# Patient Record
Sex: Female | Born: 1945
Health system: Southern US, Community
[De-identification: ages and names within clinical notes are randomized; demographics above are authoritative.]

## PROBLEM LIST (undated history)

## (undated) DIAGNOSIS — M797 Fibromyalgia: Secondary | ICD-10-CM

## (undated) DIAGNOSIS — C4491 Basal cell carcinoma of skin, unspecified: Secondary | ICD-10-CM

## (undated) DIAGNOSIS — H269 Unspecified cataract: Secondary | ICD-10-CM

## (undated) DIAGNOSIS — I351 Nonrheumatic aortic (valve) insufficiency: Secondary | ICD-10-CM

## (undated) DIAGNOSIS — F32A Depression, unspecified: Secondary | ICD-10-CM

## (undated) DIAGNOSIS — H3530 Unspecified macular degeneration: Secondary | ICD-10-CM

## (undated) DIAGNOSIS — K5790 Diverticulosis of intestine, part unspecified, without perforation or abscess without bleeding: Secondary | ICD-10-CM

## (undated) DIAGNOSIS — F419 Anxiety disorder, unspecified: Secondary | ICD-10-CM

## (undated) DIAGNOSIS — F329 Major depressive disorder, single episode, unspecified: Secondary | ICD-10-CM

## (undated) DIAGNOSIS — T7840XA Allergy, unspecified, initial encounter: Secondary | ICD-10-CM

## (undated) DIAGNOSIS — I639 Cerebral infarction, unspecified: Secondary | ICD-10-CM

## (undated) DIAGNOSIS — M1990 Unspecified osteoarthritis, unspecified site: Secondary | ICD-10-CM

## (undated) DIAGNOSIS — I10 Essential (primary) hypertension: Secondary | ICD-10-CM

## (undated) DIAGNOSIS — C439 Malignant melanoma of skin, unspecified: Secondary | ICD-10-CM

## (undated) DIAGNOSIS — E079 Disorder of thyroid, unspecified: Secondary | ICD-10-CM

## (undated) DIAGNOSIS — IMO0001 Reserved for inherently not codable concepts without codable children: Secondary | ICD-10-CM

## (undated) DIAGNOSIS — D126 Benign neoplasm of colon, unspecified: Secondary | ICD-10-CM

## (undated) DIAGNOSIS — K219 Gastro-esophageal reflux disease without esophagitis: Secondary | ICD-10-CM

## (undated) DIAGNOSIS — R011 Cardiac murmur, unspecified: Secondary | ICD-10-CM

## (undated) DIAGNOSIS — E785 Hyperlipidemia, unspecified: Secondary | ICD-10-CM

## (undated) HISTORY — DX: Benign neoplasm of colon, unspecified: D12.6

## (undated) HISTORY — DX: Unspecified macular degeneration: H35.30

## (undated) HISTORY — DX: Nonrheumatic aortic (valve) insufficiency: I35.1

## (undated) HISTORY — PX: EYE SURGERY: SHX253

## (undated) HISTORY — DX: Malignant melanoma of skin, unspecified: C43.9

## (undated) HISTORY — DX: Reserved for inherently not codable concepts without codable children: IMO0001

## (undated) HISTORY — PX: COLONOSCOPY: SHX174

## (undated) HISTORY — PX: NASAL SEPTUM SURGERY: SHX37

## (undated) HISTORY — DX: Unspecified osteoarthritis, unspecified site: M19.90

## (undated) HISTORY — PX: APPENDECTOMY: SHX54

## (undated) HISTORY — DX: Cardiac murmur, unspecified: R01.1

## (undated) HISTORY — PX: MELANOMA EXCISION: SHX5266

## (undated) HISTORY — DX: Cerebral infarction, unspecified: I63.9

## (undated) HISTORY — PX: LASIK: SHX215

## (undated) HISTORY — DX: Basal cell carcinoma of skin, unspecified: C44.91

## (undated) HISTORY — DX: Major depressive disorder, single episode, unspecified: F32.9

## (undated) HISTORY — DX: Diverticulosis of intestine, part unspecified, without perforation or abscess without bleeding: K57.90

## (undated) HISTORY — DX: Hyperlipidemia, unspecified: E78.5

## (undated) HISTORY — PX: BASAL CELL CARCINOMA EXCISION: SHX1214

## (undated) HISTORY — DX: Unspecified cataract: H26.9

## (undated) HISTORY — DX: Essential (primary) hypertension: I10

## (undated) HISTORY — PX: TUBAL LIGATION: SHX77

## (undated) HISTORY — DX: Gastro-esophageal reflux disease without esophagitis: K21.9

## (undated) HISTORY — DX: Disorder of thyroid, unspecified: E07.9

## (undated) HISTORY — PX: CHOLECYSTECTOMY: SHX55

## (undated) HISTORY — DX: Fibromyalgia: M79.7

## (undated) HISTORY — DX: Allergy, unspecified, initial encounter: T78.40XA

## (undated) HISTORY — PX: CARDIAC CATHETERIZATION: SHX172

## (undated) HISTORY — PX: ABDOMINAL HYSTERECTOMY: SHX81

## (undated) HISTORY — DX: Anxiety disorder, unspecified: F41.9

## (undated) HISTORY — DX: Depression, unspecified: F32.A

---

## 2006-02-13 DIAGNOSIS — I712 Thoracic aortic aneurysm, without rupture: Secondary | ICD-10-CM | POA: Insufficient documentation

## 2006-09-11 ENCOUNTER — Emergency Department (HOSPITAL_COMMUNITY): Admission: EM | Admit: 2006-09-11 | Discharge: 2006-09-12 | Payer: Self-pay | Admitting: Emergency Medicine

## 2006-11-29 ENCOUNTER — Ambulatory Visit: Payer: Self-pay | Admitting: Internal Medicine

## 2006-11-29 LAB — CONVERTED CEMR LAB
T3, Free: 3.1 pg/mL (ref 2.3–4.2)
TSH: 0.53 microintl units/mL (ref 0.35–5.50)

## 2006-12-12 ENCOUNTER — Encounter: Payer: Self-pay | Admitting: Cardiology

## 2006-12-12 ENCOUNTER — Encounter: Payer: Self-pay | Admitting: Internal Medicine

## 2006-12-12 ENCOUNTER — Ambulatory Visit: Payer: Self-pay

## 2006-12-19 ENCOUNTER — Ambulatory Visit: Payer: Self-pay | Admitting: Gastroenterology

## 2006-12-30 ENCOUNTER — Ambulatory Visit: Payer: Self-pay | Admitting: Internal Medicine

## 2007-04-30 ENCOUNTER — Ambulatory Visit: Payer: Self-pay | Admitting: Internal Medicine

## 2007-04-30 DIAGNOSIS — I10 Essential (primary) hypertension: Secondary | ICD-10-CM | POA: Insufficient documentation

## 2007-04-30 DIAGNOSIS — E039 Hypothyroidism, unspecified: Secondary | ICD-10-CM | POA: Insufficient documentation

## 2007-04-30 LAB — CONVERTED CEMR LAB
T3 Uptake Ratio: 40.6 % — ABNORMAL HIGH (ref 22.5–37.0)
TSH: 0.04 microintl units/mL — ABNORMAL LOW (ref 0.35–5.50)

## 2007-07-03 ENCOUNTER — Ambulatory Visit: Payer: Self-pay | Admitting: Internal Medicine

## 2007-07-03 DIAGNOSIS — B370 Candidal stomatitis: Secondary | ICD-10-CM | POA: Insufficient documentation

## 2007-07-03 DIAGNOSIS — R5381 Other malaise: Secondary | ICD-10-CM | POA: Insufficient documentation

## 2007-07-03 DIAGNOSIS — R5383 Other fatigue: Secondary | ICD-10-CM | POA: Insufficient documentation

## 2007-07-03 DIAGNOSIS — F329 Major depressive disorder, single episode, unspecified: Secondary | ICD-10-CM | POA: Insufficient documentation

## 2007-07-03 LAB — CONVERTED CEMR LAB
Folate: 7.1 ng/mL
HCT: 38.8 % (ref 36.0–46.0)
Hemoglobin: 13.1 g/dL (ref 12.0–15.0)
Lymphocytes Relative: 13.9 % (ref 12.0–46.0)
MCHC: 33.6 g/dL (ref 30.0–36.0)
Monocytes Absolute: 0.3 10*3/uL (ref 0.2–0.7)
Neutro Abs: 9.9 10*3/uL — ABNORMAL HIGH (ref 1.4–7.7)
RBC: 4.77 M/uL (ref 3.87–5.11)
Vitamin B-12: 352 pg/mL (ref 211–911)
WBC: 12.1 10*3/uL — ABNORMAL HIGH (ref 4.5–10.5)

## 2007-07-07 ENCOUNTER — Telehealth: Payer: Self-pay | Admitting: *Deleted

## 2007-07-10 ENCOUNTER — Ambulatory Visit: Payer: Self-pay | Admitting: Internal Medicine

## 2007-07-10 DIAGNOSIS — D510 Vitamin B12 deficiency anemia due to intrinsic factor deficiency: Secondary | ICD-10-CM | POA: Insufficient documentation

## 2007-08-25 ENCOUNTER — Encounter: Payer: Self-pay | Admitting: Internal Medicine

## 2008-01-05 ENCOUNTER — Ambulatory Visit: Payer: Self-pay | Admitting: Vascular Surgery

## 2008-01-05 ENCOUNTER — Encounter: Payer: Self-pay | Admitting: Family Medicine

## 2008-01-05 ENCOUNTER — Ambulatory Visit (HOSPITAL_COMMUNITY): Admission: RE | Admit: 2008-01-05 | Discharge: 2008-01-05 | Payer: Self-pay | Admitting: Family Medicine

## 2008-01-17 ENCOUNTER — Encounter: Payer: Self-pay | Admitting: Family Medicine

## 2008-01-17 ENCOUNTER — Ambulatory Visit (HOSPITAL_COMMUNITY): Admission: RE | Admit: 2008-01-17 | Discharge: 2008-01-17 | Payer: Self-pay | Admitting: Family Medicine

## 2008-01-20 ENCOUNTER — Ambulatory Visit (HOSPITAL_COMMUNITY): Admission: RE | Admit: 2008-01-20 | Discharge: 2008-01-20 | Payer: Self-pay | Admitting: Orthopedic Surgery

## 2008-02-10 ENCOUNTER — Encounter (INDEPENDENT_AMBULATORY_CARE_PROVIDER_SITE_OTHER): Payer: Self-pay | Admitting: Orthopedic Surgery

## 2008-02-10 ENCOUNTER — Ambulatory Visit: Payer: Self-pay | Admitting: Surgery

## 2008-02-10 ENCOUNTER — Ambulatory Visit: Admission: RE | Admit: 2008-02-10 | Discharge: 2008-02-10 | Payer: Self-pay | Admitting: Orthopedic Surgery

## 2008-02-20 ENCOUNTER — Encounter: Admission: RE | Admit: 2008-02-20 | Discharge: 2008-02-20 | Payer: Self-pay | Admitting: Rheumatology

## 2009-02-14 ENCOUNTER — Encounter: Payer: Self-pay | Admitting: Cardiovascular Disease

## 2009-02-14 ENCOUNTER — Ambulatory Visit: Payer: Self-pay | Admitting: Cardiovascular Disease

## 2009-02-14 DIAGNOSIS — R9431 Abnormal electrocardiogram [ECG] [EKG]: Secondary | ICD-10-CM | POA: Insufficient documentation

## 2009-02-14 DIAGNOSIS — I359 Nonrheumatic aortic valve disorder, unspecified: Secondary | ICD-10-CM | POA: Insufficient documentation

## 2009-02-14 LAB — CONVERTED CEMR LAB
CO2: 31 meq/L (ref 19–32)
Calcium: 9.1 mg/dL (ref 8.4–10.5)
Chloride: 105 meq/L (ref 96–112)
Creatinine, Ser: 0.7 mg/dL (ref 0.4–1.2)
Glucose, Bld: 89 mg/dL (ref 70–99)

## 2009-02-22 ENCOUNTER — Ambulatory Visit: Payer: Self-pay | Admitting: Cardiovascular Disease

## 2009-02-22 ENCOUNTER — Ambulatory Visit: Payer: Self-pay

## 2009-02-22 ENCOUNTER — Ambulatory Visit (HOSPITAL_COMMUNITY): Admission: RE | Admit: 2009-02-22 | Discharge: 2009-02-22 | Payer: Self-pay | Admitting: Cardiovascular Disease

## 2009-02-22 ENCOUNTER — Encounter: Payer: Self-pay | Admitting: Cardiovascular Disease

## 2009-11-24 ENCOUNTER — Encounter: Payer: Self-pay | Admitting: Internal Medicine

## 2009-12-29 ENCOUNTER — Encounter (INDEPENDENT_AMBULATORY_CARE_PROVIDER_SITE_OTHER): Payer: Self-pay | Admitting: *Deleted

## 2010-03-07 ENCOUNTER — Encounter: Payer: Self-pay | Admitting: Internal Medicine

## 2010-03-21 ENCOUNTER — Ambulatory Visit: Payer: Self-pay | Admitting: Cardiovascular Disease

## 2010-04-03 ENCOUNTER — Encounter: Admission: RE | Admit: 2010-04-03 | Discharge: 2010-04-03 | Payer: Self-pay | Admitting: Family Medicine

## 2010-04-26 ENCOUNTER — Encounter: Admission: RE | Admit: 2010-04-26 | Discharge: 2010-04-26 | Payer: Self-pay | Admitting: Family Medicine

## 2010-05-12 ENCOUNTER — Ambulatory Visit (HOSPITAL_COMMUNITY): Admission: RE | Admit: 2010-05-12 | Discharge: 2010-05-12 | Payer: Self-pay | Admitting: Family Medicine

## 2010-05-12 ENCOUNTER — Encounter: Payer: Self-pay | Admitting: Cardiovascular Disease

## 2010-05-17 ENCOUNTER — Telehealth: Payer: Self-pay | Admitting: Cardiovascular Disease

## 2010-09-27 ENCOUNTER — Encounter: Payer: Self-pay | Admitting: Cardiovascular Disease

## 2010-10-04 ENCOUNTER — Encounter (INDEPENDENT_AMBULATORY_CARE_PROVIDER_SITE_OTHER): Payer: Self-pay | Admitting: *Deleted

## 2010-10-04 ENCOUNTER — Telehealth: Payer: Self-pay | Admitting: Cardiovascular Disease

## 2010-10-29 DIAGNOSIS — D126 Benign neoplasm of colon, unspecified: Secondary | ICD-10-CM

## 2010-10-29 HISTORY — DX: Benign neoplasm of colon, unspecified: D12.6

## 2010-11-28 NOTE — Assessment & Plan Note (Signed)
Summary: E4V   Primary Provider:  Dr. Dallas Schimke  CC:  dizziness.  History of Present Illness: Katherine Wang is seen today for F/U of HTN, AV disease and mild thoracic aortic root dilatation.  MRI done 4/10 showed thoracic aorta around 4 cm.  There was a ? of bicuspid valve but she had 3 leaflets on MRI.;  She has mild AR.  Her BP seems to be running high.  She just got a BP cuff at home and will check it regularly.  She takes Diovan 160mg  and hets dizzy and lightheaded on 320mg .  She denies SSCP, palpitations, dyspnea or TIA.  She is compliant with her meds.  I told her if her BP runs high over the next 3 weeks at home we could add another medicine.  I would probably choose Norvasc 10 mg.  Otherwise she will not need another echo or MRA until 2012  Current Problems (verified): 1)  Aortic Insufficiency  (ICD-424.1) 2)  Electrocardiogram, Abnormal  (ICD-794.31) 3)  Pernicious Anemia  (ICD-281.0) 4)  Depression  (ICD-311) 5)  Malaise and Fatigue  (ICD-780.79) 6)  Candidiasis, Oral  (ICD-112.0) 7)  Aneurysm, Thoracic Aortic  (ICD-441.2) 8)  Hypothyroidism  (ICD-244.9) 9)  Hypertension  (ICD-401.9)  Current Medications (verified): 1)  Diovan 160 Mg Tabs (Valsartan) .... Take 1 By Mouth Once Daily 2)  Klonopin 0.5 Mg  Tabs (Clonazepam) .... At Bedtime 3)  Ocuvite Adult Formula   Caps (Multiple Vitamins-Minerals) .... Once Daily 4)  Zoloft 100 Mg  Tabs (Sertraline Hcl) .... 2 Tabs By Mouth Once Daily 5)  Zyrtec-D Allergy & Congestion 5-120 Mg Xr12h-Tab (Cetirizine-Pseudoephedrine) 6)  Synthroid 100 Mcg Tabs (Levothyroxine Sodium) .Marland Kitchen.. 1  Tab By Mouth Once Daily 7)  Hydroxychloroquine Sulfate 200 Mg Tabs (Hydroxychloroquine Sulfate) .... Take 1 By Mouth Two Times A Day 8)  Folic Acid 1 Mg Tabs (Folic Acid) .... Take 2 By Mouth Once Daily 9)  Crestor 10 Mg Tabs (Rosuvastatin Calcium) .... Take 1 By Mouth Once Daily 10)  Methotrexate Sodium 25 Mg/ml Soln (Methotrexate Sodium) .... Inject 0.8 Ml Under Skin  Every Week 11)  Proair Hfa 108 (90 Base) Mcg/act Aers (Albuterol Sulfate) .... As Needed 12)  Bupropion Hcl 100 Mg Tabs (Bupropion Hcl) .Marland Kitchen.. 1 Tab By Mouth Once Daily  Allergies (verified): No Known Drug Allergies  Past History:  Past Medical History: Last updated: 02/10/2009 Current Problems:  PERNICIOUS ANEMIA (ICD-281.0) DEPRESSION (ICD-311) MALAISE AND FATIGUE (ICD-780.79) CANDIDIASIS, ORAL (ICD-112.0) ANEURYSM, THORACIC AORTIC (ICD-441.2) HYPOTHYROIDISM (ICD-244.9) HYPERTENSION (ICD-401.9)  Past Surgical History: Last updated: 04/30/2007 Hysterectomy  Family History: Last updated: 02/10/2009 noncontributory  Social History: Last updated: 02/14/2009 Divorced.  Lives alone 2 daughters living one local one in [place].  Son died of brain aneurysm at age 32 Never Smoked Non-drinker Primary CAre Dr. now Dallas Schimke at urgent care  Review of Systems       Denies fever, malais, weight loss, blurry vision, decreased visual acuity, cough, sputum, SOB, hemoptysis, pleuritic pain, palpitaitons, heartburn, abdominal pain, melena, lower extremity edema, claudication, or rash.   Vital Signs:  Patient profile:   65 year old female Height:      61 inches Weight:      143 pounds BMI:     27.12 Pulse rate:   80 / minute Resp:     14 per minute BP sitting:   160 / 90  (left arm)  Vitals Entered By: Kem Parkinson (Mar 21, 2010 3:09 PM)  Physical Exam  General:  Affect  appropriate Healthy:  appears stated age HEENT: normal Neck supple with no adenopathy JVP normal no bruits no thyromegaly Lungs clear with no wheezing and good diaphragmatic motion Heart:  S1/S2 no murmur,rub, gallop or click PMI normal Abdomen: benighn, BS positve, no tenderness, no AAA no bruit.  No HSM or HJR Distal pulses intact with no bruits No edema Neuro non-focal Skin warm and dry    Impression & Recommendations:  Problem # 1:  AORTIC INSUFFICIENCY (ICD-424.1) No diastolic murmur on  exam.  Consider echo in a year.  No SBE prophylaxis Her updated medication list for this problem includes:    Diovan 160 Mg Tabs (Valsartan) .Marland Kitchen... Take 1 by mouth once daily  Problem # 2:  ANEURYSM, THORACIC AORTIC (ICD-441.2) Only 4.0 cm with no bicuspid valve.  F/U MRA in a year  Problem # 3:  HYPERTENSION (ICD-401.9) Will likely need addition of norvasc.  Alternative would be to stop Diovan and try Cozaar 100mg  plus/minus diuretic if she wants just one pill She will let us know what her home readings are Her updated medication list for this problem includes:    Diovan 160 Mg Tabs (Valsartan) .Marland Kitchen... Take 1 by mouth once daily  Problem # 4:  ELECTROCARDIOGRAM, ABNORMAL (ICD-794.31) PVC on ECG benign.  Nonspecific ST/T wave changes unchanged and likely reflect HTN  Patient Instructions: 1)  Your physician recommends that you schedule a follow-up appointment in: ONE YEAR   EKG Report  Procedure date:  03/21/2010  Findings:      NSR 80 Inferolateral ST/T wave changes Abnormal ECG

## 2010-11-28 NOTE — Letter (Signed)
Summary: [facility]  [facility]   Imported By: Maryln Gottron 03/23/2010 13:51:17  _____________________________________________________________________  External Attachment:    Type:   Image     Comment:   External Document

## 2010-11-28 NOTE — Progress Notes (Signed)
Summary: surgical clearance  Phone Note Other Incoming   Caller: nurse robin Summary of Call: Per Zella Ball pt needs clearance for surgery letter was sent but needs more information also needs old ekg. [phone] fax [phone] Initial call taken by: Edman Circle,  October 04, 2010 10:38 AM  Follow-up for Phone Call        Left message to call back Deliah Goody, RN  October 04, 2010 2:54 PM  spoke with robin, she request clearence letter and old EKG. sent to fax number provided. Deliah Goody, RN  October 04, 2010 4:24 PM

## 2010-11-28 NOTE — Letter (Signed)
Summary: Clearance Letter  [facility], Main Office  [address]   [place], [place] [address]   Phone: [phone]  Fax: [phone]    October 04, 2010  Re:     Crimora Cellar Address:   [address]     [place], [place]  [address] DOB:     Mar 06, 1946 MRN:     [id number]   Dear Dr Petra Kuba,       [name] is low risk cardiac wise for surgery. Please call with any questions or concerns.    Sincerely,  Katherine Goody, RN/Dr Charlton Haws

## 2010-11-28 NOTE — Letter (Signed)
Summary: [facility]  [facility]   Imported By: Marylou Mccoy 10/06/2010 17:50:24  _____________________________________________________________________  External Attachment:    Type:   Image     Comment:   External Document

## 2010-11-28 NOTE — Letter (Signed)
Summary: [facility]  [facility]   Imported By: Lanelle Bal 12/06/2009 11:50:34  _____________________________________________________________________  External Attachment:    Type:   Image     Comment:   External Document

## 2010-11-28 NOTE — Letter (Signed)
Summary: Appointment - Reminder 2  [facility], Main Office  [address]   [place], [place] [address]   Phone: [phone]  Fax: [phone]     December 29, 2009 MRN: [id number]   Katherine Wang [address] [place], [place]  [address]   Dear Ms. [name],  Our records indicate that it is time to schedule a follow-up appointment with Dr. Eden Emms. It is very important that we reach you to schedule this appointment. We look forward to participating in your health care needs. Please contact us at the number listed above at your earliest convenience to schedule your appointment.  If you are unable to make an appointment at this time, give [place] a call so we can update our records.     Sincerely,   Migdalia Dk [facility] Scheduling Team

## 2010-11-28 NOTE — Progress Notes (Signed)
Summary: returning call  Phone Note Call from Patient   Caller: Patient Reason for Call: Talk to Nurse Summary of Call: pt returning call -pls call [phone] Initial call taken by: Glynda Jaeger,  May 17, 2010 9:39 AM  Follow-up for Phone Call        Phone Call Completed PT AWARE OF CT RESULTS. Follow-up by: Scherrie Bateman, LPN,  May 17, 2010 11:55 AM

## 2011-01-15 ENCOUNTER — Encounter (INDEPENDENT_AMBULATORY_CARE_PROVIDER_SITE_OTHER): Payer: Self-pay | Admitting: *Deleted

## 2011-01-25 NOTE — Letter (Signed)
Summary: Pre Visit Letter Revised  [facility]  [address] [place], [place] [address]   Phone: [phone]  Fax: [phone]        01/15/2011 MRN: [id number] Katherine Wang [address] [place], [place]  [address]             Procedure Date:  02-28-11           Direct Colon---Dr. Russella Dar  Welcome to the Gastroenterology Division at [facility]    You are scheduled to see a nurse for your pre-procedure visit on 02-15-11 at 9:00a.m. on the 3rd floor at [facility], [address]  We ask that you try to arrive at our office 15 minutes prior to your appointment time to allow for check-in.  Please take a minute to review the attached form.  If you answer "Yes" to one or more of the questions on the first page, we ask that you call the person listed at your earliest opportunity.  If you answer "No" to all of the questions, please complete the rest of the form and bring it to your appointment.    Your nurse visit will consist of discussing your medical and surgical history, your immediate family medical history, and your medications.   If you are unable to list all of your medications on the form, please bring the medication bottles to your appointment and we will list them.  We will need to be aware of both prescribed and over the counter drugs.  We will need to know exact dosage information as well.    Please be prepared to read and sign documents such as consent forms, a financial agreement, and acknowledgement forms.  If necessary, and with your consent, a friend or relative is welcome to sit-in on the nurse visit with you.  Please bring your insurance card so that we may make a copy of it.  If your insurance requires a referral to see a specialist, please bring your referral form from your primary care physician.  No co-pay is required for this nurse visit.     If you cannot keep your appointment, please call [phone] to cancel or reschedule prior to your  appointment date.  This allows [place] the opportunity to schedule an appointment for another patient in need of care.    Thank you for choosing [facility] for your medical needs.  We appreciate the opportunity to care for you.  Please visit [place] at our website  to learn more about our practice.  Sincerely, The Gastroenterology Division

## 2011-02-15 ENCOUNTER — Ambulatory Visit (AMBULATORY_SURGERY_CENTER): Payer: Medicare Other | Admitting: *Deleted

## 2011-02-15 VITALS — Ht 61.0 in | Wt 147.0 lb

## 2011-02-15 DIAGNOSIS — Z1211 Encounter for screening for malignant neoplasm of colon: Secondary | ICD-10-CM

## 2011-02-15 MED ORDER — PEG-KCL-NACL-NASULF-NA ASC-C 100 G PO SOLR: 1.0000 | Freq: Once | ORAL | Status: AC

## 2011-02-28 ENCOUNTER — Encounter: Payer: Self-pay | Admitting: Gastroenterology

## 2011-02-28 ENCOUNTER — Ambulatory Visit (AMBULATORY_SURGERY_CENTER): Payer: Medicare Other | Admitting: Gastroenterology

## 2011-02-28 DIAGNOSIS — R198 Other specified symptoms and signs involving the digestive system and abdomen: Secondary | ICD-10-CM

## 2011-02-28 DIAGNOSIS — D126 Benign neoplasm of colon, unspecified: Secondary | ICD-10-CM

## 2011-02-28 DIAGNOSIS — Z1211 Encounter for screening for malignant neoplasm of colon: Secondary | ICD-10-CM

## 2011-02-28 DIAGNOSIS — K5730 Diverticulosis of large intestine without perforation or abscess without bleeding: Secondary | ICD-10-CM

## 2011-02-28 MED ORDER — SODIUM CHLORIDE 0.9 % IV SOLN: 500.0000 mL | INTRAVENOUS | Status: DC

## 2011-02-28 NOTE — Patient Instructions (Signed)
Information on polyps, diverticulosis, high fiber diet given. Resume all medications.

## 2011-03-01 ENCOUNTER — Telehealth: Payer: Self-pay

## 2011-03-01 NOTE — Telephone Encounter (Signed)
I called hm # and got the answering machine but it had no ID.  No message left.  MAW

## 2011-03-05 ENCOUNTER — Encounter: Payer: Self-pay | Admitting: Gastroenterology

## 2011-04-19 ENCOUNTER — Telehealth: Payer: Self-pay | Admitting: Cardiology

## 2011-04-19 NOTE — Telephone Encounter (Signed)
I talked with pt. I have given pt an appointment with Dr Shirlee Latch 04/24/11 at 8:45am.

## 2011-04-19 NOTE — Telephone Encounter (Signed)
That is fine.  Sounds like she needs to be seen soon.  Please have her come in in the next week or so.

## 2011-04-19 NOTE — Telephone Encounter (Signed)
Spoke with pt,  The pts primary care md, dr Shanda Bumps [place], suggested dr Shirlee Latch to her. She did not realize that mclean and nishan were in the same practice. She is having trouble with pain throughout her chest when she lies on her right side and occ will get the discomfort while sitting. She would like to change to dr Shirlee Latch if okay with him. Will forward for dr mclean's review Deliah Goody

## 2011-04-19 NOTE — Telephone Encounter (Signed)
Pt would like to changed service to see dr. Shirlee Latch.

## 2011-04-24 ENCOUNTER — Ambulatory Visit (INDEPENDENT_AMBULATORY_CARE_PROVIDER_SITE_OTHER): Payer: Medicare Other | Admitting: Cardiology

## 2011-04-24 ENCOUNTER — Encounter: Payer: Self-pay | Admitting: Cardiology

## 2011-04-24 DIAGNOSIS — R0602 Shortness of breath: Secondary | ICD-10-CM

## 2011-04-24 DIAGNOSIS — I359 Nonrheumatic aortic valve disorder, unspecified: Secondary | ICD-10-CM

## 2011-04-24 DIAGNOSIS — I10 Essential (primary) hypertension: Secondary | ICD-10-CM

## 2011-04-24 DIAGNOSIS — I319 Disease of pericardium, unspecified: Secondary | ICD-10-CM

## 2011-04-24 DIAGNOSIS — R079 Chest pain, unspecified: Secondary | ICD-10-CM

## 2011-04-24 DIAGNOSIS — I712 Thoracic aortic aneurysm, without rupture: Secondary | ICD-10-CM

## 2011-04-24 LAB — BASIC METABOLIC PANEL
CO2: 28 mEq/L (ref 19–32)
Chloride: 106 mEq/L (ref 96–112)
Potassium: 4.8 mEq/L (ref 3.5–5.1)

## 2011-04-24 LAB — SEDIMENTATION RATE: Sed Rate: 83 mm/hr — ABNORMAL HIGH (ref 0–22)

## 2011-04-24 MED ORDER — POTASSIUM CHLORIDE CRYS ER 20 MEQ PO TBCR: 20.0000 meq | EXTENDED_RELEASE_TABLET | Freq: Two times a day (BID) | ORAL | Status: DC

## 2011-04-24 MED ORDER — VALSARTAN 160 MG PO TABS: 160.0000 mg | ORAL_TABLET | Freq: Every day | ORAL | Status: DC

## 2011-04-24 MED ORDER — IBUPROFEN 400 MG PO TABS: ORAL_TABLET | ORAL | Status: DC

## 2011-04-24 MED ORDER — COLCHICINE 0.6 MG PO TABS: ORAL_TABLET | ORAL | Status: DC

## 2011-04-24 MED ORDER — CHLORTHALIDONE 25 MG PO TABS: ORAL_TABLET | ORAL | Status: DC

## 2011-04-24 MED ORDER — OMEPRAZOLE 20 MG PO CPDR: DELAYED_RELEASE_CAPSULE | ORAL | Status: DC

## 2011-04-24 NOTE — Assessment & Plan Note (Signed)
Getting echo for workup of pericarditis.  Will reassess aortic valve pathology.  Mild AI in the past.   Followup in office in 2 wks.

## 2011-04-24 NOTE — Patient Instructions (Addendum)
Start Chlorthalidone 12.5mg  daily--this will be one-half of a 25mg  tablet.  Start KCL(potassium) 20 mEq daily.  Start Colchicine 0.6mg  daily for 3 months.  Start Ibuprofen 400mg  three times a day for 2 weeks.  Take Omeprazole 20mg  daily for 2 weeks while you are taking Ibuprofen.  Lab to day--BMP/BNP/Sed rate  423.9  786.50  401.9  Schedule an appointment for an echocardiogram 423.9  Schedule an appointment to see Dr Shirlee Latch in 2 weeks..  Lab in 2 weeks--BMP 786.50  423.9

## 2011-04-24 NOTE — Assessment & Plan Note (Signed)
Mildly dilated ascending aorta on 7/11 CTA chest.  Would repeat in 7/13 with MR angiogram.  Need good BP control to decrease risk of progression.  

## 2011-04-24 NOTE — Assessment & Plan Note (Signed)
Patient has had chest pain for several weeks that is both positional and pleuritic.  It is not present constantly.  She has a history prior acute pericarditis.  Rheumatoid arthritis can certainly predispose her to pericarditis.  I will check ESR and BNP.  I will start her on Ibuprofen 400 mg tid x 2 weeks.  She will take omeprazole 20 mg daily while taking Ibuprofen.  I will also have her take colchicine 0.6 mg daily x 3 months given possible recurrent acute pericarditis.  I will get an echocardiogram to look for pericardial effusion, etc.

## 2011-04-24 NOTE — Assessment & Plan Note (Signed)
BP is running high.  She has had trouble with increasing valsartan in the past.  Instead, I will have her add chlorthalidone 12.5 mg daily + KCl 20 mEq daily with BMET in 2 weeks.

## 2011-04-24 NOTE — Progress Notes (Signed)
PCP: Dr. Shanda Bumps [place]  65 yo with history of rheumatoid arthritis, aortic insufficiency, mild ascending aortic dilatation, and possible prior acute pericarditis presents for evaluation of chest pain.  I am seeing her for the first time today.  She has been seen by Dr. Eden Emms in the past.  Patient's cardiac history begins in 2004 when she had possible acute pericarditis in [place].  LHC at that time showed no significant disease.  In our office, she has been followed for a malformed (though trileaflet) aortic valve.  She has had mild aortic insufficiency and a mildly dilated ascending aorta.    For the last month, patient has noted that her chest hurts when she lies down on her right side.  Sometimes the pain will occur when she sits upright. The pain is also worse with deep breathing.  It does seem somewhat like prior pericarditis pain.  It has actually improved some over the past few days but has not resolved.  No exertional chest pain.  Patient has had long-standing exertional dyspnea.  She is short of breath walking up steps or rolling the garbage can to the street.  This pattern has not changed.    BP is high today and has been running high at home.  Patient states that last time her valsartan was increased, she got very lightheaded.    ECG: NSR, < 1 mm inferior ST depression, < 1 mm anterior ST depression (similar to prior)  PMH: 1. Acute pericarditis in 2004: Hospitalized in [place].  Had LHC at that time that she reports as showing no significant CAD.  2. 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  Last echo (4/10) showed EF 55-60%, mild LV dilation, mild LV hypertrophy, mild aortic insufficiency, mild MR.  Cardiac MRI (4/10) confirmed that the aortic valve was trileaflet but malformed, EF 64%.  3.  Ascending aorta dilation: MRA chest (4/10) with 4 x 3.6 cm ascending aorta.  CTA chest (7/11) with 3.7 x 3.7 cm ascending aorta and aberrant right subclavian  course.  4.  HTN 5.  Rheumatoid arthritis  6.  H/o rheumatic fever in childhood.   SH: Lives alone, single.  Divorced, 2 daughters.  Works a Armed forces training and education officer.  Quit smoking in the 1980s.    FH: Mother with angina.   Father with MI at 53.    ROS: All systems reviewed and negative except as per HPI.   Current Outpatient Prescriptions  Medication Sig Dispense Refill  . buPROPion (WELLBUTRIN) 100 MG tablet Take 100 mg by mouth daily.        . Calcium-Vitamin D (CALTRATE 600 PLUS-VIT D PO) Take 1 capsule by mouth daily.        . cetirizine-pseudoephedrine (ZYRTEC-D) 5-120 MG per tablet Take 1 tablet by mouth as needed.        . clonazePAM (KLONOPIN) 0.5 MG tablet Take 0.5 mg by mouth 2 (two) times daily as needed.        . folic acid (FOLVITE) 1 MG tablet Take 2 mg by mouth daily.       . hydroxychloroquine (PLAQUENIL) 200 MG tablet Take 200 mg by mouth 2 (two) times daily.        Marland Kitchen levothyroxine (SYNTHROID, LEVOTHROID) 112 MCG tablet Take 112 mcg by mouth daily.        . methotrexate (RHEUMATREX) 2.5 MG tablet Take 2.5 mg by mouth. Caution:Chemotherapy. Protect from light.  8 tablets once weekly       .  Multiple Vitamins-Minerals (OCUVITE ADULT FORMULA PO) Take 1 tablet by mouth.        . naproxen sodium (ANAPROX) 220 MG tablet Take 220 mg by mouth as needed.        . rosuvastatin (CRESTOR) 10 MG tablet Take 10 mg by mouth daily.        . sertraline (ZOLOFT) 100 MG tablet Take 200 mg by mouth daily.        . valsartan (DIOVAN) 160 MG tablet Take 1 tablet (160 mg total) by mouth daily.  30 tablet  6  . DISCONTD: valsartan (DIOVAN) 160 MG tablet Take 160 mg by mouth daily.        . chlorthalidone (HYGROTON) 25 MG tablet Take 1/2 tablet daily  15 tablet  6  . colchicine 0.6 MG tablet Take one daily for 3 months  30 tablet  2  . ibuprofen (ADVIL,MOTRIN) 400 MG tablet Take one tablet three times a day for 2 weeks  42 tablet  0  . omeprazole (PRILOSEC) 20 MG capsule Take one daily for 2 weeks  while you are taking Ibuprofen  14 capsule  0  . potassium chloride SA (KLOR-CON M20) 20 MEQ tablet Take 1 tablet (20 mEq total) by mouth 2 (two) times daily.  30 tablet  6  . DISCONTD: clonazePAM (KLONOPIN) 1 MG tablet Take 1 mg by mouth daily.        Marland Kitchen DISCONTD: methotrexate 1 G injection Inject 0.6 mg into the muscle once a week.        Marland Kitchen DISCONTD: MOVIPREP 100 G SOLR        Current Facility-Administered Medications  Medication Dose Route Frequency Provider Last Rate Last Dose  . 0.9 %  sodium chloride infusion  500 mL Intravenous Continuous Meryl Dare, MD,FACG        BP 142/97  Pulse 86  Ht 5\' 1"  (1.549 m)  Wt 146 lb (66.225 kg)  BMI 27.59 kg/m2 General: NAD Neck: No JVD, no thyromegaly or thyroid nodule.  Lungs: Clear to auscultation bilaterally with normal respiratory effort. CV: Nondisplaced PMI.  Heart regular S1/S2, no S3/S4, no murmur.  No peripheral edema.  No carotid bruit.  Normal pedal pulses.  Abdomen: Soft, nontender, no hepatosplenomegaly, no distention.  Skin: Intact without lesions or rashes.  Neurologic: Alert and oriented x 3.  Psych: Normal affect. Extremities: No clubbing or cyanosis.  HEENT: Normal.

## 2011-04-25 ENCOUNTER — Ambulatory Visit (HOSPITAL_COMMUNITY): Payer: Medicare Other | Attending: Cardiology | Admitting: Radiology

## 2011-04-25 DIAGNOSIS — R5383 Other fatigue: Secondary | ICD-10-CM | POA: Insufficient documentation

## 2011-04-25 DIAGNOSIS — R5381 Other malaise: Secondary | ICD-10-CM | POA: Insufficient documentation

## 2011-04-25 DIAGNOSIS — I080 Rheumatic disorders of both mitral and aortic valves: Secondary | ICD-10-CM | POA: Insufficient documentation

## 2011-04-25 DIAGNOSIS — I319 Disease of pericardium, unspecified: Secondary | ICD-10-CM

## 2011-04-25 DIAGNOSIS — R079 Chest pain, unspecified: Secondary | ICD-10-CM | POA: Insufficient documentation

## 2011-04-25 DIAGNOSIS — I059 Rheumatic mitral valve disease, unspecified: Secondary | ICD-10-CM

## 2011-04-25 DIAGNOSIS — I079 Rheumatic tricuspid valve disease, unspecified: Secondary | ICD-10-CM | POA: Insufficient documentation

## 2011-04-25 DIAGNOSIS — I10 Essential (primary) hypertension: Secondary | ICD-10-CM | POA: Insufficient documentation

## 2011-04-25 DIAGNOSIS — I359 Nonrheumatic aortic valve disorder, unspecified: Secondary | ICD-10-CM

## 2011-04-26 ENCOUNTER — Encounter: Payer: Self-pay | Admitting: *Deleted

## 2011-04-27 ENCOUNTER — Telehealth: Payer: Self-pay | Admitting: *Deleted

## 2011-04-27 NOTE — Telephone Encounter (Signed)
Pt returning call from [place] pt unsure of exactly who called her. Pt said person calling wanted to discuss echo with pt.

## 2011-04-27 NOTE — Telephone Encounter (Signed)
I talked with pt about recent echo results. 

## 2011-05-08 ENCOUNTER — Encounter: Payer: Self-pay | Admitting: Cardiology

## 2011-05-08 ENCOUNTER — Ambulatory Visit (INDEPENDENT_AMBULATORY_CARE_PROVIDER_SITE_OTHER): Payer: Medicare Other | Admitting: Cardiology

## 2011-05-08 ENCOUNTER — Other Ambulatory Visit (INDEPENDENT_AMBULATORY_CARE_PROVIDER_SITE_OTHER): Payer: Medicare Other | Admitting: *Deleted

## 2011-05-08 VITALS — BP 113/78 | HR 81 | Resp 16 | Ht 60.0 in | Wt 147.0 lb

## 2011-05-08 DIAGNOSIS — I712 Thoracic aortic aneurysm, without rupture, unspecified: Secondary | ICD-10-CM

## 2011-05-08 DIAGNOSIS — I10 Essential (primary) hypertension: Secondary | ICD-10-CM

## 2011-05-08 DIAGNOSIS — I359 Nonrheumatic aortic valve disorder, unspecified: Secondary | ICD-10-CM

## 2011-05-08 DIAGNOSIS — IMO0001 Reserved for inherently not codable concepts without codable children: Secondary | ICD-10-CM

## 2011-05-08 DIAGNOSIS — I319 Disease of pericardium, unspecified: Secondary | ICD-10-CM

## 2011-05-08 DIAGNOSIS — R079 Chest pain, unspecified: Secondary | ICD-10-CM

## 2011-05-08 LAB — BASIC METABOLIC PANEL
BUN: 15 mg/dL (ref 6–23)
CO2: 28 mEq/L (ref 19–32)
Chloride: 103 mEq/L (ref 96–112)
Creatinine, Ser: 0.9 mg/dL (ref 0.4–1.2)
GFR: 68.98 mL/min (ref >60.00)
Glucose, Bld: 91 mg/dL (ref 70–99)
Potassium: 4 mEq/L (ref 3.5–5.1)
Sodium: 136 mEq/L (ref 135–145)

## 2011-05-08 NOTE — Patient Instructions (Addendum)
Lab today---BMP 401.89  423.9  Your physician wants you to follow-up in: 6 months with Dr Shirlee Latch. (January 2013). You will receive a reminder letter in the mail two months in advance. If you don't receive a letter, please call our office to schedule the follow-up appointment.   MRA of chest in January 2013. Have this a few days before the appointment with Dr Shirlee Latch in January 2013.

## 2011-05-08 NOTE — Assessment & Plan Note (Signed)
Patient may have had recurrent acute pericarditis around the time of the last appointment.  Chest pain has now completely resolved.  No pericardial effusion on echo.  I will have her complete 3 months of colchicine.

## 2011-05-08 NOTE — Assessment & Plan Note (Signed)
Mildly dilated ascending aorta on 7/11 CTA chest.  Would repeat in 7/13 with MR angiogram.  Need good BP control to decrease risk of progression.

## 2011-05-08 NOTE — Assessment & Plan Note (Signed)
Echo 6/12 with mild aortic insufficiency.

## 2011-05-08 NOTE — Progress Notes (Signed)
PCP: Dr. Shanda Bumps Copland  65 yo with history of rheumatoid arthritis, aortic insufficiency, mild ascending aortic dilatation, and suspected prior acute pericarditis returns for evaluation of chest pain.  Patient's cardiac history begins in 2004 when she had possible acute pericarditis in [place].  LHC at that time showed no significant disease.  In our office, she has been followed for a malformed (though trileaflet) aortic valve.  She has had mild aortic insufficiency and a mildly dilated ascending aorta.  At last appointment, she was thought to possibly have symptoms of recurrent pericarditis.  ESR was significantly elevated though this could be due to her rheumatoid arthritis.    At last appointment, I started her on Ibuprofen and colchicine.  The Ibuprofen upset her stomach despite use of omeprazole so she stopped it.  She is still taking the colchicine.  The chest pain has resolved completely.  Patient has had long-standing exertional dyspnea.  She is short of breath walking up steps or rolling the garbage can to the street.  This pattern has not changed.  BP is under control now.   Finally, patient is thinking about moving to [place] to live nearer to her daughter.    Labs (6/12): BNP 86, ESR 83  PMH: 1. Acute pericarditis in 2004: Hospitalized in [place].  Had LHC at that time that she reports as showing no significant CAD.  Possible recurrent pericarditis 6/12.  2. 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  Last echo (4/10) showed EF 55-60%, mild LV dilation, mild LV hypertrophy, mild aortic insufficiency, mild MR.  Cardiac MRI (4/10) confirmed that the aortic valve was trileaflet but malformed, EF 64%. Echo (6/12): EF 50%, moderate (grade II) diastolic dysfunction, mild AI, mild MR, no pericardial effusion.   3.  Ascending aorta dilation: MRA chest (4/10) with 4 x 3.6 cm ascending aorta.  CTA chest (7/11) with 3.7 x 3.7 cm ascending aorta and aberrant right  subclavian course.  4.  HTN 5.  Rheumatoid arthritis  6.  H/o rheumatic fever in childhood.   SH: Lives alone, single.  Divorced, 2 daughters.  Works a Armed forces training and education officer.  Quit smoking in the 1980s.    FH: Mother with angina.   Father with MI at 13.    Current Outpatient Prescriptions  Medication Sig Dispense Refill  . buPROPion (WELLBUTRIN) 100 MG tablet Take 100 mg by mouth daily.        . Calcium-Vitamin D (CALTRATE 600 PLUS-VIT D PO) Take 1 capsule by mouth daily.        . cetirizine-pseudoephedrine (ZYRTEC-D) 5-120 MG per tablet Take 1 tablet by mouth as needed.        . chlorthalidone (HYGROTON) 25 MG tablet Take 1/2 tablet daily  15 tablet  6  . clonazePAM (KLONOPIN) 0.5 MG tablet Take 0.5 mg by mouth 2 (two) times daily as needed.        . colchicine 0.6 MG tablet Take one daily for 3 months  30 tablet  2  . folic acid (FOLVITE) 1 MG tablet Take 2 mg by mouth daily.       . hydroxychloroquine (PLAQUENIL) 200 MG tablet Take 200 mg by mouth 2 (two) times daily.        Marland Kitchen levothyroxine (SYNTHROID, LEVOTHROID) 112 MCG tablet Take 112 mcg by mouth daily.        . methotrexate (RHEUMATREX) 2.5 MG tablet Take 2.5 mg by mouth. Caution:Chemotherapy. Protect from light.  8 tablets once weekly       .  naproxen sodium (ANAPROX) 220 MG tablet Take 220 mg by mouth as needed.        . potassium chloride SA (K-DUR,KLOR-CON) 20 MEQ tablet Take 20 mEq by mouth daily.        . rosuvastatin (CRESTOR) 10 MG tablet Take 10 mg by mouth daily.        . sertraline (ZOLOFT) 100 MG tablet Take 200 mg by mouth daily.        . valsartan (DIOVAN) 160 MG tablet Take 1 tablet (160 mg total) by mouth daily.  30 tablet  6  . DISCONTD: ibuprofen (ADVIL,MOTRIN) 400 MG tablet Take one tablet three times a day for 2 weeks  42 tablet  0  . DISCONTD: Multiple Vitamins-Minerals (OCUVITE ADULT FORMULA PO) Take 1 tablet by mouth.        . DISCONTD: omeprazole (PRILOSEC) 20 MG capsule Take one daily for 2 weeks while you  are taking Ibuprofen  14 capsule  0  . DISCONTD: potassium chloride SA (KLOR-CON M20) 20 MEQ tablet Take 1 tablet (20 mEq total) by mouth 2 (two) times daily.  30 tablet  6   Current Facility-Administered Medications  Medication Dose Route Frequency Provider Last Rate Last Dose  . 0.9 %  sodium chloride infusion  500 mL Intravenous Continuous Meryl Dare, MD,FACG        BP 113/78  Pulse 81  Resp 16  Ht 5' (1.524 m)  Wt 147 lb (66.679 kg)  BMI 28.71 kg/m2 General: NAD Neck: No JVD, no thyromegaly or thyroid nodule.  Lungs: Clear to auscultation bilaterally with normal respiratory effort. CV: Nondisplaced PMI.  Heart regular S1/S2, no S3/S4, no murmur.  No peripheral edema.  No carotid bruit.  Normal pedal pulses.  Abdomen: Soft, nontender, no hepatosplenomegaly, no distention.  Skin: Intact without lesions or rashes.  Neurologic: Alert and oriented x 3.  Psych: Normal affect. Extremities: No clubbing or cyanosis.  HEENT: Normal.

## 2011-05-08 NOTE — Assessment & Plan Note (Signed)
BP is now under good control.  Continue current meds.

## 2011-06-18 NOTE — Progress Notes (Signed)
Addended by: Maple Hudson on: 06/18/2011 05:21 PM   Modules accepted: Orders, Level of Service

## 2013-10-30 DIAGNOSIS — M069 Rheumatoid arthritis, unspecified: Secondary | ICD-10-CM | POA: Diagnosis not present

## 2013-10-30 DIAGNOSIS — Z79899 Other long term (current) drug therapy: Secondary | ICD-10-CM | POA: Diagnosis not present

## 2013-12-25 DIAGNOSIS — M069 Rheumatoid arthritis, unspecified: Secondary | ICD-10-CM | POA: Diagnosis not present

## 2013-12-25 DIAGNOSIS — Z79899 Other long term (current) drug therapy: Secondary | ICD-10-CM | POA: Diagnosis not present

## 2014-01-12 DIAGNOSIS — L578 Other skin changes due to chronic exposure to nonionizing radiation: Secondary | ICD-10-CM | POA: Diagnosis not present

## 2014-01-12 DIAGNOSIS — Z85828 Personal history of other malignant neoplasm of skin: Secondary | ICD-10-CM | POA: Diagnosis not present

## 2014-01-12 DIAGNOSIS — Z85820 Personal history of malignant melanoma of skin: Secondary | ICD-10-CM | POA: Diagnosis not present

## 2014-01-13 DIAGNOSIS — H269 Unspecified cataract: Secondary | ICD-10-CM | POA: Diagnosis not present

## 2014-01-13 DIAGNOSIS — H04129 Dry eye syndrome of unspecified lacrimal gland: Secondary | ICD-10-CM | POA: Diagnosis not present

## 2014-01-13 DIAGNOSIS — H3530 Unspecified macular degeneration: Secondary | ICD-10-CM | POA: Diagnosis not present

## 2014-01-19 DIAGNOSIS — J329 Chronic sinusitis, unspecified: Secondary | ICD-10-CM | POA: Diagnosis not present

## 2014-01-19 DIAGNOSIS — J069 Acute upper respiratory infection, unspecified: Secondary | ICD-10-CM | POA: Diagnosis not present

## 2014-02-04 ENCOUNTER — Encounter: Payer: Self-pay | Admitting: Gastroenterology

## 2014-02-25 DIAGNOSIS — Z79899 Other long term (current) drug therapy: Secondary | ICD-10-CM | POA: Diagnosis not present

## 2014-02-25 DIAGNOSIS — M069 Rheumatoid arthritis, unspecified: Secondary | ICD-10-CM | POA: Diagnosis not present

## 2014-03-26 DIAGNOSIS — I10 Essential (primary) hypertension: Secondary | ICD-10-CM | POA: Diagnosis not present

## 2014-03-26 DIAGNOSIS — F329 Major depressive disorder, single episode, unspecified: Secondary | ICD-10-CM | POA: Diagnosis not present

## 2014-03-26 DIAGNOSIS — E7800 Pure hypercholesterolemia, unspecified: Secondary | ICD-10-CM | POA: Diagnosis not present

## 2014-03-26 DIAGNOSIS — F3289 Other specified depressive episodes: Secondary | ICD-10-CM | POA: Diagnosis not present

## 2014-04-19 DIAGNOSIS — H353190 Nonexudative age-related macular degeneration, unspecified eye, stage unspecified: Secondary | ICD-10-CM | POA: Diagnosis not present

## 2014-04-26 DIAGNOSIS — Z79899 Other long term (current) drug therapy: Secondary | ICD-10-CM | POA: Diagnosis not present

## 2014-04-26 DIAGNOSIS — M069 Rheumatoid arthritis, unspecified: Secondary | ICD-10-CM | POA: Diagnosis not present

## 2014-06-21 DIAGNOSIS — Z79899 Other long term (current) drug therapy: Secondary | ICD-10-CM | POA: Diagnosis not present

## 2014-06-21 DIAGNOSIS — M069 Rheumatoid arthritis, unspecified: Secondary | ICD-10-CM | POA: Diagnosis not present

## 2014-07-13 DIAGNOSIS — Z85828 Personal history of other malignant neoplasm of skin: Secondary | ICD-10-CM | POA: Diagnosis not present

## 2014-07-13 DIAGNOSIS — L578 Other skin changes due to chronic exposure to nonionizing radiation: Secondary | ICD-10-CM | POA: Diagnosis not present

## 2014-07-13 DIAGNOSIS — Z85820 Personal history of malignant melanoma of skin: Secondary | ICD-10-CM | POA: Diagnosis not present

## 2014-07-14 DIAGNOSIS — R7309 Other abnormal glucose: Secondary | ICD-10-CM | POA: Diagnosis not present

## 2014-07-14 DIAGNOSIS — E7800 Pure hypercholesterolemia, unspecified: Secondary | ICD-10-CM | POA: Diagnosis not present

## 2014-07-14 DIAGNOSIS — E039 Hypothyroidism, unspecified: Secondary | ICD-10-CM | POA: Diagnosis not present

## 2014-08-10 DIAGNOSIS — F419 Anxiety disorder, unspecified: Secondary | ICD-10-CM | POA: Diagnosis not present

## 2014-08-10 DIAGNOSIS — J40 Bronchitis, not specified as acute or chronic: Secondary | ICD-10-CM | POA: Diagnosis not present

## 2014-08-16 DIAGNOSIS — Z79899 Other long term (current) drug therapy: Secondary | ICD-10-CM | POA: Diagnosis not present

## 2014-08-16 DIAGNOSIS — M0609 Rheumatoid arthritis without rheumatoid factor, multiple sites: Secondary | ICD-10-CM | POA: Diagnosis not present

## 2014-08-20 DIAGNOSIS — Z23 Encounter for immunization: Secondary | ICD-10-CM | POA: Diagnosis not present

## 2014-09-01 ENCOUNTER — Ambulatory Visit (INDEPENDENT_AMBULATORY_CARE_PROVIDER_SITE_OTHER): Payer: Medicare Other | Admitting: Family Medicine

## 2014-09-01 VITALS — BP 155/80 | HR 91 | Temp 98.6°F | Resp 16 | Ht 60.0 in | Wt 155.0 lb

## 2014-09-01 DIAGNOSIS — E876 Hypokalemia: Secondary | ICD-10-CM | POA: Diagnosis not present

## 2014-09-01 DIAGNOSIS — E038 Other specified hypothyroidism: Secondary | ICD-10-CM

## 2014-09-01 DIAGNOSIS — M069 Rheumatoid arthritis, unspecified: Secondary | ICD-10-CM | POA: Insufficient documentation

## 2014-09-01 DIAGNOSIS — I351 Nonrheumatic aortic (valve) insufficiency: Secondary | ICD-10-CM

## 2014-09-01 DIAGNOSIS — Z23 Encounter for immunization: Secondary | ICD-10-CM

## 2014-09-01 NOTE — Patient Instructions (Signed)
We will get your referrals to Dr. Estanislado Pandy and Dr. Aundra Dubin started.  Let me know if you do not hear about these appointments.   You got your prevnar 13 vaccine today.  I think you should have a pneumovax in one year and then you are likely fully immunized against pneumonia.   I will be in touch with your labs asap

## 2014-09-01 NOTE — Progress Notes (Signed)
[facility] [address] [place] [address] [phone]  Date:  09/01/2014   Name:  Katherine Wang   DOB:  1945/12/21   MRN:  [id number]  PCP:  Lamar Blinks, MD    Chief Complaint: Establish Care   History of Present Illness:  Katherine Wang is a 68 y.o. very pleasant female patient who presents with the following:  Here today to re-establish care.  She had been in [place] for a few years but is now back in [place].  She was in [place] to be closer to her daughter and special needs grandchild.  She is not entirely happy to be back and [place] and misses her grandchildren but did not elaborate about her reasons for coming back to [facility].  She did well with her chronic illnesses while she was gone.  She is now on infusions of remicade for her RA, as well as methotrexate and folic acid.  She is working on getting her rheum to send her records here so she can re-establish with Dr. Estanislado Pandy.    Her TSH has not been checked in several months  She is also a former pt of Dr. Aundra Dubin for her cardiac issues and would like to re-establish with him as well   Patient Active Problem List   Diagnosis Date Noted  . Pericarditis 04/24/2011  . AORTIC INSUFFICIENCY 02/14/2009  . ELECTROCARDIOGRAM, ABNORMAL 02/14/2009  . PERNICIOUS ANEMIA 07/10/2007  . CANDIDIASIS, ORAL 07/03/2007  . DEPRESSION 07/03/2007  . MALAISE AND FATIGUE 07/03/2007  . HYPOTHYROIDISM 04/30/2007  . HYPERTENSION 04/30/2007  . ANEURYSM, THORACIC AORTIC 02/13/2006    Past Medical History  Diagnosis Date  . Allergy   . Hyperlipidemia   . Thyroid disease   . Arthritis   . Anxiety   . Depression   . Hypertension   . Aortic insufficiency   . Murmur, heart   . Stroke     per pt. mild stroke  . Cancer   . Cataract   . Macular degeneration     Past Surgical History  Procedure Laterality Date  . Cholecystectomy    . Colonoscopy      30 + years ago, unsure of where  . Abdominal hysterectomy     . Lasik    . Cardiac catheterization    . Nasal septum surgery    . Melanoma excision    . Appendectomy    . Eye surgery    . Tubal ligation      History  Substance Use Topics  . Smoking status: Former Research scientist (life sciences)  . Smokeless tobacco: Never Used  . Alcohol Use: No    Family History  Problem Relation Age of Onset  . Heart disease Mother   . Hypertension Mother   . Mental illness Mother   . Heart disease Father   . Hypertension Father   . Mental illness Father   . Hyperlipidemia Sister   . Hypertension Sister   . Hypertension Sister   . Diabetes Sister     No Known Allergies  Medication list has been reviewed and updated.  Current Outpatient Prescriptions on File Prior to Visit  Medication Sig Dispense Refill  . buPROPion (WELLBUTRIN) 100 MG tablet Take 100 mg by mouth daily.      . chlorthalidone (HYGROTON) 25 MG tablet Take 1/2 tablet daily 15 tablet 6  . clonazePAM (KLONOPIN) 0.5 MG tablet Take 0.5 mg by mouth 2 (two) times daily as needed.      Marland Kitchen  folic acid (FOLVITE) 1 MG tablet Take 2 mg by mouth daily.     Marland Kitchen levothyroxine (SYNTHROID, LEVOTHROID) 112 MCG tablet Take 100 mcg by mouth daily.     . methotrexate (RHEUMATREX) 2.5 MG tablet Take 2.5 mg by mouth. Caution:Chemotherapy. Protect from light.  6 tablets once weekly    . rosuvastatin (CRESTOR) 10 MG tablet Take 10 mg by mouth daily.      . sertraline (ZOLOFT) 100 MG tablet Take 200 mg by mouth daily.      . valsartan (DIOVAN) 160 MG tablet Take 1 tablet (160 mg total) by mouth daily. 30 tablet 6  . Calcium-Vitamin D (CALTRATE 600 PLUS-VIT D PO) Take 1 capsule by mouth daily.      . cetirizine-pseudoephedrine (ZYRTEC-D) 5-120 MG per tablet Take 1 tablet by mouth as needed.      . colchicine 0.6 MG tablet Take one daily for 3 months 30 tablet 2  . hydroxychloroquine (PLAQUENIL) 200 MG tablet Take 200 mg by mouth 2 (two) times daily.      . naproxen sodium (ANAPROX) 220 MG tablet Take 220 mg by mouth as needed.       . potassium chloride SA (K-DUR,KLOR-CON) 20 MEQ tablet Take 20 mEq by mouth daily.       No current facility-administered medications on file prior to visit.    Review of Systems:  As per HPI- otherwise negative.   Physical Examination: Filed Vitals:   09/01/14 1449  BP: 162/84  Pulse: 91  Temp: 98.6 F (37 C)  Resp: 16   Filed Vitals:   09/01/14 1449  Height: 5' (1.524 m)  Weight: 155 lb (70.308 kg)   Body mass index is 30.27 kg/(m^2). Ideal Body Weight: Weight in (lb) to have BMI = 25: 127.7  GEN: WDWN, NAD, Non-toxic, A & O x 3, overweight, looks well HEENT: Atraumatic, Normocephalic. Neck supple. No masses, No LAD. Ears and Nose: No external deformity. CV: RRR, No M/G/R. No JVD. No thrill. No extra heart sounds. PULM: CTA B, no wheezes, crackles, rhonchi. No retractions. No resp. distress. No accessory muscle use. EXTR: No c/c/e NEURO Normal gait.  PSYCH: Normally interactive. Conversant. Not depressed or anxious appearing.  Calm demeanor.    Assessment and Plan: Rheumatoid arthritis - Plan: Ambulatory referral to Rheumatology  Aortic insufficiency - Plan: Ambulatory referral to Cardiology  Immunization due - Plan: Pneumococcal conjugate vaccine 13-valent IM  Hypokalemia - Plan: Basic metabolic panel  Other specified hypothyroidism - Plan: TSH  She had been started on kdur 19meg daily for hypokalemia nearly a month ago- will recheck today Also recheck her TSH as her dose was adjusted to 192mcg a month or two ago Referrals pending for rheum and cardiology prevnar 13 today   Signed Lamar Blinks, MD

## 2014-09-02 ENCOUNTER — Encounter: Payer: Self-pay | Admitting: Family Medicine

## 2014-09-02 LAB — BASIC METABOLIC PANEL
BUN: 17 mg/dL (ref 6–23)
CO2: 24 meq/L (ref 19–32)
Calcium: 9.8 mg/dL (ref 8.4–10.5)
Chloride: 99 mEq/L (ref 96–112)
Creat: 0.94 mg/dL (ref 0.50–1.10)
Glucose, Bld: 61 mg/dL — ABNORMAL LOW (ref 70–99)
POTASSIUM: 3.8 meq/L (ref 3.5–5.3)
SODIUM: 135 meq/L (ref 135–145)

## 2014-09-02 LAB — TSH: TSH: 0.686 u[IU]/mL (ref 0.350–4.500)

## 2014-09-16 DIAGNOSIS — Z111 Encounter for screening for respiratory tuberculosis: Secondary | ICD-10-CM | POA: Diagnosis not present

## 2014-09-16 DIAGNOSIS — Z79899 Other long term (current) drug therapy: Secondary | ICD-10-CM | POA: Diagnosis not present

## 2014-09-16 DIAGNOSIS — R5381 Other malaise: Secondary | ICD-10-CM | POA: Diagnosis not present

## 2014-09-16 DIAGNOSIS — M79642 Pain in left hand: Secondary | ICD-10-CM | POA: Diagnosis not present

## 2014-09-16 DIAGNOSIS — M79672 Pain in left foot: Secondary | ICD-10-CM | POA: Diagnosis not present

## 2014-09-16 DIAGNOSIS — R300 Dysuria: Secondary | ICD-10-CM | POA: Diagnosis not present

## 2014-09-16 DIAGNOSIS — M79641 Pain in right hand: Secondary | ICD-10-CM | POA: Diagnosis not present

## 2014-09-16 DIAGNOSIS — M0579 Rheumatoid arthritis with rheumatoid factor of multiple sites without organ or systems involvement: Secondary | ICD-10-CM | POA: Diagnosis not present

## 2014-09-16 DIAGNOSIS — Z113 Encounter for screening for infections with a predominantly sexual mode of transmission: Secondary | ICD-10-CM | POA: Diagnosis not present

## 2014-09-16 DIAGNOSIS — M79671 Pain in right foot: Secondary | ICD-10-CM | POA: Diagnosis not present

## 2014-09-16 DIAGNOSIS — M25561 Pain in right knee: Secondary | ICD-10-CM | POA: Diagnosis not present

## 2014-09-16 DIAGNOSIS — M2550 Pain in unspecified joint: Secondary | ICD-10-CM | POA: Diagnosis not present

## 2014-09-16 DIAGNOSIS — M25562 Pain in left knee: Secondary | ICD-10-CM | POA: Diagnosis not present

## 2014-09-17 ENCOUNTER — Other Ambulatory Visit: Payer: Self-pay

## 2014-09-17 MED ORDER — FLUTICASONE FUROATE 27.5 MCG/SPRAY NA SUSP: 2.0000 | Freq: Every day | NASAL | Status: DC

## 2014-09-17 NOTE — Telephone Encounter (Signed)
Pt called in and states she seen Dr Lorelei Pont 11-4 and she was suppose to call in a script for Fluticasone nasal spray. [facility] [place]. Thank you

## 2014-09-17 NOTE — Telephone Encounter (Signed)
Please advise- medication has been pended.

## 2014-10-11 ENCOUNTER — Institutional Professional Consult (permissible substitution): Payer: Medicare Other | Admitting: Cardiology

## 2014-10-23 ENCOUNTER — Other Ambulatory Visit: Payer: Self-pay | Admitting: Family Medicine

## 2014-10-23 ENCOUNTER — Telehealth: Payer: Self-pay

## 2014-10-23 MED ORDER — CLONAZEPAM 0.5 MG PO TABS: 0.5000 mg | ORAL_TABLET | Freq: Two times a day (BID) | ORAL | Status: DC | PRN

## 2014-10-23 NOTE — Telephone Encounter (Signed)
The patient called to request refill of Clonazepam .5 mg.  The patient stated that she didn't realize that she was low since she has been out of town and that she takes one per day.  The patient states she does not have enough to get through to Monday when Dr. Lorelei Pont will be in the office.  Please call the patient at [phone].

## 2014-10-23 NOTE — Telephone Encounter (Signed)
Meds ordered this encounter  Medications  . clonazePAM (KLONOPIN) 0.5 MG tablet    Sig: Take 1 tablet (0.5 mg total) by mouth 2 (two) times daily as needed.    Dispense:  30 tablet    Refill:  0    Order Specific Question:  Supervising Provider    Answer:  DOOLITTLE, ROBERT P [[id number]]

## 2014-10-25 NOTE — Telephone Encounter (Signed)
LMOM that we had sent in RF on 10/23/14.

## 2014-11-15 ENCOUNTER — Encounter: Payer: Self-pay | Admitting: *Deleted

## 2014-11-15 ENCOUNTER — Ambulatory Visit (INDEPENDENT_AMBULATORY_CARE_PROVIDER_SITE_OTHER): Payer: Medicare Other | Admitting: Cardiology

## 2014-11-15 ENCOUNTER — Encounter: Payer: Self-pay | Admitting: Cardiology

## 2014-11-15 VITALS — BP 124/64 | HR 81 | Ht 60.0 in | Wt 151.0 lb

## 2014-11-15 DIAGNOSIS — I712 Thoracic aortic aneurysm, without rupture, unspecified: Secondary | ICD-10-CM

## 2014-11-15 DIAGNOSIS — I359 Nonrheumatic aortic valve disorder, unspecified: Secondary | ICD-10-CM | POA: Diagnosis not present

## 2014-11-15 DIAGNOSIS — E785 Hyperlipidemia, unspecified: Secondary | ICD-10-CM | POA: Insufficient documentation

## 2014-11-15 DIAGNOSIS — I350 Nonrheumatic aortic (valve) stenosis: Secondary | ICD-10-CM | POA: Diagnosis not present

## 2014-11-15 DIAGNOSIS — I77810 Thoracic aortic ectasia: Secondary | ICD-10-CM

## 2014-11-15 NOTE — Progress Notes (Signed)
Patient ID: Katherine Wang, female   DOB: 23-Sep-1946, 69 y.o.   MRN: [id number] PCP: Dr. Janett Billow Copland  69 yo with history of rheumatoid arthritis, aortic insufficiency, mild ascending aortic dilatation, and suspected prior acute pericarditis returns for evaluation of chest pain.  Patient's cardiac history begins in 2004 when she had possible acute pericarditis in [place].  LHC at that time showed no significant disease.  In our office, she has been followed for a malformed (though trileaflet) aortic valve.  She has had mild aortic insufficiency and a mildly dilated ascending aorta.  She moved to [place] in 2012 to help her daughter who has an autistic child.  She is back in [place] now.  She says she had a stress test at some time in 14 in [place].  She thinks it was normal.  No chest pain.  She can walk around the block without dyspnea but is short of breath walking up a flight of steps.  No orthopnea, PND, lightheadedness, or palpitations.  She used to take Crestor but is off it due to expense. BP is controlled.   Labs (6/12): BNP 86, ESR 83 Labs (11/15): K 3.8, creatinine 0.94, TSH normal  PMH: 1. Acute pericarditis in 2004: Hospitalized in [place].  Had LHC at that time that she reports as showing no significant CAD.  Possible recurrent pericarditis 6/12.  2. 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  Last echo (4/10) showed EF 55-60%, mild LV dilation, mild LV hypertrophy, mild aortic insufficiency, mild MR.  Cardiac MRI (4/10) confirmed that the aortic valve was trileaflet but malformed, EF 64%. Echo (6/12): EF 50%, moderate (grade II) diastolic dysfunction, mild AI, mild MR, no pericardial effusion.   3.  Ascending aorta dilation: MRA chest (4/10) with 4 x 3.6 cm ascending aorta.  CTA chest (7/11) with 3.7 x 3.7 cm ascending aorta and aberrant right subclavian course.  4.  HTN 5.  Rheumatoid arthritis  6.  H/o rheumatic fever in childhood.  7.  Hyperlipidemia  SH: Lives alone, single.  Divorced, 2 daughters.  Works a Camera operator.  Quit smoking in the 1980s.    FH: Mother with angina.   Father with MI at 37.    ROS: All systems reviewed and negative except as per HPI.   Current Outpatient Prescriptions  Medication Sig Dispense Refill  . albuterol (PROVENTIL HFA;VENTOLIN HFA) 108 (90 BASE) MCG/ACT inhaler Inhale 2 puffs into the lungs daily as needed for wheezing or shortness of breath.    Marland Kitchen buPROPion (WELLBUTRIN) 100 MG tablet Take 100 mg by mouth daily.      . chlorthalidone (HYGROTON) 25 MG tablet Take 1/2 tablet daily 15 tablet 6  . Cholecalciferol (VITAMIN D-3) 1000 UNITS CAPS Take 1,000 Units by mouth.    . clonazePAM (KLONOPIN) 0.5 MG tablet Take 1 tablet (0.5 mg total) by mouth 2 (two) times daily as needed. 30 tablet 0  . cycloSPORINE (RESTASIS) 0.05 % ophthalmic emulsion 1 drop 2 (two) times daily.    . fluticasone (VERAMYST) 27.5 MCG/SPRAY nasal spray Place 2 sprays into the nose daily. 10 g 5  . levothyroxine (SYNTHROID, LEVOTHROID) 100 MCG tablet Take 100 mcg by mouth daily before breakfast.    . Multiple Vitamins-Minerals (PRESERVISION AREDS PO) Take by mouth.    . naproxen sodium (ANAPROX) 220 MG tablet Take 220 mg by mouth as needed.      . sertraline (ZOLOFT) 100 MG tablet Take 200 mg by mouth daily.      Marland Kitchen  valsartan (DIOVAN) 160 MG tablet Take 1 tablet (160 mg total) by mouth daily. 30 tablet 6  . aspirin EC 81 MG tablet Take 1 tablet (81 mg total) by mouth daily.     No current facility-administered medications for this visit.    BP 124/64 mmHg  Pulse 81  Ht 5' (1.524 m)  Wt 151 lb (68.493 kg)  BMI 29.49 kg/m2 General: NAD Neck: No JVD, no thyromegaly or thyroid nodule.  Lungs: Clear to auscultation bilaterally with normal respiratory effort. CV: Nondisplaced PMI.  Heart regular S1/S2, no S3/M1, 1/6 diastolic murmur RUSB.  No peripheral edema.  No carotid bruit.  Normal pedal pulses.  Abdomen:  Soft, nontender, no hepatosplenomegaly, no distention.  Skin: Intact without lesions or rashes.  Neurologic: Alert and oriented x 3.  Psych: Normal affect. Extremities: No clubbing or cyanosis.  HEENT: Normal.   Assessment/Plan: 1. Aortic valve disorder: Malformed aortic valve with mild AI on 2012 echo.  She has mild exertional symptoms.  I will get an echo to reassess the aortic valve.  2. Ascending aorta dilation: Mild on last MRA in 2012.  I will repeat an MRA chest to reassess.  3. HTN: BP controlled on current meds.  4. Hyperlipidemia: Off Crestor due to expense.  I will check lipids, if LDL high will put her on atorvastatin.  5. Reasonable to take ASA 81 daily.   If echo and MRA are stable, she can followup in 1 year.   Loralie Champagne 11/15/2014

## 2014-11-15 NOTE — Patient Instructions (Signed)
Your physician recommends that you have  lab work today--Lipid profile/BMET.  Your physician has requested that you have an echocardiogram. Echocardiography is a painless test that uses sound waves to create images of your heart. It provides your doctor with information about the size and shape of your heart and how well your heart's chambers and valves are working. This procedure takes approximately one hour. There are no restrictions for this procedure.  Schedule an appointment for an MRA of your chest.  Your physician wants you to follow-up in: 1 year with Dr Aundra Dubin. (January 2017). You will receive a reminder letter in the mail two months in advance. If you don't receive a letter, please call our office to schedule the follow-up appointment.

## 2014-11-16 LAB — BASIC METABOLIC PANEL
BUN: 15 mg/dL (ref 6–23)
CALCIUM: 9.7 mg/dL (ref 8.4–10.5)
CO2: 28 meq/L (ref 19–32)
CREATININE: 1.11 mg/dL (ref 0.40–1.20)
Chloride: 99 mEq/L (ref 96–112)
GFR: 51.54 mL/min — ABNORMAL LOW (ref >60.00)
Glucose, Bld: 97 mg/dL (ref 70–99)
Potassium: 3.5 mEq/L (ref 3.5–5.1)
Sodium: 134 mEq/L — ABNORMAL LOW (ref 135–145)

## 2014-11-16 LAB — LIPID PANEL
CHOLESTEROL: 266 mg/dL — AB (ref 0–200)
HDL: 42.8 mg/dL (ref >39.00)
LDL Cholesterol: 189 mg/dL — ABNORMAL HIGH (ref 0–99)
NonHDL: 223.2
Total CHOL/HDL Ratio: 6
Triglycerides: 169 mg/dL — ABNORMAL HIGH (ref 0.0–149.0)
VLDL: 33.8 mg/dL (ref 0.0–40.0)

## 2014-11-18 ENCOUNTER — Other Ambulatory Visit: Payer: Self-pay

## 2014-11-18 DIAGNOSIS — F411 Generalized anxiety disorder: Secondary | ICD-10-CM

## 2014-11-18 MED ORDER — CLONAZEPAM 0.5 MG PO TABS: 0.5000 mg | ORAL_TABLET | Freq: Two times a day (BID) | ORAL | Status: DC | PRN

## 2014-11-18 NOTE — Telephone Encounter (Signed)
Pt called wanting a prescription refill fr cloonazePAM (KLONOPIN) 0.5 MG tablet [[id number]]. She wants [place] [facility] [place]. I told her she might have to come in and pick it up. Please advise at [phone]

## 2014-11-18 NOTE — Telephone Encounter (Signed)
Dr Lorelei Pont, it looks like [facility] filled this for pt last time, but that you are only provider who has seen pt in [facility]. Please advise.

## 2014-11-22 ENCOUNTER — Other Ambulatory Visit (HOSPITAL_COMMUNITY): Payer: Medicare Other

## 2014-11-22 ENCOUNTER — Other Ambulatory Visit: Payer: Self-pay | Admitting: Family Medicine

## 2014-11-22 ENCOUNTER — Other Ambulatory Visit: Payer: Self-pay | Admitting: *Deleted

## 2014-11-22 DIAGNOSIS — E785 Hyperlipidemia, unspecified: Secondary | ICD-10-CM

## 2014-11-22 MED ORDER — ATORVASTATIN CALCIUM 40 MG PO TABS: 40.0000 mg | ORAL_TABLET | Freq: Every day | ORAL | Status: DC

## 2014-11-26 ENCOUNTER — Other Ambulatory Visit: Payer: Self-pay | Admitting: Family Medicine

## 2014-11-29 ENCOUNTER — Other Ambulatory Visit: Payer: Self-pay | Admitting: Family Medicine

## 2014-11-29 ENCOUNTER — Other Ambulatory Visit: Payer: Self-pay | Admitting: Urgent Care

## 2014-11-29 ENCOUNTER — Other Ambulatory Visit: Payer: Self-pay

## 2014-11-29 DIAGNOSIS — F3289 Other specified depressive episodes: Secondary | ICD-10-CM

## 2014-11-29 DIAGNOSIS — E038 Other specified hypothyroidism: Secondary | ICD-10-CM

## 2014-11-29 MED ORDER — LEVOTHYROXINE SODIUM 100 MCG PO TABS: 100.0000 ug | ORAL_TABLET | Freq: Every day | ORAL | Status: DC

## 2014-11-29 MED ORDER — SERTRALINE HCL 100 MG PO TABS: 200.0000 mg | ORAL_TABLET | Freq: Every day | ORAL | Status: DC

## 2014-11-29 NOTE — Telephone Encounter (Signed)
Please advise since original prescription was not prescribed by this office.

## 2014-11-29 NOTE — Telephone Encounter (Signed)
Patient called to request a refill for Synthroid and Sertraline.  Patient states that a request was also made by [facility] [place]  Please call patient to advise if refills are approved.

## 2014-11-29 NOTE — Telephone Encounter (Signed)
She called back- she has been taking combo of wellbutrin and sertraline for years and is comfortable with risk, it is working well for her

## 2014-11-29 NOTE — Telephone Encounter (Signed)
Patient request refill approval for Synthroid and

## 2014-11-29 NOTE — Telephone Encounter (Signed)
Called and LMOM. I did refills for her- however not sure if she is still taking wellbutrin as well or not.  Please let me know- there is an increased risk of SE when combining sertraline and wellbutrin although these medications certainly can be combined.  Please come and see me in 6 months or so

## 2014-12-02 ENCOUNTER — Ambulatory Visit (HOSPITAL_COMMUNITY): Payer: Medicare Other

## 2014-12-07 ENCOUNTER — Ambulatory Visit (HOSPITAL_COMMUNITY): Payer: Medicare Other | Attending: Cardiology

## 2014-12-07 DIAGNOSIS — I77810 Thoracic aortic ectasia: Secondary | ICD-10-CM

## 2014-12-07 DIAGNOSIS — I350 Nonrheumatic aortic (valve) stenosis: Secondary | ICD-10-CM | POA: Insufficient documentation

## 2014-12-07 DIAGNOSIS — I712 Thoracic aortic aneurysm, without rupture: Secondary | ICD-10-CM | POA: Diagnosis not present

## 2014-12-07 NOTE — Progress Notes (Signed)
2D Echo completed. 12/07/2014

## 2014-12-08 ENCOUNTER — Telehealth: Payer: Self-pay | Admitting: Cardiology

## 2014-12-08 NOTE — Telephone Encounter (Signed)
EF 50-55% is low normal, similar to prior.  She has moderate aortic insufficiency.  This will just need to be followed in the future.  She should still have the MRA as the echo does not show the full thoracic aorta and she has had dilation of the thoracic aorta noted in the past.

## 2014-12-08 NOTE — Telephone Encounter (Signed)
Notified the pt of Dr Katherine Wang recommendations for proceeding with the scheduled MRA for 12/13/14, for this will show the full thoracic aorta and she has had dilation of the thoracic aorta noted in the past.  Informed the pt that he would like to reassess this with the MRA.  Pt verbalized understanding and agrees with this plan.

## 2014-12-08 NOTE — Telephone Encounter (Signed)
New message      Pt said her echo results were good.  Will she still need to have her MRA?

## 2014-12-08 NOTE — Telephone Encounter (Signed)
Pt calling to ask Dr Aundra Dubin and nurse if she still needs to proceed with getting her MRA scheduled for 12/13/14.  Pt states that per Dr Aundra Dubin, she was told if her echo results were normal, then there will be no need to proceed with the MRA.  Pt would like clarification on this, for she states she was told her echo was good with an EF of 50-55 %.  Informed the pt that Dr Aundra Dubin and nurse are both out of the office today, but I will route this message to them for further review and recommendation and follow-up thereafter.  Pt verbalized understanding and agrees with this plan.

## 2014-12-11 ENCOUNTER — Ambulatory Visit (INDEPENDENT_AMBULATORY_CARE_PROVIDER_SITE_OTHER): Payer: Medicare Other

## 2014-12-11 ENCOUNTER — Ambulatory Visit (INDEPENDENT_AMBULATORY_CARE_PROVIDER_SITE_OTHER): Payer: Medicare Other | Admitting: Family Medicine

## 2014-12-11 VITALS — BP 140/72 | HR 79 | Temp 98.1°F | Resp 16 | Ht 60.5 in | Wt 151.1 lb

## 2014-12-11 DIAGNOSIS — F411 Generalized anxiety disorder: Secondary | ICD-10-CM

## 2014-12-11 DIAGNOSIS — R059 Cough, unspecified: Secondary | ICD-10-CM

## 2014-12-11 DIAGNOSIS — R05 Cough: Secondary | ICD-10-CM | POA: Diagnosis not present

## 2014-12-11 MED ORDER — DOXYCYCLINE HYCLATE 100 MG PO CAPS: 100.0000 mg | ORAL_CAPSULE | Freq: Two times a day (BID) | ORAL | Status: DC

## 2014-12-11 MED ORDER — CLONAZEPAM 0.5 MG PO TABS: 0.5000 mg | ORAL_TABLET | Freq: Two times a day (BID) | ORAL | Status: DC | PRN

## 2014-12-11 NOTE — Progress Notes (Signed)
Urgent [facility] and [facility] [address] [place]  [address] [phone]  Date:  12/11/2014   Name:  Katherine Wang   DOB:  [birth year]   MRN:  [id number]  PCP:  Lamar Blinks, MD    Chief Complaint: Sinus Drainage; Sore Throat; and Cough   History of Present Illness:  Katherine Wang is a 69 y.o. very pleasant female patient who presents with the following:  She is here today with concern about illness for about 3 days.  She notes a ST, dry cough, sinus pressure.   She is concerned because it may snow next week so she wanted to come in today "before it goes to my chest."  She has felt warm but did not check her temperature She has not noted any aches or chills.   Last TSH check in November She also needs a refill of her clonazepam- 0.5 mg.  She uses it up to twice a day as needed; she has used this for years.  She is actually taking it just once a day most days- this is a decrease.  Last filled #30 last month and needs a refill.    Patient Active Problem List   Diagnosis Date Noted  . Hyperlipidemia 11/15/2014  . Rheumatoid arthritis 09/01/2014  . Pericarditis 04/24/2011  . Aortic valve disorder 02/14/2009  . ELECTROCARDIOGRAM, ABNORMAL 02/14/2009  . PERNICIOUS ANEMIA 07/10/2007  . CANDIDIASIS, ORAL 07/03/2007  . DEPRESSION 07/03/2007  . MALAISE AND FATIGUE 07/03/2007  . HYPOTHYROIDISM 04/30/2007  . HYPERTENSION 04/30/2007  . Aneurysm of thoracic aorta 02/13/2006    Past Medical History  Diagnosis Date  . Allergy   . Hyperlipidemia   . Thyroid disease   . Arthritis   . Anxiety   . Depression   . Hypertension   . Aortic insufficiency   . Murmur, heart   . Stroke     per pt. mild stroke  . Cancer   . Cataract   . Macular degeneration     Past Surgical History  Procedure Laterality Date  . Cholecystectomy    . Colonoscopy      30 + years ago, unsure of where  . Abdominal hysterectomy    . Lasik    . Cardiac catheterization    . Nasal septum  surgery    . Melanoma excision    . Appendectomy    . Eye surgery    . Tubal ligation      History  Substance Use Topics  . Smoking status: Former Research scientist (life sciences)  . Smokeless tobacco: Never Used  . Alcohol Use: No    Family History  Problem Relation Age of Onset  . Heart disease Mother   . Hypertension Mother   . Mental illness Mother   . Heart disease Father   . Hypertension Father   . Mental illness Father   . Hyperlipidemia Sister   . Hypertension Sister   . Hypertension Sister   . Diabetes Sister     No Known Allergies  Medication list has been reviewed and updated.  Current Outpatient Prescriptions on File Prior to Visit  Medication Sig Dispense Refill  . albuterol (PROVENTIL HFA;VENTOLIN HFA) 108 (90 BASE) MCG/ACT inhaler Inhale 2 puffs into the lungs daily as needed for wheezing or shortness of breath.    Marland Kitchen aspirin EC 81 MG tablet Take 1 tablet (81 mg total) by mouth daily.    Marland Kitchen atorvastatin (LIPITOR) 40 MG tablet Take 1 tablet (40 mg total) by mouth daily.  30 tablet 3  . buPROPion (WELLBUTRIN) 100 MG tablet Take 100 mg by mouth daily.      . chlorthalidone (HYGROTON) 25 MG tablet Take 1/2 tablet daily 15 tablet 6  . Cholecalciferol (VITAMIN D-3) 1000 UNITS CAPS Take 1,000 Units by mouth.    . clonazePAM (KLONOPIN) 0.5 MG tablet Take 1 tablet (0.5 mg total) by mouth 2 (two) times daily as needed. 30 tablet 0  . cycloSPORINE (RESTASIS) 0.05 % ophthalmic emulsion 1 drop 2 (two) times daily.    . fluticasone (VERAMYST) 27.5 MCG/SPRAY nasal spray Place 2 sprays into the nose daily. 10 g 5  . levothyroxine (SYNTHROID, LEVOTHROID) 100 MCG tablet Take 1 tablet (100 mcg total) by mouth daily before breakfast. 30 tablet 5  . Multiple Vitamins-Minerals (PRESERVISION AREDS PO) Take by mouth.    . naproxen sodium (ANAPROX) 220 MG tablet Take 220 mg by mouth as needed.      . sertraline (ZOLOFT) 100 MG tablet Take 2 tablets (200 mg total) by mouth daily. 60 tablet 5  . valsartan  (DIOVAN) 160 MG tablet Take 1 tablet (160 mg total) by mouth daily. 30 tablet 6   No current facility-administered medications on file prior to visit.    Review of Systems:  As per HPI- otherwise negative.   Physical Examination: Filed Vitals:   12/11/14 1304  BP: 140/72  Pulse: 79  Temp: 98.1 F (36.7 C)  Resp: 16   Filed Vitals:   12/11/14 1304  Height: 5' 0.5" (1.537 m)  Weight: 151 lb 2 oz (68.55 kg)   Body mass index is 29.02 kg/(m^2). Ideal Body Weight: Weight in (lb) to have BMI = 25: 129.9  GEN: WDWN, NAD, Non-toxic, A & O x 3, overweight, looks well HEENT: Atraumatic, Normocephalic. Neck supple. No masses, No LAD.  Bilateral TM wnl, oropharynx normal.  PEERL,EOMI.   Ears and Nose: No external deformity. CV: RRR, No M/G/R. No JVD. No thrill. No extra heart sounds. PULM: CTA B, no wheezes, or rhonchi. No retractions. No resp. distress. No accessory muscle use. ABD: S, NT, ND EXTR: No c/c/e NEURO Normal gait.  PSYCH: Normally interactive. Conversant. Not depressed or anxious appearing.  Calm demeanor.  Slight crackle in the right lower lung field  [facility] reading (PRIMARY) by  Dr. Lorelei Pont. CXR: scoliosis, OW negative  CHEST 2 VIEW  COMPARISON: 07/31/2011 and prior radiographs  FINDINGS: The cardiomediastinal silhouette is unchanged with a tortuous thoracic aorta.  There is no evidence of focal airspace disease, pulmonary edema, suspicious pulmonary nodule/mass, pleural effusion, or pneumothorax. No acute bony abnormalities are identified.  No evidence of active cardiopulmonary disease.  IMPRESSION: No active cardiopulmonary disease.   Assessment and Plan: Cough - Plan: DG Chest 2 View, doxycycline (VIBRAMYCIN) 100 MG capsule  GAD (generalized anxiety disorder) - Plan: clonazePAM (KLONOPIN) 0.5 MG tablet, clonazePAM (KLONOPIN) 0.5 MG tablet  Refilled her klonopin- she got #30 less than a month ago but she sometimes takes it BID Reassured that at  this time she does not need to take abx.  However she is concerned about impending bad weather and would like to have something at hand in case of emergency which is reasonable.  Gave a paper rx for doxycycline for her to hold and use only if necessary.  She will use it if she is not getting better over the next few days- Sooner if worse.   She will call me if any questions or concnes   Meds ordered this encounter  Medications  . clonazePAM (KLONOPIN) 0.5 MG tablet    Sig: Take 1 tablet (0.5 mg total) by mouth 2 (two) times daily as needed. Ok to fill on 12/18/14    Dispense:  30 tablet    Refill:  1  . doxycycline (VIBRAMYCIN) 100 MG capsule    Sig: Take 1 capsule (100 mg total) by mouth 2 (two) times daily.    Dispense:  20 capsule    Refill:  0  . clonazePAM (KLONOPIN) 0.5 MG tablet    Sig: Take 1 tablet (0.5 mg total) by mouth 2 (two) times daily as needed.    Dispense:  60 tablet    Refill:  1      Signed Lamar Blinks, MD

## 2014-12-11 NOTE — Patient Instructions (Signed)
At this point I think that you have a viral URI (cold).  Hold onto the antibiotic rx (doxycycline) and fill it only if you seem to be getting worse or if you are not better in the next few days.  Call me if you have any questions about this.

## 2014-12-13 ENCOUNTER — Ambulatory Visit (HOSPITAL_COMMUNITY): Admission: RE | Admit: 2014-12-13 | Payer: Medicare Other | Source: Ambulatory Visit

## 2014-12-17 ENCOUNTER — Encounter: Payer: Self-pay | Admitting: Family Medicine

## 2014-12-20 ENCOUNTER — Other Ambulatory Visit: Payer: Self-pay | Admitting: Family Medicine

## 2014-12-20 ENCOUNTER — Encounter: Payer: Self-pay | Admitting: Family Medicine

## 2014-12-21 ENCOUNTER — Telehealth: Payer: Self-pay

## 2014-12-21 DIAGNOSIS — Z79899 Other long term (current) drug therapy: Secondary | ICD-10-CM | POA: Diagnosis not present

## 2014-12-21 DIAGNOSIS — M25562 Pain in left knee: Secondary | ICD-10-CM | POA: Diagnosis not present

## 2014-12-21 DIAGNOSIS — M069 Rheumatoid arthritis, unspecified: Secondary | ICD-10-CM | POA: Diagnosis not present

## 2014-12-21 DIAGNOSIS — M79641 Pain in right hand: Secondary | ICD-10-CM | POA: Diagnosis not present

## 2014-12-21 MED ORDER — FLUTICASONE FUROATE 27.5 MCG/SPRAY NA SUSP: 2.0000 | Freq: Every day | NASAL | Status: DC

## 2014-12-21 MED ORDER — VALSARTAN 160 MG PO TABS: 160.0000 mg | ORAL_TABLET | Freq: Every day | ORAL | Status: DC

## 2014-12-21 NOTE — Telephone Encounter (Signed)
-----   Message -----     From: Katherine Wang     Sent: 12/17/2014 12:15 PM      To: [facility] Clinical Message Pool    Subject: Non-Urgent Medical Question                 Dr. Lorelei Pont, will you please send authorization for my Valsartan 160mg  [facility] [place], phone [phone]. They tell me there are no more refills available. Thanks. Katherine Wang

## 2014-12-21 NOTE — Telephone Encounter (Signed)
I sent a request for Valsartan 160mg  to be [facility]ill [facility] [place] several days ago. Drugstore has not received it yet. Also I need Fluticasone Propionate nasal spray refilled. Thanks for your attention to this. Shauna Hugh

## 2014-12-21 NOTE — Telephone Encounter (Signed)
Rx's sent in. Notified via my-chart.

## 2014-12-30 ENCOUNTER — Ambulatory Visit (HOSPITAL_COMMUNITY)
Admission: RE | Admit: 2014-12-30 | Discharge: 2014-12-30 | Disposition: A | Discharge status: Home / Self Care | Payer: Medicare Other | Source: Ambulatory Visit | Attending: Cardiology | Admitting: Cardiology

## 2014-12-30 DIAGNOSIS — I77810 Thoracic aortic ectasia: Secondary | ICD-10-CM | POA: Insufficient documentation

## 2014-12-30 DIAGNOSIS — I350 Nonrheumatic aortic (valve) stenosis: Secondary | ICD-10-CM

## 2014-12-30 DIAGNOSIS — I712 Thoracic aortic aneurysm, without rupture: Secondary | ICD-10-CM | POA: Diagnosis not present

## 2014-12-30 LAB — CREATININE, SERUM
Creatinine, Ser: 0.84 mg/dL (ref 0.50–1.10)
GFR, EST AFRICAN AMERICAN: 80 mL/min — AB (ref >90)
GFR, EST NON AFRICAN AMERICAN: 69 mL/min — AB (ref >90)

## 2014-12-31 ENCOUNTER — Encounter: Payer: Self-pay | Admitting: Gastroenterology

## 2015-01-06 DIAGNOSIS — M19041 Primary osteoarthritis, right hand: Secondary | ICD-10-CM | POA: Diagnosis not present

## 2015-01-06 DIAGNOSIS — M0579 Rheumatoid arthritis with rheumatoid factor of multiple sites without organ or systems involvement: Secondary | ICD-10-CM | POA: Diagnosis not present

## 2015-01-06 DIAGNOSIS — M25562 Pain in left knee: Secondary | ICD-10-CM | POA: Diagnosis not present

## 2015-01-06 DIAGNOSIS — Z79899 Other long term (current) drug therapy: Secondary | ICD-10-CM | POA: Diagnosis not present

## 2015-01-18 ENCOUNTER — Other Ambulatory Visit (INDEPENDENT_AMBULATORY_CARE_PROVIDER_SITE_OTHER): Payer: Medicare Other | Admitting: *Deleted

## 2015-01-18 DIAGNOSIS — E785 Hyperlipidemia, unspecified: Secondary | ICD-10-CM | POA: Diagnosis not present

## 2015-01-18 LAB — LIPID PANEL
Cholesterol: 153 mg/dL (ref 0–200)
HDL: 43.9 mg/dL (ref >39.00)
LDL Cholesterol: 91 mg/dL (ref 0–99)
NonHDL: 109.1
Total CHOL/HDL Ratio: 3
Triglycerides: 93 mg/dL (ref 0.0–149.0)
VLDL: 18.6 mg/dL (ref 0.0–40.0)

## 2015-01-18 LAB — HEPATIC FUNCTION PANEL
ALBUMIN: 3.8 g/dL (ref 3.5–5.2)
ALK PHOS: 103 U/L (ref 39–117)
ALT: 22 U/L (ref 0–35)
AST: 23 U/L (ref 0–37)
Bilirubin, Direct: 0.1 mg/dL (ref 0.0–0.3)
TOTAL PROTEIN: 7.1 g/dL (ref 6.0–8.3)
Total Bilirubin: 0.6 mg/dL (ref 0.2–1.2)

## 2015-01-24 DIAGNOSIS — H3531 Nonexudative age-related macular degeneration: Secondary | ICD-10-CM | POA: Diagnosis not present

## 2015-01-24 DIAGNOSIS — H25813 Combined forms of age-related cataract, bilateral: Secondary | ICD-10-CM | POA: Diagnosis not present

## 2015-01-28 DIAGNOSIS — M1712 Unilateral primary osteoarthritis, left knee: Secondary | ICD-10-CM | POA: Diagnosis not present

## 2015-02-09 DIAGNOSIS — Z79899 Other long term (current) drug therapy: Secondary | ICD-10-CM | POA: Diagnosis not present

## 2015-03-12 ENCOUNTER — Other Ambulatory Visit: Payer: Self-pay | Admitting: Cardiology

## 2015-03-14 ENCOUNTER — Other Ambulatory Visit: Payer: Self-pay | Admitting: Family Medicine

## 2015-03-21 ENCOUNTER — Other Ambulatory Visit: Payer: Self-pay | Admitting: Cardiology

## 2015-03-21 ENCOUNTER — Other Ambulatory Visit: Payer: Self-pay | Admitting: Family Medicine

## 2015-03-21 DIAGNOSIS — I319 Disease of pericardium, unspecified: Secondary | ICD-10-CM

## 2015-03-21 DIAGNOSIS — I10 Essential (primary) hypertension: Secondary | ICD-10-CM

## 2015-03-21 DIAGNOSIS — F411 Generalized anxiety disorder: Secondary | ICD-10-CM

## 2015-03-21 DIAGNOSIS — F3289 Other specified depressive episodes: Secondary | ICD-10-CM

## 2015-03-22 MED ORDER — SERTRALINE HCL 100 MG PO TABS: 200.0000 mg | ORAL_TABLET | Freq: Every day | ORAL | Status: DC

## 2015-03-22 MED ORDER — CHLORTHALIDONE 25 MG PO TABS: ORAL_TABLET | ORAL | Status: DC

## 2015-03-22 NOTE — Addendum Note (Signed)
Addended by: Elwyn Reach A on: 03/22/2015 09:03 AM   Modules accepted: Orders

## 2015-03-22 NOTE — Telephone Encounter (Signed)
Clonazepam called in. Sending RFs of setraline and chlorthalidone

## 2015-04-01 ENCOUNTER — Encounter: Payer: Self-pay | Admitting: *Deleted

## 2015-04-11 ENCOUNTER — Telehealth: Payer: Self-pay

## 2015-04-11 DIAGNOSIS — I319 Disease of pericardium, unspecified: Secondary | ICD-10-CM

## 2015-04-11 DIAGNOSIS — Z79899 Other long term (current) drug therapy: Secondary | ICD-10-CM | POA: Diagnosis not present

## 2015-04-11 DIAGNOSIS — I10 Essential (primary) hypertension: Secondary | ICD-10-CM

## 2015-04-11 MED ORDER — CHLORTHALIDONE 25 MG PO TABS: ORAL_TABLET | ORAL | Status: DC

## 2015-04-11 NOTE — Telephone Encounter (Signed)
Can we do this? Please advise.

## 2015-04-11 NOTE — Telephone Encounter (Signed)
I provided refill but patient needs to return to clinic for blood pressure visit. The refill is for 30 days only. Please let patient know it was sent to pharmacy.

## 2015-04-11 NOTE — Telephone Encounter (Signed)
Pt states she needs her bp med Cbhlorphalidone,she has not had this filled by [place], But needs today   Best phone [phone]   [facility] [place]

## 2015-04-12 ENCOUNTER — Ambulatory Visit (INDEPENDENT_AMBULATORY_CARE_PROVIDER_SITE_OTHER): Payer: Medicare Other | Admitting: Family Medicine

## 2015-04-12 VITALS — BP 136/80 | HR 78 | Temp 98.0°F | Resp 16 | Ht 60.0 in | Wt 151.2 lb

## 2015-04-12 DIAGNOSIS — R350 Frequency of micturition: Secondary | ICD-10-CM | POA: Diagnosis not present

## 2015-04-12 DIAGNOSIS — Z1239 Encounter for other screening for malignant neoplasm of breast: Secondary | ICD-10-CM | POA: Diagnosis not present

## 2015-04-12 DIAGNOSIS — M8580 Other specified disorders of bone density and structure, unspecified site: Secondary | ICD-10-CM | POA: Diagnosis not present

## 2015-04-12 DIAGNOSIS — Z13820 Encounter for screening for osteoporosis: Secondary | ICD-10-CM | POA: Diagnosis not present

## 2015-04-12 DIAGNOSIS — Z23 Encounter for immunization: Secondary | ICD-10-CM | POA: Diagnosis not present

## 2015-04-12 DIAGNOSIS — E871 Hypo-osmolality and hyponatremia: Secondary | ICD-10-CM | POA: Diagnosis not present

## 2015-04-12 DIAGNOSIS — R1314 Dysphagia, pharyngoesophageal phase: Secondary | ICD-10-CM | POA: Diagnosis not present

## 2015-04-12 DIAGNOSIS — N3000 Acute cystitis without hematuria: Secondary | ICD-10-CM

## 2015-04-12 DIAGNOSIS — I10 Essential (primary) hypertension: Secondary | ICD-10-CM | POA: Diagnosis not present

## 2015-04-12 LAB — POCT UA - MICROSCOPIC ONLY
BACTERIA, U MICROSCOPIC: NEGATIVE
CASTS, UR, LPF, POC: NEGATIVE
Crystals, Ur, HPF, POC: NEGATIVE
Epithelial cells, urine per micros: NEGATIVE
Mucus, UA: NEGATIVE
YEAST UA: NEGATIVE

## 2015-04-12 LAB — POCT URINALYSIS DIPSTICK
BILIRUBIN UA: NEGATIVE
Glucose, UA: NEGATIVE
Ketones, UA: NEGATIVE
NITRITE UA: NEGATIVE
Protein, UA: NEGATIVE
Spec Grav, UA: 1.01
Urobilinogen, UA: 0.2
pH, UA: 7

## 2015-04-12 NOTE — Telephone Encounter (Signed)
Spoke with pt, she was seen today.

## 2015-04-12 NOTE — Progress Notes (Addendum)
Urgent Medical and [facility] [address] [place] [address] [phone]  Date:  04/12/2015   Name:  Katherine Wang   DOB:  Mar 01, 1946   MRN:  [id number]  PCP:  Lamar Blinks, MD    Chief Complaint: Urinary Frequency; Medication Refill; and Abnormal Lab   History of Present Illness:  Katherine Wang is a 69 y.o. very pleasant female patient who presents with the following:  Here today with a couple of concerns: she has noted urinary frequency, sometimes also with low volume. She has not noted dysuria however.  She has noted these sx for approx 2 months.  Not sure if she might have a UTI  Also, Dr. Arlean Hopping office had contacted her regarding low K and low sodium.  She had repeat labs at [facility] but is not yet sure of the results.  I reviewed her original labs and reassured her that K and Na were only minimally low.   She takes valsartan and 1/2 chlorthalidone for her BP. These medications were given to her in [place], she does not have an active rx here for these medications.  Also, she has actually been out of the chlorthal for a couple of weeks.    She is on aspirin and has noted easier bruising as of late- no other abnormal bleeding  She does have a history osteopenia she thinks from a prior dexa scan. She is due for a repeat Dexa. Also overdue for her mammogram and has not yet had her zostavax  She also mentions difficulty swallowing off an on for about 6 months, both solids and liquids.  "They get stuck or go down the wrong pipe." Dr. Fuller Plan is her GI doctor.    Wt Readings from Last 3 Encounters:  04/12/15 151 lb 3.2 oz (68.584 kg)  12/11/14 151 lb 2 oz (68.55 kg)  11/15/14 151 lb (68.493 kg)     BP Readings from Last 3 Encounters:  04/12/15 142/80  12/11/14 140/72  11/15/14 124/64      Patient Active Problem List   Diagnosis Date Noted  . Hyperlipidemia 11/15/2014  . Rheumatoid arthritis 09/01/2014  . Pericarditis 04/24/2011  . Aortic valve  disorder 02/14/2009  . ELECTROCARDIOGRAM, ABNORMAL 02/14/2009  . PERNICIOUS ANEMIA 07/10/2007  . CANDIDIASIS, ORAL 07/03/2007  . DEPRESSION 07/03/2007  . MALAISE AND FATIGUE 07/03/2007  . HYPOTHYROIDISM 04/30/2007  . HYPERTENSION 04/30/2007  . Aneurysm of thoracic aorta 02/13/2006    Past Medical History  Diagnosis Date  . Allergy   . Hyperlipidemia   . Thyroid disease   . Arthritis   . Anxiety   . Depression   . Hypertension   . Aortic insufficiency   . Murmur, heart   . Stroke     per pt. mild stroke  . Cancer   . Cataract   . Macular degeneration     Past Surgical History  Procedure Laterality Date  . Cholecystectomy    . Colonoscopy      30 + years ago, unsure of where  . Abdominal hysterectomy    . Lasik    . Cardiac catheterization    . Nasal septum surgery    . Melanoma excision    . Appendectomy    . Eye surgery    . Tubal ligation      History  Substance Use Topics  . Smoking status: Former Research scientist (life sciences)  . Smokeless tobacco: Never Used  . Alcohol Use: No    Family History  Problem  Relation Age of Onset  . Heart disease Mother   . Hypertension Mother   . Mental illness Mother   . Heart disease Father   . Hypertension Father   . Mental illness Father   . Hyperlipidemia Sister   . Hypertension Sister   . Hypertension Sister   . Diabetes Sister     No Known Allergies  Medication list has been reviewed and updated.  Current Outpatient Prescriptions on File Prior to Visit  Medication Sig Dispense Refill  . albuterol (PROVENTIL HFA;VENTOLIN HFA) 108 (90 BASE) MCG/ACT inhaler Inhale 2 puffs into the lungs daily as needed for wheezing or shortness of breath.    Marland Kitchen aspirin EC 81 MG tablet Take 1 tablet (81 mg total) by mouth daily.    Marland Kitchen atorvastatin (LIPITOR) 40 MG tablet TAKE 1 TABLET BY MOUTH EVERY DAY 30 tablet 6  . buPROPion (WELLBUTRIN) 100 MG tablet Take 100 mg by mouth daily.      . chlorthalidone (HYGROTON) 25 MG tablet Take 1/2 tablet daily  30 tablet 0  . clonazePAM (KLONOPIN) 0.5 MG tablet Take 1 tablet (0.5 mg total) by mouth 2 (two) times daily as needed. 60 tablet 1  . cycloSPORINE (RESTASIS) 0.05 % ophthalmic emulsion 1 drop 2 (two) times daily.    . fluticasone (VERAMYST) 27.5 MCG/SPRAY nasal spray Place 2 sprays into the nose daily. 10 g 2  . levothyroxine (SYNTHROID, LEVOTHROID) 100 MCG tablet Take 1 tablet (100 mcg total) by mouth daily before breakfast. 30 tablet 5  . naproxen sodium (ANAPROX) 220 MG tablet Take 220 mg by mouth as needed.      . sertraline (ZOLOFT) 100 MG tablet Take 2 tablets (200 mg total) by mouth daily. 60 tablet 2  . valsartan (DIOVAN) 160 MG tablet TAKE 1 TABLET BY MOUTH EVERY DAY 30 tablet 9  . Cholecalciferol (VITAMIN D-3) 1000 UNITS CAPS Take 1,000 Units by mouth.    . Multiple Vitamins-Minerals (PRESERVISION AREDS PO) Take by mouth.     No current facility-administered medications on file prior to visit.    Review of Systems:  As per HPI- otherwise negative.   Physical Examination: Filed Vitals:   04/12/15 0824  BP: 142/80  Pulse: 78  Temp: 98 F (36.7 C)  Resp: 16   Filed Vitals:   04/12/15 0824  Height: 5' (1.524 m)  Weight: 151 lb 3.2 oz (68.584 kg)   Body mass index is 29.53 kg/(m^2). Ideal Body Weight: Weight in (lb) to have BMI = 25: 127.7  GEN: WDWN, NAD, Non-toxic, A & O x 3, looks well HEENT: Atraumatic, Normocephalic. Neck supple. No masses, No LAD. Ears and Nose: No external deformity. CV: RRR, No M/G/R. No JVD. No thrill. No extra heart sounds. PULM: CTA B, no wheezes, crackles, rhonchi. No retractions. No resp. distress. No accessory muscle use. ABD: S, NT, ND, +BS. No rebound. No HSM. EXTR: No c/c/e NEURO Normal gait.  PSYCH: Normally interactive. Conversant. Not depressed or anxious appearing.  Calm demeanor.   Results for orders placed or performed in visit on 04/12/15  POCT UA - Microscopic Only  Result Value Ref Range   WBC, Ur, HPF, POC 1-2    RBC,  urine, microscopic 1-2    Bacteria, U Microscopic neg    Mucus, UA neg    Epithelial cells, urine per micros neg    Crystals, Ur, HPF, POC neg    Casts, Ur, LPF, POC neg    Yeast, UA neg   POCT  urinalysis dipstick  Result Value Ref Range   Color, UA yellow    Clarity, UA clear    Glucose, UA neg    Bilirubin, UA neg    Ketones, UA neg    Spec Grav, UA 1.010    Blood, UA small    pH, UA 7.0    Protein, UA neg    Urobilinogen, UA 0.2    Nitrite, UA neg    Leukocytes, UA small (1+) (A) Negative     Assessment and Plan: Urinary frequency - Plan: POCT UA - Microscopic Only, POCT urinalysis dipstick, Urine culture  Screening for osteoporosis  Osteopenia  Breast cancer screening  Hyponatremia  Essential hypertension  Immunization due  Dysphagia, pharyngoesophageal phase  Await urine culture but suspect she does not have a UTI She will schedule her mammogram and dexa scan- I tried to place orders but could not due to medicare issue.   Referral back to Dr. Fuller Plan regarding her swallowing difficulty Her BP is acceptable without the chlorthalidone- will DC this for now, especially in light of recent low K and Na Signed Lamar Blinks, MD  Received her urine culture on 6/16; low growth of GBS.  She does feel that she has UTI sx and desires treatment. We can treat this with amox.  She generally takes her methotrexate on wednesdays- we will treat her for 5 days starting today (thursday) to minimize risk of toxicity  Results for orders placed or performed in visit on 04/12/15  Urine culture  Result Value Ref Range   Colony Count 15,000 COLONIES/ML    Organism ID, Bacteria GROUP B STREP (S.AGALACTIAE) ISOLATED   POCT UA - Microscopic Only  Result Value Ref Range   WBC, Ur, HPF, POC 1-2    RBC, urine, microscopic 1-2    Bacteria, U Microscopic neg    Mucus, UA neg    Epithelial cells, urine per micros neg    Crystals, Ur, HPF, POC neg    Casts, Ur, LPF, POC neg     Yeast, UA neg   POCT urinalysis dipstick  Result Value Ref Range   Color, UA yellow    Clarity, UA clear    Glucose, UA neg    Bilirubin, UA neg    Ketones, UA neg    Spec Grav, UA 1.010    Blood, UA small    pH, UA 7.0    Protein, UA neg    Urobilinogen, UA 0.2    Nitrite, UA neg    Leukocytes, UA small (1+) (A) Negative

## 2015-04-12 NOTE — Patient Instructions (Signed)
You can stay off the chlorthalidone for now- please keep an eye on your BP at home We are looking for it to be < 140/90; if it is regularly running higher than this please give me a call  Please call [facility] and ask to schedule a mammogram and bone density scan I tried to order these but sometimes medicare is tricky about me placing this order.    Address: [address], [place], [place] [address]  [phone]  You can also get a shingles shot at your convenience.   However please ask Dr. Keturah Barre to make sure it is ok for you to get this vaccine with your other medications for RA

## 2015-04-14 ENCOUNTER — Encounter: Payer: Self-pay | Admitting: Gastroenterology

## 2015-04-14 LAB — URINE CULTURE: Colony Count: 15000

## 2015-04-14 MED ORDER — AMOXICILLIN 500 MG PO CAPS: 500.0000 mg | ORAL_CAPSULE | Freq: Two times a day (BID) | ORAL | Status: DC

## 2015-04-14 NOTE — Addendum Note (Signed)
Addended by: Lamar Blinks C on: 04/14/2015 02:23 PM   Modules accepted: Orders

## 2015-04-16 ENCOUNTER — Other Ambulatory Visit: Payer: Self-pay | Admitting: Family Medicine

## 2015-04-16 ENCOUNTER — Encounter: Payer: Self-pay | Admitting: Family Medicine

## 2015-04-16 DIAGNOSIS — E876 Hypokalemia: Secondary | ICD-10-CM

## 2015-04-21 DIAGNOSIS — Z09 Encounter for follow-up examination after completed treatment for conditions other than malignant neoplasm: Secondary | ICD-10-CM | POA: Diagnosis not present

## 2015-04-21 DIAGNOSIS — M79641 Pain in right hand: Secondary | ICD-10-CM | POA: Diagnosis not present

## 2015-04-21 DIAGNOSIS — M2550 Pain in unspecified joint: Secondary | ICD-10-CM | POA: Diagnosis not present

## 2015-04-21 DIAGNOSIS — M719 Bursopathy, unspecified: Secondary | ICD-10-CM | POA: Diagnosis not present

## 2015-04-21 DIAGNOSIS — M0579 Rheumatoid arthritis with rheumatoid factor of multiple sites without organ or systems involvement: Secondary | ICD-10-CM | POA: Diagnosis not present

## 2015-04-25 ENCOUNTER — Telehealth: Payer: Self-pay

## 2015-04-25 DIAGNOSIS — E876 Hypokalemia: Secondary | ICD-10-CM

## 2015-04-25 NOTE — Telephone Encounter (Signed)
Patient is calling to leave a message for Dr. Lorelei Pont. She recently got her potassium level checked with her RA and it was 3.2. Patient would like to know if Dr. Lorelei Pont could use the results from the RA's office instead of coming in again to get more blood work done. She saw Dr. Council Mechanic. Please call![phone]

## 2015-04-26 MED ORDER — POTASSIUM CHLORIDE ER 20 MEQ PO TBCR: 20.0000 meq | EXTENDED_RELEASE_TABLET | Freq: Every day | ORAL | Status: DC

## 2015-04-26 NOTE — Telephone Encounter (Signed)
Called Dr. Arlean Hopping office to confirm her K level- per staff K was 3.2 on 6/16.  Asked them to fax me her labs  Called pt- I will start her on Kdur 20 meq and she will come by for a lab draw only in 10 days.  If this problem persists we will look further.

## 2015-05-03 ENCOUNTER — Telehealth: Payer: Self-pay

## 2015-05-03 DIAGNOSIS — E2839 Other primary ovarian failure: Secondary | ICD-10-CM

## 2015-05-03 NOTE — Telephone Encounter (Signed)
Dr. Lorelei Pont    Patient needs bone density for order at [facility] center   [phone]

## 2015-05-04 NOTE — Telephone Encounter (Signed)
Taken care of, called and let her know

## 2015-05-05 DIAGNOSIS — M19271 Secondary osteoarthritis, right ankle and foot: Secondary | ICD-10-CM | POA: Diagnosis not present

## 2015-05-05 DIAGNOSIS — M79671 Pain in right foot: Secondary | ICD-10-CM | POA: Diagnosis not present

## 2015-05-05 DIAGNOSIS — M2011 Hallux valgus (acquired), right foot: Secondary | ICD-10-CM | POA: Diagnosis not present

## 2015-05-06 ENCOUNTER — Other Ambulatory Visit (INDEPENDENT_AMBULATORY_CARE_PROVIDER_SITE_OTHER): Payer: Medicare Other

## 2015-05-06 ENCOUNTER — Other Ambulatory Visit: Payer: Self-pay

## 2015-05-06 DIAGNOSIS — Z1231 Encounter for screening mammogram for malignant neoplasm of breast: Secondary | ICD-10-CM

## 2015-05-06 DIAGNOSIS — E876 Hypokalemia: Secondary | ICD-10-CM | POA: Diagnosis not present

## 2015-05-06 LAB — BASIC METABOLIC PANEL
BUN: 16 mg/dL (ref 6–23)
CALCIUM: 9.1 mg/dL (ref 8.4–10.5)
CO2: 24 mEq/L (ref 19–32)
Chloride: 101 mEq/L (ref 96–112)
Creat: 0.83 mg/dL (ref 0.50–1.10)
Glucose, Bld: 103 mg/dL — ABNORMAL HIGH (ref 70–99)
Potassium: 4.6 mEq/L (ref 3.5–5.3)
SODIUM: 136 meq/L (ref 135–145)

## 2015-06-06 ENCOUNTER — Other Ambulatory Visit: Payer: Self-pay | Admitting: Family Medicine

## 2015-06-10 ENCOUNTER — Ambulatory Visit
Admission: RE | Admit: 2015-06-10 | Discharge: 2015-06-10 | Disposition: A | Discharge status: Home / Self Care | Payer: Medicare Other | Source: Ambulatory Visit | Attending: Family Medicine | Admitting: Family Medicine

## 2015-06-10 ENCOUNTER — Ambulatory Visit
Admission: RE | Admit: 2015-06-10 | Discharge: 2015-06-10 | Disposition: A | Discharge status: Home / Self Care | Payer: Medicare Other | Source: Ambulatory Visit

## 2015-06-10 DIAGNOSIS — Z1231 Encounter for screening mammogram for malignant neoplasm of breast: Secondary | ICD-10-CM

## 2015-06-10 DIAGNOSIS — M85852 Other specified disorders of bone density and structure, left thigh: Secondary | ICD-10-CM | POA: Diagnosis not present

## 2015-06-10 DIAGNOSIS — E2839 Other primary ovarian failure: Secondary | ICD-10-CM

## 2015-06-12 ENCOUNTER — Encounter: Payer: Self-pay | Admitting: Family Medicine

## 2015-06-17 DIAGNOSIS — Z79899 Other long term (current) drug therapy: Secondary | ICD-10-CM | POA: Diagnosis not present

## 2015-06-21 ENCOUNTER — Ambulatory Visit (INDEPENDENT_AMBULATORY_CARE_PROVIDER_SITE_OTHER): Payer: Medicare Other | Admitting: Gastroenterology

## 2015-06-21 ENCOUNTER — Encounter: Payer: Self-pay | Admitting: Gastroenterology

## 2015-06-21 ENCOUNTER — Other Ambulatory Visit (HOSPITAL_COMMUNITY): Payer: Self-pay | Admitting: Gastroenterology

## 2015-06-21 VITALS — BP 154/86 | HR 80 | Ht 60.0 in | Wt 154.4 lb

## 2015-06-21 DIAGNOSIS — R1314 Dysphagia, pharyngoesophageal phase: Secondary | ICD-10-CM

## 2015-06-21 DIAGNOSIS — Z86010 Personal history of colonic polyps: Secondary | ICD-10-CM

## 2015-06-21 NOTE — Assessment & Plan Note (Addendum)
Three-year interval surveillance colonoscopy recommended in May 2015 however she did not return. Schedule colonoscopy in the near future. Await results of current dysphagia evaluation to see if she will need an EGD. Colonoscopy can be performed at the same time.

## 2015-06-21 NOTE — Patient Instructions (Signed)
You have been scheduled for a Barium Esophogram at [facility] (1st floor of the hospital) on 06/29/15 at 10:00am. Please arrive 15 minutes prior to your appointment for registration. Make certain not to have anything to eat or drink 6 hours prior to your test. If you need to reschedule for any reason, please contact radiology at [phone] to do so. __________________________________________________________________ A barium swallow is an examination that concentrates on views of the esophagus. This tends to be a double contrast exam (barium and two liquids which, when combined, create a gas to distend the wall of the oesophagus) or single contrast (non-ionic iodine based). The study is usually tailored to your symptoms so a good history is essential. Attention is paid during the study to the form, structure and configuration of the esophagus, looking for functional disorders (such as aspiration, dysphagia, achalasia, motility and reflux) EXAMINATION You may be asked to change into a gown, depending on the type of swallow being performed. A radiologist and radiographer will perform the procedure. The radiologist will advise you of the type of contrast selected for your procedure and direct you during the exam. You will be asked to stand, sit or lie in several different positions and to hold a small amount of fluid in your mouth before being asked to swallow while the imaging is performed .In some instances you may be asked to swallow barium coated marshmallows to assess the motility of a solid food bolus. The exam can be recorded as a digital or video fluoroscopy procedure. POST PROCEDURE It will take 1-2 days for the barium to pass through your system. To facilitate this, it is important, unless otherwise directed, to increase your fluids for the next 24-48hrs and to resume your normal diet.  This test typically takes about 30 minutes to  perform. __________________________________________________________________________________  Thank you for choosing me and [facility].  Pricilla Riffle. Dagoberto Ligas., MD., Marval Regal  cc: Lamar Blinks, MD

## 2015-06-21 NOTE — Progress Notes (Signed)
    History of Present Illness: This is a 69 -year-old female referred by Copland, Gay Filler, MD for the evaluation of dysphagia, choking. She relates about a one-year history of intermittent episodes of choking and coughing while swallowing either liquids or solids. Symptoms occur about once or twice per month, and although they have not increased in frequency, the severity the symptoms have increased over the past few months. She relates very rare episodes of heartburn occurring about once per month.  Review of Systems: Pertinent positive and negative review of systems were noted in the above HPI section. All other review of systems were otherwise negative.  Current Medications, Allergies, Past Medical History, Past Surgical History, Family History and Social History were reviewed in [employer] record.  Physical Exam: General: Well developed, well nourished, no acute distress Head: Normocephalic and atraumatic Eyes:  sclerae anicteric, EOMI Ears: Normal auditory acuity Mouth: No deformity or lesions Neck: Supple, no masses or thyromegaly Lungs: Clear throughout to auscultation Heart: Regular rate and rhythm; no murmurs, rubs or bruits Abdomen: Soft, non tender and non distended. No masses, hepatosplenomegaly or hernias noted. Normal Bowel sounds Rectal: deferred to colonoscopy Musculoskeletal: Symmetrical with no gross deformities  Skin: No lesions on visible extremities Pulses:  Normal pulses noted Extremities: No clubbing, cyanosis, edema or deformities noted Neurological: Alert oriented x 4, grossly nonfocal Cervical Nodes:  No significant cervical adenopathy Inguinal Nodes: No significant inguinal adenopathy Psychological:  Alert and cooperative. Normal mood and affect  Assessment and Recommendations:  1. Dysphagia and choking. Rule out oropharyngeal and esophageal dysphagia. Schedule modified barium swallow study and barium esophagram. May need upper  endoscopy for further evaluation.  cc: [name], MD [address] [place], [place] [address]

## 2015-06-23 DIAGNOSIS — Z79899 Other long term (current) drug therapy: Secondary | ICD-10-CM | POA: Diagnosis not present

## 2015-06-23 DIAGNOSIS — M797 Fibromyalgia: Secondary | ICD-10-CM | POA: Diagnosis not present

## 2015-06-23 DIAGNOSIS — M542 Cervicalgia: Secondary | ICD-10-CM | POA: Diagnosis not present

## 2015-06-23 DIAGNOSIS — M0579 Rheumatoid arthritis with rheumatoid factor of multiple sites without organ or systems involvement: Secondary | ICD-10-CM | POA: Diagnosis not present

## 2015-06-24 ENCOUNTER — Other Ambulatory Visit: Payer: Self-pay | Admitting: Family Medicine

## 2015-06-24 ENCOUNTER — Telehealth: Payer: Self-pay

## 2015-06-24 ENCOUNTER — Encounter: Payer: Self-pay | Admitting: Family Medicine

## 2015-06-24 NOTE — Telephone Encounter (Signed)
[facility] on [address] is calling to request that a patient get a refill for klonopin. Please send to [facility] [address]! They [facility]

## 2015-06-25 NOTE — Telephone Encounter (Signed)
Called in Rx to [facility]

## 2015-06-27 ENCOUNTER — Telehealth: Payer: Self-pay

## 2015-06-27 DIAGNOSIS — I10 Essential (primary) hypertension: Secondary | ICD-10-CM

## 2015-06-27 MED ORDER — CHLORTHALIDONE 25 MG PO TABS: ORAL_TABLET | ORAL | Status: DC

## 2015-06-27 NOTE — Telephone Encounter (Signed)
Pt states since going off her Chlorphaladone,her bp is running high,taken At 530 am it was 160/85,if she needs to go back on med please call to Pharmacy  Best phone for pt is [place]

## 2015-06-27 NOTE — Telephone Encounter (Signed)
Should pt RTC?

## 2015-06-27 NOTE — Telephone Encounter (Signed)
She had been on chlorthalidone 25, 1/2 tab daily. Called her back- she has been watching her BP and has noted it running 145- 160/85 She would like to go back on her chlorthalidone which is fine.  Will send to her drug store.  Recent lytes looked fine Results for orders placed or performed in visit on 38/88/28  Basic metabolic panel  Result Value Ref Range   Sodium 136 135 - 145 mEq/L   Potassium 4.6 3.5 - 5.3 mEq/L   Chloride 101 96 - 112 mEq/L   CO2 24 19 - 32 mEq/L   Glucose, Bld 103 (H) 70 - 99 mg/dL   BUN 16 6 - 23 mg/dL   Creat 0.83 0.50 - 1.10 mg/dL   Calcium 9.1 8.4 - 10.5 mg/dL

## 2015-06-29 ENCOUNTER — Ambulatory Visit (HOSPITAL_COMMUNITY): Payer: Medicare Other

## 2015-06-29 ENCOUNTER — Ambulatory Visit (HOSPITAL_COMMUNITY)
Admission: RE | Admit: 2015-06-29 | Discharge: 2015-06-29 | Disposition: A | Discharge status: Home / Self Care | Payer: Medicare Other | Source: Ambulatory Visit | Attending: Gastroenterology | Admitting: Gastroenterology

## 2015-06-29 ENCOUNTER — Ambulatory Visit (HOSPITAL_COMMUNITY): Admission: RE | Admit: 2015-06-29 | Payer: Medicare Other | Source: Ambulatory Visit

## 2015-06-29 DIAGNOSIS — R1310 Dysphagia, unspecified: Secondary | ICD-10-CM | POA: Insufficient documentation

## 2015-06-29 DIAGNOSIS — R1314 Dysphagia, pharyngoesophageal phase: Secondary | ICD-10-CM

## 2015-06-29 DIAGNOSIS — I712 Thoracic aortic aneurysm, without rupture: Secondary | ICD-10-CM | POA: Diagnosis not present

## 2015-06-29 DIAGNOSIS — K449 Diaphragmatic hernia without obstruction or gangrene: Secondary | ICD-10-CM | POA: Insufficient documentation

## 2015-06-29 DIAGNOSIS — K224 Dyskinesia of esophagus: Secondary | ICD-10-CM | POA: Diagnosis not present

## 2015-06-29 NOTE — Progress Notes (Signed)
MBSS complete. Full report located under chart review in imaging section.  Delecia Vastine MA, CCC-SLP [phone]   

## 2015-06-30 DIAGNOSIS — M0579 Rheumatoid arthritis with rheumatoid factor of multiple sites without organ or systems involvement: Secondary | ICD-10-CM | POA: Diagnosis not present

## 2015-07-04 ENCOUNTER — Other Ambulatory Visit: Payer: Self-pay | Admitting: Family Medicine

## 2015-07-06 ENCOUNTER — Other Ambulatory Visit: Payer: Self-pay | Admitting: Family Medicine

## 2015-07-08 DIAGNOSIS — Z85820 Personal history of malignant melanoma of skin: Secondary | ICD-10-CM | POA: Diagnosis not present

## 2015-07-08 DIAGNOSIS — L814 Other melanin hyperpigmentation: Secondary | ICD-10-CM | POA: Diagnosis not present

## 2015-07-08 DIAGNOSIS — Z85828 Personal history of other malignant neoplasm of skin: Secondary | ICD-10-CM | POA: Diagnosis not present

## 2015-07-08 DIAGNOSIS — L821 Other seborrheic keratosis: Secondary | ICD-10-CM | POA: Diagnosis not present

## 2015-07-27 ENCOUNTER — Other Ambulatory Visit: Payer: Self-pay | Admitting: Family Medicine

## 2015-08-01 ENCOUNTER — Other Ambulatory Visit: Payer: Self-pay | Admitting: Family Medicine

## 2015-08-23 DIAGNOSIS — Z79899 Other long term (current) drug therapy: Secondary | ICD-10-CM | POA: Diagnosis not present

## 2015-08-29 ENCOUNTER — Other Ambulatory Visit: Payer: Self-pay | Admitting: Physician Assistant

## 2015-08-31 ENCOUNTER — Other Ambulatory Visit: Payer: Self-pay | Admitting: Physician Assistant

## 2015-08-31 NOTE — Telephone Encounter (Signed)
Dr Lorelei Pont, you saw pt for check up in June, but not for GAD since Feb. Do you want to give RFs or RTC?

## 2015-09-01 DIAGNOSIS — D225 Melanocytic nevi of trunk: Secondary | ICD-10-CM | POA: Diagnosis not present

## 2015-09-01 DIAGNOSIS — D2262 Melanocytic nevi of left upper limb, including shoulder: Secondary | ICD-10-CM | POA: Diagnosis not present

## 2015-09-01 DIAGNOSIS — L821 Other seborrheic keratosis: Secondary | ICD-10-CM | POA: Diagnosis not present

## 2015-09-01 DIAGNOSIS — D2271 Melanocytic nevi of right lower limb, including hip: Secondary | ICD-10-CM | POA: Diagnosis not present

## 2015-09-01 DIAGNOSIS — Z85820 Personal history of malignant melanoma of skin: Secondary | ICD-10-CM | POA: Diagnosis not present

## 2015-09-01 DIAGNOSIS — D2261 Melanocytic nevi of right upper limb, including shoulder: Secondary | ICD-10-CM | POA: Diagnosis not present

## 2015-09-01 DIAGNOSIS — D1801 Hemangioma of skin and subcutaneous tissue: Secondary | ICD-10-CM | POA: Diagnosis not present

## 2015-09-01 DIAGNOSIS — D235 Other benign neoplasm of skin of trunk: Secondary | ICD-10-CM | POA: Diagnosis not present

## 2015-09-01 DIAGNOSIS — Z85828 Personal history of other malignant neoplasm of skin: Secondary | ICD-10-CM | POA: Diagnosis not present

## 2015-09-15 ENCOUNTER — Other Ambulatory Visit: Payer: Self-pay | Admitting: Family Medicine

## 2015-09-15 MED ORDER — CLONAZEPAM 0.5 MG PO TABS: 0.5000 mg | ORAL_TABLET | Freq: Two times a day (BID) | ORAL | Status: DC | PRN

## 2015-09-26 ENCOUNTER — Other Ambulatory Visit: Payer: Self-pay | Admitting: Physician Assistant

## 2015-09-29 DIAGNOSIS — M1712 Unilateral primary osteoarthritis, left knee: Secondary | ICD-10-CM | POA: Diagnosis not present

## 2015-09-29 DIAGNOSIS — M0579 Rheumatoid arthritis with rheumatoid factor of multiple sites without organ or systems involvement: Secondary | ICD-10-CM | POA: Diagnosis not present

## 2015-09-29 DIAGNOSIS — Z09 Encounter for follow-up examination after completed treatment for conditions other than malignant neoplasm: Secondary | ICD-10-CM | POA: Diagnosis not present

## 2015-09-29 DIAGNOSIS — M797 Fibromyalgia: Secondary | ICD-10-CM | POA: Diagnosis not present

## 2015-09-30 ENCOUNTER — Other Ambulatory Visit: Payer: Self-pay | Admitting: Physician Assistant

## 2015-09-30 ENCOUNTER — Ambulatory Visit (INDEPENDENT_AMBULATORY_CARE_PROVIDER_SITE_OTHER): Payer: Medicare Other | Admitting: Family Medicine

## 2015-09-30 VITALS — BP 140/82 | HR 93 | Temp 98.3°F | Resp 18 | Ht 61.0 in | Wt 154.6 lb

## 2015-09-30 DIAGNOSIS — I10 Essential (primary) hypertension: Secondary | ICD-10-CM

## 2015-09-30 DIAGNOSIS — E039 Hypothyroidism, unspecified: Secondary | ICD-10-CM | POA: Diagnosis not present

## 2015-09-30 DIAGNOSIS — Z119 Encounter for screening for infectious and parasitic diseases, unspecified: Secondary | ICD-10-CM | POA: Diagnosis not present

## 2015-09-30 LAB — COMPREHENSIVE METABOLIC PANEL
ALK PHOS: 114 U/L (ref 33–130)
ALT: 14 U/L (ref 6–29)
AST: 16 U/L (ref 10–35)
Albumin: 4.4 g/dL (ref 3.6–5.1)
BILIRUBIN TOTAL: 0.4 mg/dL (ref 0.2–1.2)
BUN: 17 mg/dL (ref 7–25)
CO2: 21 mmol/L (ref 20–31)
Calcium: 9.5 mg/dL (ref 8.6–10.4)
Chloride: 103 mmol/L (ref 98–110)
Creat: 0.78 mg/dL (ref 0.60–0.93)
GLUCOSE: 80 mg/dL (ref 65–99)
Potassium: 4.2 mmol/L (ref 3.5–5.3)
SODIUM: 137 mmol/L (ref 135–146)
Total Protein: 7.1 g/dL (ref 6.1–8.1)

## 2015-09-30 LAB — TSH: TSH: 0.938 u[IU]/mL (ref 0.350–4.500)

## 2015-09-30 MED ORDER — SYNTHROID 100 MCG PO TABS: ORAL_TABLET | ORAL | Status: DC

## 2015-09-30 NOTE — Progress Notes (Signed)
[facility] [address] [place] [address] [phone]  Date:  09/30/2015   Name:  Katherine Wang   DOB:  1946/05/19   MRN:  [id number]  PCP:  Lamar Blinks, MD    Chief Complaint: Medication Refill   History of Present Illness:  Katherine Wang is a 69 y.o. very pleasant female patient who presents with the following:  She needs some refills She stopped her wellbutrin- she feels better without it  She has started arava for her RA She was dx with fibromyalgia.   She takes the chlorthalidone just as needed when her BP is up.  She is not taking her K anymore.    Lab Results  Component Value Date   TSH 0.686 09/01/2014   She is due for a TSH check   She has seen GI for her reflux- she stopped carbonated drinks, but still has a lot of reflux. She is not taking anything for this right now.  The GERD occurs periodically.   She has not yet had a flu shot today. She started the arava and had methotrexate today.    Patient Active Problem List   Diagnosis Date Noted  . Hx of adenomatous colonic polyps 06/21/2015  . Hyperlipidemia 11/15/2014  . Rheumatoid arthritis ([place]) 09/01/2014  . Pericarditis 04/24/2011  . Aortic valve disorder 02/14/2009  . ELECTROCARDIOGRAM, ABNORMAL 02/14/2009  . PERNICIOUS ANEMIA 07/10/2007  . CANDIDIASIS, ORAL 07/03/2007  . DEPRESSION 07/03/2007  . MALAISE AND FATIGUE 07/03/2007  . HYPOTHYROIDISM 04/30/2007  . HYPERTENSION 04/30/2007  . Aneurysm of thoracic aorta ([place]) 02/13/2006    Past Medical History  Diagnosis Date  . Allergy   . Hyperlipidemia   . Thyroid disease   . Arthritis   . Anxiety   . Depression   . Hypertension   . Aortic insufficiency   . Murmur, heart   . Stroke ([place])     per pt. mild stroke  . Melanoma ([place])   . Cataract   . Macular degeneration   . Diverticulosis   . Adenomatous colon polyp 2012  . Basal cell carcinoma   . Fibromyalgia   . Reflux   . Fibromyalgia     Past Surgical  History  Procedure Laterality Date  . Cholecystectomy    . Colonoscopy      30 + years ago, unsure of where  . Abdominal hysterectomy    . Lasik Bilateral   . Cardiac catheterization    . Nasal septum surgery    . Melanoma excision    . Appendectomy    . Eye surgery    . Tubal ligation    . Basal cell carcinoma excision      Social History  Substance Use Topics  . Smoking status: Former Smoker    Types: Cigarettes    Quit date: 06/21/1979  . Smokeless tobacco: Never Used  . Alcohol Use: No    Family History  Problem Relation Age of Onset  . Heart disease Mother   . Hypertension Mother   . Mental illness Mother   . Heart disease Father   . Hypertension Father   . Mental illness Father   . Hyperlipidemia Sister   . Hypertension Sister   . Hypertension Sister   . Diabetes Sister     No Known Allergies  Medication list has been reviewed and updated.  Current Outpatient Prescriptions on File Prior to Visit  Medication Sig Dispense Refill  . albuterol (PROVENTIL HFA;VENTOLIN HFA)  108 (90 BASE) MCG/ACT inhaler Inhale 2 puffs into the lungs daily as needed for wheezing or shortness of breath.    Marland Kitchen aspirin EC 81 MG tablet Take 1 tablet (81 mg total) by mouth daily.    Marland Kitchen atorvastatin (LIPITOR) 40 MG tablet TAKE 1 TABLET BY MOUTH EVERY DAY 30 tablet 6  . chlorthalidone (HYGROTON) 25 MG tablet Take 1/2 tablet (12.5 mg) daily for blood pressure 45 tablet 3  . Cholecalciferol (VITAMIN D-3) 1000 UNITS CAPS Take 1,000 Units by mouth.    . clonazePAM (KLONOPIN) 0.5 MG tablet Take 1 tablet (0.5 mg total) by mouth 2 (two) times daily as needed. for anxiety 60 tablet 0  . cycloSPORINE (RESTASIS) 0.05 % ophthalmic emulsion 1 drop 2 (two) times daily.    . fluticasone (VERAMYST) 27.5 MCG/SPRAY nasal spray Place 2 sprays into the nose daily. 10 g 2  . folic acid (FOLVITE) 1 MG tablet Take 2 tablets by mouth daily.    . methotrexate (RHEUMATREX) 2.5 MG tablet Take 2.5 mg by mouth once a  week. Caution:Chemotherapy. Protect from light.    . Multiple Vitamins-Minerals (PRESERVISION AREDS PO) Take by mouth.    . sertraline (ZOLOFT) 100 MG tablet TAKE 2 TABLETS BY MOUTH ONCE DAILY  "OFFICE VISIT NEEDED FOR REFILLS" 60 tablet 0  . SYNTHROID 100 MCG tablet TAKE 1 TABLET BY MOUTH DAILY BEFORE BREAKFAST  "NEEDS LABS/OV FOR REFILLS" 15 tablet 0  . valsartan (DIOVAN) 160 MG tablet TAKE 1 TABLET BY MOUTH EVERY DAY 30 tablet 9  . buPROPion (WELLBUTRIN) 100 MG tablet Take 100 mg by mouth daily.      . naproxen sodium (ANAPROX) 220 MG tablet Take 220 mg by mouth as needed.       No current facility-administered medications on file prior to visit.    Review of Systems:  As per HPI- otherwise negative.   Physical Examination: Filed Vitals:   09/30/15 1522  BP: 140/82  Pulse: 93  Temp: 98.3 F (36.8 C)  Resp: 18   Filed Vitals:   09/30/15 1522  Height: 5\' 1"  (1.549 m)  Weight: 154 lb 9.6 oz (70.126 kg)   Body mass index is 29.23 kg/(m^2). Ideal Body Weight: Weight in (lb) to have BMI = 25: 132  GEN: WDWN, NAD, Non-toxic, A & O x 3, overweight, looks well HEENT: Atraumatic, Normocephalic. Neck supple. No masses, No LAD. Ears and Nose: No external deformity. CV: RRR, No M/G/R. No JVD. No thrill. No extra heart sounds. PULM: CTA B, no wheezes, crackles, rhonchi. No retractions. No resp. distress. No accessory muscle use. EXTR: No c/c/e NEURO Normal gait.  PSYCH: Normally interactive. Conversant. Not depressed or anxious appearing.  Calm demeanor.    Assessment and Plan: Hypothyroidism, unspecified hypothyroidism type - Plan: TSH, SYNTHROID 100 MCG tablet  Essential hypertension - Plan: Comprehensive metabolic panel  Screening examination for infectious disease - Plan: Hepatitis C antibody  Here today for follow-up and labs TSH pending Check that her K has not gotten low- she is just taking chlorthalidone as needed so hope it will be ok See patient instructions for  more details.     Signed Lamar Blinks, MD

## 2015-09-30 NOTE — Patient Instructions (Signed)
It was good to see you today- I will be in touch with your labs asap Wait to fill your thyroid med until we make sure it does not need to be adjusted I would suggest that you just use tums as needed for occasional GERD Take care!

## 2015-10-01 LAB — HEPATITIS C ANTIBODY: HCV Ab: NEGATIVE

## 2015-10-03 ENCOUNTER — Telehealth: Payer: Self-pay

## 2015-10-03 DIAGNOSIS — M542 Cervicalgia: Secondary | ICD-10-CM | POA: Diagnosis not present

## 2015-10-03 DIAGNOSIS — M797 Fibromyalgia: Secondary | ICD-10-CM | POA: Diagnosis not present

## 2015-10-03 DIAGNOSIS — M0579 Rheumatoid arthritis with rheumatoid factor of multiple sites without organ or systems involvement: Secondary | ICD-10-CM | POA: Diagnosis not present

## 2015-10-03 DIAGNOSIS — Z79899 Other long term (current) drug therapy: Secondary | ICD-10-CM | POA: Diagnosis not present

## 2015-10-03 MED ORDER — SERTRALINE HCL 100 MG PO TABS: ORAL_TABLET | ORAL | Status: DC

## 2015-10-03 NOTE — Telephone Encounter (Signed)
Rx sent 

## 2015-10-03 NOTE — Telephone Encounter (Signed)
Pt is needing a refill on sertraline 100mg  she is completely out

## 2015-10-04 ENCOUNTER — Other Ambulatory Visit: Payer: Self-pay | Admitting: Family Medicine

## 2015-10-04 DIAGNOSIS — Z23 Encounter for immunization: Secondary | ICD-10-CM | POA: Diagnosis not present

## 2015-10-18 ENCOUNTER — Other Ambulatory Visit: Payer: Self-pay | Admitting: Cardiology

## 2015-10-18 DIAGNOSIS — Z79899 Other long term (current) drug therapy: Secondary | ICD-10-CM | POA: Diagnosis not present

## 2015-10-18 MED ORDER — ATORVASTATIN CALCIUM 40 MG PO TABS: 40.0000 mg | ORAL_TABLET | Freq: Every day | ORAL | Status: DC

## 2015-10-29 ENCOUNTER — Other Ambulatory Visit: Payer: Self-pay | Admitting: Family Medicine

## 2015-10-31 ENCOUNTER — Other Ambulatory Visit: Payer: Self-pay | Admitting: Physician Assistant

## 2015-11-01 ENCOUNTER — Other Ambulatory Visit: Payer: Self-pay | Admitting: Physician Assistant

## 2015-11-11 ENCOUNTER — Ambulatory Visit (INDEPENDENT_AMBULATORY_CARE_PROVIDER_SITE_OTHER): Payer: Medicare Other | Admitting: Family Medicine

## 2015-11-11 VITALS — BP 144/76 | HR 90 | Temp 98.4°F | Resp 16 | Ht 60.0 in | Wt 154.0 lb

## 2015-11-11 DIAGNOSIS — J0121 Acute recurrent ethmoidal sinusitis: Secondary | ICD-10-CM

## 2015-11-11 MED ORDER — DOXYCYCLINE HYCLATE 100 MG PO TABS: 100.0000 mg | ORAL_TABLET | Freq: Two times a day (BID) | ORAL | Status: DC

## 2015-11-11 NOTE — Patient Instructions (Addendum)
We will try a course of doxycycline for possible sinusitis. Please let me know if this helps with your symptoms It may be worthwhile to try a certified home inspector to look for any problems in your home. There are also mold inspections but I would recommend that you use caution to avoid any "schemes" that would take advantage of you

## 2015-11-11 NOTE — Progress Notes (Signed)
[facility] [address] [place] [address] [phone]  Date:  11/11/2015   Name:  Katherine Wang   DOB:  1946-10-24   MRN:  [id number]  PCP:  Lamar Blinks, MD    Chief Complaint: Sinus Problem and chest congestion   History of Present Illness:  Katherine Wang is a 70 y.o. very pleasant female patient who presents with the following:  Here today with illness- described below Lab Results  Component Value Date   TSH 0.938 09/30/2015   She has not run a fever but notes that her normal temp does tend to be about 97  A couple of years ago she had surgery for a deviated septum and then a skin flap for some sort of skin cancer on her nose.    About 8 months ago she felt something in her nose- pulled out something that looked like absorbably suture to her, and she had a nose bleed.   She is not sure if this is related to her current sx or not.   She moved into a new apt over the summer and all was well until the heat was turned on the the fall.  Then she noted a "terrible sewer smell" in her home but no one could ever find anything wrong. Recently an unknown man hole was discovered and her toilet started running back into her bathtub.   Also she was found to have a natural gas leak in her apt.  Her furnace was replaced but it seems now that she is getting some sort of black dust into her apt from the ducts.    Other people do not seem to notice the odor- however she feels like it is worst at night.  She wonders if the odor could be coming from in her- maybe she might have a sinus infection.  She is not sure if the odor is really in the home or "in my head."   She will sometimes wake up at night feeling like she is breathing in bad air "and I have to get out of there."   She has tried spending the night with a friend and this does seem to improve her sx   She did have a CXR about a year ago that was normal   She does not notice any sinus pressure or pain.   She  does have some cough, and some sneezing in the am/  She has noted the cough for the last 3 months or so.    She does use methotrexate for her RA- also arava.  Her RA sx are stable overall but she is worse with the cold.   Her rheum is Dr. Estanislado Pandy.   Patient Active Problem List   Diagnosis Date Noted  . Hx of adenomatous colonic polyps 06/21/2015  . Hyperlipidemia 11/15/2014  . Rheumatoid arthritis ([place]) 09/01/2014  . Pericarditis 04/24/2011  . Aortic valve disorder 02/14/2009  . ELECTROCARDIOGRAM, ABNORMAL 02/14/2009  . PERNICIOUS ANEMIA 07/10/2007  . CANDIDIASIS, ORAL 07/03/2007  . DEPRESSION 07/03/2007  . MALAISE AND FATIGUE 07/03/2007  . HYPOTHYROIDISM 04/30/2007  . HYPERTENSION 04/30/2007  . Aneurysm of thoracic aorta ([place]) 02/13/2006    Past Medical History  Diagnosis Date  . Allergy   . Hyperlipidemia   . Thyroid disease   . Arthritis   . Anxiety   . Depression   . Hypertension   . Aortic insufficiency   . Murmur, heart   . Stroke [facility])  per pt. mild stroke  . Melanoma ([place])   . Cataract   . Macular degeneration   . Diverticulosis   . Adenomatous colon polyp 2012  . Basal cell carcinoma   . Fibromyalgia   . Reflux   . Fibromyalgia     Past Surgical History  Procedure Laterality Date  . Cholecystectomy    . Colonoscopy      30 + years ago, unsure of where  . Abdominal hysterectomy    . Lasik Bilateral   . Cardiac catheterization    . Nasal septum surgery    . Melanoma excision    . Appendectomy    . Eye surgery    . Tubal ligation    . Basal cell carcinoma excision      Social History  Substance Use Topics  . Smoking status: Former Smoker    Types: Cigarettes    Quit date: 06/21/1979  . Smokeless tobacco: Never Used  . Alcohol Use: No    Family History  Problem Relation Age of Onset  . Heart disease Mother   . Hypertension Mother   . Mental illness Mother   . Heart disease Father   . Hypertension Father   . Mental illness Father    . Hyperlipidemia Sister   . Hypertension Sister   . Hypertension Sister   . Diabetes Sister     No Known Allergies  Medication list has been reviewed and updated.  Current Outpatient Prescriptions on File Prior to Visit  Medication Sig Dispense Refill  . albuterol (PROVENTIL HFA;VENTOLIN HFA) 108 (90 BASE) MCG/ACT inhaler Inhale 2 puffs into the lungs daily as needed for wheezing or shortness of breath.    Marland Kitchen aspirin EC 81 MG tablet Take 1 tablet (81 mg total) by mouth daily.    Marland Kitchen atorvastatin (LIPITOR) 40 MG tablet Take 1 tablet (40 mg total) by mouth daily. 30 tablet 0  . chlorthalidone (HYGROTON) 25 MG tablet Take 1/2 tablet (12.5 mg) daily for blood pressure 45 tablet 3  . Cholecalciferol (VITAMIN D-3) 1000 UNITS CAPS Take 1,000 Units by mouth.    . clonazePAM (KLONOPIN) 0.5 MG tablet TAKE 1 TABLET BY MOUTH TWICE DAILY AS NEEDED FOR ANXIETY 60 tablet 1  . cycloSPORINE (RESTASIS) 0.05 % ophthalmic emulsion 1 drop 2 (two) times daily.    . fluticasone (FLONASE) 50 MCG/ACT nasal spray INSTILL 2 SPRAYS IN EACH NOSTRIL EVERY DAY 16 g 1  . fluticasone (VERAMYST) 27.5 MCG/SPRAY nasal spray Place 2 sprays into the nose daily. 10 g 2  . folic acid (FOLVITE) 1 MG tablet Take 2 tablets by mouth daily.    Marland Kitchen leflunomide (ARAVA) 10 MG tablet Take 10 mg by mouth daily.    . methotrexate (RHEUMATREX) 2.5 MG tablet Take 2.5 mg by mouth once a week. Caution:Chemotherapy. Protect from light.    . Multiple Vitamins-Minerals (PRESERVISION AREDS PO) Take by mouth.    . naproxen sodium (ANAPROX) 220 MG tablet Take 220 mg by mouth as needed.      . sertraline (ZOLOFT) 100 MG tablet TAKE 2 TABLETS BY MOUTH DAILY 60 tablet 1  . SYNTHROID 100 MCG tablet TAKE 1 TABLET BY MOUTH DAILY BEFORE BREAKFAST 90 tablet 3  . valsartan (DIOVAN) 160 MG tablet TAKE 1 TABLET BY MOUTH EVERY DAY 30 tablet 9   No current facility-administered medications on file prior to visit.    Review of Systems:  As per HPI- otherwise  negative.   Physical Examination: Filed Vitals:  11/11/15 1550  BP: 144/76  Pulse: 120  Temp: 98.4 F (36.9 C)  Resp: 16   Filed Vitals:   11/11/15 1550  Height: 5' (1.524 m)  Weight: 154 lb (69.854 kg)   Body mass index is 30.08 kg/(m^2). Ideal Body Weight: Weight in (lb) to have BMI = 25: 127.7  GEN: WDWN, NAD, Non-toxic, A & O x 3, looks well, overweight HEENT: Atraumatic, Normocephalic. Neck supple. No masses, No LAD.  Bilateral TM wnl, oropharynx normal.  PEERL,EOMI.   Nasal cavity is inflamed but no purulence noted  Ears and Nose: No external deformity. CV: RRR, No M/G/R. No JVD. No thrill. No extra heart sounds. PULM: CTA B, no wheezes, crackles, rhonchi. No retractions. No resp. distress. No accessory muscle use. ABD: S, NT, ND, +BS. No rebound. No HSM. EXTR: No c/c/e NEURO Normal gait.  PSYCH: Normally interactive. Conversant. Not depressed or anxious appearing.  Calm demeanor.   She is on 20 mg of methotrexate once a week  Assessment and Plan: Acute recurrent ethmoidal sinusitis - Plan: doxycycline (VIBRA-TABS) 100 MG tablet  She is here today with concern that the foul smell she notes in her home may be coming from her sinuses.  We can certainly try a course of doxycycline as she has been suffering with this for 3 months.  However also suggested that she have a professional home inspector take a look at her home to ensure that all is well.  She will keep me posted  Signed Lamar Blinks, MD

## 2015-11-14 ENCOUNTER — Other Ambulatory Visit: Payer: Self-pay | Admitting: Cardiology

## 2015-11-18 ENCOUNTER — Encounter: Payer: Self-pay | Admitting: Family Medicine

## 2015-11-23 ENCOUNTER — Encounter: Payer: Self-pay | Admitting: Family Medicine

## 2015-12-11 ENCOUNTER — Other Ambulatory Visit: Payer: Self-pay | Admitting: Cardiology

## 2015-12-14 DIAGNOSIS — Z79899 Other long term (current) drug therapy: Secondary | ICD-10-CM | POA: Diagnosis not present

## 2015-12-29 ENCOUNTER — Other Ambulatory Visit: Payer: Self-pay | Admitting: Physician Assistant

## 2015-12-30 ENCOUNTER — Other Ambulatory Visit: Payer: Self-pay | Admitting: Family Medicine

## 2015-12-30 ENCOUNTER — Other Ambulatory Visit: Payer: Self-pay | Admitting: Cardiology

## 2016-01-03 ENCOUNTER — Other Ambulatory Visit: Payer: Self-pay | Admitting: Cardiology

## 2016-01-27 ENCOUNTER — Other Ambulatory Visit: Payer: Self-pay | Admitting: Family Medicine

## 2016-01-30 ENCOUNTER — Other Ambulatory Visit: Payer: Self-pay

## 2016-01-30 ENCOUNTER — Other Ambulatory Visit: Payer: Self-pay | Admitting: Family Medicine

## 2016-01-30 MED ORDER — CLONAZEPAM 0.5 MG PO TABS: 0.5000 mg | ORAL_TABLET | Freq: Two times a day (BID) | ORAL | Status: DC | PRN

## 2016-01-30 NOTE — Telephone Encounter (Signed)
Caller name:Self  Can be reached: [phone]  Pharmacy:  [id number] - [place], [place] [phone] (Phone) [phone] (Fax)         Reason for call: Request refills on clonazePAM (KLONOPIN) 0.5 MG tablet [id number]  And sertraline (ZOLOFT) 100 MG tablet [id number]   Patient states she is out of medication

## 2016-01-30 NOTE — Progress Notes (Signed)
Requests klonpin RF>  Last fill in February, ok to fill

## 2016-01-31 MED ORDER — SERTRALINE HCL 100 MG PO TABS: 200.0000 mg | ORAL_TABLET | Freq: Every day | ORAL | Status: DC

## 2016-02-02 DIAGNOSIS — Z09 Encounter for follow-up examination after completed treatment for conditions other than malignant neoplasm: Secondary | ICD-10-CM | POA: Diagnosis not present

## 2016-02-02 DIAGNOSIS — M19241 Secondary osteoarthritis, right hand: Secondary | ICD-10-CM | POA: Diagnosis not present

## 2016-02-02 DIAGNOSIS — M0579 Rheumatoid arthritis with rheumatoid factor of multiple sites without organ or systems involvement: Secondary | ICD-10-CM | POA: Diagnosis not present

## 2016-02-02 DIAGNOSIS — M79641 Pain in right hand: Secondary | ICD-10-CM | POA: Diagnosis not present

## 2016-02-15 ENCOUNTER — Encounter: Payer: Self-pay | Admitting: Family Medicine

## 2016-02-15 ENCOUNTER — Ambulatory Visit (INDEPENDENT_AMBULATORY_CARE_PROVIDER_SITE_OTHER): Payer: Medicare Other | Admitting: Family Medicine

## 2016-02-15 VITALS — BP 130/80 | HR 85 | Temp 98.1°F | Ht 61.0 in | Wt 150.4 lb

## 2016-02-15 DIAGNOSIS — R634 Abnormal weight loss: Secondary | ICD-10-CM

## 2016-02-15 DIAGNOSIS — Z79899 Other long term (current) drug therapy: Secondary | ICD-10-CM | POA: Diagnosis not present

## 2016-02-15 DIAGNOSIS — M25571 Pain in right ankle and joints of right foot: Secondary | ICD-10-CM | POA: Diagnosis not present

## 2016-02-15 DIAGNOSIS — R413 Other amnesia: Secondary | ICD-10-CM | POA: Diagnosis not present

## 2016-02-15 NOTE — Progress Notes (Signed)
[place] [facility] [address], [place],  [address] [phone]  Date:  02/15/2016   Name:  Katherine Wang   DOB:  1946/01/31   MRN:  [id number]  PCP:  Lamar Blinks, MD    Chief Complaint: Follow-up   History of Present Illness:  Katherine Wang is a 70 y.o. very pleasant female patient who presents with the following:  History of RA, pernicious anemia,hypothyroidism, HTN. Last seen by myself in January with sinusiits.   Lab Results  Component Value Date   TSH 0.938 09/30/2015   She has noted some weight loss of about 5 lbs- she does have IBS.  She has noted less constipation and more loose stools as of late. She will see her GI Dr. Fuller Plan soon to update colonoscopy- it has been about 5 years now since her last screening She uses weekly methothrexate injections for her RA- she wonders if there is any where she can get this done for less than the cost of a copay  Wt Readings from Last 3 Encounters:  02/15/16 150 lb 6.4 oz (68.221 kg)  11/11/15 154 lb (69.854 kg)  09/30/15 154 lb 9.6 oz (70.126 kg)   November 2015- 155 lbs.   She will use chlorthalidone just as needed for rinigng in her ears- however she generally uses just valsartan for her HTN BP Readings from Last 3 Encounters:  02/15/16 130/80  11/11/15 144/76  09/30/15 140/82   She has noted some difficulty recalling the names of objects recently. Sometimes the wrong word will come out of her mouth when she intended to say something else.  She is getting worried about dementia and would like to be seen for this.  She has never gotten lost in a familiar setting She has a masters degree in psychology  Also she notes some difficulty with her right ankle- it does not flex well, ?due to bone spurs.  This is starting to make it hard for her to walk. Would like eval  Patient Active Problem List   Diagnosis Date Noted  . Hx of adenomatous colonic polyps 06/21/2015   . Hyperlipidemia 11/15/2014  . Rheumatoid arthritis ([place]) 09/01/2014  . Pericarditis 04/24/2011  . Aortic valve disorder 02/14/2009  . ELECTROCARDIOGRAM, ABNORMAL 02/14/2009  . PERNICIOUS ANEMIA 07/10/2007  . CANDIDIASIS, ORAL 07/03/2007  . DEPRESSION 07/03/2007  . MALAISE AND FATIGUE 07/03/2007  . HYPOTHYROIDISM 04/30/2007  . HYPERTENSION 04/30/2007  . Aneurysm of thoracic aorta ([place]) 02/13/2006    Past Medical History  Diagnosis Date  . Allergy   . Hyperlipidemia   . Thyroid disease   . Arthritis   . Anxiety   . Depression   . Hypertension   . Aortic insufficiency   . Murmur, heart   . Stroke ([place])     per pt. mild stroke  . Melanoma ([place])   . Cataract   . Macular degeneration   . Diverticulosis   . Adenomatous colon polyp 2012  . Basal cell carcinoma   . Fibromyalgia   . Reflux   . Fibromyalgia     Past Surgical History  Procedure Laterality Date  . Cholecystectomy    . Colonoscopy      30 + years ago, unsure of where  . Abdominal hysterectomy    . Lasik Bilateral   . Cardiac catheterization    . Nasal septum surgery    . Melanoma excision    . Appendectomy    .  Eye surgery    . Tubal ligation    . Basal cell carcinoma excision      Social History  Substance Use Topics  . Smoking status: Former Smoker    Types: Cigarettes    Quit date: 06/21/1979  . Smokeless tobacco: Never Used  . Alcohol Use: No    Family History  Problem Relation Age of Onset  . Heart disease Mother   . Hypertension Mother   . Mental illness Mother   . Heart disease Father   . Hypertension Father   . Mental illness Father   . Hyperlipidemia Sister   . Hypertension Sister   . Hypertension Sister   . Diabetes Sister     No Known Allergies  Medication list has been reviewed and updated.  Current Outpatient Prescriptions on File Prior to Visit  Medication Sig Dispense Refill  . albuterol (PROVENTIL HFA;VENTOLIN HFA) 108 (90 BASE) MCG/ACT inhaler Inhale 2 puffs  into the lungs daily as needed for wheezing or shortness of breath.    Marland Kitchen atorvastatin (LIPITOR) 40 MG tablet TAKE 1 TABLET BY MOUTH EVERY DAY 30 tablet 4  . clonazePAM (KLONOPIN) 0.5 MG tablet Take 1 tablet (0.5 mg total) by mouth 2 (two) times daily as needed. 60 tablet 0  . cycloSPORINE (RESTASIS) 0.05 % ophthalmic emulsion 1 drop 2 (two) times daily.    . fluticasone (FLONASE) 50 MCG/ACT nasal spray INSTILL 2 SPRAYS IN EACH NOSTRIL EVERY DAY 16 g 1  . folic acid (FOLVITE) 1 MG tablet Take 2 tablets by mouth daily.    Marland Kitchen leflunomide (ARAVA) 10 MG tablet Take 10 mg by mouth daily.    . methotrexate (RHEUMATREX) 2.5 MG tablet Take 2.5 mg by mouth once a week. Caution:Chemotherapy. Protect from light.    . naproxen sodium (ANAPROX) 220 MG tablet Take 220 mg by mouth as needed.      . sertraline (ZOLOFT) 100 MG tablet Take 2 tablets (200 mg total) by mouth daily. 180 tablet 2  . SYNTHROID 100 MCG tablet TAKE 1 TABLET BY MOUTH DAILY BEFORE BREAKFAST 90 tablet 3  . valsartan (DIOVAN) 160 MG tablet TAKE 1 TABLET BY MOUTH EVERY DAY 30 tablet 9  . aspirin EC 81 MG tablet Take 81 mg by mouth daily. Reported on 02/15/2016    . chlorthalidone (HYGROTON) 25 MG tablet Take 1/2 tablet (12.5 mg) daily for blood pressure (Patient not taking: Reported on 02/15/2016) 45 tablet 3  . Cholecalciferol (VITAMIN D-3) 1000 UNITS CAPS Take 1,000 Units by mouth. Reported on 02/15/2016    . Multiple Vitamins-Minerals (PRESERVISION AREDS PO) Take by mouth. Reported on 02/15/2016     No current facility-administered medications on file prior to visit.    Review of Systems:  As per HPI- otherwise negative.   Physical Examination: Filed Vitals:   02/15/16 1014  BP: 130/80  Pulse: 85  Temp: 98.1 F (36.7 C)   Filed Vitals:   02/15/16 1014  Height: 5\' 1"  (1.549 m)  Weight: 150 lb 6.4 oz (68.221 kg)   Body mass index is 28.43 kg/(m^2). Ideal Body Weight: Weight in (lb) to have BMI = 25: 132  GEN: WDWN, NAD,  Non-toxic, A & O x 3, looks well, mild overweight.   HEENT: Atraumatic, Normocephalic. Neck supple. No masses, No LAD. Ears and Nose: No external deformity. CV: RRR, No M/G/R. No JVD. No thrill. No extra heart sounds. PULM: CTA B, no wheezes, crackles, rhonchi. No retractions. No resp. distress. No accessory muscle use.  ABD: S, NT, ND EXTR: No c/c/e NEURO Normal gait.  PSYCH: Normally interactive. Conversant. Not depressed or anxious appearing.  Calm demeanor.  Right ankle flexes to about 90 degrees, no further.  Also a bunion right great toe   Assessment and Plan: Memory change - Plan: Ambulatory referral to Neurology  Right ankle pain - Plan: Ambulatory referral to Orthopedic Surgery  Loss of weight  Concern about her memory- will set up with neurology for a full evaluation.  She would like to do anything that she can to ward off dementia Right ankle pain- referral to foot and ankle surgery  Lab Results  Component Value Date   TSH 0.938 09/30/2015   Recent TSH normal. Her weight loss is slight, but as she has noted a stool change will have her see her GI doctor soon- she agrees to do so Plan to recheck here in 6 months  Signed Lamar Blinks, MD

## 2016-02-15 NOTE — Progress Notes (Signed)
Pre visit review using our clinic tool,if applicable. No additional management support is needed unless otherwise documented below in the visit note.  

## 2016-02-15 NOTE — Patient Instructions (Addendum)
I will refer you to a neurologist to evaluate your memory concerns. I will also send you to see Dr. Doran Durand about your right ankle concern.  Please do see your GI doctor (Dr. Fuller Plan) relatively soon to update your colonoscopy  Address: [address], [place], [place] [address]  Phone: [phone]   Our pharmacist here does not do methotrexate injections- you might also try [facility] or do self- teaching

## 2016-02-25 ENCOUNTER — Other Ambulatory Visit: Payer: Self-pay | Admitting: Family Medicine

## 2016-02-28 ENCOUNTER — Ambulatory Visit: Payer: Medicare Other | Admitting: Neurology

## 2016-03-04 ENCOUNTER — Other Ambulatory Visit: Payer: Self-pay | Admitting: Family Medicine

## 2016-03-19 ENCOUNTER — Telehealth: Payer: Self-pay | Admitting: Neurology

## 2016-03-19 ENCOUNTER — Encounter: Payer: Self-pay | Admitting: Neurology

## 2016-03-19 ENCOUNTER — Ambulatory Visit (INDEPENDENT_AMBULATORY_CARE_PROVIDER_SITE_OTHER): Payer: Medicare Other | Admitting: Neurology

## 2016-03-19 ENCOUNTER — Other Ambulatory Visit (INDEPENDENT_AMBULATORY_CARE_PROVIDER_SITE_OTHER): Payer: Medicare Other

## 2016-03-19 VITALS — BP 138/90 | HR 98 | Ht 61.0 in | Wt 149.4 lb

## 2016-03-19 DIAGNOSIS — R413 Other amnesia: Secondary | ICD-10-CM | POA: Diagnosis not present

## 2016-03-19 DIAGNOSIS — G609 Hereditary and idiopathic neuropathy, unspecified: Secondary | ICD-10-CM

## 2016-03-19 DIAGNOSIS — R7309 Other abnormal glucose: Secondary | ICD-10-CM | POA: Diagnosis not present

## 2016-03-19 DIAGNOSIS — G518 Other disorders of facial nerve: Secondary | ICD-10-CM | POA: Diagnosis not present

## 2016-03-19 DIAGNOSIS — G5139 Clonic hemifacial spasm, unspecified: Secondary | ICD-10-CM

## 2016-03-19 DIAGNOSIS — R739 Hyperglycemia, unspecified: Secondary | ICD-10-CM

## 2016-03-19 LAB — COMPREHENSIVE METABOLIC PANEL
ALK PHOS: 103 U/L (ref 39–117)
ALT: 29 U/L (ref 0–35)
AST: 24 U/L (ref 0–37)
Albumin: 4.2 g/dL (ref 3.5–5.2)
BILIRUBIN TOTAL: 0.7 mg/dL (ref 0.2–1.2)
BUN: 10 mg/dL (ref 6–23)
CO2: 25 mEq/L (ref 19–32)
Calcium: 9.5 mg/dL (ref 8.4–10.5)
Chloride: 105 mEq/L (ref 96–112)
Creatinine, Ser: 0.68 mg/dL (ref 0.40–1.20)
GFR: 90.38 mL/min (ref >60.00)
GLUCOSE: 95 mg/dL (ref 70–99)
POTASSIUM: 3.6 meq/L (ref 3.5–5.1)
SODIUM: 137 meq/L (ref 135–145)
TOTAL PROTEIN: 6.9 g/dL (ref 6.0–8.3)

## 2016-03-19 LAB — TSH: TSH: 0.7 u[IU]/mL (ref 0.35–4.50)

## 2016-03-19 LAB — HEMOGLOBIN A1C: Hgb A1c MFr Bld: 5.8 % (ref 4.6–6.5)

## 2016-03-19 LAB — VITAMIN B12: Vitamin B-12: 338 pg/mL (ref 211–911)

## 2016-03-19 LAB — FOLATE: FOLATE: 17.1 ng/mL (ref >5.9)
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
## 2016-03-19 NOTE — Telephone Encounter (Signed)
-----   Message from [place], DO sent at 03/19/2016  3:42 PM EDT ----- You can let pt know that B12 is normal but for neurologic purposes I would like to see it over 400 and hers is just a bit under.  Have her take oral supplement

## 2016-03-19 NOTE — Telephone Encounter (Signed)
Patient made aware. She will start B12 1000 mcg daily.

## 2016-03-20 LAB — RPR

## 2016-03-21 LAB — PROTEIN ELECTROPHORESIS,RANDOM URN
CREATININE, URINE: 73 mg/dL (ref 20–320)
Protein Creatinine Ratio: 123 mg/g creat (ref 21–161)
Total Protein, Urine: 9 mg/dL (ref 5–24)

## 2016-03-21 LAB — IMMUNOFIXATION ELECTROPHORESIS
IGA: 113 mg/dL (ref 81–463)
IGG (IMMUNOGLOBIN G), SERUM: 921 mg/dL (ref 694–1618)
IGM, SERUM: 209 mg/dL (ref 48–271)

## 2016-03-21 LAB — PROTEIN ELECTROPHORESIS, SERUM
ALBUMIN ELP: 3.8 g/dL (ref 3.8–4.8)
ALPHA-1-GLOBULIN: 0.6 g/dL — AB (ref 0.2–0.3)
Alpha-2-Globulin: 0.6 g/dL (ref 0.5–0.9)
Beta 2: 0.3 g/dL (ref 0.2–0.5)
Beta Globulin: 0.4 g/dL (ref 0.4–0.6)
Gamma Globulin: 0.8 g/dL (ref 0.8–1.7)
TOTAL PROTEIN, SERUM ELECTROPHOR: 6.5 g/dL (ref 6.1–8.1)

## 2016-03-26 ENCOUNTER — Other Ambulatory Visit: Payer: Self-pay | Admitting: Family Medicine

## 2016-03-28 ENCOUNTER — Ambulatory Visit
Admission: RE | Admit: 2016-03-28 | Discharge: 2016-03-28 | Disposition: A | Discharge status: Home / Self Care | Payer: Medicare Other | Source: Ambulatory Visit | Attending: Neurology | Admitting: Neurology

## 2016-03-28 DIAGNOSIS — R413 Other amnesia: Secondary | ICD-10-CM

## 2016-03-28 DIAGNOSIS — M21611 Bunion of right foot: Secondary | ICD-10-CM | POA: Diagnosis not present

## 2016-03-28 DIAGNOSIS — M79671 Pain in right foot: Secondary | ICD-10-CM | POA: Diagnosis not present

## 2016-03-28 DIAGNOSIS — R42 Dizziness and giddiness: Secondary | ICD-10-CM | POA: Diagnosis not present

## 2016-03-28 DIAGNOSIS — R739 Hyperglycemia, unspecified: Secondary | ICD-10-CM

## 2016-03-28 DIAGNOSIS — M19071 Primary osteoarthritis, right ankle and foot: Secondary | ICD-10-CM | POA: Diagnosis not present

## 2016-03-28 MED ORDER — GADOBENATE DIMEGLUMINE 529 MG/ML IV SOLN
14.0000 mL | Freq: Once | INTRAVENOUS | Status: AC | PRN
  Administered 2016-03-28: 14 mL via INTRAVENOUS

## 2016-03-29 ENCOUNTER — Other Ambulatory Visit: Payer: Self-pay | Admitting: Family Medicine

## 2016-03-29 ENCOUNTER — Telehealth: Payer: Self-pay | Admitting: Neurology

## 2016-03-29 NOTE — Telephone Encounter (Signed)
-----   Message from [place], DO sent at 03/29/2016  7:50 AM EDT ----- Reviewed with Dr. Nevada Crane.  Very, very small possible old lacunes in caudate heads bilaterally.  Not convinced of cerebellar lacune but small linear T2 noted in L cerebellum that could be WMD.  Katherine Wang, you can let pt that MRI looks pretty good.  Very small, tiny old stroke (hardening of artery) but nothing to worry about.

## 2016-03-29 NOTE — Telephone Encounter (Signed)
Patient made aware.

## 2016-04-09 ENCOUNTER — Encounter: Payer: Self-pay | Admitting: Family Medicine

## 2016-04-09 ENCOUNTER — Ambulatory Visit (HOSPITAL_BASED_OUTPATIENT_CLINIC_OR_DEPARTMENT_OTHER)
Admission: RE | Admit: 2016-04-09 | Discharge: 2016-04-09 | Disposition: A | Discharge status: Home / Self Care | Payer: Medicare Other | Source: Ambulatory Visit | Attending: Family Medicine | Admitting: Family Medicine

## 2016-04-09 ENCOUNTER — Ambulatory Visit (INDEPENDENT_AMBULATORY_CARE_PROVIDER_SITE_OTHER): Payer: Medicare Other | Admitting: Family Medicine

## 2016-04-09 VITALS — BP 130/80 | HR 90 | Temp 98.9°F | Ht 60.0 in | Wt 149.4 lb

## 2016-04-09 DIAGNOSIS — R059 Cough, unspecified: Secondary | ICD-10-CM

## 2016-04-09 DIAGNOSIS — R0989 Other specified symptoms and signs involving the circulatory and respiratory systems: Secondary | ICD-10-CM

## 2016-04-09 DIAGNOSIS — J029 Acute pharyngitis, unspecified: Secondary | ICD-10-CM

## 2016-04-09 DIAGNOSIS — R05 Cough: Secondary | ICD-10-CM

## 2016-04-09 MED ORDER — DOXYCYCLINE HYCLATE 100 MG PO CAPS: 100.0000 mg | ORAL_CAPSULE | Freq: Two times a day (BID) | ORAL | Status: DC

## 2016-04-09 MED ORDER — ALBUTEROL SULFATE HFA 108 (90 BASE) MCG/ACT IN AERS: 2.0000 | INHALATION_SPRAY | Freq: Four times a day (QID) | RESPIRATORY_TRACT | Status: DC | PRN

## 2016-04-09 MED ORDER — PREDNISONE 20 MG PO TABS: ORAL_TABLET | ORAL | Status: DC

## 2016-04-09 NOTE — Progress Notes (Signed)
Pre visit review using our clinic review tool, if applicable. No additional management support is needed unless otherwise documented below in the visit note. 

## 2016-04-09 NOTE — Progress Notes (Signed)
[facility] [address] [place], [place] [address] [phone]  Date:  04/09/2016   Name:  Katherine Wang   DOB:  1946-04-05   MRN:  [id number]  PCP:  Lamar Blinks, MD    Chief Complaint: Cough   History of Present Illness:  ELECTA VOIROL is a 70 y.o. very pleasant female patient who presents with the following:  History of HTN, RA, hypothyroidism. Here today with complaint of not feeling well. She noted a bad cough over the last 2 days, ST, and cold chills last night.  She feels pretty certain that she had a fever yesterday this am her temp was about 100.3 She is coughing up some yellow material and phlegm  She feels tight in her chest  She has also noted a ST.   She tried a sore throat spray and cough drops She has noted some nausea but no vomiting  No antipyretics used Lab Results  Component Value Date   TSH 0.70 03/19/2016     Patient Active Problem List   Diagnosis Date Noted  . Hx of adenomatous colonic polyps 06/21/2015  . Hyperlipidemia 11/15/2014  . Rheumatoid arthritis ([place]) 09/01/2014  . Pericarditis 04/24/2011  . Aortic valve disorder 02/14/2009  . ELECTROCARDIOGRAM, ABNORMAL 02/14/2009  . PERNICIOUS ANEMIA 07/10/2007  . CANDIDIASIS, ORAL 07/03/2007  . DEPRESSION 07/03/2007  . MALAISE AND FATIGUE 07/03/2007  . HYPOTHYROIDISM 04/30/2007  . HYPERTENSION 04/30/2007  . Aneurysm of thoracic aorta ([place]) 02/13/2006    Past Medical History  Diagnosis Date  . Allergy   . Hyperlipidemia   . Thyroid disease   . Arthritis   . Anxiety   . Depression   . Hypertension   . Aortic insufficiency   . Murmur, heart   . Stroke ([place])     per pt. mild stroke  . Melanoma ([place])   . Cataract   . Macular degeneration   . Diverticulosis   . Adenomatous colon polyp 2012  . Basal cell carcinoma   . Fibromyalgia   . Reflux   . Fibromyalgia     Past Surgical History  Procedure Laterality Date  .  Cholecystectomy    . Colonoscopy      30 + years ago, unsure of where  . Abdominal hysterectomy    . Lasik Bilateral   . Cardiac catheterization    . Nasal septum surgery    . Melanoma excision    . Appendectomy    . Eye surgery    . Tubal ligation    . Basal cell carcinoma excision      Social History  Substance Use Topics  . Smoking status: Former Smoker    Types: Cigarettes    Quit date: 06/21/1979  . Smokeless tobacco: Never Used  . Alcohol Use: No    Family History  Problem Relation Age of Onset  . Heart disease Mother   . Hypertension Mother   . Mental illness Mother   . Heart disease Father   . Hypertension Father   . Mental illness Father   . Hyperlipidemia Sister   . Hypertension Sister   . Hypertension Sister   . Diabetes Sister     No Known Allergies  Medication list has been reviewed and updated.  Current Outpatient Prescriptions on File Prior to Visit  Medication Sig Dispense Refill  . albuterol (PROVENTIL HFA;VENTOLIN HFA) 108 (90 BASE) MCG/ACT inhaler Inhale 2 puffs into the lungs daily  as needed for wheezing or shortness of breath.    Marland Kitchen atorvastatin (LIPITOR) 40 MG tablet TAKE 1 TABLET BY MOUTH EVERY DAY 30 tablet 4  . chlorthalidone (HYGROTON) 25 MG tablet Take 1/2 tablet (12.5 mg) daily for blood pressure 45 tablet 3  . Cholecalciferol (VITAMIN D-3) 1000 UNITS CAPS Take 1,000 Units by mouth. Reported on 02/15/2016    . clonazePAM (KLONOPIN) 0.5 MG tablet TAKE 1 TABLET BY MOUTH TWICE DAILY AS NEEDED 60 tablet 0  . cycloSPORINE (RESTASIS) 0.05 % ophthalmic emulsion 1 drop 2 (two) times daily.    . fluticasone (FLONASE) 50 MCG/ACT nasal spray INSTILL 2 SPRAYS IN EACH NOSTRIL EVERY DAY 16 g 1  . folic acid (FOLVITE) 1 MG tablet Take 2 tablets by mouth daily.    Marland Kitchen leflunomide (ARAVA) 10 MG tablet Take 10 mg by mouth daily.    . methotrexate (RHEUMATREX) 2.5 MG tablet Take 2.5 mg by mouth once a week. Caution:Chemotherapy. Protect from light.    .  Multiple Vitamins-Minerals (PRESERVISION AREDS PO) Take by mouth. Reported on 02/15/2016    . naproxen sodium (ANAPROX) 220 MG tablet Take 220 mg by mouth as needed.      . sertraline (ZOLOFT) 100 MG tablet Take 2 tablets (200 mg total) by mouth daily. 180 tablet 2  . SYNTHROID 100 MCG tablet TAKE 1 TABLET BY MOUTH DAILY BEFORE BREAKFAST 90 tablet 3  . valsartan (DIOVAN) 160 MG tablet TAKE 1 TABLET BY MOUTH DAILY 30 tablet 0   No current facility-administered medications on file prior to visit.    Review of Systems:  As per HPI- otherwise negative.   Physical Examination: Filed Vitals:   04/09/16 1308  BP: 153/91  Pulse: 113  Temp: 98.9 F (37.2 C)   Filed Vitals:   04/09/16 1308  Height: 5' (1.524 m)  Weight: 149 lb 6.4 oz (67.767 kg)   Body mass index is 29.18 kg/(m^2). Ideal Body Weight: Weight in (lb) to have BMI = 25: 127.7  GEN: WDWN, NAD, Non-toxic, A & O x 3,overweight, looks well HEENT: Atraumatic, Normocephalic. Neck supple. No masses, No LAD. Bilateral TM wnl, oropharynx normal.  PEERL,EOMI.   Ears and Nose: No external deformity. CV: RRR, No M/G/R. No JVD. No thrill. No extra heart sounds. PULM: mild ronchi bilaterally. No retractions. No resp. distress. No accessory muscle use. ABD: S, NT, ND, +BS. No rebound. No HSM. EXTR: No c/c/e NEURO Normal gait.  PSYCH: Normally interactive. Conversant. Not depressed or anxious appearing.  Calm demeanor.   Dg Chest 2 View  04/09/2016  CLINICAL DATA:  Cough and congestion for 3 days. EXAM: CHEST  2 VIEW COMPARISON:  12/11/2014. FINDINGS: Trachea is midline. Heart size normal. Thoracic aorta is calcified. Right apical pleural thickening. Lungs are clear. No pleural fluid. S-shaped scoliosis. IMPRESSION: No acute findings. Electronically Signed   By: Lorin Picket M.D.   On: 04/09/2016 14:06   Mr Jeri Cos [id number] Contrast  03/28/2016  CLINICAL DATA:  Difficulty with word-finding. Balance issues. Memory loss. Dizziness. EXAM: MRI  HEAD WITHOUT AND WITH CONTRAST TECHNIQUE: Multiplanar, multiecho pulse sequences of the brain and surrounding structures were obtained without and with intravenous contrast. CONTRAST:  45mL MULTIHANCE GADOBENATE DIMEGLUMINE 529 MG/ML IV SOLN COMPARISON:  None. FINDINGS: The diffusion-weighted images demonstrate no evidence for acute or subacute infarction. Remote lacunar infarcts are present within the central pons and cerebellum. Remote lacunar infarcts are present within the caudate head bilaterally. Minimal periventricular T2 changes bilaterally are otherwise normal  for age. The internal auditory canals are within normal limits bilaterally. Flow is present in the major intracranial arteries. The globes and orbits are intact. The paranasal sinuses are clear. Mastoid air cells are clear. Postcontrast images demonstrate no pathologic enhancement. IMPRESSION: 1. No acute intracranial abnormality. 2. Remote lacunar infarcts the brainstem, cerebellum bilaterally, and bilateral caudate head. Electronically Signed   By: San Morelle M.D.   On: 03/28/2016 16:11   Assessment and Plan: Chest congestion - Plan: DG Chest 2 View, predniSONE (DELTASONE) 20 MG tablet, albuterol (PROVENTIL HFA;VENTOLIN HFA) 108 (90 Base) MCG/ACT inhaler  Acute pharyngitis, unspecified etiology - Plan: POCT rapid strep A  Cough - Plan: DG Chest 2 View, doxycycline (VIBRAMYCIN) 100 MG capsule, predniSONE (DELTASONE) 20 MG tablet  Here today with cough and malaise.  Sent for CXR as above Will treat for bronchitis with doxycycline, also a low does of prednisone for bronchospasm.  Discussed possible increased risk of infection as she is also using methotrexate and arava- she understands and will watch carefully for any worsening Refilled her albuterol inhaler  Meds ordered this encounter  Medications  . Cyanocobalamin (VITAMIN B 12 PO)    Sig: Take 1 tablet by mouth daily.  Marland Kitchen doxycycline (VIBRAMYCIN) 100 MG capsule    Sig:  Take 1 capsule (100 mg total) by mouth 2 (two) times daily.    Dispense:  20 capsule    Refill:  0  . predniSONE (DELTASONE) 20 MG tablet    Sig: Take 1 pill daily for 3 days    Dispense:  3 tablet    Refill:  0  . albuterol (PROVENTIL HFA;VENTOLIN HFA) 108 (90 Base) MCG/ACT inhaler    Sig: Inhale 2 puffs into the lungs every 6 (six) hours as needed for wheezing or shortness of breath.    Dispense:  18 g    Refill:  2     Signed Lamar Blinks, MD

## 2016-04-09 NOTE — Patient Instructions (Signed)
Use the doxycycline twice a day for 10 days- take with food and water Use the prednisone as directed, and the albuterol inhaler as needed Let me know if you are not feeling better in the next couple of days

## 2016-04-12 ENCOUNTER — Encounter: Payer: Self-pay | Admitting: Psychology

## 2016-04-12 ENCOUNTER — Ambulatory Visit (INDEPENDENT_AMBULATORY_CARE_PROVIDER_SITE_OTHER): Payer: Medicare Other | Admitting: Psychology

## 2016-04-12 DIAGNOSIS — F329 Major depressive disorder, single episode, unspecified: Secondary | ICD-10-CM | POA: Diagnosis not present

## 2016-04-12 DIAGNOSIS — F32A Depression, unspecified: Secondary | ICD-10-CM

## 2016-04-12 DIAGNOSIS — R413 Other amnesia: Secondary | ICD-10-CM | POA: Diagnosis not present

## 2016-04-12 NOTE — Progress Notes (Signed)
NEUROPSYCHOLOGICAL INTERVIEW (CPT: [id number])  Name: Katherine Wang Date of Birth: 06-17-1946 Date of Interview: 04/12/2016  Reason for Referral:  Katherine Wang is a 70 y.o. female who is referred for neuropsychological evaluation by Dr. Wells Guiles Tat of [facility] due to concerns about cognitive changes. This patient is unaccompanied to the appointment today.  History of Presenting Problem:  Katherine Wang reported recent onset (six months ago or less) of cognitive changes including reduced working memory and increased word finding difficulty. She reported word substitution errors (e.g., "white" instead of "vanilla"). She denied any other cognitive symptoms, including changes in episodic memory, visual-spatial skills or attention. She does believe her language symptoms may have improved somewhat since her last visit with Dr. Carles Collet on 03/19/2016. She reported that she was relieved to hear that her recent brain MRI was essentially normal.   Katherine Wang reported that she suffered a mild stroke 20-30 years ago. She reported that while driving, her left arm went numb but "slowly the feeling crept back and". She saw a neurologist who told her that it had been a mild stroke with minimal residual effects. She is unsure if she had neuro imaging at that time.  The patient also has a history of depression for the past 50 years as well as generalized anxiety. She previously saw a therapist in [place], [place] but she has not been in therapy for at least 6 years. She has been taking Zoloft for many years. She also takes Klonopin at night before bed. She denied suicidal ideation or intention. Family history is reportedly significant for depression in multiple family members including her mother who was psychiatrically hospitalized several times for psychotic episodes.  Katherine Wang reported significant psychosocial stress at the present time due to tension relationships with her daughters and son-in-law. She  also reported that she worries about her grandchildren, including one grandson who has autism. The patient reported that she misses working very much. She retired a few years ago but is not sure that she should have. The patient lives alone and does not socialize very often. She stated, "I am not a joiner." She reported having one good friend.  The patient lives alone in an apartment which she rents from her daughter and son-in-law, a source of conflict in her life. She continues to manage all instrumental ADLs including driving, medications, finances, cooking, and appointments, without any reported difficulty.  Physically, the patient complained of low energy and fatigue. She has had sleep difficulties in the past but reported her sleep has improved recently. The patient drinks minimal alcohol (2 times a year). She is a former heavy smoker (ages [age]). She denied history of substance abuse or dependence.  Social History: Born/Raised: [employer]. The patient reported that she was largely raised by her great aunt. Education/occupation: The patient obtained a GED and later went to college as an adult. She finished a Masters degree in psychology in her early 60s. 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 Marital history: Divorced. The patient had 3 children. She lost her son when he was 35 years old due to an aneurysm. She has 2 daughters who live in [place] and [place] and 3 grandsons.   Medical History: Past Medical History  Diagnosis Date  . Allergy   . Hyperlipidemia   . Thyroid disease   . Arthritis   . Anxiety   .  Depression   . Hypertension   . Aortic insufficiency   . Murmur, heart   . Stroke ([place])     per pt. mild stroke  . Melanoma ([place])   . Cataract   . Macular degeneration   . Diverticulosis   . Adenomatous colon polyp 2012  . Basal cell carcinoma   .  Fibromyalgia   . Reflux   . Fibromyalgia     Current Medications:  Outpatient Encounter Prescriptions as of 04/12/2016  Medication Sig  . albuterol (PROVENTIL HFA;VENTOLIN HFA) 108 (90 Base) MCG/ACT inhaler Inhale 2 puffs into the lungs every 6 (six) hours as needed for wheezing or shortness of breath.  Marland Kitchen atorvastatin (LIPITOR) 40 MG tablet TAKE 1 TABLET BY MOUTH EVERY DAY  . chlorthalidone (HYGROTON) 25 MG tablet Take 1/2 tablet (12.5 mg) daily for blood pressure  . Cholecalciferol (VITAMIN D-3) 1000 UNITS CAPS Take 1,000 Units by mouth. Reported on 02/15/2016  . clonazePAM (KLONOPIN) 0.5 MG tablet TAKE 1 TABLET BY MOUTH TWICE DAILY AS NEEDED  . Cyanocobalamin (VITAMIN B 12 PO) Take 1 tablet by mouth daily.  . cycloSPORINE (RESTASIS) 0.05 % ophthalmic emulsion 1 drop 2 (two) times daily.  Marland Kitchen doxycycline (VIBRAMYCIN) 100 MG capsule Take 1 capsule (100 mg total) by mouth 2 (two) times daily.  . fluticasone (FLONASE) 50 MCG/ACT nasal spray INSTILL 2 SPRAYS IN EACH NOSTRIL EVERY DAY  . folic acid (FOLVITE) 1 MG tablet Take 2 tablets by mouth daily.  Marland Kitchen leflunomide (ARAVA) 10 MG tablet Take 10 mg by mouth daily.  . methotrexate (RHEUMATREX) 2.5 MG tablet Take 2.5 mg by mouth once a week. Caution:Chemotherapy. Protect from light.  . Multiple Vitamins-Minerals (PRESERVISION AREDS PO) Take by mouth. Reported on 02/15/2016  . naproxen sodium (ANAPROX) 220 MG tablet Take 220 mg by mouth as needed.    . predniSONE (DELTASONE) 20 MG tablet Take 1 pill daily for 3 days  . sertraline (ZOLOFT) 100 MG tablet Take 2 tablets (200 mg total) by mouth daily.  Marland Kitchen SYNTHROID 100 MCG tablet TAKE 1 TABLET BY MOUTH DAILY BEFORE BREAKFAST  . valsartan (DIOVAN) 160 MG tablet TAKE 1 TABLET BY MOUTH DAILY   No facility-administered encounter medications on file as of 04/12/2016.     Behavioral Observations:   Appearance: Neatly dressed and groomed Gait: Ambulated independently, no abnormalities observed Speech: Fluent;  normal rate, rhythm and volume; mild word finding difficulty Thought process: Linear, goal directed Affect: Stable, within normal limits, euthymic Interpersonal: Pleasant, appropriate   TESTING: There is medical necessity to proceed with neuropsychological assessment as the results will be used to aid in differential diagnosis and clinical decision-making and to inform specific treatment recommendations. Per the patient and medical records reviewed, there has been a change in cognitive functioning and a reasonable suspicion of mild cognitive impairment or developing dementia.   PLAN: The patient will return for a full battery of neuropsychological testing with a psychometrician under my supervision. Education regarding testing procedures was provided. Subsequently, the patient will see this provider for a follow-up session at which time her test performances and my impressions and treatment recommendations will be reviewed in detail.   Full neuropsychological evaluation report to follow.   45 minutes of time was spent face-to-face in clinical interview with this patient (CPT [phone])

## 2016-04-13 MED ORDER — PREDNISONE 20 MG PO TABS: ORAL_TABLET | ORAL | Status: DC

## 2016-04-13 NOTE — Telephone Encounter (Signed)
Called pt- she reports no fever (temp of 98, her normal is 97) but she is still coughing and feels congested in her chest.  She felt better while on prednisone.  Called in a few more days of treatment for her and she will keep me posted

## 2016-04-16 ENCOUNTER — Ambulatory Visit (HOSPITAL_COMMUNITY): Payer: Medicare Other

## 2016-04-16 ENCOUNTER — Telehealth: Payer: Self-pay | Admitting: *Deleted

## 2016-04-16 ENCOUNTER — Ambulatory Visit (INDEPENDENT_AMBULATORY_CARE_PROVIDER_SITE_OTHER): Payer: Medicare Other | Admitting: Family Medicine

## 2016-04-16 ENCOUNTER — Encounter: Payer: Self-pay | Admitting: Family Medicine

## 2016-04-16 ENCOUNTER — Other Ambulatory Visit: Payer: Self-pay | Admitting: Family Medicine

## 2016-04-16 ENCOUNTER — Ambulatory Visit (HOSPITAL_BASED_OUTPATIENT_CLINIC_OR_DEPARTMENT_OTHER)
Admission: RE | Admit: 2016-04-16 | Discharge: 2016-04-16 | Disposition: A | Discharge status: Home / Self Care | Payer: Medicare Other | Source: Ambulatory Visit | Attending: Family Medicine | Admitting: Family Medicine

## 2016-04-16 VITALS — BP 130/84 | HR 91 | Temp 97.7°F | Ht 60.0 in | Wt 149.2 lb

## 2016-04-16 DIAGNOSIS — R0989 Other specified symptoms and signs involving the circulatory and respiratory systems: Secondary | ICD-10-CM

## 2016-04-16 DIAGNOSIS — Q2548 Anomalous origin of subclavian artery: Secondary | ICD-10-CM | POA: Diagnosis not present

## 2016-04-16 DIAGNOSIS — R0602 Shortness of breath: Secondary | ICD-10-CM | POA: Insufficient documentation

## 2016-04-16 DIAGNOSIS — I77810 Thoracic aortic ectasia: Secondary | ICD-10-CM | POA: Insufficient documentation

## 2016-04-16 DIAGNOSIS — Z5181 Encounter for therapeutic drug level monitoring: Secondary | ICD-10-CM

## 2016-04-16 DIAGNOSIS — I700 Atherosclerosis of aorta: Secondary | ICD-10-CM | POA: Insufficient documentation

## 2016-04-16 LAB — BASIC METABOLIC PANEL
BUN: 16 mg/dL (ref 6–23)
CHLORIDE: 96 meq/L (ref 96–112)
CO2: 25 meq/L (ref 19–32)
CREATININE: 0.8 mg/dL (ref 0.40–1.20)
Calcium: 9 mg/dL (ref 8.4–10.5)
GFR: 74.9 mL/min (ref >60.00)
GLUCOSE: 90 mg/dL (ref 70–99)
Potassium: 3.7 mEq/L (ref 3.5–5.1)
Sodium: 131 mEq/L — ABNORMAL LOW (ref 135–145)

## 2016-04-16 LAB — D-DIMER, QUANTITATIVE: D-Dimer, Quant: 1.76 mcg/mL FEU — ABNORMAL HIGH (ref <0.50)

## 2016-04-16 MED ORDER — IPRATROPIUM-ALBUTEROL 0.5-2.5 (3) MG/3ML IN SOLN
3.0000 mL | Freq: Once | RESPIRATORY_TRACT | Status: AC
  Administered 2016-04-16: 3 mL via RESPIRATORY_TRACT

## 2016-04-16 MED ORDER — IPRATROPIUM-ALBUTEROL 0.5-2.5 (3) MG/3ML IN SOLN: 3.0000 mL | Freq: Four times a day (QID) | RESPIRATORY_TRACT | Status: DC

## 2016-04-16 MED ORDER — IOPAMIDOL (ISOVUE-370) INJECTION 76%
100.0000 mL | Freq: Once | INTRAVENOUS | Status: AC | PRN
  Administered 2016-04-16: 100 mL via INTRAVENOUS

## 2016-04-16 MED ORDER — IPRATROPIUM BROMIDE 0.02 % IN SOLN: 0.5000 mg | Freq: Once | RESPIRATORY_TRACT | Status: DC

## 2016-04-16 MED ORDER — MOMETASONE FUROATE 220 MCG/INH IN AEPB: 2.0000 | INHALATION_SPRAY | Freq: Every day | RESPIRATORY_TRACT | Status: DC

## 2016-04-16 MED ORDER — ALBUTEROL SULFATE (2.5 MG/3ML) 0.083% IN NEBU: 2.5000 mg | INHALATION_SOLUTION | Freq: Once | RESPIRATORY_TRACT | Status: DC

## 2016-04-16 MED ORDER — IPRATROPIUM-ALBUTEROL 0.5-2.5 (3) MG/3ML IN SOLN: 3.0000 mL | RESPIRATORY_TRACT | Status: DC | PRN

## 2016-04-16 NOTE — Patient Instructions (Addendum)
I will check a D dimer test for you today and will be in touch with the results asap Use the duoneb with your nebulizer machine as needed, and the asmanex as directed If your D dimer is high we will proceed with a CT scan

## 2016-04-16 NOTE — Progress Notes (Signed)
Pre visit review using our clinic review tool, if applicable. No additional management support is needed unless otherwise documented below in the visit note. 

## 2016-04-16 NOTE — Telephone Encounter (Signed)
Copy given to TC Dr. Serita Grit' nurse.

## 2016-04-16 NOTE — Progress Notes (Signed)
[place] [facility] [address], [place], [place] [address] [id number] [phone]  Date:  04/16/2016   Name:  Katherine Wang   DOB:  [birth year]   MRN:  [id number]  PCP:  Lamar Blinks, MD    Chief Complaint: Bronchitis   History of Present Illness:  Katherine Wang is a 70 y.o. very pleasant female patient who presents with the following:  Seen a week ago with chest congestion- cough, low grade temp to 100.3 CXR was negative at that time  Treated for bronchitis and bronchospasm with doxycycline and prednisone for 3 days.  Felt better while on pred, then called because her cough and congestion got worse again and we gave her more prednisone on 6/17 - she took it for a couple of days and then stopped as she developed a rash that we thought could be shingles.   Here today as she is still feeling bad, coughing and wheezing  She will cough if she tries to move around- better at rest The cough is usually dry She has not checked her temp but is not aware of any fever She has felt SOB and "like I can't get a good breath"  She is using her inhaler every 6 hours- she last used it last night  No glaucoma history No chest pain- "I'm just tight" in the chest    Patient Active Problem List   Diagnosis Date Noted  . Hx of adenomatous colonic polyps 06/21/2015  . Hyperlipidemia 11/15/2014  . Rheumatoid arthritis ([place]) 09/01/2014  . Pericarditis 04/24/2011  . Aortic valve disorder 02/14/2009  . ELECTROCARDIOGRAM, ABNORMAL 02/14/2009  . PERNICIOUS ANEMIA 07/10/2007  . CANDIDIASIS, ORAL 07/03/2007  . DEPRESSION 07/03/2007  . MALAISE AND FATIGUE 07/03/2007  . HYPOTHYROIDISM 04/30/2007  . HYPERTENSION 04/30/2007  . Aneurysm of thoracic aorta ([place]) 02/13/2006    Past Medical History  Diagnosis Date  . Allergy   . Hyperlipidemia   . Thyroid disease   . Arthritis   . Anxiety   . Depression   . Hypertension   . Aortic insufficiency   .  Murmur, heart   . Stroke ([place])     per pt. mild stroke  . Melanoma ([place])   . Cataract   . Macular degeneration   . Diverticulosis   . Adenomatous colon polyp 2012  . Basal cell carcinoma   . Fibromyalgia   . Reflux   . Fibromyalgia     Past Surgical History  Procedure Laterality Date  . Cholecystectomy    . Colonoscopy      30 + years ago, unsure of where  . Abdominal hysterectomy    . Lasik Bilateral   . Cardiac catheterization    . Nasal septum surgery    . Melanoma excision    . Appendectomy    . Eye surgery    . Tubal ligation    . Basal cell carcinoma excision      Social History  Substance Use Topics  . Smoking status: Former Smoker    Types: Cigarettes    Quit date: 06/21/1979  . Smokeless tobacco: Never Used  . Alcohol Use: No    Family History  Problem Relation Age of Onset  . Heart disease Mother   . Hypertension Mother   . Mental illness Mother   . Heart disease Father   . Hypertension Father   . Mental illness Father   . Hyperlipidemia Sister   . Hypertension  Sister   . Hypertension Sister   . Diabetes Sister     No Known Allergies  Medication list has been reviewed and updated.  Current Outpatient Prescriptions on File Prior to Visit  Medication Sig Dispense Refill  . albuterol (PROVENTIL HFA;VENTOLIN HFA) 108 (90 Base) MCG/ACT inhaler Inhale 2 puffs into the lungs every 6 (six) hours as needed for wheezing or shortness of breath. 18 g 2  . atorvastatin (LIPITOR) 40 MG tablet TAKE 1 TABLET BY MOUTH EVERY DAY 30 tablet 4  . chlorthalidone (HYGROTON) 25 MG tablet Take 1/2 tablet (12.5 mg) daily for blood pressure 45 tablet 3  . Cholecalciferol (VITAMIN D-3) 1000 UNITS CAPS Take 1,000 Units by mouth. Reported on 02/15/2016    . clonazePAM (KLONOPIN) 0.5 MG tablet TAKE 1 TABLET BY MOUTH TWICE DAILY AS NEEDED 60 tablet 0  . Cyanocobalamin (VITAMIN B 12 PO) Take 1 tablet by mouth daily.    . cycloSPORINE (RESTASIS) 0.05 % ophthalmic emulsion 1  drop 2 (two) times daily.    Marland Kitchen doxycycline (VIBRAMYCIN) 100 MG capsule Take 1 capsule (100 mg total) by mouth 2 (two) times daily. 20 capsule 0  . fluticasone (FLONASE) 50 MCG/ACT nasal spray INSTILL 2 SPRAYS IN EACH NOSTRIL EVERY DAY 16 g 1  . folic acid (FOLVITE) 1 MG tablet Take 2 tablets by mouth daily.    Marland Kitchen leflunomide (ARAVA) 10 MG tablet Take 10 mg by mouth daily.    . methotrexate (RHEUMATREX) 2.5 MG tablet Take 2.5 mg by mouth once a week. Caution:Chemotherapy. Protect from light.    . Multiple Vitamins-Minerals (PRESERVISION AREDS PO) Take by mouth. Reported on 02/15/2016    . naproxen sodium (ANAPROX) 220 MG tablet Take 220 mg by mouth as needed.      . predniSONE (DELTASONE) 20 MG tablet Take 1 pill daily for 3 -5 days. 10 tablet 0  . sertraline (ZOLOFT) 100 MG tablet Take 2 tablets (200 mg total) by mouth daily. 180 tablet 2  . SYNTHROID 100 MCG tablet TAKE 1 TABLET BY MOUTH DAILY BEFORE BREAKFAST 90 tablet 3  . valsartan (DIOVAN) 160 MG tablet TAKE 1 TABLET BY MOUTH DAILY 30 tablet 0   No current facility-administered medications on file prior to visit.    Review of Systems:  As per HPI- otherwise negative.   Physical Examination: Filed Vitals:   04/16/16 1143  BP: 130/84  Pulse: 91  Temp: 97.7 F (36.5 C)   Filed Vitals:   04/16/16 1143  Height: 5' (1.524 m)  Weight: 149 lb 3.2 oz (67.677 kg)   Body mass index is 29.14 kg/(m^2). Ideal Body Weight: Weight in (lb) to have BMI = 25: 127.7  GEN: WDWN, NAD, Non-toxic, A & O x 3, overweight, looks well HEENT: Atraumatic, Normocephalic. Neck supple. No masses, No LAD.  Bilateral TM wnl, oropharynx normal.  PEERL,EOMI.   Ears and Nose: No external deformity. CV: RRR, No M/G/R. No JVD. No thrill. No extra heart sounds. PULM: moderate wheezing and ronchi bilaterally. No retractions. No resp. distress. No accessory muscle use. EXTR: No c/c/e NEURO Normal gait.  PSYCH: Normally interactive. Conversant. Not depressed or  anxious appearing.  Calm demeanor.  Rash on her back has cleared- no evidence of shingles currently  Duoneb: cleared her lungs significantly and she did feel somewhat better  Assessment and Plan: Chest congestion - Plan: ipratropium-albuterol (DUONEB) 0.5-2.5 (3) MG/3ML SOLN, DME Nebulizer machine, mometasone (ASMANEX 60 METERED DOSES) 220 MCG/INH inhaler, D-Dimer, Quantitative, ipratropium-albuterol (DUONEB) 0.5-2.5 (3)  MG/3ML nebulizer solution 3 mL, DISCONTINUED: albuterol (PROVENTIL) (2.5 MG/3ML) 0.083% nebulizer solution 2.5 mg, DISCONTINUED: ipratropium (ATROVENT) nebulizer solution 0.5 mg, DISCONTINUED: ipratropium-albuterol (DUONEB) 0.5-2.5 (3) MG/3ML nebulizer solution 3 mL  Medication monitoring encounter - Plan: Basic Metabolic Panel (BMET)  SOB (shortness of breath) - Plan: CT Angio Chest W/Cm &/Or Wo Cm  Here today with persistent cough and wheezing, feeling SOB Discussed possibility of PE with her- will check a D dimer She did well with duoneb- rx for this medication and a neb machine Also add asmanex inhaled steroid  Meds ordered this encounter  Medications  . DISCONTD: albuterol (PROVENTIL) (2.5 MG/3ML) 0.083% nebulizer solution 2.5 mg    Sig:   . DISCONTD: ipratropium (ATROVENT) nebulizer solution 0.5 mg    Sig:   . ipratropium-albuterol (DUONEB) 0.5-2.5 (3) MG/3ML SOLN    Sig: Take 3 mLs by nebulization every 4 (four) hours as needed. Use every 4-6 hours    Dispense:  360 mL    Refill:  2  . mometasone (ASMANEX 60 METERED DOSES) 220 MCG/INH inhaler    Sig: Inhale 2 puffs into the lungs daily.    Dispense:  1 Inhaler    Refill:  2  . DISCONTD: ipratropium-albuterol (DUONEB) 0.5-2.5 (3) MG/3ML nebulizer solution 3 mL    Sig:   . ipratropium-albuterol (DUONEB) 0.5-2.5 (3) MG/3ML nebulizer solution 3 mL    Sig:     Signed Lamar Blinks, MD  Received her D dimer- found to be elevated. Sent for a CT angiogram with results as below.  Called pt to go over results.   [facility]  We will stay in touch regarding her progress. Also counseled that her sodium is mildly low- may increase salt intake, do not push water  Results for orders placed or performed in visit on 04/16/16  D-Dimer, Quantitative  Result Value Ref Range   D-Dimer, Quant 1.76 (H) <0.50 mcg/mL FEU  Basic Metabolic Panel (BMET)  Result Value Ref Range   Sodium 131 (L) 135 - 145 mEq/L   Potassium 3.7 3.5 - 5.1 mEq/L   Chloride 96 96 - 112 mEq/L   CO2 25 19 - 32 mEq/L   Glucose, Bld 90 70 - 99 mg/dL   BUN 16 6 - 23 mg/dL   Creatinine, Ser 0.80 0.40 - 1.20 mg/dL   Calcium 9.0 8.4 - 10.5 mg/dL   GFR 74.90 >60.00 mL/min     Dg Chest 2 View  04/09/2016  CLINICAL DATA:  Cough and congestion for 3 days. EXAM: CHEST  2 VIEW COMPARISON:  12/11/2014. FINDINGS: Trachea is midline. Heart size normal. Thoracic aorta is calcified. Right apical pleural thickening. Lungs are clear. No pleural fluid. S-shaped scoliosis. IMPRESSION: No acute findings. Electronically Signed   By: Lorin Picket M.D.   On: 04/09/2016 14:06   Ct Angio Chest W/cm &/or Wo Cm  04/16/2016  CLINICAL DATA:  Shortness of breath, 1 week duration. Elevated D-dimer. EXAM: CT ANGIOGRAPHY CHEST WITH CONTRAST TECHNIQUE: Multidetector CT imaging of the chest was performed using the standard protocol during bolus administration of intravenous contrast. Multiplanar CT image reconstructions and MIPs were obtained to evaluate the vascular anatomy. CONTRAST:  100 cc Isovue 370 COMPARISON:  Chest radiography 04/09/2016.  CT 05/12/2010. FINDINGS: Pulmonary arterial opacification is excellent. There are no pulmonary emboli. As seen previously, there is atherosclerosis of the aorta with an ectatic aortic arch and aberrant origin of the right subclavian artery as the  last branch. This creates a vascular ring in the upper mediastinum. Maximal diameter of the ascending aorta is 3.9 cm. Coronary artery  calcification is noted. There is a 2 mm calcified granuloma in the left upper lobe image 33. The lungs are otherwise clear. No infiltrate or collapse. No pleural or pericardial fluid. No hilar or mediastinal mass or adenopathy. Scans in the upper abdomen are unremarkable. Previous cholecystectomy. There is curvature in chronic degenerative change of the spine. No spinal fracture. Review of the MIP images confirms the above findings. IMPRESSION: No pulmonary emboli or other active chest disease by CT. Atherosclerosis of the aorta, brachiocephalic vessels and coronary arteries. Anomalous origin of the right subclavian artery as the last vessel from the arch. Ectasia of the ascending aorta, maximal diameter 3.9 cm. Electronically Signed   By: Nelson Chimes M.D.   On: 04/16/2016 17:43   Mr Jeri Cos [id number] Contrast  03/28/2016  CLINICAL DATA:  Difficulty with word-finding. Balance issues. Memory loss. Dizziness. EXAM: MRI HEAD WITHOUT AND WITH CONTRAST TECHNIQUE: Multiplanar, multiecho pulse sequences of the brain and surrounding structures were obtained without and with intravenous contrast. CONTRAST:  64mL MULTIHANCE GADOBENATE DIMEGLUMINE 529 MG/ML IV SOLN COMPARISON:  None. FINDINGS: The diffusion-weighted images demonstrate no evidence for acute or subacute infarction. Remote lacunar infarcts are present within the central pons and cerebellum. Remote lacunar infarcts are present within the caudate head bilaterally. Minimal periventricular T2 changes bilaterally are otherwise normal for age. The internal auditory canals are within normal limits bilaterally. Flow is present in the major intracranial arteries. The globes and orbits are intact. The paranasal sinuses are clear. Mastoid air cells are clear. Postcontrast images demonstrate no pathologic enhancement. IMPRESSION: 1. No acute intracranial abnormality. 2. Remote lacunar infarcts the brainstem, cerebellum bilaterally, and bilateral caudate head. Electronically  Signed   By: San Morelle M.D.   On: 03/28/2016 16:11

## 2016-04-16 NOTE — Telephone Encounter (Signed)
[facility] lab reporting critical D-dimer @ 1.76

## 2016-04-18 ENCOUNTER — Telehealth: Payer: Self-pay | Admitting: Cardiology

## 2016-04-18 ENCOUNTER — Encounter: Payer: Self-pay | Admitting: Family Medicine

## 2016-04-18 ENCOUNTER — Ambulatory Visit (INDEPENDENT_AMBULATORY_CARE_PROVIDER_SITE_OTHER): Payer: Medicare Other | Admitting: Family Medicine

## 2016-04-18 ENCOUNTER — Telehealth: Payer: Self-pay | Admitting: Family Medicine

## 2016-04-18 VITALS — BP 132/82 | HR 98 | Temp 98.2°F | Ht 60.0 in | Wt 149.6 lb

## 2016-04-18 DIAGNOSIS — R05 Cough: Secondary | ICD-10-CM | POA: Diagnosis not present

## 2016-04-18 DIAGNOSIS — R059 Cough, unspecified: Secondary | ICD-10-CM

## 2016-04-18 NOTE — Telephone Encounter (Addendum)
The pt is advised that as long as she feels ok she should keep her apt on 6/23 with Dr Aundra Dubin as she is 5 months overdue for f/u. She states that if she is not feeling well closer to appt date and time she will call to cancel her f/u and reschedule with Dr Aundra Dubin. She is aware that Dr Claris Gladden schedule is full and that she may have to wait for next available apt with him. She verbalized understanding.

## 2016-04-18 NOTE — Telephone Encounter (Signed)
Can be reached: [phone]   Reason for call: Pt called and is asking if she has used nebulizer wrong. She said that she is having trouble talking/lost voice in the last couple days. She doesn't know if she needs to be seen. Pt requesting call back.

## 2016-04-18 NOTE — Progress Notes (Signed)
Pre visit review using our clinic review tool, if applicable. No additional management support is needed unless otherwise documented below in the visit note. 

## 2016-04-18 NOTE — Progress Notes (Signed)
[place] [facility] [address], [place], [place] [address] [phone]  Date:  04/18/2016   Name:  Katherine Wang   DOB:  February 17, 1946   MRN:  [id number]  PCP:  Lamar Blinks, MD    Chief Complaint: Follow-up   History of Present Illness:  Katherine Wang is a 70 y.o. very pleasant female patient who presents with the following:  Seen here 2 days ago with persistent SOB, did a CT angiogram.  I started her on asmanex and also gave her a neb machine with duoneb medication.   Yesterday she took a duoneb treatment and noted some pain in her left back and painful cough after she used the neb It did not seem to help her like when she had the treatment here in clinic; she wondered if perhaps she did not use the machine correctly.   This am when she awoke she had lost her voice temporarily.   Her voice has now returned  Went over nebulizer machine together- she is using it correctly. No treatment yet today so we went ahead and gave her a treatment using her own medication   Patient Active Problem List   Diagnosis Date Noted  . Hx of adenomatous colonic polyps 06/21/2015  . Hyperlipidemia 11/15/2014  . Rheumatoid arthritis ([facility]) 09/01/2014  . Pericarditis 04/24/2011  . Aortic valve disorder 02/14/2009  . ELECTROCARDIOGRAM, ABNORMAL 02/14/2009  . PERNICIOUS ANEMIA 07/10/2007  . CANDIDIASIS, ORAL 07/03/2007  . DEPRESSION 07/03/2007  . MALAISE AND FATIGUE 07/03/2007  . HYPOTHYROIDISM 04/30/2007  . HYPERTENSION 04/30/2007  . Aneurysm of thoracic aorta ([place]) 02/13/2006    Past Medical History  Diagnosis Date  . Allergy   . Hyperlipidemia   . Thyroid disease   . Arthritis   . Anxiety   . Depression   . Hypertension   . Aortic insufficiency   . Murmur, heart   . Stroke ([place])     per pt. mild stroke  . Melanoma ([place])   . Cataract   . Macular degeneration   . Diverticulosis   . Adenomatous colon polyp 2012  . Basal cell  carcinoma   . Fibromyalgia   . Reflux   . Fibromyalgia     Past Surgical History  Procedure Laterality Date  . Cholecystectomy    . Colonoscopy      30 + years ago, unsure of where  . Abdominal hysterectomy    . Lasik Bilateral   . Cardiac catheterization    . Nasal septum surgery    . Melanoma excision    . Appendectomy    . Eye surgery    . Tubal ligation    . Basal cell carcinoma excision      Social History  Substance Use Topics  . Smoking status: Former Smoker    Types: Cigarettes    Quit date: 06/21/1979  . Smokeless tobacco: Never Used  . Alcohol Use: No    Family History  Problem Relation Age of Onset  . Heart disease Mother   . Hypertension Mother   . Mental illness Mother   . Heart disease Father   . Hypertension Father   . Mental illness Father   . Hyperlipidemia Sister   . Hypertension Sister   . Hypertension Sister   . Diabetes Sister     No Known Allergies  Medication list has been reviewed and updated.  Current Outpatient Prescriptions on File Prior to Visit  Medication  Sig Dispense Refill  . albuterol (PROVENTIL HFA;VENTOLIN HFA) 108 (90 Base) MCG/ACT inhaler Inhale 2 puffs into the lungs every 6 (six) hours as needed for wheezing or shortness of breath. 18 g 2  . atorvastatin (LIPITOR) 40 MG tablet TAKE 1 TABLET BY MOUTH EVERY DAY 30 tablet 4  . chlorthalidone (HYGROTON) 25 MG tablet Take 1/2 tablet (12.5 mg) daily for blood pressure 45 tablet 3  . Cholecalciferol (VITAMIN D-3) 1000 UNITS CAPS Take 1,000 Units by mouth. Reported on 02/15/2016    . clonazePAM (KLONOPIN) 0.5 MG tablet TAKE 1 TABLET BY MOUTH TWICE DAILY AS NEEDED 60 tablet 0  . Cyanocobalamin (VITAMIN B 12 PO) Take 1 tablet by mouth daily.    . cycloSPORINE (RESTASIS) 0.05 % ophthalmic emulsion 1 drop 2 (two) times daily.    Marland Kitchen doxycycline (VIBRAMYCIN) 100 MG capsule Take 1 capsule (100 mg total) by mouth 2 (two) times daily. 20 capsule 0  . fluticasone (FLONASE) 50 MCG/ACT nasal  spray INSTILL 2 SPRAYS IN EACH NOSTRIL EVERY DAY 16 g 1  . folic acid (FOLVITE) 1 MG tablet Take 2 tablets by mouth daily.    Marland Kitchen ipratropium-albuterol (DUONEB) 0.5-2.5 (3) MG/3ML SOLN Take 3 mLs by nebulization every 4 (four) hours as needed. Use every 4-6 hours 360 mL 2  . leflunomide (ARAVA) 10 MG tablet Take 10 mg by mouth daily.    . methotrexate (RHEUMATREX) 2.5 MG tablet Take 2.5 mg by mouth once a week. Caution:Chemotherapy. Protect from light.    . mometasone (ASMANEX 60 METERED DOSES) 220 MCG/INH inhaler Inhale 2 puffs into the lungs daily. 1 Inhaler 2  . Multiple Vitamins-Minerals (PRESERVISION AREDS PO) Take by mouth. Reported on 02/15/2016    . naproxen sodium (ANAPROX) 220 MG tablet Take 220 mg by mouth as needed.      . predniSONE (DELTASONE) 20 MG tablet Take 1 pill daily for 3 -5 days. 10 tablet 0  . sertraline (ZOLOFT) 100 MG tablet Take 2 tablets (200 mg total) by mouth daily. 180 tablet 2  . SYNTHROID 100 MCG tablet TAKE 1 TABLET BY MOUTH DAILY BEFORE BREAKFAST 90 tablet 3  . valsartan (DIOVAN) 160 MG tablet TAKE 1 TABLET BY MOUTH DAILY 30 tablet 0   No current facility-administered medications on file prior to visit.    Review of Systems:  As per HPI- otherwise negative.   Physical Examination: Filed Vitals:   04/18/16 1413  BP: 132/82  Pulse: 98  Temp: 98.2 F (36.8 C)   Filed Vitals:   04/18/16 1413  Height: 5' (1.524 m)  Weight: 149 lb 9.6 oz (67.858 kg)   Body mass index is 29.22 kg/(m^2). Ideal Body Weight: Weight in (lb) to have BMI = 25: 127.7  GEN: WDWN, NAD, Non-toxic, A & O x 3, overweight, looks well HEENT: Atraumatic, Normocephalic. Neck supple. No masses, No LAD. Ears and Nose: No external deformity. CV: RRR, No M/G/R. No JVD. No thrill. No extra heart sounds. PULM: CTA B, no wheezes, crackles, rhonchi. No retractions. No resp. distress. No accessory muscle use. Lungs sound clear today EXTR: No c/c/e NEURO Normal gait.  PSYCH: Normally  interactive. Conversant. Not depressed or anxious appearing.  Calm demeanor.    Assessment and Plan: Cough here today to recheck recent cough Chest CT was normal 2 days ago Reassured that she is using her neb machine correctly. She will continue to use her medications and I will check on her tomorrow     Signed Lamar Blinks, MD

## 2016-04-18 NOTE — Telephone Encounter (Signed)
New Message  Pt requesting to speak with RN about set appt for 6/23. Pt states she has bronchitis and does not know if she should keep the appt. Pt concern if doctor will be able to hear her heart beat being that she is sick. Please call back to discuss

## 2016-04-18 NOTE — Telephone Encounter (Signed)
Pt decided to come in and be seen

## 2016-04-19 ENCOUNTER — Telehealth: Payer: Self-pay | Admitting: Family Medicine

## 2016-04-19 DIAGNOSIS — R062 Wheezing: Secondary | ICD-10-CM

## 2016-04-19 DIAGNOSIS — E871 Hypo-osmolality and hyponatremia: Secondary | ICD-10-CM

## 2016-04-19 DIAGNOSIS — J209 Acute bronchitis, unspecified: Secondary | ICD-10-CM

## 2016-04-20 ENCOUNTER — Ambulatory Visit (INDEPENDENT_AMBULATORY_CARE_PROVIDER_SITE_OTHER): Payer: Medicare Other | Admitting: Cardiology

## 2016-04-20 ENCOUNTER — Encounter: Payer: Self-pay | Admitting: Cardiology

## 2016-04-20 ENCOUNTER — Encounter: Payer: Self-pay | Admitting: Family Medicine

## 2016-04-20 VITALS — BP 122/80 | HR 86 | Ht 60.0 in | Wt 150.0 lb

## 2016-04-20 DIAGNOSIS — Z79899 Other long term (current) drug therapy: Secondary | ICD-10-CM | POA: Diagnosis not present

## 2016-04-20 DIAGNOSIS — I359 Nonrheumatic aortic valve disorder, unspecified: Secondary | ICD-10-CM

## 2016-04-20 DIAGNOSIS — I10 Essential (primary) hypertension: Secondary | ICD-10-CM

## 2016-04-20 DIAGNOSIS — R0602 Shortness of breath: Secondary | ICD-10-CM

## 2016-04-20 DIAGNOSIS — E785 Hyperlipidemia, unspecified: Secondary | ICD-10-CM | POA: Diagnosis not present

## 2016-04-20 LAB — LIPID PANEL
CHOL/HDL RATIO: 2.8 ratio (ref <=5.0)
Cholesterol: 147 mg/dL (ref 125–200)
HDL: 52 mg/dL (ref >=46)
LDL Cholesterol: 77 mg/dL (ref <130)
Triglycerides: 91 mg/dL (ref <150)
VLDL: 18 mg/dL (ref <30)

## 2016-04-20 MED ORDER — ASPIRIN EC 81 MG PO TBEC: 81.0000 mg | DELAYED_RELEASE_TABLET | Freq: Every day | ORAL | Status: AC

## 2016-04-20 MED ORDER — VALSARTAN 160 MG PO TABS: 160.0000 mg | ORAL_TABLET | Freq: Two times a day (BID) | ORAL | Status: DC

## 2016-04-20 NOTE — Patient Instructions (Addendum)
Medication Instructions:  Your physician has recommended you make the following change in your medication:  1) START Aspirin 81 mg daily 2) INCREASE Valsartan to 160 mg twice daily  Labwork: Today: BNP & Lipid panel  Your physician recommends that you return for lab work in: 2 weeks for fasting lipid panel and BMET   Testing/Procedures: Your physician has requested that you have an echocardiogram. Echocardiography is a painless test that uses sound waves to create images of your heart. It provides your doctor with information about the size and shape of your heart and how well your heart's chambers and valves are working. This procedure takes approximately one hour. There are no restrictions for this procedure.  Follow-Up: You have been referred to pulmonology, Dr. Lake Bells for shortness of breath.  Your physician wants you to follow-up in: 6 months with Dr. Aundra Dubin. You will receive a reminder letter in the mail two months in advance. If you don't receive a letter, please call our office to schedule the follow-up appointment.  Any Other Special Instructions Will Be Listed Below (If Applicable).  If you need a refill on your cardiac medications before your next appointment, please call your pharmacy.  Thank you for choosing [facility]!!

## 2016-04-21 LAB — BRAIN NATRIURETIC PEPTIDE: Brain Natriuretic Peptide: 19.4 pg/mL (ref <100)

## 2016-04-22 NOTE — Progress Notes (Signed)
Patient ID: Katherine Wang, female   DOB: 1946/03/04, 70 y.o.   MRN: [id number] PCP: Dr. Janett Billow Copland  70 yo with history of rheumatoid arthritis, aortic insufficiency, mild ascending aortic dilatation, and suspected prior acute pericarditis returns for cardiology followup.  Patient's cardiac history begins in 2004 when she had possible acute pericarditis in [place].  LHC at that time showed no significant disease.  In our office, she has been followed for a malformed (though trileaflet) aortic valve.  She has had aortic insufficiency and a mildly dilated ascending aorta.  She moved to [place] in 2012 to help her daughter who has an autistic child.  She is back in [place] now.  She says she had a stress test at some time in 83 in [place].  She thinks it was normal.  Last echo in 2/16 showed EF 50-55% with moderate AI and 4.0 cm ascending aorta.  CTA chest in 6/17 showed 3.9 cm ascending aorta.   No chest pain.  She can walk around the block without dyspnea but is short of breath walking up a flight of steps.  No orthopnea, PND, lightheadedness, or palpitations.  She has had a cough/congestion for about 3 weeks and occasional wheezing.  She was given a course of doxycycline and is slowly improving.  She says that even prior to this episode, she has occasional wheezing.  BP is running high today and has been high at times at home.  Weight stable.   ECG: NSR, 1/2 mm ST depression inferiorly and anterolaterally (looked similarly prior).   Labs (6/12): BNP 86, ESR 83 Labs (11/15): K 3.8, creatinine 0.94, TSH normal Labs (6/17): Na 131, K 3.7, creatinine 0.8  PMH: 1. Acute pericarditis in 2004: Hospitalized in [place].  Had LHC at that time that she reports as showing no significant CAD.  Possible recurrent pericarditis 6/12.  2. 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  Last echo (4/10) showed EF 55-60%, mild LV dilation, mild LV hypertrophy, mild aortic  insufficiency, mild MR.  Cardiac MRI (4/10) confirmed that the aortic valve was trileaflet but malformed, EF 64%.  - Echo (6/12): EF 50%, moderate (grade II) diastolic dysfunction, mild AI, mild MR, no pericardial effusion.   - Echo (2/16): EF 50-55%, moderate AI, ascending aorta 4.0 cm, mild MR.  3.  Ascending aorta dilation: MRA chest (4/10) with 4 x 3.6 cm ascending aorta.  CTA chest (7/11) with 3.7 x 3.7 cm ascending aorta and aberrant right subclavian course.  - CTA chest 6/17 with 3.9 cm ascending aorta.  4.  HTN 5.  Rheumatoid arthritis  6.  H/o rheumatic fever in childhood.  7. Hyperlipidemia 8. Aberrant course right subclavian artery  SH: Lives alone, single.  Divorced, 2 daughters.  Works a Camera operator.  Quit smoking in the 1980s.    FH: Mother with angina.   Father with MI at 75.    ROS: All systems reviewed and negative except as per HPI.   Current Outpatient Prescriptions  Medication Sig Dispense Refill  . albuterol (PROVENTIL HFA;VENTOLIN HFA) 108 (90 Base) MCG/ACT inhaler Inhale 2 puffs into the lungs every 6 (six) hours as needed for wheezing or shortness of breath. 18 g 2  . atorvastatin (LIPITOR) 40 MG tablet TAKE 1 TABLET BY MOUTH EVERY DAY 30 tablet 4  . chlorthalidone (HYGROTON) 25 MG tablet Take 1/2 tablet (12.5 mg) daily for blood pressure 45 tablet 3  . Cholecalciferol (VITAMIN D-3) 1000 UNITS CAPS Take 1,000  Units by mouth. Reported on 02/15/2016    . clonazePAM (KLONOPIN) 0.5 MG tablet TAKE 1 TABLET BY MOUTH TWICE DAILY AS NEEDED 60 tablet 0  . Cyanocobalamin (VITAMIN B 12 PO) Take 1 tablet by mouth daily.    . cycloSPORINE (RESTASIS) 0.05 % ophthalmic emulsion 1 drop 2 (two) times daily.    . fluticasone (FLONASE) 50 MCG/ACT nasal spray INSTILL 2 SPRAYS IN EACH NOSTRIL EVERY DAY 16 g 1  . folic acid (FOLVITE) 1 MG tablet Take 2 tablets by mouth daily.    Marland Kitchen ipratropium-albuterol (DUONEB) 0.5-2.5 (3) MG/3ML SOLN Take 3 mLs by nebulization every 4 (four)  hours as needed. Use every 4-6 hours 360 mL 2  . leflunomide (ARAVA) 10 MG tablet Take 10 mg by mouth daily.    . methotrexate (RHEUMATREX) 2.5 MG tablet Take 2.5 mg by mouth once a week. Caution:Chemotherapy. Protect from light.    . Multiple Vitamins-Minerals (PRESERVISION AREDS PO) Take by mouth. Reported on 02/15/2016    . naproxen sodium (ANAPROX) 220 MG tablet Take 220 mg by mouth as needed.      . predniSONE (DELTASONE) 20 MG tablet Take 1 pill daily for 3 -5 days. 10 tablet 0  . sertraline (ZOLOFT) 100 MG tablet Take 2 tablets (200 mg total) by mouth daily. 180 tablet 2  . SYNTHROID 100 MCG tablet TAKE 1 TABLET BY MOUTH DAILY BEFORE BREAKFAST 90 tablet 3  . valsartan (DIOVAN) 160 MG tablet Take 1 tablet (160 mg total) by mouth 2 (two) times daily. 60 tablet 6  . aspirin EC 81 MG tablet Take 1 tablet (81 mg total) by mouth daily. 90 tablet 3   No current facility-administered medications for this visit.    BP 160/90 mmHg  Pulse 86  Ht 5' (1.524 m)  Wt 150 lb (68.04 kg)  BMI 29.30 kg/m2 General: NAD Neck: No JVD, no thyromegaly or thyroid nodule.  Lungs: Occasional rhonchi. CV: Nondisplaced PMI.  Heart regular S1/S2, no Z7/Q9, 1/6 diastolic murmur RUSB.  No peripheral edema.  No carotid bruit.  Normal pedal pulses.  Abdomen: Soft, nontender, no hepatosplenomegaly, no distention.  Skin: Intact without lesions or rashes.  Neurologic: Alert and oriented x 3.  Psych: Normal affect. Extremities: No clubbing or cyanosis.  HEENT: Normal.   Assessment/Plan: 1. Aortic valve disorder: Malformed aortic valve with moderate AI on 11/2014 echo.  She has mild exertional symptoms.  I will get an echo to reassess the aortic valve.  2. Ascending aorta dilation: Mild on CTA chest in 6/17.   3. HTN: BP running high, increase valsartan to 160 mg bid.  Would hold off on increasing chlorthalidone with mild hyponatremia. BMET in 2 wks.   4. Hyperlipidemia: Continue atorvastatin, check lipids today.   5. Wheezing: Suspect asthma.  Will refer to pulmonary for evaluation.   Followup in 6 months.   Loralie Champagne 04/22/2016

## 2016-04-23 ENCOUNTER — Telehealth: Payer: Self-pay | Admitting: Cardiology

## 2016-04-23 DIAGNOSIS — H353132 Nonexudative age-related macular degeneration, bilateral, intermediate dry stage: Secondary | ICD-10-CM | POA: Diagnosis not present

## 2016-04-23 MED ORDER — CEFDINIR 300 MG PO CAPS: 300.0000 mg | ORAL_CAPSULE | Freq: Two times a day (BID) | ORAL | Status: DC

## 2016-04-23 NOTE — Telephone Encounter (Signed)
Do you prefer she see Dr. Lake Bells or are you ok with her seeing anyone in the practice that has availability?

## 2016-04-23 NOTE — Telephone Encounter (Signed)
Spoke with pt and informed her ok to see any of the pulmonologist. Pt verbalized understanding and will call to get in sooner.

## 2016-04-23 NOTE — Telephone Encounter (Signed)
Called her to check in- she notes that she continues to cough and that she notes congestion in her chest, sinuses and in her right ear. We used 10 days of doxycycline about 2 weeks ago.    Will try a course of omnicef for 10 days as other therapies have not relieved her sx so far She plans to repeat a BMP in about 2 weeks to check on mild hyponatremia

## 2016-04-23 NOTE — Telephone Encounter (Signed)
New Message:   Pt saw Dr Aundra Dubin on Friday. He wanted her to see Dr Lake Bells at [facility]. His first available date is August 28th. Another doctor there could see her next week,which one does he wants her to see?

## 2016-04-23 NOTE — Telephone Encounter (Signed)
Any pulmonary would be ok.

## 2016-04-24 ENCOUNTER — Encounter: Payer: Self-pay | Admitting: Family Medicine

## 2016-04-25 ENCOUNTER — Ambulatory Visit (INDEPENDENT_AMBULATORY_CARE_PROVIDER_SITE_OTHER): Payer: Medicare Other | Admitting: Internal Medicine

## 2016-04-25 ENCOUNTER — Encounter: Payer: Self-pay | Admitting: Internal Medicine

## 2016-04-25 ENCOUNTER — Institutional Professional Consult (permissible substitution): Payer: Medicare Other | Admitting: Internal Medicine

## 2016-04-25 VITALS — BP 106/80 | HR 80 | Ht 60.0 in | Wt 151.4 lb

## 2016-04-25 DIAGNOSIS — R058 Other specified cough: Secondary | ICD-10-CM | POA: Insufficient documentation

## 2016-04-25 DIAGNOSIS — R05 Cough: Secondary | ICD-10-CM

## 2016-04-25 MED ORDER — FAMOTIDINE 20 MG PO TABS: ORAL_TABLET | ORAL | Status: DC

## 2016-04-25 MED ORDER — PANTOPRAZOLE SODIUM 40 MG PO TBEC
40.0000 mg | DELAYED_RELEASE_TABLET | Freq: Every day | ORAL | Status: DC
Start: 2016-04-25 — End: 2016-05-08

## 2016-04-25 MED ORDER — FLUTTER DEVI: Status: DC

## 2016-04-25 MED ORDER — PREDNISONE 10 MG PO TABS: ORAL_TABLET | ORAL | Status: DC

## 2016-04-25 MED ORDER — TRAMADOL HCL 50 MG PO TABS: ORAL_TABLET | ORAL | Status: DC

## 2016-04-25 NOTE — Progress Notes (Signed)
Subjective:    Patient ID: Katherine Wang, female    DOB: 1946-05-14    MRN: [id number]  HPI  80 yowf quit smoking in 1985 with no resp problems, able to built up walking to 30 minutes but developed knee around 2000 dx RA and breathing problems requiring saba then around early June 2017 noted short of breath of worse than usual assoc with loss of voice/ cough referred to pulmonary clinic 04/25/2016 by Dr Katherine Wang    04/25/2016 1st [facility] office visit/ Katherine Wang   Chief Complaint  Patient presents with  . Pulmonary Consult    Referred by Dr. Loralie Wang. Pt c/o cough and SOB x 3 wks. Her cough is prod with white sputum. It bothers her most during the day, but occ wakes her up in the night. She states that she feels SOB "all the time"- worse with exertion. She has also noticed some wheezing.   indolent onset of sob x 3 weeks assoc with mostly dry cough ? Worse on asthmanex powder   For "many months"  has noted "wheezing" at hs prior to onset of her sob daytime but did not disturb sleep pred helped a little but only took 20 mg x 3 days  RA is worse and may need to start injections  (Katherine Wang)   No obvious day to day or daytime variability or assoc excess/ purulent sputum or mucus plugs or hemoptysis or cp or chest tightness, subjective wheeze or overt sinus or hb symptoms. No unusual exp hx or h/o childhood pna/ asthma or knowledge of premature birth.  Sleeping ok without nocturnal  or early am exacerbation  of respiratory  c/o's or need for noct saba. Also denies any obvious fluctuation of symptoms with weather or environmental changes or other aggravating or alleviating factors except as outlined above   Current Medications, Allergies, Complete Past Medical History, Past Surgical History, Family History, and Social History were reviewed in [employer] record.             Review of Systems  Constitutional: Negative for fever, chills and unexpected  weight change.  HENT: Positive for congestion, ear pain and sneezing. Negative for dental problem, nosebleeds, postnasal drip, rhinorrhea, sinus pressure, sore throat, trouble swallowing and voice change.   Eyes: Negative for visual disturbance.  Respiratory: Positive for cough and shortness of breath. Negative for choking.   Cardiovascular: Negative for chest pain and leg swelling.  Gastrointestinal: Negative for vomiting, abdominal pain and diarrhea.  Genitourinary: Negative for difficulty urinating.  Musculoskeletal: Positive for arthralgias.  Skin: Negative for rash.  Neurological: Negative for tremors, syncope and headaches.  Hematological: Does not bruise/bleed easily.       Objective:   Physical Exam  amb hoarse wf nad  Wt Readings from Last 3 Encounters:  04/25/16 151 lb 6.4 oz (68.675 kg)  04/20/16 150 lb (68.04 kg)  04/18/16 149 lb 9.6 oz (67.858 kg)    Vital signs reviewed   HEENT: top denture/ lower partial , turbinates, and oropharynx. Nl external ear canals without cough reflex   NECK :  without JVD/Nodes/TM/ nl carotid upstrokes bilaterally   LUNGS: no acc muscle use,  Nl contour chest which is clear to A and P bilaterally with severe coughing fits on insp or exp and prominent pseudowheeze   CV:  RRR  no s3 or murmur or increase in P2, no edema   ABD:  soft and nontender with nl inspiratory excursion in the supine  position. No bruits or organomegaly, bowel sounds nl  MS:  Nl gait/ ext warm without deformities, calf tenderness, cyanosis or clubbing No obvious joint restrictions   SKIN: warm and dry without lesions    NEURO:  alert, approp, nl sensorium with  no motor deficits      I personally reviewed images and agree with radiology impression as follows:  CT Chest   04/16/16 No pulmonary emboli or other active chest disease by CT.  Atherosclerosis of the aorta, brachiocephalic vessels and coronary arteries. 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  Ectasia of the ascending aorta, maximal diameter 3.9 cm.     Assessment & Plan:

## 2016-04-25 NOTE — Assessment & Plan Note (Signed)
The most common causes of chronic cough in immunocompetent adults include the following: upper airway cough syndrome (UACS), previously referred to as postnasal drip syndrome (PNDS), which is caused by variety of rhinosinus conditions; (2) asthma; (3) GERD; (4) chronic bronchitis from cigarette smoking or other inhaled environmental irritants; (5) nonasthmatic eosinophilic bronchitis; and (6) bronchiectasis.   These conditions, singly or in combination, have accounted for up to 94% of the causes of chronic cough in prospective studies.   Other conditions have constituted no >6% of the causes in prospective studies These have included bronchogenic carcinoma, chronic interstitial pneumonia, sarcoidosis, left ventricular failure, ACEI-induced cough, and aspiration from a condition associated with pharyngeal dysfunction.    Chronic cough is often simultaneously caused by more than one condition. A single cause has been found from 38 to 82% of the time, multiple causes from 18 to 62%. Multiply caused cough has been the result of three diseases up to 42% of the time.       Based on hx and exam strongly favor not asthma at all but rather : Classic Upper airway cough syndrome, so named because it's frequently impossible to sort out how much is  CR/sinusitis with freq throat clearing (which can be related to primary GERD)   vs  causing  secondary (" extra esophageal")  GERD from wide swings in gastric pressure that occur with throat clearing, often  promoting self use of mint and menthol lozenges that reduce the lower esophageal sphincter tone and exacerbate the problem further in a cyclical fashion.   These are the same pts (now being labeled as having "irritable larynx syndrome" by some cough centers) who not infrequently have a history of having failed to tolerate ace inhibitors,  dry powder inhalers (like asthmanex) or biphosphonates or report having atypical reflux symptoms that don't respond to standard  doses of PPI , and are easily confused as having aecopd or asthma flares by even experienced allergists/ pulmonologists.   Given RA poor control also need to consider CA here (crycoaretenoiditis) assoc with severe hoarseness to point of VCD.   rec  pred 20 mg daily then 10  Mg daily x until seen and taper off then Control cough with tramadol/ flutter valve Control "wheezing" just with the neb which does not typically aggravate uacs Max rx for gerd with ppi qam /h2hs and diet    Flutter valve training done  Total time devoted to counseling  = 35/28m review case with pt/ discussion of options/alternatives/ personally creating written instructions  in presence of pt  then going over those specific  Instructions directly with the pt including how to use all of the meds but in particular covering each new medication in detail and the difference between the maintenance/automatic meds and the prns using an action plan format for the latter.

## 2016-04-25 NOTE — Patient Instructions (Addendum)
Prednisone 10 mg x 2 every day until you feel better then 1 daily until return   Pantoprazole (protonix) 40 mg   Take  30-60 min before first meal of the day and Pepcid (famotidine)  20 mg one @  bedtime until return to office - this is the best way to tell whether stomach acid is contributing to your problem.    For short of breath > duoneb  Up 4 hours as needed  For Cough > flutter valve   Take delsym two tsp every 12 hours and supplement if needed with  tramadol 50 mg up to 2 every 4 hours to suppress the urge to cough. Swallowing water or using ice chips/non mint and menthol containing candies (such as lifesavers or sugarless jolly ranchers) are also effective.  You should rest your voice and avoid activities that you know make you cough.  Once you have eliminated the cough for 3 straight days try reducing the tramadol first,  then the delsym as tolerated.     GERD (REFLUX)  is an extremely common cause of respiratory symptoms just like yours , many times with no obvious heartburn at all.    It can be treated with medication, but also with lifestyle changes including elevation of the head of your bed (ideally with 6 inch  bed blocks),  Smoking cessation, avoidance of late meals, excessive alcohol, and avoid fatty foods, chocolate, peppermint, colas, red wine, and acidic juices such as orange juice.  NO MINT OR MENTHOL PRODUCTS SO NO COUGH DROPS  USE SUGARLESS CANDY INSTEAD (Jolley ranchers or Stover's or Life Savers) or even ice chips will also do - the key is to swallow to prevent all throat clearing. NO OIL BASED VITAMINS - use powdered substitutes.    Please schedule a follow up office visit in 2 weeks, sooner if needed

## 2016-04-26 ENCOUNTER — Encounter: Payer: Medicare Other | Admitting: Psychology

## 2016-04-26 ENCOUNTER — Encounter: Payer: Self-pay | Admitting: Family Medicine

## 2016-04-26 DIAGNOSIS — M79641 Pain in right hand: Secondary | ICD-10-CM | POA: Diagnosis not present

## 2016-04-26 DIAGNOSIS — Z09 Encounter for follow-up examination after completed treatment for conditions other than malignant neoplasm: Secondary | ICD-10-CM | POA: Diagnosis not present

## 2016-04-26 DIAGNOSIS — I712 Thoracic aortic aneurysm, without rupture: Secondary | ICD-10-CM

## 2016-04-26 DIAGNOSIS — M174 Other bilateral secondary osteoarthritis of knee: Secondary | ICD-10-CM | POA: Diagnosis not present

## 2016-04-26 DIAGNOSIS — M0579 Rheumatoid arthritis with rheumatoid factor of multiple sites without organ or systems involvement: Secondary | ICD-10-CM | POA: Diagnosis not present

## 2016-04-26 DIAGNOSIS — I7121 Aneurysm of the ascending aorta, without rupture: Secondary | ICD-10-CM

## 2016-05-02 ENCOUNTER — Other Ambulatory Visit: Payer: Self-pay | Admitting: Family Medicine

## 2016-05-04 ENCOUNTER — Other Ambulatory Visit (INDEPENDENT_AMBULATORY_CARE_PROVIDER_SITE_OTHER): Payer: Medicare Other | Admitting: *Deleted

## 2016-05-04 DIAGNOSIS — Z79899 Other long term (current) drug therapy: Secondary | ICD-10-CM | POA: Diagnosis not present

## 2016-05-04 LAB — BASIC METABOLIC PANEL
BUN: 13 mg/dL (ref 7–25)
CHLORIDE: 101 mmol/L (ref 98–110)
CO2: 27 mmol/L (ref 20–31)
Calcium: 8.7 mg/dL (ref 8.6–10.4)
Creat: 0.79 mg/dL (ref 0.60–0.93)
Glucose, Bld: 83 mg/dL (ref 65–99)
POTASSIUM: 4 mmol/L (ref 3.5–5.3)
Sodium: 138 mmol/L (ref 135–146)

## 2016-05-04 NOTE — Addendum Note (Signed)
Addended by: Eulis Foster on: 05/04/2016 09:54 AM   Modules accepted: Orders

## 2016-05-08 ENCOUNTER — Encounter: Payer: Self-pay | Admitting: Internal Medicine

## 2016-05-08 ENCOUNTER — Ambulatory Visit (INDEPENDENT_AMBULATORY_CARE_PROVIDER_SITE_OTHER): Payer: Medicare Other | Admitting: Internal Medicine

## 2016-05-08 VITALS — BP 118/76 | HR 91 | Ht 60.0 in | Wt 154.0 lb

## 2016-05-08 DIAGNOSIS — R05 Cough: Secondary | ICD-10-CM

## 2016-05-08 DIAGNOSIS — R058 Other specified cough: Secondary | ICD-10-CM

## 2016-05-08 NOTE — Patient Instructions (Addendum)
Please see patient coordinator before you leave today  to schedule sinus CT   Continue Pepcid 20 mg at bedtime until next office visit and go ahead tonight to see if can lie down flat again    If start having more cough or short of breath, first see if the duoneb help up to every 4 hours as needed   For cough > use the flutter valve as much as possible   Please schedule a follow up office visit in 2 weeks, sooner if needed

## 2016-05-08 NOTE — Progress Notes (Signed)
Subjective:    Patient ID: Katherine Wang, female    DOB: [birth year]    MRN: [id number]    Brief patient profile:  39 yowf quit smoking in 1985 with no resp problems, able to built up walking to 30 minutes but developed knee pain which severely slowed her down around 2000 dx RA and breathing problems requiring saba then around early June 2017 noted short of breath of worse than usual assoc with loss of voice/ cough referred to pulmonary clinic 04/25/2016 by Dr Loralie Champagne    History of Present Illness  04/25/2016 1st [facility] office visit/ Katherine Wang   Chief Complaint  Patient presents with  . Pulmonary Consult    Referred by Dr. Loralie Champagne. Pt c/o cough and SOB x 3 wks. Her cough is prod with white sputum. It bothers her most during the day, but occ wakes her up in the night. She states that she feels SOB "all the time"- worse with exertion. She has also noticed some wheezing.   indolent onset of sob x 3 weeks assoc with mostly dry cough ? Worse on asthmanex powder   For "many months"  has noted "wheezing" at hs prior to onset of her sob daytime but did not disturb sleep pred helped a little but only took 20 mg x 3 days  RA is worse and may need to start injections  (Deveshwar)  rec Prednisone 10 mg x 2 every day until you feel better then 1 daily until return  Pantoprazole (protonix) 40 mg   Take  30-60 min before first meal of the day and Pepcid (famotidine)  20 mg one @  bedtime until return to office  For short of breath > duoneb  Up 4 hours as needed For Cough > flutter valve plus take delsym two tsp every 12 hours and supplement if needed with  tramadol 50 mg up to 2 every 4 hours to suppress the urge to cough.  Once you have eliminated the cough for 3 straight days try reducing the tramadol first,  then the delsym as tolerated.   GERD diet    05/08/2016  f/u ov/Katherine Wang re:  uacs vs asthma - symptom onse early June/2017  - only took rx for gerd for a week, then d/c due to  "gas" Finished prednisone sev days prior to OV  Improved just a little with main concern now the cough   Chief Complaint  Patient presents with  . Follow-up    Cough and hoarseness have improved some. She is also having less SOB. She has not had to use albuterol inhaler.   has not tried to lie flat x months due to cough , symptoms are more sporadic daytime cough > sob Mucus is minimal/ mucoid/ mostly in ams   No obvious day to day or daytime variability or assoc excess/ purulent sputum or mucus plugs or hemoptysis or cp or chest tightness, subjective wheeze or overt sinus or hb symptoms. No unusual exp hx or h/o childhood pna/ asthma or knowledge of premature birth.  Sleeping ok at 30 degree  without nocturnal  or early am exacerbation  of respiratory  c/o's or need for noct saba. Also denies any obvious fluctuation of symptoms with weather or environmental changes or other aggravating or alleviating factors except as outlined above   Current Medications, Allergies, Complete Past Medical History, Past Surgical History, Family History, and Social History were reviewed in [employer] record.  ROS  The following  are not active complaints unless bolded sore throat, dysphagia, dental problems, itching, sneezing,  nasal congestion or excess/ purulent secretions, ear ache,   fever, chills, sweats, unintended wt loss, classically pleuritic or exertional cp,  orthopnea pnd or leg swelling, presyncope, palpitations, abdominal pain, anorexia, nausea, vomiting, diarrhea  or change in bowel or bladder habits, change in stools or urine, dysuria,hematuria,  rash, arthralgias, visual complaints, headache, numbness, weakness or ataxia or problems with walking or coordination,  change in mood/affect or memory.                 Objective:   Physical Exam  amb  wf nad slt nasal tone, voice fatigue   05/08/2016        154   04/25/16 151 lb 6.4 oz (68.675 kg)  04/20/16 150 lb (68.04 kg)   04/18/16 149 lb 9.6 oz (67.858 kg)    Vital signs reviewed   HEENT: top denture/ lower partial , turbinates, and oropharynx. Nl external ear canals without cough reflex   NECK :  without JVD/Nodes/TM/ nl carotid upstrokes bilaterally   LUNGS: no acc muscle use,  Nl contour chest which is clear to A and P bilaterally     CV:  RRR  no s3 or murmur or increase in P2, no edema   ABD:  soft and nontender with nl inspiratory excursion in the supine position. No bruits or organomegaly, bowel sounds nl  MS:  Nl gait/ ext warm without deformities, calf tenderness, cyanosis or clubbing No obvious joint restrictions   SKIN: warm and dry without lesions    NEURO:  alert, approp, nl sensorium with  no motor deficits      I personally reviewed images and agree with radiology impression as follows:  CT Chest   04/16/16 No pulmonary emboli or other active chest disease by CT.       Assessment & Plan:

## 2016-05-09 ENCOUNTER — Telehealth: Payer: Self-pay | Admitting: Cardiology

## 2016-05-09 NOTE — Telephone Encounter (Signed)
New message  ° ° °Patient returning call back to nurse  - test results  °

## 2016-05-09 NOTE — Telephone Encounter (Signed)
Spoke with patient about recent lab results 

## 2016-05-10 ENCOUNTER — Ambulatory Visit (INDEPENDENT_AMBULATORY_CARE_PROVIDER_SITE_OTHER)
Admission: RE | Admit: 2016-05-10 | Discharge: 2016-05-10 | Disposition: A | Discharge status: Home / Self Care | Payer: Medicare Other | Source: Ambulatory Visit | Attending: Internal Medicine | Admitting: Internal Medicine

## 2016-05-10 ENCOUNTER — Other Ambulatory Visit: Payer: Self-pay

## 2016-05-10 ENCOUNTER — Other Ambulatory Visit: Payer: Self-pay | Admitting: Internal Medicine

## 2016-05-10 ENCOUNTER — Ambulatory Visit (HOSPITAL_COMMUNITY): Payer: Medicare Other | Attending: Internal Medicine

## 2016-05-10 DIAGNOSIS — I359 Nonrheumatic aortic valve disorder, unspecified: Secondary | ICD-10-CM | POA: Diagnosis not present

## 2016-05-10 DIAGNOSIS — I351 Nonrheumatic aortic (valve) insufficiency: Secondary | ICD-10-CM | POA: Insufficient documentation

## 2016-05-10 DIAGNOSIS — R05 Cough: Secondary | ICD-10-CM

## 2016-05-10 DIAGNOSIS — I517 Cardiomegaly: Secondary | ICD-10-CM | POA: Insufficient documentation

## 2016-05-10 DIAGNOSIS — I071 Rheumatic tricuspid insufficiency: Secondary | ICD-10-CM | POA: Diagnosis not present

## 2016-05-10 DIAGNOSIS — R058 Other specified cough: Secondary | ICD-10-CM

## 2016-05-10 MED ORDER — AMOXICILLIN-POT CLAVULANATE 875-125 MG PO TABS: 1.0000 | ORAL_TABLET | Freq: Two times a day (BID) | ORAL | Status: DC

## 2016-05-10 NOTE — Progress Notes (Signed)
Quick Note:  Spoke with pt and notified of results per Dr. Wert. Pt verbalized understanding and denied any questions.  ______ 

## 2016-05-13 NOTE — Assessment & Plan Note (Addendum)
Flutter valve added 04/25/2016  - Spirometry 05/08/2016  FEV1 1.49 (81%)  Ratio 76 p no rx and unable to do FENO  - Late entry: Sinus CT 05/10/2016 > Dependent air-fluid level LEFT maxillary sinus.> Augmentin 875 mg take one pill twice daily  X 20 days  >  then repeat sinus ct if not 100% better   Not able to do NO study but spirometry not at all suggestive of asthma and CT sinus very suggestive of UACS from sinusitis/ pnds so will address this fully before pursuing any alternative dx  I had an extended discussion with the patient reviewing all relevant studies completed to date and  lasting 15 to 20 minutes of a 25 minute visit    Each maintenance medication was reviewed in detail including most importantly the difference between maintenance and prns and under what circumstances the prns are to be triggered using an action plan format that is not reflected in the computer generated alphabetically organized AVS.    Please see instructions for details which were reviewed in writing and the patient given a copy highlighting the part that I personally wrote and discussed at today's ov.

## 2016-05-17 ENCOUNTER — Encounter: Payer: Medicare Other | Admitting: Psychology

## 2016-05-22 ENCOUNTER — Ambulatory Visit (INDEPENDENT_AMBULATORY_CARE_PROVIDER_SITE_OTHER): Payer: Medicare Other | Admitting: Internal Medicine

## 2016-05-22 ENCOUNTER — Other Ambulatory Visit (INDEPENDENT_AMBULATORY_CARE_PROVIDER_SITE_OTHER): Payer: Medicare Other

## 2016-05-22 ENCOUNTER — Encounter: Payer: Self-pay | Admitting: Internal Medicine

## 2016-05-22 VITALS — BP 134/84 | HR 85 | Ht 60.0 in | Wt 154.0 lb

## 2016-05-22 DIAGNOSIS — R05 Cough: Secondary | ICD-10-CM

## 2016-05-22 DIAGNOSIS — R058 Other specified cough: Secondary | ICD-10-CM

## 2016-05-22 LAB — CBC WITH DIFFERENTIAL/PLATELET
BASOS ABS: 0 10*3/uL (ref 0.0–0.1)
BASOS PCT: 0.6 % (ref 0.0–3.0)
EOS ABS: 0.4 10*3/uL (ref 0.0–0.7)
Eosinophils Relative: 5.7 % — ABNORMAL HIGH (ref 0.0–5.0)
HEMATOCRIT: 41.1 % (ref 36.0–46.0)
HEMOGLOBIN: 13.8 g/dL (ref 12.0–15.0)
LYMPHS PCT: 26.1 % (ref 12.0–46.0)
Lymphs Abs: 1.7 10*3/uL (ref 0.7–4.0)
MCHC: 33.6 g/dL (ref 30.0–36.0)
MCV: 88 fl (ref 78.0–100.0)
MONOS PCT: 5 % (ref 3.0–12.0)
Monocytes Absolute: 0.3 10*3/uL (ref 0.1–1.0)
Neutro Abs: 4.2 10*3/uL (ref 1.4–7.7)
Neutrophils Relative %: 62.6 % (ref 43.0–77.0)
Platelets: 270 10*3/uL (ref 150.0–400.0)
RBC: 4.68 Mil/uL (ref 3.87–5.11)
RDW: 16.2 % — ABNORMAL HIGH (ref 11.5–15.5)
WBC: 6.7 10*3/uL (ref 4.0–10.5)

## 2016-05-22 NOTE — Patient Instructions (Addendum)
Finish your augmentin   Continue the pepcid at bedtime for another 6 weeks minimum to see if the cough flares and if it flares while still on it that would point to your sinuses and we can refer you to a sinus specialist (ENT)  Please remember to go to the lab  department downstairs for your tests - we will call you with the results when they are available.   If you are satisfied with your treatment plan,  let your doctor know and he/she can either refill your medications or you can return here when your prescription runs out.     If in any way you are not 100% satisfied,  please tell us.  If 100% better, tell your friends!  Pulmonary follow up is as needed

## 2016-05-22 NOTE — Progress Notes (Signed)
Subjective:    Patient ID: Katherine Wang, female    DOB: [birth year]    MRN: [id number]    Brief patient profile:  44 yowf quit smoking in 1985 with no resp problems, able to built up walking to 30 minutes but developed knee pain which severely slowed her down around 2000 dx RA and breathing problems requiring saba then around early June 2017 noted short of breath of worse than usual assoc with loss of voice/ cough referred to pulmonary clinic 04/25/2016 by Dr Loralie Champagne    History of Present Illness  04/25/2016 1st [facility] office visit/ Elijiah Mickley   Chief Complaint  Patient presents with  . Pulmonary Consult    Referred by Dr. Loralie Champagne. Pt c/o cough and SOB x 3 wks. Her cough is prod with white sputum. It bothers her most during the day, but occ wakes her up in the night. She states that she feels SOB "all the time"- worse with exertion. She has also noticed some wheezing.   indolent onset of sob x 3 weeks assoc with mostly dry cough ? Worse on asthmanex powder   For "many months"  has noted "wheezing" at hs prior to onset of her sob daytime but did not disturb sleep pred helped a little but only took 20 mg x 3 days  RA is worse and may need to start injections  (Deveshwar)  rec Prednisone 10 mg x 2 every day until you feel better then 1 daily until return  Pantoprazole (protonix) 40 mg   Take  30-60 min before first meal of the day and Pepcid (famotidine)  20 mg one @  bedtime until return to office  For short of breath > duoneb  Up 4 hours as needed For Cough > flutter valve plus take delsym two tsp every 12 hours and supplement if needed with  tramadol 50 mg up to 2 every 4 hours to suppress the urge to cough.  Once you have eliminated the cough for 3 straight days try reducing the tramadol first,  then the delsym as tolerated.   GERD diet    05/08/2016  f/u ov/Shanee Batch re:  uacs vs asthma - symptom onset early June/2017  - only took rx for gerd for a week, then d/c due to  "gas" Finished prednisone sev days prior to OV  Improved just a little with main concern now the cough   Chief Complaint  Patient presents with  . Follow-up    Cough and hoarseness have improved some. She is also having less SOB. She has not had to use albuterol inhaler.   has not tried to lie flat x months due to cough , symptoms are more sporadic daytime cough > sob Mucus is minimal/ mucoid/ mostly in ams rec schedule sinus CT > pos sinusitis rx augmentin x 20 days Continue Pepcid 20 mg at bedtime until next office visit and go ahead tonight to see if can lie down flat again   If start having more cough or short of breath, first see if the duoneb help up to every 4 hours as needed  For cough > use the flutter valve as much as possible    05/22/2016  f/u ov/Honestee Revard re: cough since early June 2017 with assoc sob > both have resolved  Chief Complaint  Patient presents with  . Follow-up    Cough is much improved.    finishing up augmentin/ no longer limited by breathing from desired activities  But  rather by knees    No obvious day to day or daytime variability or assoc excess/ purulent sputum or mucus plugs or hemoptysis or cp or chest tightness, subjective wheeze or overt sinus or hb symptoms. No unusual exp hx or h/o childhood pna/ asthma or knowledge of premature birth.  Sleeping ok at 30 degree  without nocturnal  or early am exacerbation  of respiratory  c/o's or need for noct saba. Also denies any obvious fluctuation of symptoms with weather or environmental changes or other aggravating or alleviating factors except as outlined above   Current Medications, Allergies, Complete Past Medical History, Past Surgical History, Family History, and Social History were reviewed in [employer] record.  ROS  The following are not active complaints unless bolded sore throat, dysphagia, dental problems, itching, sneezing,  nasal congestion or excess/ purulent secretions, ear  ache,   fever, chills, sweats, unintended wt loss, classically pleuritic or exertional cp,  orthopnea pnd or leg swelling, presyncope, palpitations, abdominal pain, anorexia, nausea, vomiting, diarrhea  or change in bowel or bladder habits, change in stools or urine, dysuria,hematuria,  rash, arthralgias, visual complaints, headache, numbness, weakness or ataxia or problems with walking or coordination,  change in mood/affect or memory.                 Objective:   Physical Exam  amb  wf nad / all smiles    05/22/2016        154  05/08/2016        154   04/25/16 151 lb 6.4 oz (68.675 kg)  04/20/16 150 lb (68.04 kg)  04/18/16 149 lb 9.6 oz (67.858 kg)    Vital signs reviewed   HEENT: top denture/ lower partial , turbinates, and oropharynx. Nl external ear canals without cough reflex   NECK :  without JVD/Nodes/TM/ nl carotid upstrokes bilaterally   LUNGS: no acc muscle use,  Nl contour chest which is clear to A and P bilaterally     CV:  RRR  no s3 or murmur or increase in P2, no edema   ABD:  soft and nontender with nl inspiratory excursion in the supine position. No bruits or organomegaly, bowel sounds nl  MS:  Nl gait/ ext warm without deformities, calf tenderness, cyanosis or clubbing No obvious joint restrictions   SKIN: warm and dry without lesions    NEURO:  alert, approp, nl sensorium with  no motor deficits      I personally reviewed images and agree with radiology impression as follows:  CT Chest   04/16/16 No pulmonary emboli or other active chest disease by CT.       Assessment & Plan:

## 2016-05-23 LAB — RESPIRATORY ALLERGY PROFILE REGION II ~~LOC~~
Allergen, Cedar tree, t12: 0.16 kU/L — ABNORMAL HIGH
Allergen, Comm Silver Birch, t9: <0.1 kU/L
Allergen, D pternoyssinus,d7: <0.1 kU/L
Allergen, Mouse Urine Protein, e78: <0.1 kU/L
Allergen, Mulberry, t76: <0.1 kU/L
Alternaria Alternata: <0.1 kU/L
Aspergillus fumigatus, m3: <0.1 kU/L
Bermuda Grass: <0.1 kU/L
Cat Dander: <0.1 kU/L
Cladosporium Herbarum: <0.1 kU/L
Cockroach: <0.1 kU/L
Common Ragweed: <0.1 kU/L
IGE (IMMUNOGLOBULIN E), SERUM: 105 kU/L (ref <115)
Pecan/Hickory Tree IgE: <0.1 kU/L
Penicillium Notatum: <0.1 kU/L
Rough Pigweed  IgE: <0.1 kU/L
Timothy Grass: <0.1 kU/L

## 2016-05-24 ENCOUNTER — Telehealth: Payer: Self-pay | Admitting: Internal Medicine

## 2016-05-24 NOTE — Progress Notes (Signed)
LMTCB

## 2016-05-24 NOTE — Telephone Encounter (Signed)
Tanda Rockers, MD sent to Rosana Berger, CMA        Call patient : Studies are c/w mild allergies to cedar trees only so I don't favor allergy consultation at this point but might revisit in future    Spoke with pt and notified of results per Dr. Melvyn Novas. Pt verbalized understanding and denied any questions.

## 2016-05-24 NOTE — Progress Notes (Signed)
Spoke with pt and notified of results per Dr. Wert. Pt verbalized understanding and denied any questions. 

## 2016-05-31 ENCOUNTER — Ambulatory Visit (INDEPENDENT_AMBULATORY_CARE_PROVIDER_SITE_OTHER): Payer: Medicare Other | Admitting: Psychology

## 2016-05-31 DIAGNOSIS — R413 Other amnesia: Secondary | ICD-10-CM

## 2016-05-31 NOTE — Assessment & Plan Note (Signed)
Flutter valve added 04/25/2016  - Spirometry 05/08/2016  FEV1 1.49 (81%)  Ratio 76 p no rx and unable to do FENO  - Sinus CT 05/10/2016 > Dependent air-fluid level LEFT maxillary sinus.> Augmentin 875 mg take one pill twice daily  X 20 days  >  then repeat sinus ct if not 100% better  - Allergy profile 05/22/2016 >  Eos 0.4 /  IgE  105 mild Pos for cedar tree RAST  Clearly much better but warned may flare again and so rec maintain on GERD rx x 6 weeks and then stop. If flares during active gerd rx next step is sinus ct  I had an extended final summary discussion with the patient reviewing all relevant studies completed to date and  lasting 15 to 20 minutes of a 25 minute visit on the following issues:    Each maintenance medication was reviewed in detail including most importantly the difference between maintenance and as needed and under what circumstances the prns are to be used.  Please see instructions for details which were reviewed in writing and the patient given a copy.

## 2016-05-31 NOTE — Progress Notes (Signed)
   Neuropsychology Note  Katherine Wang returned today for 2 hours of neuropsychological testing with technician, Milana Kidney, BS, under the supervision of Dr. Macarthur Critchley. The patient did not appear overtly distressed by the testing session, per behavioral observation or via self-report to the technician. Rest breaks were offered. Katherine Wang will return within 2 weeks for a feedback session with Dr. Si Raider at which time her test performances, clinical impressions and treatment recommendations will be reviewed in detail. The patient understands she can contact our office should she require our assistance before this time.  Full report to follow.

## 2016-06-04 ENCOUNTER — Other Ambulatory Visit: Payer: Self-pay | Admitting: Cardiology

## 2016-06-11 NOTE — Progress Notes (Signed)
NEUROPSYCHOLOGICAL EVALUATION   Name:    Katherine Wang  Date of Birth:   Oct 28, 1946 Date of Interview:  04/12/2016 Date of Testing:  05/31/2016  Date of Feedback:  06/12/2016     Background Information:  Reason for Referral:  Katherine Wang is a 70 y.o. female referred by Dr. Wells Guiles Tat to assess her current level of cognitive functioning and assist in differential diagnosis. Katherine current evaluation consisted of a review of available medical records, an interview with Katherine Wang, and Katherine completion of a neuropsychological testing battery. Informed consent was obtained.  History of Presenting Problem:  Katherine Wang reported recent onset (six months ago or less) of cognitive changes including reduced working memory and increased word finding difficulty. She reported word substitution errors (e.g., "white" instead of "vanilla"). She denied any other cognitive symptoms, including changes in episodic memory, visual-spatial skills or attention. She does believe her language symptoms may have improved somewhat since her last visit with Dr. Carles Collet on 03/19/2016. She reported that she was relieved to hear that her recent brain MRI was essentially normal.    Katherine Wang reported that she suffered a mild stroke 20-30 years ago. She reported that while driving, her left arm went numb but "slowly Katherine feeling crept back and". She saw a neurologist who told her that it had been a mild stroke with minimal residual effects. She is unsure if she had neuro imaging at that time.   Katherine Wang also has a history of depression for Katherine past 50 years as well as generalized anxiety. She previously saw a therapist in [place], [place] but she has not been in therapy for at least 6 years. She has been taking Zoloft for many years. She also takes Klonopin at night before bed. She denied suicidal ideation or intention. Family history is reportedly significant for depression in multiple family members including her mother who  was psychiatrically hospitalized several times for psychotic episodes.   Katherine Wang reported significant psychosocial stress at Katherine present time due to tension relationships with her daughters and son-in-law. She also reported that she worries about her grandchildren, including one grandson who has autism. Katherine Wang reported that she misses working very much. She retired a few years ago but is not sure that she should have. Katherine Wang lives alone and does not socialize very often. She stated, "I am not a joiner." She reported having one good friend.   Katherine Wang lives alone in an apartment which she rents from her daughter and son-in-law, a source of conflict in her life. She continues to manage all instrumental ADLs including driving, medications, finances, cooking, and appointments, without any reported difficulty.   Physically, Katherine Wang complained of low energy and fatigue. She has had sleep difficulties in Katherine past but reported her sleep has improved recently. Katherine Wang drinks minimal alcohol (2 times a year). She is a former heavy smoker (ages [age]). She denied history of substance abuse or dependence.   Social History: Born/Raised: [employer]. Katherine Wang reported that she was largely raised by her great aunt. Education/occupation: Katherine Wang obtained a GED and later went to college as an adult. She finished a Masters degree in psychology in her early 48s. 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 She worked for Katherine [employer] and in her own [employer]. Marital history: Divorced. Katherine Wang had 3 children. She lost her son when he was 59 years old due  to an aneurysm. She has 2 daughters who live in [place] and [place] and 3 grandsons.  Medical History:  Past Medical History:  Diagnosis Date  . Adenomatous colon polyp 2012  . Allergy   . Anxiety   . Aortic insufficiency   . Arthritis   . Basal cell carcinoma   .  Cataract   . Depression   . Diverticulosis   . Fibromyalgia   . Fibromyalgia   . Hyperlipidemia   . Hypertension   . Macular degeneration   . Melanoma ([place])   . Murmur, heart   . Reflux   . Stroke ([place])    per pt. mild stroke  . Thyroid disease     Current medications:  Outpatient Encounter Prescriptions as of 06/12/2016  Medication Sig  . albuterol (PROVENTIL HFA;VENTOLIN HFA) 108 (90 Base) MCG/ACT inhaler Inhale 2 puffs into Katherine lungs every 6 (six) hours as needed for wheezing or shortness of breath.  Marland Kitchen amoxicillin-clavulanate (AUGMENTIN) 875-125 MG tablet Take 1 tablet by mouth 2 (two) times daily.  Marland Kitchen aspirin EC 81 MG tablet Take 1 tablet (81 mg total) by mouth daily.  Marland Kitchen atorvastatin (LIPITOR) 40 MG tablet TAKE 1 TABLET BY MOUTH EVERY DAY  . clonazePAM (KLONOPIN) 0.5 MG tablet TAKE 1 TABLET BY MOUTH TWICE DAILY AS NEEDED  . Cyanocobalamin (VITAMIN B 12 PO) Take 1 tablet by mouth daily.  . cycloSPORINE (RESTASIS) 0.05 % ophthalmic emulsion 1 drop 2 (two) times daily.  . famotidine (PEPCID) 20 MG tablet One at bedtime  . fluticasone (FLONASE) 50 MCG/ACT nasal spray INSTILL 2 SPRAYS IN EACH NOSTRIL EVERY DAY  . folic acid (FOLVITE) 1 MG tablet Take 2 tablets by mouth daily.  Marland Kitchen ipratropium-albuterol (DUONEB) 0.5-2.5 (3) MG/3ML SOLN Take 3 mLs by nebulization every 4 (four) hours as needed. Use every 4-6 hours  . methotrexate (RHEUMATREX) 2.5 MG tablet Take 2.5 mg by mouth once a week. Caution:Chemotherapy. Protect from light.  . Multiple Vitamins-Minerals (PRESERVISION AREDS PO) Take by mouth. Reported on 02/15/2016  . naproxen sodium (ANAPROX) 220 MG tablet Take 220 mg by mouth as needed.    Marland Kitchen Respiratory Therapy Supplies (FLUTTER) DEVI Use as directed  . sertraline (ZOLOFT) 100 MG tablet Take 2 tablets (200 mg total) by mouth daily.  Marland Kitchen SYNTHROID 100 MCG tablet TAKE 1 TABLET BY MOUTH DAILY BEFORE BREAKFAST  . valsartan (DIOVAN) 160 MG tablet Take 1 tablet (160 mg total) by mouth 2  (two) times daily.   No facility-administered encounter medications on file as of 06/12/2016.      Current Examination:  Behavioral Observations:  Appearance: Neatly dressed and groomed Gait: Ambulated independently, no abnormalities observed Speech: Fluent; normal rate, rhythm and volume; mild word finding difficulty Thought process: Linear, goal directed Affect: Stable, within normal limits, euthymic Interpersonal: Pleasant, appropriate Orientation: Oriented to all spheres  Tests Administered: . Test of Premorbid Functioning (TOPF) . Wechsler Adult Intelligence Scale-Fourth Edition (WAIS-IV): Similarities, Matrix Reasoning, Arithmetic, Symbol Search, Coding and Digit Span subtests . Wechsler Memory Scale-Fourth Edition (WMS-IV) Older Adult Version (ages 1-90): Logical Memory I, II and Recognition subtests  . [employer] Verbal Learning Test - 2nd Edition (CVLT-2) Short Form . Repeatable Battery for Katherine Assessment of Neuropsychological Status (RBANS) Form A:  Figure Copy and Recall subtests . Neuropsychological Assessment Battery (NAB) Language Module, Form 1: Naming Subtests . Boston Diagnostic Aphasia Examination: Repetition of Words, Repetition of Phrases, Responsive Naming subtests . Controlled Oral Word Association Test (COWAT) . Trail Making Test A  and B . Clock drawing test . Geriatric Depression Scale (GDS) 15 Item . Generalized Anxiety Disorder - 7 item screener (GAD-7) . Beck Depression Inventory - Second edition (BDI-II)  Test Results: Note: Standardized scores are presented only for use by appropriately trained professionals and to allow for any future test-retest comparison. These scores should not be interpreted without consideration of all Katherine information that is contained in Katherine rest of Katherine report. Katherine most recent standardization samples from Katherine test publisher or other sources were used whenever possible to derive standard scores; scores were corrected for age,  gender, ethnicity and education when available.   Test Scores:   Test Name Standardized Score Descriptor  TOPF SS= 121 Superior  WAIS-IV Subtests    Similarities ss= 15 Superior  Matrix Reasoning ss= 14 Superior  Arithmetic ss= 8 Average  Symbol Search ss= 12 High average  Coding ss= 14 Superior  Digit Span  ss= 12 High average  WMS-IV Subtests    LM I ss= 14 Superior  LM II ss= 14 Superior  LM II recognition Cumulative percentage: >75   RBANS Subtests    Figure Copy Z= -0.4 Average  Figure Recall Z= 0.6 Average  CVLT-II Scores    Trial 1 Z= -0.5 Average  Trial 4 Z= 0 Average  Trials 1-4 total T=57 High average  SD Free Recall Z= 2 Very superior  LD Free Recall Z= 1 High average  LD Cued Recall Z= 0.5 Average  Recognition Discriminability (9/9 hits, 0 false positives) Z= 0.5 Average  NAB Naming T= 58 High average  BDAE subtests    Repetition of Words 10/10   Repetition of Phrases 5/5; 5/5   Responsive Naming 10/10   COWAT-FAS T= 53 Average  COWAT-Animals T= 57 High average  Trail Making Test A 0 errors T= 59 High average  Trail Making Test B 3 errors T= 47 Average  Clock Drawing  WNL  GDS-15 9/15 Moderate   GAD-7 13/21 Severe   BDI-II 31 Severe                Description of Test Results:  Premorbid verbal intellectual abilities were estimated to have been within Katherine superior range based on a test of word reading. Psychomotor processing speed was high average to superior. Auditory attention and working memory were average to high average. Visual-spatial construction was average. Language abilities were intact. Specifically, confrontation naming was high average, and responsive naming was intact. Semantic verbal fluency was high average. Repetition of words and phrases was intact. With regard to verbal memory, encoding and acquisition of non-contextual information (i.e., word list) was high average across four learning trials. After a brief distracter task,  free recall was very superior. After a delay, free recall was high average. Performance on a yes/no recognition task was intact with 100% accuracy. On another verbal memory test, encoding and acquisition of contextual auditory information (i.e., short stories) was superior. After a delay, free recall was superior. Performance on yes/no recognition tasks was above average. With regard to non-verbal memory, delayed free recall of visual information was average. Executive functioning was intact overall. Mental flexibility and set-shifting were average on Trails B; she did commit three set loss errors on this task but was able to recover from them relatively quickly. Verbal fluency with phonemic search restrictions was average. Both verbal and non-verbal abstract reasoning were superior. Performance on a clock drawing task was intact. On self-report questionnaires, Katherine Wang's responses were  indicative of clinically significant depression  and anxiety at Katherine present time. On Katherine BDI-II, she endorsed mild to moderate sadness, pessimism, feelings of failure, guilty feelings, self-disappointment, restlessness/agitation, indecisiveness, feelings of worthlessness, loss of energy, irritability, increased appetite, fatigue and reduced libido. She reported severe loss of interest and sleep difficulty characterized by waking up 1-2 hours early with inability to return to sleep. She endorsed passive suicidal ideation but denied intention or plan. On Katherine GAD-7, she endorsed daily nervousness/anxiety along with frequent inability to control worrying, worrying too much about different things, difficulty relaxing, and irritability.    Clinical Impressions: Major Depressive Disorder, moderate to severe; Generalized anxiety disorder. No cognitive disorder. Results of cognitive testing were entirely within normal limits. There were no areas of impairment, and all performances were at least average, with many scores in Katherine high  average to superior range. Meanwhile, there is evidence of significant depression and anxiety. As such, Katherine Wang's subjective cognitive complaints are most likely secondary to depression and psychosocial stress. There is no evidence of an underlying dementia or cognitive disorder at this time. I am hopeful that more aggressive treatment of her depression and anxiety will result in improved quality of life as well as enhanced cognitive function in daily life.   Recommendations/Plan: Based on Katherine findings of Katherine present evaluation, Katherine following recommendations are offered:  1. Treatment for depression/anxiety: Katherine Wang is currently taking Zoloft and Klonopin. She may wish to see a psychiatrist to determine if changes need to be made. I highly recommend that she seek psychotherapy (which she has done in Katherine past) in combination with her psychopharmacological intervention. She accepted a referral to [facility] and I will place this order for her.   2. Katherine Wang was reassured that her cognitive test results were entirely within normal limits and not indicative of a cognitive disorder or dementia at this time. These test results will serve as a nice baseline for future comparison if needed at any time.   Feedback to Wang: Katherine Wang and her daughter returned for a feedback appointment on 06/12/2016 to review Katherine results of her neuropsychological evaluation with this provider. 40 minutes face-to-face time was spent reviewing her test results, my impressions and my recommendations as detailed above.    Total time spent on this Wang's case: [id number] unit for interview with psychologist; [phone] units of testing by psychometrician under psychologist's supervision; [phone] units for medical record review, scoring of neuropsychological tests, interpretation of test results, preparation of this report, and review of results to Katherine Wang by psychologist.      Thank you for your  referral of Katherine Wang. Please feel free to contact me if you have any questions or concerns regarding this report.

## 2016-06-12 ENCOUNTER — Ambulatory Visit (INDEPENDENT_AMBULATORY_CARE_PROVIDER_SITE_OTHER): Payer: Medicare Other | Admitting: Psychology

## 2016-06-12 DIAGNOSIS — R413 Other amnesia: Secondary | ICD-10-CM | POA: Diagnosis not present

## 2016-06-12 DIAGNOSIS — F32A Depression, unspecified: Secondary | ICD-10-CM

## 2016-06-12 DIAGNOSIS — F329 Major depressive disorder, single episode, unspecified: Secondary | ICD-10-CM

## 2016-06-12 NOTE — Patient Instructions (Signed)
Clinical Impressions: Major Depressive Disorder, moderate to severe; Generalized anxiety disorder. No cognitive disorder. Results of cognitive testing were entirely within normal limits. There were no areas of impairment, and all performances were at least average, with many scores in the high average to superior range. Meanwhile, there is evidence of significant depression and anxiety. As such, the patient's subjective cognitive complaints are most likely secondary to depression and psychosocial stress. There is no evidence of an underlying dementia or cognitive disorder at this time. I am hopeful that more aggressive treatment of her depression and anxiety will result in improved quality of life as well as enhanced cognitive function in daily life.   Recommendations/Plan: Based on the findings of the present evaluation, the following recommendations are offered:  1. Treatment for depression/anxiety: The patient is currently taking Zoloft and Klonopin. She may wish to see a psychiatrist to determine if changes need to be made. I highly recommend that she seek psychotherapy (which she has done in the past) in combination with her psychopharmacological intervention. I will gladly refer her to [facility] if she is amenable.   2. The patient will be reassured that her cognitive test results were entirely within normal limits and not indicative of a cognitive disorder or dementia at this time. These test results will serve as a nice baseline for future comparison if needed at any time.

## 2016-06-25 ENCOUNTER — Institutional Professional Consult (permissible substitution): Payer: Medicare Other | Admitting: Pulmonary Disease

## 2016-06-28 ENCOUNTER — Telehealth: Payer: Self-pay | Admitting: Family Medicine

## 2016-06-28 NOTE — Telephone Encounter (Signed)
[place] a sore throat and some mucus. Pt would like to know if PCP could [place] her [place]?     [facility] - [address], [place], [place]: [phone]

## 2016-06-28 NOTE — Telephone Encounter (Signed)
Called her back- no answer.

## 2016-06-28 NOTE — Telephone Encounter (Signed)
She notes a ST for 2-3 days and sinus mucus/. drainage.   She does not think that she has had a fever.  No cough, no nausea or vomiting Advised her that at this time I think she likely has a viral infection and I would not advise abx. However if she is getting worse certainly contact me, the on call provider or be seen at a local urgent care center

## 2016-07-04 DIAGNOSIS — Z85828 Personal history of other malignant neoplasm of skin: Secondary | ICD-10-CM | POA: Diagnosis not present

## 2016-07-04 DIAGNOSIS — Z85820 Personal history of malignant melanoma of skin: Secondary | ICD-10-CM | POA: Diagnosis not present

## 2016-07-04 DIAGNOSIS — L84 Corns and callosities: Secondary | ICD-10-CM | POA: Diagnosis not present

## 2016-07-04 DIAGNOSIS — L821 Other seborrheic keratosis: Secondary | ICD-10-CM | POA: Diagnosis not present

## 2016-07-04 DIAGNOSIS — L720 Epidermal cyst: Secondary | ICD-10-CM | POA: Diagnosis not present

## 2016-07-10 ENCOUNTER — Other Ambulatory Visit: Payer: Self-pay | Admitting: Family Medicine

## 2016-07-11 ENCOUNTER — Other Ambulatory Visit: Payer: Self-pay | Admitting: Emergency Medicine

## 2016-07-11 MED ORDER — FLUTICASONE PROPIONATE 50 MCG/ACT NA SUSP: NASAL | 1 refills | Status: DC

## 2016-07-20 ENCOUNTER — Ambulatory Visit (INDEPENDENT_AMBULATORY_CARE_PROVIDER_SITE_OTHER): Payer: Medicare Other | Admitting: Physician Assistant

## 2016-07-20 ENCOUNTER — Telehealth: Payer: Self-pay | Admitting: Cardiology

## 2016-07-20 ENCOUNTER — Encounter: Payer: Self-pay | Admitting: Physician Assistant

## 2016-07-20 VITALS — BP 142/82 | HR 85 | Ht 60.0 in | Wt 153.0 lb

## 2016-07-20 DIAGNOSIS — I351 Nonrheumatic aortic (valve) insufficiency: Secondary | ICD-10-CM

## 2016-07-20 DIAGNOSIS — R072 Precordial pain: Secondary | ICD-10-CM

## 2016-07-20 DIAGNOSIS — R059 Cough, unspecified: Secondary | ICD-10-CM

## 2016-07-20 DIAGNOSIS — J329 Chronic sinusitis, unspecified: Secondary | ICD-10-CM

## 2016-07-20 DIAGNOSIS — I7121 Aneurysm of the ascending aorta, without rupture: Secondary | ICD-10-CM

## 2016-07-20 DIAGNOSIS — I309 Acute pericarditis, unspecified: Secondary | ICD-10-CM

## 2016-07-20 DIAGNOSIS — R05 Cough: Secondary | ICD-10-CM

## 2016-07-20 DIAGNOSIS — I712 Thoracic aortic aneurysm, without rupture: Secondary | ICD-10-CM

## 2016-07-20 LAB — CBC
HEMATOCRIT: 38.9 % (ref 35.0–45.0)
Hemoglobin: 13.1 g/dL (ref 11.7–15.5)
MCH: 29.4 pg (ref 27.0–33.0)
MCHC: 33.7 g/dL (ref 32.0–36.0)
MCV: 87.2 fL (ref 80.0–100.0)
MPV: 9.9 fL (ref 7.5–12.5)
Platelets: 281 10*3/uL (ref 140–400)
RBC: 4.46 MIL/uL (ref 3.80–5.10)
RDW: 15.6 % — AB (ref 11.0–15.0)
WBC: 8.3 10*3/uL (ref 3.8–10.8)

## 2016-07-20 MED ORDER — COLCHICINE 0.6 MG PO TABS: 0.6000 mg | ORAL_TABLET | Freq: Two times a day (BID) | ORAL | 2 refills | Status: DC

## 2016-07-20 MED ORDER — IBUPROFEN 600 MG PO TABS: ORAL_TABLET | ORAL | 0 refills | Status: DC

## 2016-07-20 NOTE — Patient Instructions (Addendum)
Medication Instructions:   START COLCHICINE 0.6 MG ONE TABLET TWICE DAILY X 3 MONTHS  START IBUPROFEN 600 MG ONE TABLET THREE TIMES DAILY FOR 3 WEEKS  Your physician recommends that you HAVE LAB WORK TODAY  A chest x-ray takes a picture of the organs and structures inside the chest, including the heart, lungs, and blood vessels. This test can show several things, including, whether the heart is enlarges; whether fluid is building up in the lungs; and whether pacemaker / defibrillator leads are still in place. AT [facility]   Follow-Up:  Your physician recommends that you schedule a follow-up appointment in: [place]

## 2016-07-20 NOTE — Telephone Encounter (Signed)
Per Dr. Aundra Dubin will refer to Dr. Janace Hoard for sinuses.  Notified pt that someone will call you with appointment.

## 2016-07-20 NOTE — Telephone Encounter (Signed)
Calling stating she has been having pain in her chest x 1 wk.  States pain is  worse when she lies down, is constant and is mainly on (R) side.  Also having SOB and running low grade fever (99.5-normal for her is 97).  Also states when she sits up and takes a deep breath has pain that goes across chest and up into neck.  She also states she would like Dr. Aundra Dubin to refer her to an ENT physician for her sinuses.  Advised will forward message to him.  Made her an appointment for today with Desiree Hane at the [place] office.  She is agreeable with this and will keep appointment.

## 2016-07-20 NOTE — Telephone Encounter (Signed)
New Message  Pt c/o of Chest Pain: STAT if CP now or developed within 24 hours  1. Are you having CP right now? Per not not right now because she is sitting up   2. Are you experiencing any other symptoms (ex. SOB, nausea, vomiting, sweating)? Some sob when sitting up and taking deep breathe   3. How long have you been experiencing CP? Per pt has beeing dealing with the pain for about a week. Pt states she uses an elevated pillow.. Aching in left arm   4. Is your CP continuous or coming and going? Per pt when lying down on back or side it gets worse   5. Have you taken Nitroglycerin? NO ? pt comments: pt wants to know if the pain could be caused from her heart condition. Please call back to discuss

## 2016-07-20 NOTE — Telephone Encounter (Signed)
She needs to be seen in office, probably needs CXR.  Can see Dr Janace Hoard for ENT.

## 2016-07-20 NOTE — Progress Notes (Signed)
Cardiology Office Note    Date:  07/20/2016   ID:  CLEOPHA GEETER, Alferd Apa 1945/12/30, MRN [id number]  PCP:  Lamar Blinks, MD  Cardiologist:  Previously Dr. Aundra Dubin, will need to establish with another cardiologist since Dr. Aundra Dubin is transitioning from general cardiology to heart failure.  Chief Complaint  Patient presents with  . Follow-up    seen for Dr. Aundra Dubin    History of Present Illness:  Katherine Wang is a 70 y.o. female with past medical history of rheumatoid arthritis, aortic insufficiency, mild ascending aortic dilatation, and suspected prior acute pericarditis. Her cardiac history began in 2004 when she had possible acute pericarditis in [place]. Left heart cath at that time showed no significant disease. She has been followed for a malformed (though trileaflet) aortic valve. She has had aortic insufficiency and mildly dilated ascending aorta. She moved to [place] in 2012 to help her daughter who is austistic, she is back to [place] now. She had a stress test in 2015 in [place]. She think it is normal. Last echocardiogram in February 2016 showed EF 50-55%, moderate AI, 4 cm ascending aorta. CTA of the chest obtained in June 2017 showed 3.9 cm descending aorta. Her last cardiology follow-up was in June 2017 At which time she was doing well, her valsartan was increased to 160 mg twice a day and she was started on daily aspirin. A repeat echocardiogram was obtained on 05/10/2016 which shows EF 65-70%, no pericardial effusion, grade 1 diastolic dysfunction, moderate AR.  For the past few weeks, she has been having substernal chest discomfort which she described as a sharp pain. She notices it more when she lays down at night. Interestingly, she also notices more on the right side where she leaning on the right side. She says her chest is sore on touch on the surface however she also has a deep chest discomfort in the same location that on affected by palpation. She  notices the last when she is sitting up and leaning forward. She has a very mild cough recently. She says she has been previously referred to Dr. Melvyn Novas for treatment of upper respiratory illness. She is scheduled to see a sinus specialist soon. EKG obtained office is abnormal showing inferolateral downsloping ST segment with T wave inversion, however when compared to the previous EKG in January 2016, this is unchanged. I have reviewed the EKG and her history with Dr. Ellyn Hack, office DOD. Her current chest pain is very atypical for angina, however is more likely pericarditis. This has been going on for several weeks. Her chest discomfort is also worse upon deep inspiration.   PMH: 1. Acute pericarditis in 2004: Hospitalized in [place].  Had LHC at that time that she reports as showing no significant CAD.  Possible recurrent pericarditis 6/12.  2. 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  Last echo (4/10) showed EF 55-60%, mild LV dilation, mild LV hypertrophy, mild aortic insufficiency, mild MR.  Cardiac MRI (4/10) confirmed that the aortic valve was trileaflet but malformed, EF 64%.  - Echo (6/12): EF 50%, moderate (grade II) diastolic dysfunction, mild AI, mild MR, no pericardial effusion.   - Echo (2/16): EF 50-55%, moderate AI, ascending aorta 4.0 cm, mild MR.  3.  Ascending aorta dilation: MRA chest (4/10) with 4 x 3.6 cm ascending aorta.  CTA chest (7/11) with 3.7 x 3.7 cm ascending aorta and aberrant right subclavian course.  - CTA chest 6/17 with 3.9 cm ascending  aorta.  4.  HTN 5.  Rheumatoid arthritis  6.  H/o rheumatic fever in childhood.  7. Hyperlipidemia 8. Aberrant course right subclavian artery   Past Medical History:  Diagnosis Date  . Adenomatous colon polyp 2012  . Allergy   . Anxiety   . Aortic insufficiency   . Arthritis   . Basal cell carcinoma   . Cataract   . Depression   . Diverticulosis   . Fibromyalgia   . Fibromyalgia   .  Hyperlipidemia   . Hypertension   . Macular degeneration   . Melanoma ([place])   . Murmur, heart   . Reflux   . Stroke ([place])    per pt. mild stroke  . Thyroid disease     Past Surgical History:  Procedure Laterality Date  . ABDOMINAL HYSTERECTOMY    . APPENDECTOMY    . BASAL CELL CARCINOMA EXCISION    . CARDIAC CATHETERIZATION    . CHOLECYSTECTOMY    . COLONOSCOPY     30 + years ago, unsure of where  . EYE SURGERY    . LASIK Bilateral   . MELANOMA EXCISION    . NASAL SEPTUM SURGERY    . TUBAL LIGATION      Current Medications: Outpatient Medications Prior to Visit  Medication Sig Dispense Refill  . albuterol (PROVENTIL HFA;VENTOLIN HFA) 108 (90 Base) MCG/ACT inhaler Inhale 2 puffs into the lungs every 6 (six) hours as needed for wheezing or shortness of breath. 18 g 2  . aspirin EC 81 MG tablet Take 1 tablet (81 mg total) by mouth daily. 90 tablet 3  . atorvastatin (LIPITOR) 40 MG tablet TAKE 1 TABLET BY MOUTH EVERY DAY 30 tablet 10  . clonazePAM (KLONOPIN) 0.5 MG tablet TAKE 1 TABLET BY MOUTH TWICE DAILY AS NEEDED 60 tablet 1  . Cyanocobalamin (VITAMIN B 12 PO) Take 1 tablet by mouth daily.    . cycloSPORINE (RESTASIS) 0.05 % ophthalmic emulsion 1 drop 2 (two) times daily.    . famotidine (PEPCID) 20 MG tablet One at bedtime 30 tablet 2  . fluticasone (FLONASE) 50 MCG/ACT nasal spray INSTILL 2 SPRAYS IN EACH NOSTRIL EVERY DAY 16 g 1  . folic acid (FOLVITE) 1 MG tablet Take 2 tablets by mouth daily.    . methotrexate (RHEUMATREX) 2.5 MG tablet Take 2.5 mg by mouth once a week. Caution:Chemotherapy. Protect from light.    . Multiple Vitamins-Minerals (PRESERVISION AREDS PO) Take by mouth. Reported on 02/15/2016    . naproxen sodium (ANAPROX) 220 MG tablet Take 220 mg by mouth as needed.      Marland Kitchen Respiratory Therapy Supplies (FLUTTER) DEVI Use as directed 1 each 0  . sertraline (ZOLOFT) 100 MG tablet Take 2 tablets (200 mg total) by mouth daily. 180 tablet 2  . SYNTHROID 100 MCG  tablet TAKE 1 TABLET BY MOUTH DAILY BEFORE BREAKFAST 90 tablet 3  . valsartan (DIOVAN) 160 MG tablet Take 1 tablet (160 mg total) by mouth 2 (two) times daily. 60 tablet 6  . amoxicillin-clavulanate (AUGMENTIN) 875-125 MG tablet Take 1 tablet by mouth 2 (two) times daily. 40 tablet 0  . ipratropium-albuterol (DUONEB) 0.5-2.5 (3) MG/3ML SOLN Take 3 mLs by nebulization every 4 (four) hours as needed. Use every 4-6 hours 360 mL 2   No facility-administered medications prior to visit.      Allergies:   Tramadol   Social History   Social History  . Marital status: Divorced    Spouse name: N/A  .  Number of children: 3  . Years of education: N/A   Occupational History  . retired    Social History Main Topics  . Smoking status: Former Smoker    Packs/day: 2.00    Years: 20.00    Types: Cigarettes    Quit date: 10/30/1983  . Smokeless tobacco: Never Used  . Alcohol use No  . Drug use: No  . Sexual activity: Not Asked   Other Topics Concern  . None   Social History Narrative   Lives alone in a one story home.  Has 2 living children.  Retired Engineer, water.       Family History:  The patient's family history includes Allergies in her father, mother, and sister; Asthma in her sister; Diabetes in her sister; Emphysema in her father; Heart disease in her father and mother; Hyperlipidemia in her sister; Hypertension in her father, mother, sister, and sister; Mental illness in her father and mother.   ROS:   Please see the history of present illness.    ROS All other systems reviewed and are negative.   PHYSICAL EXAM:   VS:  BP (!) 142/82   Pulse 85   Ht 5' (1.524 m)   Wt 153 lb (69.4 kg)   BMI 29.88 kg/m    GEN: Well nourished, well developed, in no acute distress  HEENT: normal  Neck: no JVD, carotid bruits, or masses Cardiac: RRR; no murmurs, rubs, or gallops,no edema  Respiratory:  clear to auscultation bilaterally, normal work of breathing GI: soft, nontender,  nondistended, + BS MS: no deformity or atrophy  Skin: warm and dry, no rash Neuro:  Alert and Oriented x 3, Strength and sensation are intact Psych: euthymic mood, full affect  Wt Readings from Last 3 Encounters:  07/20/16 153 lb (69.4 kg)  05/22/16 154 lb (69.9 kg)  05/08/16 154 lb (69.9 kg)      Studies/Labs Reviewed:   EKG:  EKG is ordered today.  The ekg ordered today demonstrates T-wave inversion in inferolateral leads with minimal ST downsloping, when compared to the previous EKG in January 2016, there is no change.  Recent Labs: 03/19/2016: ALT 29; TSH 0.70 04/20/2016: Brain Natriuretic Peptide 19.4 05/04/2016: BUN 13; Creat 0.79; Potassium 4.0; Sodium 138 07/20/2016: Hemoglobin 13.1; Platelets 281   Lipid Panel    Component Value Date/Time   CHOL 147 04/20/2016 1548   TRIG 91 04/20/2016 1548   HDL 52 04/20/2016 1548   CHOLHDL 2.8 04/20/2016 1548   VLDL 18 04/20/2016 1548   LDLCALC 77 04/20/2016 1548    Additional studies/ records that were reviewed today include:   Echo 05/10/2016 LV EF: 65% -   70%  ------------------------------------------------------------------- Study Conclusions  - Left ventricle: The cavity size was normal. There was mild focal   basal hypertrophy of the septum. Systolic function was vigorous.   The estimated ejection fraction was in the range of 65% to 70%.   Wall motion was normal; there were no regional wall motion   abnormalities. Doppler parameters are consistent with abnormal   left ventricular relaxation (grade 1 diastolic dysfunction).   There was no evidence of elevated ventricular filling pressure by   Doppler parameters. - Aortic valve: There was moderate regurgitation. - Aortic root: The aortic root was normal in size. - Ascending aorta: The ascending aorta was normal in size. - Left atrium: The atrium was normal in size. - Right ventricle: Systolic function was normal. - Right atrium: The atrium was normal in size. -  Tricuspid valve: There was trivial regurgitation. - Pulmonary arteries: Systolic pressure was within the normal   range. - Inferior vena cava: The vessel was normal in size. - Pericardium, extracardiac: There was no pericardial effusion.  Impressions:  - There is no significant difference from the study on 12/07/2014   ASSESSMENT:    1. Precordial pain   2. Cough   3. Acute pericarditis   4. Moderate aortic regurgitation   5. Ascending aortic aneurysm (HCC)      PLAN:  In order of problems listed above:  1. Acute pericarditis: Her chest discomfort is more noticeable when laying down, and the improve with sitting up. It is worse with deep inspiration body rotation and palpation. Differential diagnosis including acute pericarditis versus musculoskeletal issue. Possibility for ACS very low given the atypical nature of her symptom. Although her baseline EKG is abnormal, this has not changed when compared to the previous EKG in 2016. I have reviewed the EKG with the DOD Dr. Ellyn Hack. Sinces he just had a normal echocardiogram 2 months ago, I will hold off on repeating a limited echo at this time. We will treat the patient with 600 mg 3 times a day of ibuprofen for 3 weeks and 3 month course of colchicine for presumed pericarditis. I will also obtain a CBC and 2 view chest x-ray as well to rule out possible pulmonary etiology especially since the patient says she is coughing. Note, It is not unusual to see elevated white blood cell count in acute pericarditis phase, therefore will correlated with 2 view chest x-ray result. I have also asked to her to see her PCP if her cough become worse or she started having fever. She has no leg pain, no ipsilateral swelling of the legs, suspicion for PE very low. Negative CTA of the chest in June 2017.  2. Moderate AI: Stable on recent echocardiogram obtained in July 2017.  3. Ascending aortic dilatation: Maximal diameter 3.9 cm on recent CT angiogram of the  chest in June 2017.    Medication Adjustments/Labs and Tests Ordered: Current medicines are reviewed at length with the patient today.  Concerns regarding medicines are outlined above.  Medication changes, Labs and Tests ordered today are listed in the Patient Instructions below. Patient Instructions  Medication Instructions:   START COLCHICINE 0.6 MG ONE TABLET TWICE DAILY X 3 MONTHS  START IBUPROFEN 600 MG ONE TABLET THREE TIMES DAILY FOR 3 WEEKS  Your physician recommends that you HAVE LAB WORK TODAY  A chest x-ray takes a picture of the organs and structures inside the chest, including the heart, lungs, and blood vessels. This test can show several things, including, whether the heart is enlarges; whether fluid is building up in the lungs; and whether pacemaker / defibrillator leads are still in place. AT [facility]   Follow-Up:  Your physician recommends that you schedule a follow-up appointment in: [place]         Signed, Almyra Deforest, [place]  07/20/2016 9:52 PM    [facility] [place], [place], [place]  [address] Phone: [phone]; Fax: [phone]

## 2016-07-24 ENCOUNTER — Ambulatory Visit (INDEPENDENT_AMBULATORY_CARE_PROVIDER_SITE_OTHER): Payer: Medicare Other | Admitting: Psychiatry

## 2016-07-24 ENCOUNTER — Ambulatory Visit (HOSPITAL_COMMUNITY)
Admission: RE | Admit: 2016-07-24 | Discharge: 2016-07-24 | Disposition: A | Discharge status: Home / Self Care | Payer: Medicare Other | Source: Ambulatory Visit | Attending: Physician Assistant | Admitting: Physician Assistant

## 2016-07-24 DIAGNOSIS — M4185 Other forms of scoliosis, thoracolumbar region: Secondary | ICD-10-CM | POA: Insufficient documentation

## 2016-07-24 DIAGNOSIS — R079 Chest pain, unspecified: Secondary | ICD-10-CM | POA: Diagnosis not present

## 2016-07-24 DIAGNOSIS — R05 Cough: Secondary | ICD-10-CM | POA: Insufficient documentation

## 2016-07-24 DIAGNOSIS — I700 Atherosclerosis of aorta: Secondary | ICD-10-CM | POA: Insufficient documentation

## 2016-07-24 DIAGNOSIS — R059 Cough, unspecified: Secondary | ICD-10-CM

## 2016-07-24 DIAGNOSIS — F331 Major depressive disorder, recurrent, moderate: Secondary | ICD-10-CM

## 2016-07-24 DIAGNOSIS — R0602 Shortness of breath: Secondary | ICD-10-CM | POA: Diagnosis not present

## 2016-07-31 ENCOUNTER — Telehealth: Payer: Self-pay | Admitting: Cardiology

## 2016-07-31 NOTE — Telephone Encounter (Signed)
Pt aware of her cxr results and verbalized understanding.

## 2016-07-31 NOTE — Telephone Encounter (Signed)
New message    Pt returning nurse call. Please call friends phone [phone]

## 2016-08-05 ENCOUNTER — Other Ambulatory Visit: Payer: Self-pay | Admitting: Family Medicine

## 2016-08-06 ENCOUNTER — Other Ambulatory Visit: Payer: Self-pay | Admitting: Emergency Medicine

## 2016-08-06 MED ORDER — CLONAZEPAM 0.5 MG PO TABS
0.5000 mg | ORAL_TABLET | Freq: Two times a day (BID) | ORAL | 1 refills | Status: DC | PRN
Start: 2016-08-06 — End: 2016-08-17

## 2016-08-09 ENCOUNTER — Ambulatory Visit: Payer: Medicare Other | Admitting: Psychiatry

## 2016-08-10 DIAGNOSIS — Z79899 Other long term (current) drug therapy: Secondary | ICD-10-CM | POA: Diagnosis not present

## 2016-08-15 DIAGNOSIS — J342 Deviated nasal septum: Secondary | ICD-10-CM | POA: Insufficient documentation

## 2016-08-15 DIAGNOSIS — R040 Epistaxis: Secondary | ICD-10-CM | POA: Diagnosis not present

## 2016-08-15 DIAGNOSIS — J324 Chronic pansinusitis: Secondary | ICD-10-CM | POA: Insufficient documentation

## 2016-08-15 DIAGNOSIS — J343 Hypertrophy of nasal turbinates: Secondary | ICD-10-CM | POA: Insufficient documentation

## 2016-08-16 ENCOUNTER — Other Ambulatory Visit: Payer: Self-pay | Admitting: Internal Medicine

## 2016-08-16 ENCOUNTER — Encounter: Payer: Self-pay | Admitting: Family Medicine

## 2016-08-16 ENCOUNTER — Ambulatory Visit (INDEPENDENT_AMBULATORY_CARE_PROVIDER_SITE_OTHER): Payer: Medicare Other | Admitting: Family Medicine

## 2016-08-16 VITALS — BP 157/92 | HR 84 | Temp 98.0°F | Ht 60.0 in | Wt 153.6 lb

## 2016-08-16 DIAGNOSIS — I10 Essential (primary) hypertension: Secondary | ICD-10-CM | POA: Diagnosis not present

## 2016-08-16 DIAGNOSIS — R05 Cough: Secondary | ICD-10-CM

## 2016-08-16 DIAGNOSIS — Z23 Encounter for immunization: Secondary | ICD-10-CM | POA: Diagnosis not present

## 2016-08-16 DIAGNOSIS — M069 Rheumatoid arthritis, unspecified: Secondary | ICD-10-CM

## 2016-08-16 DIAGNOSIS — R058 Other specified cough: Secondary | ICD-10-CM

## 2016-08-16 DIAGNOSIS — I308 Other forms of acute pericarditis: Secondary | ICD-10-CM

## 2016-08-16 DIAGNOSIS — R059 Cough, unspecified: Secondary | ICD-10-CM

## 2016-08-16 NOTE — Progress Notes (Signed)
[facility] [address] [place], [place] [address] [id number] [phone]  Date:  08/16/2016   Name:  Katherine Wang   DOB:  1946-06-05   MRN:  [id number]  PCP:  Lamar Blinks, MD    Chief Complaint: Follow-up (Pt here for f/u visit. Pt has seen pulmonary since last visit and was treated for a sinus infection. Also went to cardiologist and was dx with pericarditis. Would like flu vaccine today. )   History of Present Illness:  Katherine Wang is a 70 y.o. very pleasant female patient who presents with the following:  History of hypothyroidism, HTN.  Here today for a follow-up visit.    She was seen by cardiology on 9/22 and dx with likely pericarditis.  She was started on ibuprofen for 3 weeks and colchicine for 3 months. She notes that her pain is "much better," she was able to stop her medications for pericarditis.   She is a little upset as she really loved Dr. Aundra Dubin but he is now seeing only heart failure patients.  Reassured her that we will find her another cardiologist who she will like.    CXR last month looked fine.   She does have RA and is on methotrexate shots weekly; she is not sure of the dose. She does her shots on Tuedays. No recent prednisone  would like flu shot today; understands that it may not be as effective since she is on methotrexate but is not dangerous and still worth doing  She also saw Dr. Melvyn Novas in July and was treated with augmetin for a sinus infection. She did smoke years ago, but she was not suspected to have COPD.   She did have an allergy blood test (not scratch testing)- all was negative  She is now feeling pretty much back to normal, but does continue to have some chest congestion.  However she does not think that further testing will likely be helpful and does not want to continue going to pulmonology  Most recent labs looked ok  Results for orders placed or performed in visit on 07/20/16   CBC  Result Value Ref Range   WBC 8.3 3.8 - 10.8 K/uL   RBC 4.46 3.80 - 5.10 MIL/uL   Hemoglobin 13.1 11.7 - 15.5 g/dL   HCT 38.9 35.0 - 45.0 %   MCV 87.2 80.0 - 100.0 fL   MCH 29.4 27.0 - 33.0 pg   MCHC 33.7 32.0 - 36.0 g/dL   RDW 15.6 (H) 11.0 - 15.0 %   Platelets 281 140 - 400 K/uL   MPV 9.9 7.5 - 12.5 fL     Patient Active Problem List   Diagnosis Date Noted  . Ascending aortic aneurysm ([place]) 04/26/2016  . Upper airway cough syndrome 04/25/2016  . Hx of adenomatous colonic polyps 06/21/2015  . Hyperlipidemia 11/15/2014  . Rheumatoid arthritis ([place]) 09/01/2014  . Pericarditis 04/24/2011  . Aortic valve disorder 02/14/2009  . ELECTROCARDIOGRAM, ABNORMAL 02/14/2009  . PERNICIOUS ANEMIA 07/10/2007  . CANDIDIASIS, ORAL 07/03/2007  . DEPRESSION 07/03/2007  . MALAISE AND FATIGUE 07/03/2007  . HYPOTHYROIDISM 04/30/2007  . Essential hypertension 04/30/2007  . Aneurysm of thoracic aorta ([place]) 02/13/2006    Past Medical History:  Diagnosis Date  . Adenomatous colon polyp 2012  . Allergy   . Anxiety   . Aortic insufficiency   . Arthritis   . Basal cell carcinoma   . Cataract   .  Depression   . Diverticulosis   . Fibromyalgia   . Fibromyalgia   . Hyperlipidemia   . Hypertension   . Macular degeneration   . Melanoma ([place])   . Murmur, heart   . Reflux   . Stroke ([place])    per pt. mild stroke  . Thyroid disease     Past Surgical History:  Procedure Laterality Date  . ABDOMINAL HYSTERECTOMY    . APPENDECTOMY    . BASAL CELL CARCINOMA EXCISION    . CARDIAC CATHETERIZATION    . CHOLECYSTECTOMY    . COLONOSCOPY     30 + years ago, unsure of where  . EYE SURGERY    . LASIK Bilateral   . MELANOMA EXCISION    . NASAL SEPTUM SURGERY    . TUBAL LIGATION      Social History  Substance Use Topics  . Smoking status: Former Smoker    Packs/day: 2.00    Years: 20.00    Types: Cigarettes    Quit date: 10/30/1983  . Smokeless tobacco: Never Used  . Alcohol use No     Family History  Problem Relation Age of Onset  . Heart disease Mother   . Hypertension Mother   . Mental illness Mother   . Allergies Mother   . Heart disease Father   . Hypertension Father   . Mental illness Father   . Emphysema Father     smoked  . Allergies Father   . Hyperlipidemia Sister   . Hypertension Sister   . Hypertension Sister   . Diabetes Sister   . Asthma Sister   . Allergies Sister     Allergies  Allergen Reactions  . Tramadol     dizzy    Medication list has been reviewed and updated.  Current Outpatient Prescriptions on File Prior to Visit  Medication Sig Dispense Refill  . albuterol (PROVENTIL HFA;VENTOLIN HFA) 108 (90 Base) MCG/ACT inhaler Inhale 2 puffs into the lungs every 6 (six) hours as needed for wheezing or shortness of breath. 18 g 2  . aspirin EC 81 MG tablet Take 1 tablet (81 mg total) by mouth daily. 90 tablet 3  . atorvastatin (LIPITOR) 40 MG tablet TAKE 1 TABLET BY MOUTH EVERY DAY 30 tablet 10  . clonazePAM (KLONOPIN) 0.5 MG tablet Take 1 tablet (0.5 mg total) by mouth 2 (two) times daily as needed. 60 tablet 1  . Cyanocobalamin (VITAMIN B 12 PO) Take 1 tablet by mouth daily.    . cycloSPORINE (RESTASIS) 0.05 % ophthalmic emulsion 1 drop 2 (two) times daily.    . famotidine (PEPCID) 20 MG tablet One at bedtime 30 tablet 2  . fluticasone (FLONASE) 50 MCG/ACT nasal spray INSTILL 2 SPRAYS IN EACH NOSTRIL EVERY DAY 16 g 1  . folic acid (FOLVITE) 1 MG tablet Take 2 tablets by mouth daily.    Marland Kitchen ibuprofen (ADVIL,MOTRIN) 600 MG tablet One tablet three times daily x 3 weeks then stop 63 tablet 0  . Multiple Vitamins-Minerals (PRESERVISION AREDS PO) Take by mouth. Reported on 02/15/2016    . naproxen sodium (ANAPROX) 220 MG tablet Take 220 mg by mouth as needed.      Marland Kitchen Respiratory Therapy Supplies (FLUTTER) DEVI Use as directed 1 each 0  . sertraline (ZOLOFT) 100 MG tablet Take 2 tablets (200 mg total) by mouth daily. 180 tablet 2  . SYNTHROID  100 MCG tablet TAKE 1 TABLET BY MOUTH DAILY BEFORE BREAKFAST 90 tablet 3  . valsartan (  DIOVAN) 160 MG tablet Take 1 tablet (160 mg total) by mouth 2 (two) times daily. 60 tablet 6   No current facility-administered medications on file prior to visit.     Review of Systems:  As per HPI- otherwise negative.  No fever, chills, CP, SOB, nausea or vomiting   Physical Examination: Vitals:   08/16/16 1004 08/16/16 1015  BP: (!) 148/90 (!) 157/92  Pulse: 84   Temp: 98 F (36.7 C)    Vitals:   08/16/16 1004  Weight: 153 lb 9.6 oz (69.7 kg)  Height: 5' (1.524 m)   Body mass index is 30 kg/m. Ideal Body Weight: Weight in (lb) to have BMI = 25: 127.7  GEN: WDWN, NAD, Non-toxic, A & O x 3, looks well.   HEENT: Atraumatic, Normocephalic. Neck supple. No masses, No LAD.  Bilateral TM wnl, oropharynx normal.  PEERL,EOMI.   Ears and Nose: No external deformity. CV: RRR, No M/G/R. No JVD. No thrill. No extra heart sounds. PULM: CTA B, no wheezes, crackles, rhonchi. No retractions. No resp. distress. No accessory muscle use. ABD: S, NT, ND EXTR: No c/c/e NEURO Normal gait.  PSYCH: Normally interactive. Conversant. Not depressed or anxious appearing.  Calm demeanor.   Wt Readings from Last 3 Encounters:  08/16/16 153 lb 9.6 oz (69.7 kg)  07/20/16 153 lb (69.4 kg)  05/22/16 154 lb (69.9 kg)   BP Readings from Last 3 Encounters:  08/16/16 (!) 157/92  07/20/16 (!) 142/82  05/22/16 134/84    Assessment and Plan: Other acute pericarditis  Encounter for immunization - Plan: Flu vaccine HIGH DOSE PF  Cough  Essential hypertension  Rheumatoid arthritis, involving unspecified site, unspecified rheumatoid factor presence ([place])  Here today to follow-up and discuss recent illness.  She was dx with pericarditis about one month ago, sx are now resolved.  She will see cardiology in follow-up soon Flu shot today Her BP is under reasonable control She wonders if she can call me for early  abx treatment if she starts to get sick again- I asked her to contact me if she is feeling ill in the future so we can discuss asap  Signed Lamar Blinks, MD

## 2016-08-16 NOTE — Patient Instructions (Signed)
It would be find to use your albuterol as needed for wheezing.  I will see if Dr. Aundra Dubin might be able to see you one more time or if he can suggest one of his partners for you to see   Please let me know if you do start to develop a respiratory illness again and I will try to help!

## 2016-08-16 NOTE — Progress Notes (Signed)
Pre visit review using our clinic review tool, if applicable. No additional management support is needed unless otherwise documented below in the visit note. 

## 2016-08-17 ENCOUNTER — Encounter: Payer: Self-pay | Admitting: Physician Assistant

## 2016-08-17 ENCOUNTER — Ambulatory Visit (INDEPENDENT_AMBULATORY_CARE_PROVIDER_SITE_OTHER): Payer: Medicare Other | Admitting: Physician Assistant

## 2016-08-17 VITALS — BP 140/80 | HR 74 | Ht 60.0 in | Wt 155.6 lb

## 2016-08-17 DIAGNOSIS — I351 Nonrheumatic aortic (valve) insufficiency: Secondary | ICD-10-CM | POA: Diagnosis not present

## 2016-08-17 DIAGNOSIS — I309 Acute pericarditis, unspecified: Secondary | ICD-10-CM

## 2016-08-17 DIAGNOSIS — M069 Rheumatoid arthritis, unspecified: Secondary | ICD-10-CM | POA: Diagnosis not present

## 2016-08-17 DIAGNOSIS — R079 Chest pain, unspecified: Secondary | ICD-10-CM

## 2016-08-17 NOTE — Progress Notes (Signed)
Cardiology Office Note    Date:  08/18/2016   ID:  Katherine Wang, DOB [birth year], MRN [id number]  PCP:  Lamar Blinks, MD  Cardiologist:  Previously Dr. Aundra Dubin, will need to establish with another cardiologist since Dr. Aundra Dubin is transitioning from general cardiology to heart failure.  Chief Complaint  Patient presents with  . Follow-up    previously followed by Dr. Aundra Dubin, plan to establish with Dr. Sallyanne Kuster    History of Present Illness:  Wang Katherine is a very pleasant 70 y.o. female with past medical history of rheumatoid arthritis, aortic insufficiency, mild ascending aortic dilatation, and suspected prior acute pericarditis. Her cardiac history began in 2004 when she had possible acute pericarditis in [place]. Left heart cath at that time showed no significant disease. She has been followed for a malformed (though trileaflet) aortic valve. She has had aortic insufficiency and mildly dilated ascending aorta. She moved to [place] in 2012 to help her daughter who is austistic, she is back to [place] now. She had a stress test in 2015 in [place]. She think it is normal. Last echocardiogram in February 2016 showed EF 50-55%, moderate AI, 4 cm ascending aorta. CTA of the chest obtained in June 2017 showed 3.9 cm descending aorta. Her last cardiology follow-up was in June 2017 At which time she was doing well, her valsartan was increased to 160 mg twice a day and she was started on daily aspirin. A repeat echocardiogram was obtained on 05/10/2016 which shows EF 65-70%, no pericardial effusion, grade 1 diastolic dysfunction, moderate AR.  Saw the patient in the office on 07/20/2016, at which time she was having intermittent sharp substernal chest discomfort. She notices it more when she is laying down. She also notices it more when she is laying on the right side. She says her chest was sore to the touch on the surface however she also has a deep chest discomfort at the same  location that is unaffected by palpation. His EKG obtained in the office showed inferolateral downsloping ST segment with T wave inversion, however when compared to the previous EKG in January 2016, this was unchanged. I did discuss with DOB Dr. Ellyn Hack, her symptom was very atypical for angina, however seems be more consistent with pericarditis. We started the patient on colchicine and also ibuprofen. Patient presents today for follow-up.  Her pericarditis symptom has completely resolved roughly 2 weeks after started on ibuprofen and colchicine. However for the past 2-3 weeks, she has noticed another type of chest discomfort that mainly occurring with exertion and relieved by rest. She denies any exacerbating factors such as palpation or body rotation or deep inspiration. Her last Myoview in 2015 in [place] was negative. Her EKG today continued to show ST depression in the inferolateral leads unchanged since at least 2012. We have given her a copy of her EKG. I will also arrange a Lexiscan Myoview. She will need to be set up with a new primary cardiologist, I will refer the patient to Dr. Sallyanne Kuster    Past Medical History:  Diagnosis Date  . Adenomatous colon polyp 2012  . Allergy   . Anxiety   . Aortic insufficiency   . Arthritis   . Basal cell carcinoma   . Cataract   . Depression   . Diverticulosis   . Fibromyalgia   . Fibromyalgia   . Hyperlipidemia   . Hypertension   . Macular degeneration   . Melanoma ([place])   . Murmur, heart   .  Reflux   . Stroke ([place])    per pt. mild stroke  . Thyroid disease     Past Surgical History:  Procedure Laterality Date  . ABDOMINAL HYSTERECTOMY    . APPENDECTOMY    . BASAL CELL CARCINOMA EXCISION    . CARDIAC CATHETERIZATION    . CHOLECYSTECTOMY    . COLONOSCOPY     30 + years ago, unsure of where  . EYE SURGERY    . LASIK Bilateral   . MELANOMA EXCISION    . NASAL SEPTUM SURGERY    . TUBAL LIGATION      Current Medications: Outpatient  Medications Prior to Visit  Medication Sig Dispense Refill  . albuterol (PROVENTIL HFA;VENTOLIN HFA) 108 (90 Base) MCG/ACT inhaler Inhale 2 puffs into the lungs every 6 (six) hours as needed for wheezing or shortness of breath. 18 g 2  . aspirin EC 81 MG tablet Take 1 tablet (81 mg total) by mouth daily. 90 tablet 3  . atorvastatin (LIPITOR) 40 MG tablet TAKE 1 TABLET BY MOUTH EVERY DAY 30 tablet 10  . Cyanocobalamin (VITAMIN B 12 PO) Take 1 tablet by mouth daily.    . cycloSPORINE (RESTASIS) 0.05 % ophthalmic emulsion 1 drop 2 (two) times daily.    . famotidine (PEPCID) 20 MG tablet One at bedtime 30 tablet 2  . fluticasone (FLONASE) 50 MCG/ACT nasal spray INSTILL 2 SPRAYS IN EACH NOSTRIL EVERY DAY 16 g 1  . folic acid (FOLVITE) 1 MG tablet Take 2 tablets by mouth daily.    Marland Kitchen ibuprofen (ADVIL,MOTRIN) 600 MG tablet One tablet three times daily x 3 weeks then stop 63 tablet 0  . Multiple Vitamins-Minerals (PRESERVISION AREDS PO) Take by mouth. Reported on 02/15/2016    . naproxen sodium (ANAPROX) 220 MG tablet Take 220 mg by mouth as needed (PAIN).     Marland Kitchen Respiratory Therapy Supplies (FLUTTER) DEVI Use as directed 1 each 0  . sertraline (ZOLOFT) 100 MG tablet Take 2 tablets (200 mg total) by mouth daily. 180 tablet 2  . SYNTHROID 100 MCG tablet TAKE 1 TABLET BY MOUTH DAILY BEFORE BREAKFAST 90 tablet 3  . valsartan (DIOVAN) 160 MG tablet Take 1 tablet (160 mg total) by mouth 2 (two) times daily. 60 tablet 6  . clonazePAM (KLONOPIN) 0.5 MG tablet Take 1 tablet (0.5 mg total) by mouth 2 (two) times daily as needed. (Patient taking differently: Take 0.5 mg by mouth 2 (two) times daily as needed for anxiety. ) 60 tablet 1   No facility-administered medications prior to visit.      Allergies:   Tramadol   Social History   Social History  . Marital status: Divorced    Spouse name: N/A  . Number of children: 3  . Years of education: N/A   Occupational History  . retired    Social History Main  Topics  . Smoking status: Former Smoker    Packs/day: 2.00    Years: 20.00    Types: Cigarettes    Quit date: 10/30/1983  . Smokeless tobacco: Never Used  . Alcohol use No  . Drug use: No  . Sexual activity: Not Asked   Other Topics Concern  . None   Social History Narrative   Lives alone in a one story home.  Has 2 living children.  Retired Engineer, water.       Family History:  The patient's family history includes Allergies in her father, mother, and sister; Asthma in her sister; Diabetes in  her sister; Emphysema in her father; Heart attack in her father; Heart disease in her father and mother; Hyperlipidemia in her sister; Hypertension in her father, mother, sister, and sister; Mental illness in her father and mother.   ROS:   Please see the history of present illness.    ROS All other systems reviewed and are negative.   PHYSICAL EXAM:   VS:  BP 140/80   Pulse 74   Ht 5' (1.524 m)   Wt 155 lb 9.6 oz (70.6 kg)   BMI 30.39 kg/m    GEN: Well nourished, well developed, in no acute distress  HEENT: normal  Neck: no JVD, carotid bruits, or masses Cardiac: RRR; no murmurs, rubs, or gallops,no edema  Respiratory:  clear to auscultation bilaterally, normal work of breathing GI: soft, nontender, nondistended, + BS MS: no deformity or atrophy  Skin: warm and dry, no rash Neuro:  Alert and Oriented x 3, Strength and sensation are intact Psych: euthymic mood, full affect  Wt Readings from Last 3 Encounters:  08/17/16 155 lb 9.6 oz (70.6 kg)  08/16/16 153 lb 9.6 oz (69.7 kg)  07/20/16 153 lb (69.4 kg)      Studies/Labs Reviewed:   EKG:  EKG is ordered today.  The ekg ordered today demonstrates Persistent ST depression in inferolateral leads, unchanged since at least 2012.  Recent Labs: 03/19/2016: ALT 29; TSH 0.70 04/20/2016: Brain Natriuretic Peptide 19.4 05/04/2016: BUN 13; Creat 0.79; Potassium 4.0; Sodium 138 07/20/2016: Hemoglobin 13.1; Platelets 281   Lipid Panel      Component Value Date/Time   CHOL 147 04/20/2016 1548   TRIG 91 04/20/2016 1548   HDL 52 04/20/2016 1548   CHOLHDL 2.8 04/20/2016 1548   VLDL 18 04/20/2016 1548   LDLCALC 77 04/20/2016 1548    Additional studies/ records that were reviewed today include:   Echo 05/10/2016 LV EF: 65% -   70%  ------------------------------------------------------------------- Study Conclusions  - Left ventricle: The cavity size was normal. There was mild focal   basal hypertrophy of the septum. Systolic function was vigorous.   The estimated ejection fraction was in the range of 65% to 70%.   Wall motion was normal; there were no regional wall motion   abnormalities. Doppler parameters are consistent with abnormal   left ventricular relaxation (grade 1 diastolic dysfunction).   There was no evidence of elevated ventricular filling pressure by   Doppler parameters. - Aortic valve: There was moderate regurgitation. - Aortic root: The aortic root was normal in size. - Ascending aorta: The ascending aorta was normal in size. - Left atrium: The atrium was normal in size. - Right ventricle: Systolic function was normal. - Right atrium: The atrium was normal in size. - Tricuspid valve: There was trivial regurgitation. - Pulmonary arteries: Systolic pressure was within the normal   range. - Inferior vena cava: The vessel was normal in size. - Pericardium, extracardiac: There was no pericardial effusion.  Impressions:  - There is no significant difference from the study on 12/07/2014.    ASSESSMENT:    1. Chest pain on exertion   2. Acute pericarditis, unspecified type   3. Rheumatoid arthritis, involving unspecified site, unspecified rheumatoid factor presence ([place])   4. Moderate aortic regurgitation      PLAN:  In order of problems listed above:  1. Chest pain on exertion:  - Noticeable in the past 2-3 weeks, feels different from the previous pericarditis which has been resolved.  Will obtain outpatient Lexiscan  stress test.  2. Pericarditis: Recent pericarditis has been treated with both colchicine and ibuprofen, she have self discontinued colchicine 2 weeks after starting as her symptom was completely gone by then. She is still on high-dose ibuprofen which I told her to stop given concern of GI bleeding . 3. Moderate aortic regurgitation: Continue to monitor for now    Medication Adjustments/Labs and Tests Ordered: Current medicines are reviewed at length with the patient today.  Concerns regarding medicines are outlined above.  Medication changes, Labs and Tests ordered today are listed in the Patient Instructions below. Patient Instructions  Medication Instructions:  Continue current medications  Labwork: None Ordered  Testing/Procedures: Your physician has requested that you have a lexiscan myoview. For further information please visit [email or web address]. Please follow instruction sheet, as given.  Follow-Up: Your physician recommends that you schedule a follow-up appointment in: NP with Dr Sallyanne Kuster 2-3 months   Any Other Special Instructions Will Be Listed Below (If Applicable).   If you need a refill on your cardiac medications before your next appointment, please call your pharmacy.      Hilbert Corrigan, [place]  08/18/2016 12:23 AM    [facility] [address], [place], [place]  [address] Phone: [phone]; Fax: [phone]

## 2016-08-17 NOTE — Patient Instructions (Signed)
Medication Instructions:  Continue current medications  Labwork: None Ordered  Testing/Procedures: Your physician has requested that you have a lexiscan myoview. For further information please visit [email or web address]. Please follow instruction sheet, as given.  Follow-Up: Your physician recommends that you schedule a follow-up appointment in: NP with Dr Sallyanne Kuster 2-3 months   Any Other Special Instructions Will Be Listed Below (If Applicable).   If you need a refill on your cardiac medications before your next appointment, please call your pharmacy.

## 2016-08-18 ENCOUNTER — Encounter: Payer: Self-pay | Admitting: Physician Assistant

## 2016-08-21 ENCOUNTER — Telehealth: Payer: Self-pay | Admitting: Rheumatology

## 2016-08-21 MED ORDER — METHOTREXATE SODIUM CHEMO INJECTION 50 MG/2ML: INTRAMUSCULAR | 0 refills | Status: DC

## 2016-08-21 NOTE — Telephone Encounter (Signed)
I have not received labs/ was able to pull them up on [facility] they are WNL,   Last visit 04/26/16 Next visit 09/26/16

## 2016-08-21 NOTE — Telephone Encounter (Signed)
Patient states she is due for MTX inj today, but she has not gotten refill yet. Patient states she came in for lab work in order to get the medication refilled and it has not been refilled yet. Please advise.

## 2016-08-22 ENCOUNTER — Inpatient Hospital Stay (HOSPITAL_COMMUNITY): Admission: RE | Admit: 2016-08-22 | Payer: Medicare Other | Source: Ambulatory Visit

## 2016-09-10 ENCOUNTER — Telehealth: Payer: Self-pay | Admitting: Rheumatology

## 2016-09-10 MED ORDER — TUBERCULIN-ALLERGY SYRINGES 28G X 1/2" 1 ML MISC
3 refills | Status: DC
Start: 2016-09-10 — End: 2017-01-18

## 2016-09-10 NOTE — Telephone Encounter (Signed)
Patient picked up MTX at [facility] but did not receive any injection syringes. Patient is due tomorrow for injection.

## 2016-09-10 NOTE — Telephone Encounter (Signed)
Sent syringes.

## 2016-09-24 ENCOUNTER — Other Ambulatory Visit: Payer: Self-pay | Admitting: Family Medicine

## 2016-09-25 NOTE — Telephone Encounter (Signed)
Last seen 08/16/16 Never filled by you Sig Take 0.5 mg by mouth 2 (two) times daily as needed for anxiety.  Please advise  PC

## 2016-09-25 NOTE — Progress Notes (Signed)
Office Visit Note  Patient: Katherine Wang             Date of Birth: [birth year]           MRN: [id number]             PCP: Lamar Blinks, MD Referring: [name], MD Visit Date: 09/26/2016 Occupation: @GUAROCC @    Subjective:  No chief complaint on file. Complaining of ongoing pain from her rheumatoid arthritis to bilateral hands. Also involving bilateral elbow joint and knee joint.    History of Present Illness: Katherine Wang is a 70 y.o. female  Last seen 04/26/2016. At that time she was on oral methotrexate 8 pills per week and folic acid 2 per day as well as her Arava 10 mg daily. She was not responding well to this combination  As a result on the last visit, we switched her to injectable methotrexate. Patient has a history of doing well with injectable methotrexate so she decided to stop the arava on her own to see if the injectable methotrexate is able to address her rheumatoid arthritis. It did not. She ended up using 400 mg of Advil twice a day.  We decided to add arava  back on today's visit and patient is agreeable. Our plan is to see how she responds to this medication over the next 3 months. If she does well that we will continue this combination.  Note that we cannot add a biologic because she has a history of melanoma diagnosed back in 2005. She also has a new diagnosis of basal cell cancer as above 2014 to the left near.  As a result we will not be able to add a biologic to her treatment plan at this time and we will have to work with [facility].  She is complaining of bilateral elbow joint and knee joint pain. She is having some left knee joint pain and swelling. She feels like there is a Child psychotherapist cyst filling up again behind her left knee.   Activities of Daily Living:  Patient reports morning stiffness for 30 minutes.   Patient Reports nocturnal pain.  Difficulty dressing/grooming: Reports Difficulty climbing stairs: Reports Difficulty getting out  of chair: Reports Difficulty using hands for taps, buttons, cutlery, and/or writing: Reports   Review of Systems  Constitutional: Negative for fatigue.  HENT: Negative for mouth sores and mouth dryness.   Eyes: Negative for dryness.  Respiratory: Negative for shortness of breath.   Gastrointestinal: Negative for constipation and diarrhea.  Musculoskeletal: Negative for myalgias and myalgias.  Skin: Negative for sensitivity to sunlight.  Psychiatric/Behavioral: Negative for decreased concentration and sleep disturbance.    PMFS History:  Patient Active Problem List   Diagnosis Date Noted  . Ascending aortic aneurysm ([place]) 04/26/2016  . Upper airway cough syndrome 04/25/2016  . Hx of adenomatous colonic polyps 06/21/2015  . Hyperlipidemia 11/15/2014  . Rheumatoid arthritis ([place]) 09/01/2014  . Pericarditis 04/24/2011  . Aortic valve disorder 02/14/2009  . ELECTROCARDIOGRAM, ABNORMAL 02/14/2009  . PERNICIOUS ANEMIA 07/10/2007  . CANDIDIASIS, ORAL 07/03/2007  . DEPRESSION 07/03/2007  . MALAISE AND FATIGUE 07/03/2007  . HYPOTHYROIDISM 04/30/2007  . Essential hypertension 04/30/2007  . Aneurysm of thoracic aorta ([place]) 02/13/2006    Past Medical History:  Diagnosis Date  . Adenomatous colon polyp 2012  . Allergy   . Anxiety   . Aortic insufficiency   . Arthritis   . Basal cell carcinoma   . Cataract   . Depression   .  Diverticulosis   . Fibromyalgia   . Fibromyalgia   . Hyperlipidemia   . Hypertension   . Macular degeneration   . Melanoma ([place])   . Murmur, heart   . Reflux   . Stroke ([place])    per pt. mild stroke  . Thyroid disease     Family History  Problem Relation Age of Onset  . Heart disease Mother   . Hypertension Mother   . Mental illness Mother   . Allergies Mother   . Heart disease Father   . Hypertension Father   . Mental illness Father   . Emphysema Father     smoked  . Allergies Father   . Heart attack Father   . Hyperlipidemia Sister   .  Hypertension Sister   . Allergies Sister   . Hypertension Sister   . Diabetes Sister   . Allergies Sister    Past Surgical History:  Procedure Laterality Date  . ABDOMINAL HYSTERECTOMY    . APPENDECTOMY    . BASAL CELL CARCINOMA EXCISION    . CARDIAC CATHETERIZATION    . CHOLECYSTECTOMY    . COLONOSCOPY     30 + years ago, unsure of where  . EYE SURGERY    . LASIK Bilateral   . MELANOMA EXCISION    . NASAL SEPTUM SURGERY    . TUBAL LIGATION     Social History   Social History Narrative   Lives alone in a one story home.  Has 2 living children.  Retired Engineer, water.       Objective: Vital Signs: BP (!) 157/85 (BP Location: Left Arm, Patient Position: Sitting, Cuff Size: Large)   Pulse 80   Resp 12   Ht 5' (1.524 m)   Wt 152 lb (68.9 kg)   BMI 29.69 kg/m    Physical Exam  Constitutional: She is oriented to person, place, and time. She appears well-developed and well-nourished.  HENT:  Head: Normocephalic and atraumatic.  Eyes: EOM are normal. Pupils are equal, round, and reactive to light.  Cardiovascular: Normal rate, regular rhythm and normal heart sounds.  Exam reveals no gallop and no friction rub.   No murmur heard. Pulmonary/Chest: Effort normal and breath sounds normal. She has no wheezes. She has no rales.  Abdominal: Soft. Bowel sounds are normal. She exhibits no distension. There is no tenderness. There is no guarding. No hernia.  Musculoskeletal: Normal range of motion. She exhibits no edema, tenderness or deformity.  Lymphadenopathy:    She has no cervical adenopathy.  Neurological: She is alert and oriented to person, place, and time. Coordination normal.  Skin: Skin is warm and dry. Capillary refill takes less than 2 seconds. No rash noted.  Psychiatric: She has a normal mood and affect. Her behavior is normal.     Musculoskeletal Exam:  Full range of motion of all joints Grip strength is equal and strong bilaterally Fibromyalgia tender points are  all absent  CDAI Exam: CDAI Homunculus Exam:   Tenderness:  Right hand: 2nd MCP and 3rd MCP Left hand: 3rd MCP  Swelling:  Right hand: 2nd MCP and 3rd MCP Left hand: 3rd MCP  Joint Counts:  CDAI Tender Joint count: 3 CDAI Swollen Joint count: 3  Global Assessments:  Patient Global Assessment: 7 Provider Global Assessment: 7  CDAI Calculated Score: 20  Synovitis noted to the right second and third MCP joint and left third MCP joint   Investigation: No additional findings. Last labs are from9/25/2017 which include  CBC with differential which are normal. We didn't have a CMP on that visit. Her last BMP that we have is from 05/04/2016 which shows BMP to be normal.   Imaging: No results found.  Speciality Comments: No specialty comments available.    Procedures:  Large Joint Inj Date/Time: 09/26/2016 12:44 PM Performed by: Eliezer Lofts Authorized by: Eliezer Lofts   Consent Given by:  Patient Site marked: the procedure site was marked   Timeout: prior to procedure the correct patient, procedure, and site was verified   Indications:  Pain and joint swelling Location:  Knee Site:  L knee Prep: patient was prepped and draped in usual sterile fashion   Needle Size:  27 G Needle Length:  1.5 inches Approach:  Medial Ultrasound Guidance: No   Fluoroscopic Guidance: No   Arthrogram: No   Medications:  1.5 mL lidocaine 1 %; 40 mg triamcinolone acetonide 40 MG/ML Aspiration Attempted: Yes   Patient tolerance:  Patient tolerated the procedure well with no immediate complications   Allergies: Tramadol   Assessment / Plan:     Visit Diagnoses: Rheumatoid arthritis with rheumatoid factor of multiple sites without organ or systems involvement ([place])  High risk medications (not anticoagulants) long-term use  Baker's cyst of knee, left - Plan: Large Joint Injection/Arthrocentesis  Pain in both knees, unspecified chronicity  Pain of both elbows  Malignant  melanoma, unspecified site ([place])  Basal cell carcinoma, unspecified site    Patient has rheumatoid arthritis with rheumatoid factor positive History of no synovitis on the last visit on 04/26/2016 Was taking oral methotrexate 8 pills per week but was having inadequate response and we added a ARAVA.  Patient ended up not taking the or a viral and we switched the patient to injectable methotrexate and she wanted to try and see if methotrexate injectable by itself would adequately control her discomfort. On today's visit she stating that she is ending up using more Advil because of the pain. She is using 400 mg twice a day. We've asked the patient to add the R-wave of back to the injectable methotrexate and she is agreeable and we will see how she doesn't 3 months.  Due to the elevated blood pressure today, we will only be able to inject the left knee instead of both knees. After informed consent was obtained the site was prepped in sterile fashion and injected with 40 mg of Kenalog mixed with 1 L 1% lidocaine. Patient tolerated procedure well. There no complications If her blood pressures better she can get another injection of cortisone in the right knee in the next 1 or 2 weeks if she needs it.  I will refill her methotrexate folic acid and Arava as well as 27-gauge tuberculin syringe for the use of injecting methotrexate. Note that patient had wrong syringe provided for her by the pharmacy and it was painful to use for methotrexate injections. I've given her 2 tuberculin syringes from our stock so that patient has at least 2 injections that she can give with less pain.  Return to clinic in 3 months    Orders: Orders Placed This Encounter  Procedures  . Large Joint Injection/Arthrocentesis   Meds ordered this encounter  Medications  . methotrexate 50 MG/2ML injection    Sig: Inject 0.17m sub q once a week    Dispense:  10 mL    Refill:  0    Dispense mtx with preservatives (253mx  5 vials w/ preservatives)    Order Specific  Question:   Supervising Provider    Answer:   Lyda Perone  . folic acid (FOLVITE) 1 MG tablet    Sig: Take 2 tablets (2 mg total) by mouth daily.    Dispense:  180 tablet    Refill:  4    Order Specific Question:   Supervising Provider    Answer:   Bo Merino [[id number]]  . Tuberculin-Allergy Syringes 27G X 1/2" 1 ML KIT    Sig: Inject 1 Syringe into the skin once a week. If syringe size greater than 19m, please call me at my office.    Dispense:  12 each    Refill:  4    Order Specific Question:   Supervising Provider    Answer:   DLyda Perone . leflunomide (ARAVA) 10 MG tablet    Sig: Take 1 tablet (10 mg total) by mouth daily.    Dispense:  90 tablet    Refill:  0    Order Specific Question:   Supervising Provider    Answer:   DBo Merino[[phone]   Face-to-face time spent with patient was 40 minutes. 50% of time was spent in counseling and coordination of care.  Follow-Up Instructions: Return in about 3 months (around 12/26/2016) for RA, MTXinj 0.8, left bakers cyst, restart arava nov 2017.   NEliezer Lofts PA-C   I examined and evaluated the patient with NEliezer LoftsPA. The plan of care was discussed as noted above.  SBo Merino MD

## 2016-09-26 ENCOUNTER — Encounter: Payer: Self-pay | Admitting: Rheumatology

## 2016-09-26 ENCOUNTER — Ambulatory Visit (INDEPENDENT_AMBULATORY_CARE_PROVIDER_SITE_OTHER): Payer: Medicare Other | Admitting: Rheumatology

## 2016-09-26 ENCOUNTER — Telehealth: Payer: Self-pay | Admitting: *Deleted

## 2016-09-26 VITALS — BP 157/85 | HR 80 | Resp 12 | Ht 60.0 in | Wt 152.0 lb

## 2016-09-26 DIAGNOSIS — Z79899 Other long term (current) drug therapy: Secondary | ICD-10-CM | POA: Diagnosis not present

## 2016-09-26 DIAGNOSIS — M25561 Pain in right knee: Secondary | ICD-10-CM

## 2016-09-26 DIAGNOSIS — M25562 Pain in left knee: Secondary | ICD-10-CM

## 2016-09-26 DIAGNOSIS — M7122 Synovial cyst of popliteal space [Baker], left knee: Secondary | ICD-10-CM

## 2016-09-26 DIAGNOSIS — M25521 Pain in right elbow: Secondary | ICD-10-CM | POA: Diagnosis not present

## 2016-09-26 DIAGNOSIS — M0579 Rheumatoid arthritis with rheumatoid factor of multiple sites without organ or systems involvement: Secondary | ICD-10-CM | POA: Diagnosis not present

## 2016-09-26 DIAGNOSIS — C4491 Basal cell carcinoma of skin, unspecified: Secondary | ICD-10-CM

## 2016-09-26 DIAGNOSIS — C439 Malignant melanoma of skin, unspecified: Secondary | ICD-10-CM | POA: Diagnosis not present

## 2016-09-26 DIAGNOSIS — M25522 Pain in left elbow: Secondary | ICD-10-CM | POA: Diagnosis not present

## 2016-09-26 MED ORDER — "TUBERCULIN-ALLERGY SYRINGES 27G X 1/2"" 1 ML KIT": 1.0000 | PACK | 4 refills | Status: DC

## 2016-09-26 MED ORDER — METHOTREXATE SODIUM CHEMO INJECTION 50 MG/2ML: INTRAMUSCULAR | 0 refills | Status: DC

## 2016-09-26 MED ORDER — LIDOCAINE HCL 1 % IJ SOLN
1.5000 mL | INTRAMUSCULAR | Status: AC | PRN
  Administered 2016-09-26: 1.5 mL

## 2016-09-26 MED ORDER — LEFLUNOMIDE 10 MG PO TABS: 10.0000 mg | ORAL_TABLET | Freq: Every day | ORAL | 0 refills | Status: DC

## 2016-09-26 MED ORDER — TRIAMCINOLONE ACETONIDE 40 MG/ML IJ SUSP
40.0000 mg | INTRAMUSCULAR | Status: AC | PRN
  Administered 2016-09-26: 40 mg via INTRA_ARTICULAR

## 2016-09-26 MED ORDER — FOLIC ACID 1 MG PO TABS: 2.0000 mg | ORAL_TABLET | Freq: Every day | ORAL | 4 refills | Status: DC

## 2016-09-27 ENCOUNTER — Telehealth: Payer: Self-pay | Admitting: Emergency Medicine

## 2016-09-27 MED ORDER — CLONAZEPAM 0.5 MG PO TABS: 0.5000 mg | ORAL_TABLET | Freq: Two times a day (BID) | ORAL | 1 refills | Status: DC | PRN

## 2016-09-27 NOTE — Telephone Encounter (Signed)
Patient wants syringes that were RX previously. Patient states new RX for syringes are more painful. Amy Littrell advised patient to have pharmacy send correct RX RF to office.

## 2016-09-27 NOTE — Telephone Encounter (Signed)
Received refill request for CLONAZEPAM 0.5MG  TABLETS. Last visit and refill on 07/2016. Is it ok to refill? Please advise.

## 2016-09-28 ENCOUNTER — Other Ambulatory Visit: Payer: Self-pay | Admitting: Emergency Medicine

## 2016-09-28 NOTE — Telephone Encounter (Signed)
As per pharmacy never received Rx, patient requesting office to call pharmacy, please advise

## 2016-09-28 NOTE — Telephone Encounter (Signed)
Called pharmacy to verify that CLONAZEPAM 0.5 MG TABLETS refill has been received. Pharmacy verified that they do have pt's refill ready for pick up. Called pt to inform.

## 2016-10-08 ENCOUNTER — Other Ambulatory Visit: Payer: Self-pay | Admitting: Emergency Medicine

## 2016-10-08 ENCOUNTER — Other Ambulatory Visit: Payer: Self-pay | Admitting: Family Medicine

## 2016-10-08 DIAGNOSIS — E039 Hypothyroidism, unspecified: Secondary | ICD-10-CM

## 2016-10-08 MED ORDER — SYNTHROID 100 MCG PO TABS: ORAL_TABLET | ORAL | 3 refills | Status: DC

## 2016-10-11 ENCOUNTER — Encounter: Payer: Self-pay | Admitting: Cardiology

## 2016-10-11 ENCOUNTER — Ambulatory Visit (INDEPENDENT_AMBULATORY_CARE_PROVIDER_SITE_OTHER): Payer: Medicare Other | Admitting: Cardiology

## 2016-10-11 VITALS — BP 124/80 | HR 80 | Ht 60.0 in | Wt 152.0 lb

## 2016-10-11 DIAGNOSIS — I309 Acute pericarditis, unspecified: Secondary | ICD-10-CM

## 2016-10-11 DIAGNOSIS — R079 Chest pain, unspecified: Secondary | ICD-10-CM

## 2016-10-11 DIAGNOSIS — I351 Nonrheumatic aortic (valve) insufficiency: Secondary | ICD-10-CM

## 2016-10-11 NOTE — Patient Instructions (Signed)
Medication Instructions:  Your physician recommends that you continue on your current medications as directed. Please refer to the Current Medication list given to you today.   Labwork: None   Testing/Procedures: Your physician has requested that you have a lexiscan myoview. For further information please visit [email or web address]. Please follow instruction sheet, as given.    Follow-Up: Your physician wants you to follow-up in: 6 months with Dr End. (June 2018). You will receive a reminder letter in the mail two months in advance. If you don't receive a letter, please call our office to schedule the follow-up appointment.       If you need a refill on your cardiac medications before your next appointment, please call your pharmacy.

## 2016-10-11 NOTE — Progress Notes (Signed)
Patient ID: Katherine Wang, female   DOB: 11/26/1945, 70 y.o.   MRN: [id number] PCP: Dr. Janett Billow Copland  70 yo with history of rheumatoid arthritis, aortic insufficiency, mild ascending aortic dilatation, and suspected prior acute pericarditis returns for cardiology followup.  Patient's cardiac history begins in 2004 when she had possible acute pericarditis in [place].  LHC at that time showed no significant disease.  In our office, she has been followed for a malformed (though trileaflet) aortic valve.  She has had aortic insufficiency and a mildly dilated ascending aorta.  She moved to [place] in 2012 to help her daughter who has an autistic child.  She is back in [place] now.  She says she had a stress test at some time in 45 in [place].  She thinks it was normal.  Last echo in 7/17 showed EF 65-70% with moderate AI.  CTA chest in 6/17 showed 3.9 cm ascending aorta.   She had what sounds like a pericarditis flare in 10/17 and was treated with colchicine and ibuprofen with improvement.  Cardiolite was ordered but she cancelled it.   She still has occasional chest pain.  It is not exertional, no particular trigger. Much improved compared to 10/17.  She can walk around the block without dyspnea but is short of breath walking up a flight of steps.  No orthopnea, PND, lightheadedness, or palpitations.  Labs (6/12): BNP 86, ESR 83 Labs (11/15): K 3.8, creatinine 0.94, TSH normal Labs (6/17): Na 131, K 3.7, creatinine 0.8, LDL 77, HDL 52  PMH: 1. Acute pericarditis in 2004: Hospitalized in [place].  Had LHC at that time that she reports as showing no significant CAD.  Possible recurrent pericarditis 6/12 and 10/17.  2. 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  Last echo (4/10) showed EF 55-60%, mild LV dilation, mild LV hypertrophy, mild aortic insufficiency, mild MR.  Cardiac MRI (4/10) confirmed that the aortic valve was trileaflet but malformed, EF 64%.  -  Echo (6/12): EF 50%, moderate (grade II) diastolic dysfunction, mild AI, mild MR, no pericardial effusion.   - Echo (2/16): EF 50-55%, moderate AI, ascending aorta 4.0 cm, mild MR.  - Echo (7/17): EF 65-70%, moderate AI 3.  Ascending aorta dilation: MRA chest (4/10) with 4 x 3.6 cm ascending aorta.  CTA chest (7/11) with 3.7 x 3.7 cm ascending aorta and aberrant right subclavian course.  - CTA chest 6/17 with 3.9 cm ascending aorta.  4.  HTN 5.  Rheumatoid arthritis  6.  H/o rheumatic fever in childhood.  7. Hyperlipidemia 8. Aberrant course right subclavian artery  SH: Lives alone, single.  Divorced, 2 daughters.  Works a Camera operator.  Quit smoking in the 1980s.    FH: Mother with angina.   Father with MI at 1.    ROS: All systems reviewed and negative except as per HPI.   Current Outpatient Prescriptions  Medication Sig Dispense Refill  . albuterol (PROVENTIL HFA;VENTOLIN HFA) 108 (90 Base) MCG/ACT inhaler Inhale 2 puffs into the lungs every 6 (six) hours as needed for wheezing or shortness of breath. 18 g 2  . aspirin EC 81 MG tablet Take 1 tablet (81 mg total) by mouth daily. 90 tablet 3  . atorvastatin (LIPITOR) 40 MG tablet TAKE 1 TABLET BY MOUTH EVERY DAY 30 tablet 10  . clonazePAM (KLONOPIN) 0.5 MG tablet Take 1 tablet (0.5 mg total) by mouth 2 (two) times daily as needed for anxiety. 30 tablet 1  .  Cyanocobalamin (VITAMIN B 12 PO) Take 1 tablet by mouth daily.    . cycloSPORINE (RESTASIS) 0.05 % ophthalmic emulsion 1 drop 2 (two) times daily.    . famotidine (PEPCID) 20 MG tablet One at bedtime 30 tablet 2  . fluticasone (FLONASE) 50 MCG/ACT nasal spray INSTILL 2 SPRAYS IN EACH NOSTRIL EVERY DAY 16 g 1  . folic acid (FOLVITE) 1 MG tablet Take 2 tablets (2 mg total) by mouth daily. 180 tablet 4  . ibuprofen (ADVIL,MOTRIN) 600 MG tablet One tablet three times daily x 3 weeks then stop 63 tablet 0  . leflunomide (ARAVA) 10 MG tablet Take 1 tablet (10 mg total) by mouth  daily. 90 tablet 0  . methotrexate 50 MG/2ML injection Inject 0.49m sub q once a week 10 mL 0  . Multiple Vitamins-Minerals (PRESERVISION AREDS PO) Take by mouth. Reported on 02/15/2016    . naproxen sodium (ANAPROX) 220 MG tablet Take 220 mg by mouth as needed (PAIN).     .Marland KitchenRespiratory Therapy Supplies (FLUTTER) DEVI Use as directed 1 each 0  . sertraline (ZOLOFT) 100 MG tablet Take 2 tablets (200 mg total) by mouth daily. 180 tablet 2  . SYNTHROID 100 MCG tablet TAKE 1 TABLET BY MOUTH DAILY BEFORE BREAKFAST 90 tablet 3  . Tuberculin-Allergy Syringes 27G X 1/2" 1 ML KIT Inject 1 Syringe into the skin once a week. If syringe size greater than 158m please call me at my office. 12 each 4  . Tuberculin-Allergy Syringes 28G X 1/2" 1 ML MISC To inject Methotrexate weekly 12 each 3  . valsartan (DIOVAN) 160 MG tablet Take 1 tablet (160 mg total) by mouth 2 (two) times daily. 60 tablet 6   No current facility-administered medications for this visit.     BP 124/80   Pulse 80   Ht 5' (1.524 m)   Wt 152 lb (68.9 kg)   BMI 29.69 kg/m  General: NAD Neck: No JVD, no thyromegaly or thyroid nodule.  Lungs: Occasional rhonchi. CV: Nondisplaced PMI.  Heart regular S1/S2, no S3Z3/G61/6 diastolic murmur RUSB.  No peripheral edema.  No carotid bruit.  Normal pedal pulses.  Abdomen: Soft, nontender, no hepatosplenomegaly, no distention.  Skin: Intact without lesions or rashes.  Neurologic: Alert and oriented x 3.  Psych: Normal affect. Extremities: No clubbing or cyanosis.  HEENT: Normal.   Assessment/Plan: 1. Aortic valve disorder: Malformed aortic valve with moderate AI on 7/17 echo.  She has mild exertional symptoms.  2. Ascending aorta dilation: Mild on CTA chest in 6/17.   3. HTN: BP controlled.   4. Hyperlipidemia: Continue atorvastatin, good lipids in 6/17.  5. Chest pain: Atypical.  Possible flare of pericarditis in 10/17, better with ibuprofen and colchicine.  She wants to restart an exercise  regimen.  I think that it would be reasonable to risk stratify with Lexiscan Cardiolite.    Followup in 6 months. She will see Dr End given my transition to CHF clinic.   DaLoralie Champagne2/14/2017

## 2016-10-12 ENCOUNTER — Telehealth (HOSPITAL_COMMUNITY): Payer: Self-pay | Admitting: *Deleted

## 2016-10-12 NOTE — Telephone Encounter (Signed)
Patient given detailed instructions per Myocardial Perfusion Study Information Sheet for the test on 10/15/16 at 7:45. Patient notified to arrive 15 minutes early and that it is imperative to arrive on time for appointment to keep from having the test rescheduled.  If you need to cancel or reschedule your appointment, please call the office within 24 hours of your appointment. Failure to do so may result in a cancellation of your appointment, and a $50 no show fee. Patient verbalized understanding.Katherine Kitchenm4

## 2016-10-15 ENCOUNTER — Ambulatory Visit (HOSPITAL_COMMUNITY): Payer: Medicare Other | Attending: Internal Medicine

## 2016-10-15 DIAGNOSIS — R079 Chest pain, unspecified: Secondary | ICD-10-CM | POA: Diagnosis not present

## 2016-10-15 DIAGNOSIS — I510 Cardiac septal defect, acquired: Secondary | ICD-10-CM | POA: Diagnosis not present

## 2016-10-15 LAB — MYOCARDIAL PERFUSION IMAGING
CHL CUP NUCLEAR SDS: 2
CHL CUP RESTING HR STRESS: 76 {beats}/min
LHR: 0.27
LVDIAVOL: 83 mL (ref 46–106)
LVSYSVOL: 37 mL
NUC STRESS TID: 0.85
Peak HR: 122 {beats}/min
SRS: 0
SSS: 2

## 2016-10-15 MED ORDER — AMINOPHYLLINE 25 MG/ML IV SOLN
75.0000 mg | Freq: Once | INTRAVENOUS | Status: AC
  Administered 2016-10-15: 75 mg via INTRAVENOUS

## 2016-10-15 MED ORDER — REGADENOSON 0.4 MG/5ML IV SOLN
0.4000 mg | Freq: Once | INTRAVENOUS | Status: AC
  Administered 2016-10-15: 0.4 mg via INTRAVENOUS

## 2016-10-15 MED ORDER — TECHNETIUM TC 99M TETROFOSMIN IV KIT
10.2000 | PACK | Freq: Once | INTRAVENOUS | Status: AC | PRN
  Administered 2016-10-15: 10.2 via INTRAVENOUS
  Filled 2016-10-15: qty 11

## 2016-10-15 MED ORDER — TECHNETIUM TC 99M TETROFOSMIN IV KIT
32.7000 | PACK | Freq: Once | INTRAVENOUS | Status: AC | PRN
  Administered 2016-10-15: 32.7 via INTRAVENOUS
  Filled 2016-10-15: qty 33

## 2016-11-08 ENCOUNTER — Ambulatory Visit: Payer: Self-pay | Admitting: Cardiovascular Disease

## 2016-11-21 ENCOUNTER — Telehealth: Payer: Self-pay | Admitting: Family Medicine

## 2016-11-21 NOTE — Telephone Encounter (Signed)
Relation to [id number] Call back number:[phone] Pharmacy: [facility] [place], [address] AT [address] [phone] ([phone] (Fax)     Reason for call:  Patient requesting a refill famotidine (PEPCID) 20 MG tablet and clonazePAM (KLONOPIN) 0.5 MG tablet

## 2016-11-22 ENCOUNTER — Other Ambulatory Visit: Payer: Self-pay | Admitting: Emergency Medicine

## 2016-11-22 MED ORDER — CLONAZEPAM 0.5 MG PO TABS: 0.5000 mg | ORAL_TABLET | Freq: Two times a day (BID) | ORAL | 1 refills | Status: DC | PRN

## 2016-11-22 NOTE — Telephone Encounter (Signed)
Patient calling back checking on the status of medication refill, patient has only 1 pill informed patient of ETA being 24 hours, patient is concerned due to PCP being off on Friday, please advise  [facility] [place], [address] AT [facility] [address] [place] [phone] (Phone) [phone] (Fax)

## 2016-11-22 NOTE — Telephone Encounter (Signed)
Reviewed NCCSR- nothing of concern.  Called in RF for her

## 2016-11-22 NOTE — Telephone Encounter (Signed)
Provider informed rx will be called in by the end of day.

## 2016-11-24 ENCOUNTER — Other Ambulatory Visit: Payer: Self-pay | Admitting: Family Medicine

## 2016-11-26 ENCOUNTER — Other Ambulatory Visit: Payer: Self-pay | Admitting: Family Medicine

## 2016-11-26 ENCOUNTER — Other Ambulatory Visit: Payer: Self-pay | Admitting: Emergency Medicine

## 2016-11-26 MED ORDER — SERTRALINE HCL 100 MG PO TABS: 200.0000 mg | ORAL_TABLET | Freq: Every day | ORAL | 0 refills | Status: DC

## 2016-11-26 NOTE — Telephone Encounter (Signed)
Relation to [id number] Call back [phone] Pharmacy: [facility] [place], [address] AT [address] [phone] (Phone) [phone] (Fax)     Reason for call:  Patient requesting a refill sertraline (ZOLOFT) 100 MG tablet

## 2016-11-26 NOTE — Addendum Note (Signed)
Addended by: Rudene Anda on: 11/26/2016 05:31 PM   Modules accepted: Orders

## 2016-11-26 NOTE — Telephone Encounter (Addendum)
Last filled:  01/31/16 Amt: 180, 2 Last OV: 08/16/16 Pt needs an appt soon. Rx sent to pharmacy with note attached.

## 2016-12-19 DIAGNOSIS — M25561 Pain in right knee: Secondary | ICD-10-CM | POA: Insufficient documentation

## 2016-12-19 DIAGNOSIS — C4491 Basal cell carcinoma of skin, unspecified: Secondary | ICD-10-CM | POA: Insufficient documentation

## 2016-12-19 DIAGNOSIS — M25562 Pain in left knee: Secondary | ICD-10-CM

## 2016-12-19 DIAGNOSIS — M25522 Pain in left elbow: Secondary | ICD-10-CM

## 2016-12-19 DIAGNOSIS — Z79899 Other long term (current) drug therapy: Secondary | ICD-10-CM | POA: Insufficient documentation

## 2016-12-19 DIAGNOSIS — C439 Malignant melanoma of skin, unspecified: Secondary | ICD-10-CM | POA: Insufficient documentation

## 2016-12-19 DIAGNOSIS — M7122 Synovial cyst of popliteal space [Baker], left knee: Secondary | ICD-10-CM | POA: Insufficient documentation

## 2016-12-19 DIAGNOSIS — M25521 Pain in right elbow: Secondary | ICD-10-CM | POA: Insufficient documentation

## 2016-12-19 NOTE — Progress Notes (Deleted)
Office Visit Note  Patient: Katherine Wang             Date of Birth: [birth year]           MRN: [id number]             PCP: Lamar Blinks, MD Referring: [name], MD Visit Date: 12/20/2016 Occupation: @GUAROCC @    Subjective:  No chief complaint on file.   History of Present Illness: Katherine Wang is a 71 y.o. female ***   Activities of Daily Living:  Patient reports morning stiffness for *** {minute/hour:19697}.   Patient {ACTIONS;DENIES/REPORTS:21021675::"Denies"} nocturnal pain.  Difficulty dressing/grooming: {ACTIONS;DENIES/REPORTS:21021675::"Denies"} Difficulty climbing stairs: {ACTIONS;DENIES/REPORTS:21021675::"Denies"} Difficulty getting out of chair: {ACTIONS;DENIES/REPORTS:21021675::"Denies"} Difficulty using hands for taps, buttons, cutlery, and/or writing: {ACTIONS;DENIES/REPORTS:21021675::"Denies"}   Review of Systems  Constitutional: Negative for fatigue.  HENT: Negative for mouth sores and mouth dryness.   Eyes: Negative for dryness.  Respiratory: Negative for shortness of breath.   Gastrointestinal: Negative for constipation and diarrhea.  Musculoskeletal: Negative for myalgias and myalgias.  Skin: Negative for sensitivity to sunlight.  Psychiatric/Behavioral: Negative for decreased concentration and sleep disturbance.    PMFS History:  Patient Active Problem List   Diagnosis Date Noted  . High risk medication use 12/19/2016  . Baker's cyst of knee, left 12/19/2016  . Pain in both knees 12/19/2016  . Pain of both elbows 12/19/2016  . Malignant melanoma ([place]) 12/19/2016  . Basal cell carcinoma 12/19/2016  . Ascending aortic aneurysm ([place]) 04/26/2016  . Upper airway cough syndrome 04/25/2016  . Hx of adenomatous colonic polyps 06/21/2015  . Hyperlipidemia 11/15/2014  . Rheumatoid arthritis ([place]) 09/01/2014  . Pericarditis 04/24/2011  . Aortic valve disorder 02/14/2009  . ELECTROCARDIOGRAM, ABNORMAL 02/14/2009  . PERNICIOUS ANEMIA  07/10/2007  . CANDIDIASIS, ORAL 07/03/2007  . DEPRESSION 07/03/2007  . MALAISE AND FATIGUE 07/03/2007  . HYPOTHYROIDISM 04/30/2007  . Essential hypertension 04/30/2007  . Aneurysm of thoracic aorta ([place]) 02/13/2006    Past Medical History:  Diagnosis Date  . Adenomatous colon polyp 2012  . Allergy   . Anxiety   . Aortic insufficiency   . Arthritis   . Basal cell carcinoma   . Cataract   . Depression   . Diverticulosis   . Fibromyalgia   . Fibromyalgia   . Hyperlipidemia   . Hypertension   . Macular degeneration   . Melanoma ([place])   . Murmur, heart   . Reflux   . Stroke ([place])    per pt. mild stroke  . Thyroid disease     Family History  Problem Relation Age of Onset  . Heart disease Mother   . Hypertension Mother   . Mental illness Mother   . Allergies Mother   . Heart disease Father   . Hypertension Father   . Mental illness Father   . Emphysema Father     smoked  . Allergies Father   . Heart attack Father   . Hyperlipidemia Sister   . Hypertension Sister   . Allergies Sister   . Hypertension Sister   . Diabetes Sister   . Allergies Sister    Past Surgical History:  Procedure Laterality Date  . ABDOMINAL HYSTERECTOMY    . APPENDECTOMY    . BASAL CELL CARCINOMA EXCISION    . CARDIAC CATHETERIZATION    . CHOLECYSTECTOMY    . COLONOSCOPY     30 + years ago, unsure of where  . EYE SURGERY    . LASIK Bilateral   .  MELANOMA EXCISION    . NASAL SEPTUM SURGERY    . TUBAL LIGATION     Social History   Social History Narrative   Lives alone in a one story home.  Has 2 living children.  Retired Engineer, water.       Objective: Vital Signs: There were no vitals taken for this visit.   Physical Exam  Constitutional: She is oriented to person, place, and time. She appears well-developed and well-nourished.  HENT:  Head: Normocephalic and atraumatic.  Eyes: EOM are normal. Pupils are equal, round, and reactive to light.  Cardiovascular: Normal rate,  regular rhythm and normal heart sounds.  Exam reveals no gallop and no friction rub.   No murmur heard. Pulmonary/Chest: Effort normal and breath sounds normal. She has no wheezes. She has no rales.  Abdominal: Soft. Bowel sounds are normal. She exhibits no distension. There is no tenderness. There is no guarding. No hernia.  Musculoskeletal: Normal range of motion. She exhibits no edema, tenderness or deformity.  Lymphadenopathy:    She has no cervical adenopathy.  Neurological: She is alert and oriented to person, place, and time. Coordination normal.  Skin: Skin is warm and dry. Capillary refill takes less than 2 seconds. No rash noted.  Psychiatric: She has a normal mood and affect. Her behavior is normal.  Nursing note and vitals reviewed.    Musculoskeletal Exam: ***  CDAI Exam: No CDAI exam completed.    Investigation: Findings:  Last labs are from9/25/2017 which include CBC with differential which are normal. We didn't have a CMP on that visit. Her last BMP that we have is from 05/04/2016 which shows BMP to be normal.  Was taking oral methotrexate 8 pills per week but was having inadequate response and we added a ARAVA.  Appointment on 10/15/2016  Component Date Value Ref Range Status  . Rest HR 10/15/2016 76  bpm Final  . Rest BP 10/15/2016 154/95  mmHg Final  . Peak HR 10/15/2016 122  bpm Final  . Peak BP 10/15/2016 189/97  mmHg Final  . SSS 10/15/2016 2   Final  . SRS 10/15/2016 0   Final  . SDS 10/15/2016 2   Final  . LHR 10/15/2016 0.27   Final  . TID 10/15/2016 0.85   Final  . LV sys vol 10/15/2016 37  mL Final  . LV dias vol 10/15/2016 83  46 - 106 mL Final  Office Visit on 07/20/2016  Component Date Value Ref Range Status  . WBC 07/20/2016 8.3  3.8 - 10.8 K/uL Final  . RBC 07/20/2016 4.46  3.80 - 5.10 MIL/uL Final  . Hemoglobin 07/20/2016 13.1  11.7 - 15.5 g/dL Final  . HCT 07/20/2016 38.9  35.0 - 45.0 % Final  . MCV 07/20/2016 87.2  80.0 - 100.0 fL Final   . MCH 07/20/2016 29.4  27.0 - 33.0 pg Final  . MCHC 07/20/2016 33.7  32.0 - 36.0 g/dL Final  . RDW 07/20/2016 15.6* 11.0 - 15.0 % Final  . Platelets 07/20/2016 281  140 - 400 K/uL Final  . MPV 07/20/2016 9.9  7.5 - 12.5 fL Final      Imaging: No results found.  Speciality Comments: No specialty comments available.    Procedures:  No procedures performed Allergies: Tramadol   Assessment / Plan:     Visit Diagnoses: High risk medication use - methotrexate 50 MG/2ML injectionleflunomide (ARAVA) 10 MG tablet  Rheumatoid arthritis involving multiple sites with positive rheumatoid factor (HCC)  Baker's cyst of knee, left  Pain in both knees, unspecified chronicity  Pain of both elbows  Malignant melanoma, unspecified site ([place])  Basal cell carcinoma, unspecified site    Orders: No orders of the defined types were placed in this encounter.  No orders of the defined types were placed in this encounter.   Face-to-face time spent with patient was 30 minutes. 50% of time was spent in counseling and coordination of care.  Follow-Up Instructions: No Follow-up on file.   Eliezer Lofts, PA-C  Note - This record has been created using [employer].  Chart creation errors have been sought, but may not always  have been located. Such creation errors do not reflect on  the standard of medical care.

## 2016-12-20 ENCOUNTER — Ambulatory Visit: Payer: Medicare Other | Admitting: Rheumatology

## 2016-12-24 DIAGNOSIS — Z85828 Personal history of other malignant neoplasm of skin: Secondary | ICD-10-CM | POA: Diagnosis not present

## 2016-12-24 DIAGNOSIS — Z85820 Personal history of malignant melanoma of skin: Secondary | ICD-10-CM | POA: Diagnosis not present

## 2016-12-24 DIAGNOSIS — L720 Epidermal cyst: Secondary | ICD-10-CM | POA: Diagnosis not present

## 2016-12-24 DIAGNOSIS — L821 Other seborrheic keratosis: Secondary | ICD-10-CM | POA: Diagnosis not present

## 2016-12-26 ENCOUNTER — Ambulatory Visit: Payer: Medicare Other | Admitting: Rheumatology

## 2017-01-02 ENCOUNTER — Telehealth: Payer: Self-pay | Admitting: Family Medicine

## 2017-01-02 NOTE — Telephone Encounter (Signed)
Spoke with patient regarding awv. Patient stated that she wants to make appt, but will need to call office back at a later time.

## 2017-01-10 ENCOUNTER — Other Ambulatory Visit: Payer: Self-pay | Admitting: Emergency Medicine

## 2017-01-10 ENCOUNTER — Telehealth: Payer: Self-pay | Admitting: Family Medicine

## 2017-01-10 MED ORDER — CLONAZEPAM 0.5 MG PO TABS: 0.5000 mg | ORAL_TABLET | Freq: Two times a day (BID) | ORAL | 2 refills | Status: DC | PRN

## 2017-01-10 NOTE — Telephone Encounter (Signed)
Received refill request from pt for clonazePAM (KLONOPIN) 0.5 mg tablet. Last office visit 08/16/2016 and last refill 11/22/16. Is it ok to refill? Please advise.

## 2017-01-10 NOTE — Telephone Encounter (Signed)
Self.  Refill request for clonazePAM   Pharmacy:[facility] [place], [place] - [address] AT [address]

## 2017-01-10 NOTE — Telephone Encounter (Signed)
[place]- she is filling her klonopin about once a month, nothing unexpected

## 2017-01-16 DIAGNOSIS — Z79899 Other long term (current) drug therapy: Secondary | ICD-10-CM | POA: Insufficient documentation

## 2017-01-16 NOTE — Progress Notes (Signed)
Office Visit Note  Patient: Katherine Wang             Date of Birth: Sep 17, 1946           MRN: [id number]             PCP: Lamar Blinks, MD Referring: [name], MD Visit Date: 01/18/2017 Occupation: @GUAROCC @    Subjective:  Pain of the Left Knee and Follow-up   History of Present Illness: Katherine Wang is a 71 y.o. female   Patient is currently doing a therapy of methotrexate 0.8 ML's weekly, folic acid 2 mg daily, Arava 10 mg daily. She is getting fair amount of relief with this dual therapy but patient reminds me that she is still having some symptoms and would love to be back on the Biologics.  She states that she found paperwork at her house that proves that she had a diagnosis of basal cell cancer in 2013 (she had originally told us it was diagnosed in 2014). As a result this coming September 2018, she will be 5 years s/p Basal Cell Cancer Diagnosis. She is excited to get back on a biologic if appropriate. We discussed the importance of getting a statement from her doctor stating that she is cancer free and when the actual diagnosis was so that we can give her a biologic if appropriate.    Activities of Daily Living:  Patient reports morning stiffness for 15 minutes.   Patient Reports nocturnal pain.  Difficulty dressing/grooming: Reports Difficulty climbing stairs: Reports Difficulty getting out of chair: Reports Difficulty using hands for taps, buttons, cutlery, and/or writing: Reports   Review of Systems  Constitutional: Negative for fatigue.  HENT: Negative for mouth sores and mouth dryness.   Eyes: Negative for dryness.  Respiratory: Negative for shortness of breath.   Gastrointestinal: Negative for constipation and diarrhea.  Musculoskeletal: Negative for myalgias and myalgias.  Skin: Negative for sensitivity to sunlight.  Psychiatric/Behavioral: Negative for decreased concentration and sleep disturbance.    PMFS History:  Patient Active  Problem List   Diagnosis Date Noted  . High risk medications (not anticoagulants) long-term use 01/16/2017  . High risk medication use 12/19/2016  . Baker's cyst of knee, left 12/19/2016  . Pain in both knees 12/19/2016  . Pain of both elbows 12/19/2016  . Malignant melanoma ([place]) 12/19/2016  . Basal cell carcinoma 12/19/2016  . Ascending aortic aneurysm ([place]) 04/26/2016  . Upper airway cough syndrome 04/25/2016  . Hx of adenomatous colonic polyps 06/21/2015  . Hyperlipidemia 11/15/2014  . Rheumatoid arthritis ([place]) 09/01/2014  . Pericarditis 04/24/2011  . Aortic valve disorder 02/14/2009  . ELECTROCARDIOGRAM, ABNORMAL 02/14/2009  . PERNICIOUS ANEMIA 07/10/2007  . CANDIDIASIS, ORAL 07/03/2007  . DEPRESSION 07/03/2007  . MALAISE AND FATIGUE 07/03/2007  . HYPOTHYROIDISM 04/30/2007  . Essential hypertension 04/30/2007  . Aneurysm of thoracic aorta ([place]) 02/13/2006    Past Medical History:  Diagnosis Date  . Adenomatous colon polyp 2012  . Allergy   . Anxiety   . Aortic insufficiency   . Arthritis   . Basal cell carcinoma   . Cataract   . Depression   . Diverticulosis   . Fibromyalgia   . Fibromyalgia   . Hyperlipidemia   . Hypertension   . Macular degeneration   . Melanoma ([place])   . Murmur, heart   . Reflux   . Stroke ([place])    per pt. mild stroke  . Thyroid disease     Family History  Problem Relation Age of Onset  . Heart disease Mother   . Hypertension Mother   . Mental illness Mother   . Allergies Mother   . Heart disease Father   . Hypertension Father   . Mental illness Father   . Emphysema Father     smoked  . Allergies Father   . Heart attack Father   . Hyperlipidemia Sister   . Hypertension Sister   . Allergies Sister   . Hypertension Sister   . Diabetes Sister   . Allergies Sister    Past Surgical History:  Procedure Laterality Date  . ABDOMINAL HYSTERECTOMY    . APPENDECTOMY    . BASAL CELL CARCINOMA EXCISION    . CARDIAC CATHETERIZATION     . CHOLECYSTECTOMY    . COLONOSCOPY     30 + years ago, unsure of where  . EYE SURGERY    . LASIK Bilateral   . MELANOMA EXCISION    . NASAL SEPTUM SURGERY    . TUBAL LIGATION     Social History   Social History Narrative   Lives alone in a one story home.  Has 2 living children.  Retired Engineer, water.       Objective: Vital Signs: BP 120/74   Pulse 78   Resp 16   Wt 154 lb (69.9 kg)   BMI 30.08 kg/m    Physical Exam  Constitutional: She is oriented to person, place, and time. She appears well-developed and well-nourished.  HENT:  Head: Normocephalic and atraumatic.  Eyes: EOM are normal. Pupils are equal, round, and reactive to light.  Cardiovascular: Normal rate, regular rhythm and normal heart sounds.  Exam reveals no gallop and no friction rub.   No murmur heard. Pulmonary/Chest: Effort normal and breath sounds normal. She has no wheezes. She has no rales.  Abdominal: Soft. Bowel sounds are normal. She exhibits no distension. There is no tenderness. There is no guarding. No hernia.  Musculoskeletal: Normal range of motion. She exhibits no edema, tenderness or deformity.  Lymphadenopathy:    She has no cervical adenopathy.  Neurological: She is alert and oriented to person, place, and time. Coordination normal.  Skin: Skin is warm and dry. Capillary refill takes less than 2 seconds. No rash noted.  Psychiatric: She has a normal mood and affect. Her behavior is normal.  Nursing note and vitals reviewed.   Bilateral knees are not warm no effusion or tenderness. Dorsal spurs on bilateral feet. Ankles are tender. .  Musculoskeletal Exam:  Full range of motion of all joints Grip strength is equal and strong bilaterally Fiber myalgia tender points are all absent  CDAI Exam: CDAI Homunculus Exam:   Tenderness:  Right hand: 2nd MCP, 1st PIP, 2nd PIP, 3rd PIP, 4th PIP and 5th PIP Left hand: 2nd MCP, 1st PIP, 2nd PIP, 3rd PIP, 4th PIP and 5th PIP  Joint Counts:    CDAI Tender Joint count: 12 CDAI Swollen Joint count: 0  Global Assessments:  Patient Global Assessment: 3 Provider Global Assessment: 3  CDAI Calculated Score: 18    Investigation: Findings:  Last labs are from9/25/2017 which include CBC with differential which are normal We didn't have a CMP on that visit. Her last BMP that we have is from 05/04/2016 which shows BMP to be normal  Appointment on 10/15/2016  Component Date Value Ref Range Status  . Rest HR 10/15/2016 76  bpm Final  . Rest BP 10/15/2016 154/95  mmHg Final  . Peak HR  10/15/2016 122  bpm Final  . Peak BP 10/15/2016 189/97  mmHg Final  . SSS 10/15/2016 2   Final  . SRS 10/15/2016 0   Final  . SDS 10/15/2016 2   Final  . LHR 10/15/2016 0.27   Final  . TID 10/15/2016 0.85   Final  . LV sys vol 10/15/2016 37  mL Final  . LV dias vol 10/15/2016 83  46 - 106 mL Final      Imaging: No results found.  Speciality Comments: No specialty comments available.    Procedures:  No procedures performed Allergies: Tramadol   Assessment / Plan:     Visit Diagnoses: Rheumatoid arthritis involving multiple sites with positive rheumatoid factor (HCC)  High risk medications (not anticoagulants) long-term use - MTX-50mg /2ML injection,Inject 0.34mL sub q once a weekArava- 10MG  tablet 1 tablet (10 mg total) by mouth daily - Plan: Comprehensive metabolic panel, CBC with Differential/Platelet  Fibromyalgia  Fatigue, unspecified type - Plan: VITAMIN D 25 Hydroxy (Vit-D Deficiency, Fractures)  Baker's cyst of knee, left - 01/18/2017: Resolved per examination.  Pain in both knees, unspecified chronicity  Pain of both elbows  Malignant melanoma, unspecified site (HCC)  Basal cell carcinoma, unspecified site  High risk medications (not anticoagulants) long-term use - Plan: Comprehensive metabolic panel, CBC with Differential/Platelet   Plan:  #1: Rheumatoid arthritis. Patient does have some active disease but fairly  controlled well with methotrexate 0.8 ML 0 week, folic acid 2 mg every day, Arava 10 mg every day. Note that due to the basal cell carcinoma history diagnosed in 2013 (this is new information and we thought it was 2014 but patient clarified today and states that's 2013) we can actually restart the patient on biologic since she still having some activity of tenderness and synovial thickening to some joints.  #2: High-risk prescription. Methotrexate 0.8 ML's per week Folic acid 2 mg every day Arava 10 mg every day. We will refill all of these medications for the patient today. Plan: I would like to add the patient back on a biologic in the near future when we get an okay from her cancer doctor that she is cancer free. We do need documentation that it's been 5 years but patient states verbally that she has found paperwork that shows that and she will bring it to our office for documentation.  #3: cbc w/ diff &  cmp w/ gfr  & vit d 25-oh  #4: Patient's last bone density check was approximately 4 years ago and it was done in [place]. She feels that she might be able to get the records from [place] but she is not sure. She does recall that the diagnosis made at that time was osteopenia. Since it's been 4 years and she hasn't had one repeated, I suggested that we need to have that redone so we can see if she's gotten better or worse and treat her appropriately. Patient is agreeable so I will go ahead and make a referral for bone density test at [facility] on [address] for the patient to get it done soon. In the meanwhile I would still like to have the old records and patient will work on getting that to [place].  #5: Return to clinic in 5 months from today (August 2018) and at that time she'll have the documentation necessary to enter into her records and at that visit I'll ask her to get an approval from her doctor that she is cancer free and then we would  like to discuss possible treatment option adding the  biologic. Of course she may not need the methotrexate or Arava if the biologic is able to take care of her needs and we will have that discussion at that time  #6: Patient has ongoing fibromyalgia and it bothers her to some degree. She describes the pain saw her between 3-6 on a scale of 0-10.  Orders: Orders Placed This Encounter  Procedures  . VITAMIN D 25 Hydroxy (Vit-D Deficiency, Fractures)   Meds ordered this encounter  Medications  . methotrexate 50 MG/2ML injection    Sig: Inject 0.34mL sub q once a week    Dispense:  10 mL    Refill:  0    Dispense mtx with preservatives (4ml x 5 vials w/ preservatives)    Order Specific Question:   Supervising Provider    Answer:   Lyda Perone  . folic acid (FOLVITE) 1 MG tablet    Sig: Take 2 tablets (2 mg total) by mouth daily.    Dispense:  180 tablet    Refill:  4    Order Specific Question:   Supervising Provider    Answer:   Bo Merino [[id number]]  . leflunomide (ARAVA) 10 MG tablet    Sig: Take 1 tablet (10 mg total) by mouth daily.    Dispense:  90 tablet    Refill:  0    Order Specific Question:   Supervising Provider    Answer:   Bo Merino [phone]    Face-to-face time spent with patient was 30 minutes. 50% of time was spent in counseling and coordination of care.  Follow-Up Instructions: No Follow-up on file.   Kylor Valverde, PA-C  I examined patient today and she had minimal synovitis and synovial thickening. I examined and evaluated the patient with Eliezer Lofts PA. The plan of care was discussed as noted above.  Bo Merino, MD Note - This record has been created using Editor, commissioning.  Chart creation errors have been sought, but may not always  have been located. Such creation errors do not reflect on  the standard of medical care.

## 2017-01-18 ENCOUNTER — Encounter: Payer: Self-pay | Admitting: Rheumatology

## 2017-01-18 ENCOUNTER — Ambulatory Visit (INDEPENDENT_AMBULATORY_CARE_PROVIDER_SITE_OTHER): Payer: Medicare Other | Admitting: Rheumatology

## 2017-01-18 VITALS — BP 120/74 | HR 78 | Resp 16 | Wt 154.0 lb

## 2017-01-18 DIAGNOSIS — C439 Malignant melanoma of skin, unspecified: Secondary | ICD-10-CM | POA: Diagnosis not present

## 2017-01-18 DIAGNOSIS — M7122 Synovial cyst of popliteal space [Baker], left knee: Secondary | ICD-10-CM

## 2017-01-18 DIAGNOSIS — M797 Fibromyalgia: Secondary | ICD-10-CM

## 2017-01-18 DIAGNOSIS — M25561 Pain in right knee: Secondary | ICD-10-CM

## 2017-01-18 DIAGNOSIS — Z79899 Other long term (current) drug therapy: Secondary | ICD-10-CM

## 2017-01-18 DIAGNOSIS — C4491 Basal cell carcinoma of skin, unspecified: Secondary | ICD-10-CM | POA: Diagnosis not present

## 2017-01-18 DIAGNOSIS — M25522 Pain in left elbow: Secondary | ICD-10-CM | POA: Diagnosis not present

## 2017-01-18 DIAGNOSIS — M0579 Rheumatoid arthritis with rheumatoid factor of multiple sites without organ or systems involvement: Secondary | ICD-10-CM

## 2017-01-18 DIAGNOSIS — M25521 Pain in right elbow: Secondary | ICD-10-CM

## 2017-01-18 DIAGNOSIS — R5383 Other fatigue: Secondary | ICD-10-CM | POA: Diagnosis not present

## 2017-01-18 DIAGNOSIS — M25562 Pain in left knee: Secondary | ICD-10-CM

## 2017-01-18 LAB — CBC WITH DIFFERENTIAL/PLATELET
Basophils Absolute: 0 cells/uL (ref 0–200)
Basophils Relative: 0 %
EOS ABS: 110 {cells}/uL (ref 15–500)
Eosinophils Relative: 2 %
HEMATOCRIT: 41.3 % (ref 35.0–45.0)
Hemoglobin: 13.7 g/dL (ref 11.7–15.5)
LYMPHS PCT: 22 %
Lymphs Abs: 1210 cells/uL (ref 850–3900)
MCH: 30 pg (ref 27.0–33.0)
MCHC: 33.2 g/dL (ref 32.0–36.0)
MCV: 90.6 fL (ref 80.0–100.0)
MONO ABS: 385 {cells}/uL (ref 200–950)
MPV: 9.6 fL (ref 7.5–12.5)
Monocytes Relative: 7 %
NEUTROS PCT: 69 %
Neutro Abs: 3795 cells/uL (ref 1500–7800)
Platelets: 307 10*3/uL (ref 140–400)
RBC: 4.56 MIL/uL (ref 3.80–5.10)
RDW: 17.2 % — ABNORMAL HIGH (ref 11.0–15.0)
WBC: 5.5 10*3/uL (ref 3.8–10.8)

## 2017-01-18 LAB — COMPREHENSIVE METABOLIC PANEL
ALT: 29 U/L (ref 6–29)
AST: 29 U/L (ref 10–35)
Albumin: 4 g/dL (ref 3.6–5.1)
Alkaline Phosphatase: 87 U/L (ref 33–130)
BUN: 13 mg/dL (ref 7–25)
CHLORIDE: 103 mmol/L (ref 98–110)
CO2: 28 mmol/L (ref 20–31)
Calcium: 9.2 mg/dL (ref 8.6–10.4)
Creat: 0.77 mg/dL (ref 0.60–0.93)
GLUCOSE: 90 mg/dL (ref 65–99)
Potassium: 4.3 mmol/L (ref 3.5–5.3)
SODIUM: 138 mmol/L (ref 135–146)
Total Bilirubin: 0.6 mg/dL (ref 0.2–1.2)
Total Protein: 6.5 g/dL (ref 6.1–8.1)

## 2017-01-18 MED ORDER — METHOTREXATE SODIUM CHEMO INJECTION 50 MG/2ML: INTRAMUSCULAR | 0 refills | Status: DC

## 2017-01-18 MED ORDER — FOLIC ACID 1 MG PO TABS: 2.0000 mg | ORAL_TABLET | Freq: Every day | ORAL | 4 refills | Status: DC

## 2017-01-18 MED ORDER — LEFLUNOMIDE 10 MG PO TABS: 10.0000 mg | ORAL_TABLET | Freq: Every day | ORAL | 0 refills | Status: DC

## 2017-01-19 LAB — VITAMIN D 25 HYDROXY (VIT D DEFICIENCY, FRACTURES): VIT D 25 HYDROXY: 24 ng/mL — AB (ref 30–100)

## 2017-01-21 DIAGNOSIS — H353132 Nonexudative age-related macular degeneration, bilateral, intermediate dry stage: Secondary | ICD-10-CM | POA: Diagnosis not present

## 2017-01-22 ENCOUNTER — Telehealth: Payer: Self-pay | Admitting: *Deleted

## 2017-01-22 MED ORDER — VITAMIN D (ERGOCALCIFEROL) 1.25 MG (50000 UNIT) PO CAPS: 50000.0000 [IU] | ORAL_CAPSULE | ORAL | 0 refills | Status: DC

## 2017-01-22 NOTE — Telephone Encounter (Signed)
-----   Message from [place], [place] sent at 01/21/2017 12:33 PM EDT ----- I have ordered labs to PCP And tell patient #1: CMP with GFR is within normal limits #2: CBC with differential is within normal limits except for mild elevation of 17.2 #3: Vitamin D is low at 24. Treatment patient as follows: Vitamin D3 50,000 IUs once a week; dispense 12 pills with no refill Recheck vitamin D 25 OH in 3 months

## 2017-02-06 ENCOUNTER — Other Ambulatory Visit: Payer: Self-pay | Admitting: Family Medicine

## 2017-02-11 ENCOUNTER — Other Ambulatory Visit: Payer: Self-pay | Admitting: Family Medicine

## 2017-02-17 NOTE — Progress Notes (Signed)
[facility] at [facility] [address] [place], [place] [address] [phone]  Date:  02/18/2017   Name:  Katherine Wang   DOB:  05/02/1946   MRN:  [id number]  PCP:  Lamar Blinks, MD    Chief Complaint: Annual Exam   History of Present Illness:  Katherine Wang is a 71 y.o. very pleasant female patient who presents with the following:  Here today for her physical exam History of RA, hyperlipidemia Last mammo about 2 years ago- she plans to have done soon She declines a tetanus shot today Pneumovax, date uncertain- we think this was done at [facility].  Will try to get records for her Her last colonoscopy was in 2012 She has noted a lot of gas recently after eating - also her bowels have changed. She may have 4-5 small stools a day over the last 3 weeks.  No vomiting She has felt nauseated a few times but attributes this to her medications   Her RA is managed by Estanisl[facility] Pandy- she is on methotrexate and arava- they maybe able to change her to remicade in the next few months.  She is hopeful that this medication will work well for her She has more pain with her right foot and right hand.   No recent medication change that she can recall- except for added vitamin D She sees dermatology for history of skin cancer- Dr. Fontaine No.  She will see her this month  She is on atorvastatin for her cholestenol her numbers looked great last year. She has some body aches and would like to try cutting back on her atorvastatin dose to see if it might help.  This is fine with me  She does need a TSH level soon  Patient Active Problem List   Diagnosis Date Noted  . High risk medications (not anticoagulants) long-term use 01/16/2017  . Baker's cyst of knee, left 12/19/2016  . Pain in both knees 12/19/2016  . Pain of both elbows 12/19/2016  . Malignant melanoma ([place]) 12/19/2016  . Basal cell carcinoma 12/19/2016  . Ascending aortic aneurysm ([place])  04/26/2016  . Upper airway cough syndrome 04/25/2016  . Hx of adenomatous colonic polyps 06/21/2015  . Hyperlipidemia 11/15/2014  . Rheumatoid arthritis ([place]) 09/01/2014  . Pericarditis 04/24/2011  . Aortic valve disorder 02/14/2009  . ELECTROCARDIOGRAM, ABNORMAL 02/14/2009  . PERNICIOUS ANEMIA 07/10/2007  . DEPRESSION 07/03/2007  . MALAISE AND FATIGUE 07/03/2007  . HYPOTHYROIDISM 04/30/2007  . Essential hypertension 04/30/2007  . Aneurysm of thoracic aorta ([place]) 02/13/2006    Past Medical History:  Diagnosis Date  . Adenomatous colon polyp 2012  . Allergy   . Anxiety   . Aortic insufficiency   . Arthritis   . Basal cell carcinoma   . Cataract   . Depression   . Diverticulosis   . Fibromyalgia   . Fibromyalgia   . Hyperlipidemia   . Hypertension   . Macular degeneration   . Melanoma ([place])   . Murmur, heart   . Reflux   . Stroke ([place])    per pt. mild stroke  . Thyroid disease     Past Surgical History:  Procedure Laterality Date  . ABDOMINAL HYSTERECTOMY    . APPENDECTOMY    . BASAL CELL CARCINOMA EXCISION    . CARDIAC CATHETERIZATION    . CHOLECYSTECTOMY    . COLONOSCOPY     30 + years ago, unsure of where  . EYE  SURGERY    . LASIK Bilateral   . MELANOMA EXCISION    . NASAL SEPTUM SURGERY    . TUBAL LIGATION      Social History  Substance Use Topics  . Smoking status: Former Smoker    Packs/day: 2.00    Years: 20.00    Types: Cigarettes    Quit date: 10/30/1983  . Smokeless tobacco: Never Used  . Alcohol use No    Family History  Problem Relation Age of Onset  . Heart disease Mother   . Hypertension Mother   . Mental illness Mother   . Allergies Mother   . Heart disease Father   . Hypertension Father   . Mental illness Father   . Emphysema Father     smoked  . Allergies Father   . Heart attack Father   . Hyperlipidemia Sister   . Hypertension Sister   . Allergies Sister   . Hypertension Sister   . Diabetes Sister   . Allergies Sister      Allergies  Allergen Reactions  . Tramadol Other (See Comments)    dizzy    Medication list has been reviewed and updated.  Current Outpatient Prescriptions on File Prior to Visit  Medication Sig Dispense Refill  . albuterol (PROVENTIL HFA;VENTOLIN HFA) 108 (90 Base) MCG/ACT inhaler Inhale 2 puffs into the lungs every 6 (six) hours as needed for wheezing or shortness of breath. 18 g 2  . aspirin EC 81 MG tablet Take 1 tablet (81 mg total) by mouth daily. 90 tablet 3  . clonazePAM (KLONOPIN) 0.5 MG tablet Take 1 tablet (0.5 mg total) by mouth 2 (two) times daily as needed for anxiety. 30 tablet 2  . Cyanocobalamin (VITAMIN B 12 PO) Take 1 tablet by mouth daily.    . cycloSPORINE (RESTASIS) 0.05 % ophthalmic emulsion 1 drop 2 (two) times daily.    . famotidine (PEPCID) 20 MG tablet One at bedtime 30 tablet 2  . fluticasone (FLONASE) 50 MCG/ACT nasal spray Place 2 sprays into both nostrils daily. 16 g 5  . folic acid (FOLVITE) 1 MG tablet Take 2 tablets (2 mg total) by mouth daily. 180 tablet 4  . ibuprofen (ADVIL,MOTRIN) 600 MG tablet One tablet three times daily x 3 weeks then stop 63 tablet 0  . leflunomide (ARAVA) 10 MG tablet Take 1 tablet (10 mg total) by mouth daily. 90 tablet 0  . methotrexate 50 MG/2ML injection Inject 0.70m sub q once a week 10 mL 0  . Multiple Vitamins-Minerals (PRESERVISION AREDS PO) Take by mouth. Reported on 02/15/2016    . naproxen sodium (ANAPROX) 220 MG tablet Take 220 mg by mouth as needed (PAIN).     .Marland KitchenRespiratory Therapy Supplies (FLUTTER) DEVI Use as directed 1 each 0  . Tuberculin-Allergy Syringes 27G X 1/2" 1 ML KIT Inject 1 Syringe into the skin once a week. If syringe size greater than 139m please call me at my office. 12 each 4  . Vitamin D, Ergocalciferol, (DRISDOL) 50000 units CAPS capsule Take 1 capsule (50,000 Units total) by mouth every 7 (seven) days. 12 capsule 0   No current facility-administered medications on file prior to visit.      Review of Systems:  As per HPI- otherwise negative.   Physical Examination: Vitals:   02/18/17 1317  BP: (!) 142/84  Pulse: 86  Temp: 98.1 F (36.7 C)   Vitals:   02/18/17 1317  Weight: 150 lb 12.8 oz (68.4 kg)  Height:  _0  (1.651 m)   Body mass index is 25.09 kg/m. Ideal Body Weight: Weight in (lb) to have BMI = 25: 149.9  GEN: WDWN, NAD, Non-toxic, A & O x 3, looks well, normal weight HEENT: Atraumatic, Normocephalic. Neck supple. No masses, No LAD.  Bilateral TM wnl, oropharynx normal.  PEERL,EOMI.   Ears and Nose: No external deformity. CV: RRR, No M/G/R. No JVD. No thrill. No extra heart sounds. PULM: CTA B, no wheezes, crackles, rhonchi. No retractions. No resp. distress. No accessory muscle use. ABD: S, NT, ND. No rebound. No HSM. EXTR: No c/c/e NEURO Normal gait.  PSYCH: Normally interactive. Conversant. Not depressed or anxious appearing.  Calm demeanor.    Assessment and Plan: Physical exam  Essential hypertension - Plan: valsartan (DIOVAN) 160 MG tablet  Rheumatoid arthritis, involving unspecified site, unspecified rheumatoid factor presence ([place])  Breast cancer screening  Hypothyroidism due to acquired atrophy of thyroid - Plan: SYNTHROID 100 MCG tablet, TSH  Mixed hyperlipidemia - Plan: atorvastatin (LIPITOR) 40 MG tablet, Lipid panel  Body aches  Depression, unspecified depression type - Plan: sertraline (ZOLOFT) 100 MG tablet  Screening for breast cancer - Plan: MM SCREENING BREAST TOMO BILATERAL  Here today for a CPE Ordered mammogram, she plans to contact her GI doctor soon Mood sx are under good control RA managed by rheumatology She will try decreasing her lipitor dose and will come in for labs - thyroid and cholesterol- in about 3 months.   Refilled medications    Signed Lamar Blinks, MD

## 2017-02-18 ENCOUNTER — Ambulatory Visit (INDEPENDENT_AMBULATORY_CARE_PROVIDER_SITE_OTHER): Payer: Medicare Other | Admitting: Family Medicine

## 2017-02-18 ENCOUNTER — Encounter: Payer: Self-pay | Admitting: Family Medicine

## 2017-02-18 VITALS — BP 142/84 | HR 86 | Temp 98.1°F | Ht 65.0 in | Wt 150.8 lb

## 2017-02-18 DIAGNOSIS — I10 Essential (primary) hypertension: Secondary | ICD-10-CM | POA: Diagnosis not present

## 2017-02-18 DIAGNOSIS — R52 Pain, unspecified: Secondary | ICD-10-CM

## 2017-02-18 DIAGNOSIS — E782 Mixed hyperlipidemia: Secondary | ICD-10-CM

## 2017-02-18 DIAGNOSIS — E034 Atrophy of thyroid (acquired): Secondary | ICD-10-CM

## 2017-02-18 DIAGNOSIS — F32A Depression, unspecified: Secondary | ICD-10-CM

## 2017-02-18 DIAGNOSIS — Z0000 Encounter for general adult medical examination without abnormal findings: Secondary | ICD-10-CM | POA: Diagnosis not present

## 2017-02-18 DIAGNOSIS — F329 Major depressive disorder, single episode, unspecified: Secondary | ICD-10-CM

## 2017-02-18 DIAGNOSIS — Z1231 Encounter for screening mammogram for malignant neoplasm of breast: Secondary | ICD-10-CM | POA: Diagnosis not present

## 2017-02-18 DIAGNOSIS — Z1239 Encounter for other screening for malignant neoplasm of breast: Secondary | ICD-10-CM

## 2017-02-18 DIAGNOSIS — M069 Rheumatoid arthritis, unspecified: Secondary | ICD-10-CM | POA: Diagnosis not present

## 2017-02-18 MED ORDER — SYNTHROID 100 MCG PO TABS: ORAL_TABLET | ORAL | 3 refills | Status: DC

## 2017-02-18 MED ORDER — SERTRALINE HCL 100 MG PO TABS: 200.0000 mg | ORAL_TABLET | Freq: Every day | ORAL | 3 refills | Status: DC

## 2017-02-18 MED ORDER — ATORVASTATIN CALCIUM 40 MG PO TABS: 40.0000 mg | ORAL_TABLET | Freq: Every day | ORAL | 3 refills | Status: DC

## 2017-02-18 MED ORDER — VALSARTAN 160 MG PO TABS: 160.0000 mg | ORAL_TABLET | Freq: Two times a day (BID) | ORAL | 3 refills | Status: DC

## 2017-02-18 NOTE — Patient Instructions (Addendum)
It was nice to see you today- please do go ahead and schedule a mammogram and to see Dr. Fuller Plan at [facility] Phone: [phone]  Decrease your lipitor by 1/2 - we will see if this will help with your body aches. Please come in for labs only in about 3 months and we will see how your cholesterol and thyroid are looking

## 2017-02-20 ENCOUNTER — Encounter: Payer: Self-pay | Admitting: Family Medicine

## 2017-02-20 DIAGNOSIS — R058 Other specified cough: Secondary | ICD-10-CM

## 2017-02-20 DIAGNOSIS — R05 Cough: Secondary | ICD-10-CM

## 2017-02-20 MED ORDER — FAMOTIDINE 20 MG PO TABS: 20.0000 mg | ORAL_TABLET | Freq: Every day | ORAL | 5 refills | Status: DC

## 2017-02-26 ENCOUNTER — Encounter: Payer: Self-pay | Admitting: Gastroenterology

## 2017-03-01 ENCOUNTER — Ambulatory Visit: Payer: Medicare Other | Admitting: *Deleted

## 2017-03-01 VITALS — Ht 59.5 in | Wt 154.0 lb

## 2017-03-01 DIAGNOSIS — Z86010 Personal history of colonic polyps: Secondary | ICD-10-CM

## 2017-03-01 MED ORDER — NA SULFATE-K SULFATE-MG SULF 17.5-3.13-1.6 GM/177ML PO SOLN: 1.0000 | Freq: Once | ORAL | 0 refills | Status: AC

## 2017-03-01 NOTE — Progress Notes (Signed)
Denies allergies to eggs or soy products. Denies complications with sedation or anesthesia. Denies O2 use. Denies use of diet or weight loss medications.  Emmi instructions given for colonoscopy.  

## 2017-03-03 ENCOUNTER — Encounter: Payer: Self-pay | Admitting: Family Medicine

## 2017-03-06 ENCOUNTER — Other Ambulatory Visit: Payer: Self-pay | Admitting: Family Medicine

## 2017-03-11 ENCOUNTER — Telehealth: Payer: Self-pay | Admitting: Gastroenterology

## 2017-03-11 NOTE — Telephone Encounter (Signed)
No charge this time. 

## 2017-03-12 ENCOUNTER — Encounter: Payer: Self-pay | Admitting: Gastroenterology

## 2017-03-13 ENCOUNTER — Ambulatory Visit (HOSPITAL_BASED_OUTPATIENT_CLINIC_OR_DEPARTMENT_OTHER)
Admission: RE | Admit: 2017-03-13 | Discharge: 2017-03-13 | Disposition: A | Discharge status: Home / Self Care | Payer: Medicare Other | Source: Ambulatory Visit | Attending: Family Medicine | Admitting: Family Medicine

## 2017-03-13 ENCOUNTER — Ambulatory Visit (INDEPENDENT_AMBULATORY_CARE_PROVIDER_SITE_OTHER): Payer: Medicare Other | Admitting: Family Medicine

## 2017-03-13 ENCOUNTER — Encounter: Payer: Self-pay | Admitting: Family Medicine

## 2017-03-13 VITALS — BP 124/82 | HR 87 | Temp 98.4°F | Ht 65.0 in | Wt 152.2 lb

## 2017-03-13 DIAGNOSIS — I700 Atherosclerosis of aorta: Secondary | ICD-10-CM | POA: Insufficient documentation

## 2017-03-13 DIAGNOSIS — R509 Fever, unspecified: Secondary | ICD-10-CM

## 2017-03-13 DIAGNOSIS — R05 Cough: Secondary | ICD-10-CM | POA: Diagnosis not present

## 2017-03-13 DIAGNOSIS — K0510 Chronic gingivitis, plaque induced: Secondary | ICD-10-CM | POA: Diagnosis not present

## 2017-03-13 DIAGNOSIS — J9811 Atelectasis: Secondary | ICD-10-CM | POA: Diagnosis not present

## 2017-03-13 DIAGNOSIS — R059 Cough, unspecified: Secondary | ICD-10-CM

## 2017-03-13 MED ORDER — CEFDINIR 300 MG PO CAPS: 300.0000 mg | ORAL_CAPSULE | Freq: Two times a day (BID) | ORAL | 0 refills | Status: DC

## 2017-03-13 NOTE — Progress Notes (Signed)
[place] at [facility] [address], [place], [place] [address] [phone]  Date:  03/13/2017   Name:  Katherine Wang   DOB:  06-Sep-1946   MRN:  [id number]  PCP:  [name], MD    Chief Complaint: Cough (c/o prod cough with occ clear mucus and bilateral rib pain. Pt states that her temp has been between 100.4 to 100.6 for the past 4-5 days. )   History of Present Illness:  Katherine Wang is a 71 y.o. very pleasant female patient who presents with the following:  Today is Wednesday. She has noted a low grade fever off and on and a cough since this past Friday/ Saturday. She will have some pain in her ribs when she coughs.  She is blowing some bloody mucus from her sinuses.  Tmax approx 100.6  She does not have dental insurance and does not go regularly to her dentist.  She had noted some mouth ulcers for a few days as well, coinciding with her other sx.  She also notes that her gums seem to be inflamed and sore around her native teeth (she has about 6 of her own teeth- the bottom incisions- the rest are dentures)  She quit smoking in the 1980s.    Dermatology- she sees Marcine Matar.  She gets regular check ups  Her cough is occasionally productive of clear mucus.   She has noted some mild PND and slight ST No nausea, vomiting or diarrhea She is not taking any fever reducing medications   Patient Active Problem List   Diagnosis Date Noted  . High risk medications (not anticoagulants) long-term use 01/16/2017  . Baker's cyst of knee, left 12/19/2016  . Pain in both knees 12/19/2016  . Pain of both elbows 12/19/2016  . Malignant melanoma ([place]) 12/19/2016  . Basal cell carcinoma 12/19/2016  . Ascending aortic aneurysm ([place]) 04/26/2016  . Upper airway cough syndrome 04/25/2016  . Hx of adenomatous colonic polyps 06/21/2015  . Hyperlipidemia 11/15/2014  . Rheumatoid arthritis ([place]) 09/01/2014  . Pericarditis  04/24/2011  . Aortic valve disorder 02/14/2009  . ELECTROCARDIOGRAM, ABNORMAL 02/14/2009  . PERNICIOUS ANEMIA 07/10/2007  . DEPRESSION 07/03/2007  . MALAISE AND FATIGUE 07/03/2007  . HYPOTHYROIDISM 04/30/2007  . Essential hypertension 04/30/2007  . Aneurysm of thoracic aorta ([place]) 02/13/2006    Past Medical History:  Diagnosis Date  . Adenomatous colon polyp 2012  . Allergy   . Anxiety   . Aortic insufficiency   . Arthritis   . Basal cell carcinoma   . Cataract   . Depression   . Diverticulosis   . Fibromyalgia   . Fibromyalgia   . Hyperlipidemia   . Hypertension   . Macular degeneration   . Melanoma ([place])   . Murmur, heart   . Reflux   . Stroke ([place])    per pt. mild stroke  . Thyroid disease     Past Surgical History:  Procedure Laterality Date  . ABDOMINAL HYSTERECTOMY    . APPENDECTOMY    . BASAL CELL CARCINOMA EXCISION    . CARDIAC CATHETERIZATION    . CHOLECYSTECTOMY    . COLONOSCOPY     30 + years ago, unsure of where  . EYE SURGERY    . LASIK Bilateral   . MELANOMA EXCISION    . NASAL SEPTUM SURGERY    . TUBAL LIGATION      Social History  Substance  Use Topics  . Smoking status: Former Smoker    Packs/day: 2.00    Years: 20.00    Types: Cigarettes    Quit date: 10/30/1983  . Smokeless tobacco: Never Used  . Alcohol use No    Family History  Problem Relation Age of Onset  . Heart disease Mother   . Hypertension Mother   . Mental illness Mother   . Allergies Mother   . Heart disease Father   . Hypertension Father   . Mental illness Father   . Emphysema Father        smoked  . Allergies Father   . Heart attack Father   . Hyperlipidemia Sister   . Hypertension Sister   . Allergies Sister   . Hypertension Sister   . Diabetes Sister   . Allergies Sister   . Colon cancer Neg Hx   . Esophageal cancer Neg Hx   . Rectal cancer Neg Hx   . Stomach cancer Neg Hx     Allergies  Allergen Reactions  . Tramadol Other (See Comments)     dizzy    Medication list has been reviewed and updated.  Current Outpatient Prescriptions on File Prior to Visit  Medication Sig Dispense Refill  . albuterol (PROVENTIL HFA;VENTOLIN HFA) 108 (90 Base) MCG/ACT inhaler Inhale 2 puffs into the lungs every 6 (six) hours as needed for wheezing or shortness of breath. 18 g 2  . aspirin EC 81 MG tablet Take 1 tablet (81 mg total) by mouth daily. 90 tablet 3  . atorvastatin (LIPITOR) 40 MG tablet Take 1 tablet (40 mg total) by mouth daily. 90 tablet 3  . clonazePAM (KLONOPIN) 0.5 MG tablet Take 1 tablet (0.5 mg total) by mouth 2 (two) times daily as needed for anxiety. 30 tablet 2  . Cyanocobalamin (VITAMIN B 12 PO) Take 1 tablet by mouth daily.    . cycloSPORINE (RESTASIS) 0.05 % ophthalmic emulsion 1 drop 2 (two) times daily.    . famotidine (PEPCID) 20 MG tablet Take 1 tablet (20 mg total) by mouth at bedtime. 30 tablet 5  . fluticasone (FLONASE) 50 MCG/ACT nasal spray Place 2 sprays into both nostrils daily. 16 g 5  . folic acid (FOLVITE) 1 MG tablet Take 2 tablets (2 mg total) by mouth daily. 180 tablet 4  . ibuprofen (ADVIL,MOTRIN) 600 MG tablet One tablet three times daily x 3 weeks then stop 63 tablet 0  . leflunomide (ARAVA) 10 MG tablet Take 1 tablet (10 mg total) by mouth daily. 90 tablet 0  . methotrexate 50 MG/2ML injection Inject 0.74mL sub q once a week 10 mL 0  . Multiple Vitamins-Minerals (PRESERVISION AREDS PO) Take by mouth. Reported on 02/15/2016    . Respiratory Therapy Supplies (FLUTTER) DEVI Use as directed 1 each 0  . sertraline (ZOLOFT) 100 MG tablet Take 2 tablets (200 mg total) by mouth daily. 180 tablet 3  . SYNTHROID 100 MCG tablet TAKE 1 TABLET BY MOUTH DAILY BEFORE BREAKFAST 90 tablet 3  . valsartan (DIOVAN) 160 MG tablet Take 1 tablet (160 mg total) by mouth 2 (two) times daily. 180 tablet 3  . Vitamin D, Ergocalciferol, (DRISDOL) 50000 units CAPS capsule Take 1 capsule (50,000 Units total) by mouth every 7 (seven) days.  12 capsule 0   No current facility-administered medications on file prior to visit.     Review of Systems:  As per HPI- otherwise negative.    Physical Examination: Vitals:   03/13/17 1309  BP: 124/82  Pulse: 87  Temp: 98.4 F (36.9 C)   Vitals:   03/13/17 1309  Weight: 152 lb 3.2 oz (69 kg)  Height: 5\' 5"  (1.651 m)   Body mass index is 25.33 kg/m. Ideal Body Weight: Weight in (lb) to have BMI = 25: 149.9  GEN: WDWN, NAD, Non-toxic, A & O x 3, looks well and her normal self HEENT: Atraumatic, Normocephalic. Neck supple. No masses, No LAD.  Bilateral TM wnl, oropharynx normal.  PEERL,EOMI.   She has upper dentures on top and a partial on the bottom.  The gums around her native teeth appear red and inflamed.  The teeth are in need of cleaning and do not appear in good repair.  .   No ulcers or other mouth lesions noted  Ears and Nose: No external deformity. CV: RRR, No M/G/R. No JVD. No thrill. No extra heart sounds. PULM: CTA B, no wheezes, crackles, rhonchi. No retractions. No resp. distress. No accessory muscle use. ABD: S, NT, ND EXTR: No c/c/e NEURO Normal gait.  PSYCH: Normally interactive. Conversant. Not depressed or anxious appearing.  Calm demeanor.  I am able to reproduce pain in her chest and back by pressing on the muscles and bones of her chest wall  Dg Chest 2 View  Result Date: 03/13/2017 CLINICAL DATA:  Cough and congestion with fever for 4 days EXAM: CHEST  2 VIEW COMPARISON:  July 24, 2016 FINDINGS: There is mild atelectasis in the left base. Lungs elsewhere are clear. Heart size and pulmonary vascularity are normal. No adenopathy. There is aortic atherosclerosis. There is lower thoracic levoscoliosis. IMPRESSION: Mild left base atelectasis. No edema or consolidation. Aortic atherosclerosis present. Electronically Signed   By: Lowella Grip III M.D.   On: 03/13/2017 13:46    Assessment and Plan: Cough - Plan: DG Chest 2 View, cefdinir (OMNICEF)  300 MG capsule  Low grade fever - Plan: DG Chest 2 View  Gum inflammation  Here today to discuss a cough and low grade temp for the last few days.  She is on arava and methotrexate for her RA Suspect a viral illness as her CXR is negative for pneumonia.  However did give omnicef rx for her to hold and use if not getting better soon Encouraged her to see a dentist for a consultation and recommendations,   Signed Lamar Blinks, MD

## 2017-03-13 NOTE — Patient Instructions (Addendum)
I would recommend that you see a general dentist for an evaluation and cleaning- I go to [facility] and have always been happy with them. If you do need any procedures/ extraction I might see if this could be done by oral surgery.  This might enable you to use your medical insurance and save money!   I suspect from your spectrum of symptoms that you have a viral infection.  However, I will also give you an rx for omnicef to hang on to. If you are not improving in the next few days or if you continue to have fevers, please contact me and also start this antibiotic

## 2017-03-27 DIAGNOSIS — D2272 Melanocytic nevi of left lower limb, including hip: Secondary | ICD-10-CM | POA: Diagnosis not present

## 2017-03-27 DIAGNOSIS — D225 Melanocytic nevi of trunk: Secondary | ICD-10-CM | POA: Diagnosis not present

## 2017-03-27 DIAGNOSIS — Z85828 Personal history of other malignant neoplasm of skin: Secondary | ICD-10-CM | POA: Diagnosis not present

## 2017-03-27 DIAGNOSIS — D235 Other benign neoplasm of skin of trunk: Secondary | ICD-10-CM | POA: Diagnosis not present

## 2017-03-27 DIAGNOSIS — L720 Epidermal cyst: Secondary | ICD-10-CM | POA: Diagnosis not present

## 2017-03-27 DIAGNOSIS — L821 Other seborrheic keratosis: Secondary | ICD-10-CM | POA: Diagnosis not present

## 2017-03-27 DIAGNOSIS — Z85820 Personal history of malignant melanoma of skin: Secondary | ICD-10-CM | POA: Diagnosis not present

## 2017-03-27 DIAGNOSIS — D1801 Hemangioma of skin and subcutaneous tissue: Secondary | ICD-10-CM | POA: Diagnosis not present

## 2017-04-08 ENCOUNTER — Encounter (HOSPITAL_BASED_OUTPATIENT_CLINIC_OR_DEPARTMENT_OTHER): Payer: Self-pay

## 2017-04-08 ENCOUNTER — Ambulatory Visit (HOSPITAL_BASED_OUTPATIENT_CLINIC_OR_DEPARTMENT_OTHER)
Admission: RE | Admit: 2017-04-08 | Discharge: 2017-04-08 | Disposition: A | Discharge status: Home / Self Care | Payer: Medicare Other | Source: Ambulatory Visit | Attending: Family Medicine | Admitting: Family Medicine

## 2017-04-08 DIAGNOSIS — Z1231 Encounter for screening mammogram for malignant neoplasm of breast: Secondary | ICD-10-CM | POA: Diagnosis not present

## 2017-04-08 DIAGNOSIS — Z1239 Encounter for other screening for malignant neoplasm of breast: Secondary | ICD-10-CM

## 2017-04-10 ENCOUNTER — Other Ambulatory Visit: Payer: Self-pay | Admitting: Emergency Medicine

## 2017-04-10 ENCOUNTER — Telehealth: Payer: Self-pay | Admitting: Family Medicine

## 2017-04-10 MED ORDER — CLONAZEPAM 0.5 MG PO TABS: 0.5000 mg | ORAL_TABLET | Freq: Two times a day (BID) | ORAL | 2 refills | Status: DC | PRN

## 2017-04-10 NOTE — Telephone Encounter (Signed)
Requesting refill on clonazepam 0.5 MG tablet, please call into [facility] on [address]

## 2017-04-15 ENCOUNTER — Ambulatory Visit (INDEPENDENT_AMBULATORY_CARE_PROVIDER_SITE_OTHER): Payer: Medicare Other | Admitting: Internal Medicine

## 2017-04-15 ENCOUNTER — Encounter: Payer: Self-pay | Admitting: Internal Medicine

## 2017-04-15 VITALS — BP 178/88 | HR 77 | Ht 65.0 in | Wt 157.8 lb

## 2017-04-15 DIAGNOSIS — I351 Nonrheumatic aortic (valve) insufficiency: Secondary | ICD-10-CM

## 2017-04-15 DIAGNOSIS — I712 Thoracic aortic aneurysm, without rupture, unspecified: Secondary | ICD-10-CM

## 2017-04-15 DIAGNOSIS — E782 Mixed hyperlipidemia: Secondary | ICD-10-CM | POA: Diagnosis not present

## 2017-04-15 DIAGNOSIS — R252 Cramp and spasm: Secondary | ICD-10-CM

## 2017-04-15 DIAGNOSIS — I10 Essential (primary) hypertension: Secondary | ICD-10-CM | POA: Diagnosis not present

## 2017-04-15 DIAGNOSIS — R0789 Other chest pain: Secondary | ICD-10-CM | POA: Diagnosis not present

## 2017-04-15 MED ORDER — VALSARTAN 160 MG PO TABS: 160.0000 mg | ORAL_TABLET | Freq: Two times a day (BID) | ORAL | 1 refills | Status: DC

## 2017-04-15 NOTE — Patient Instructions (Signed)
Medication Instructions:  Increase valsartan to 160 mg two times a day.  DO NOT TAKE atorvastatin for 2 weeks. If your leg cramps do not get any better start back on atorvastatin 20 mg daily.  If your leg cramps do get better, call the office and let Dr End know.  Labwork: BMET/Magnesium in about 2 weeks.  Testing/Procedures: None    Follow-Up: Your physician recommends that you schedule a follow-up appointment in: 2 weeks for a BP check in the [facility].   Your physician recommends that you schedule a follow-up appointment in: 3 months with Dr End.       If you need a refill on your cardiac medications before your next appointment, please call your pharmacy.

## 2017-04-15 NOTE — Progress Notes (Signed)
Follow-up Outpatient Visit Date: 04/15/2017  Primary Care Provider: [name], MD [address] [place] [address]  Chief Complaint: Chest pain  HPI:  Ms. Scarbrough is a 71 y.o. year-old female with history of aortic regurgitation, mild ascending aortic dilation, suspected prior pericarditis, and rheumatoid arthritis, who presents for follow-up. She was previously followed in our office by Dr. Aundra Dubin, having last been seen in 09/2016. She was initially diagnosed with possible pericarditis 2004 in [place]. LHC at that time showed no significant disease. She had a recurrence of chest pain in 07/2016, thought to be due to pericarditis. She was treated with colchicine and NSAIDs with resolution of her symptoms. Myocardial perfusion stress test was ordered but never performed. Patient also has a history of a malformed albeit trileaflet aortic valve with mild AI and mild ascending aorta enlargement. At her last visit in December, she was still having occasional nonexertional chest pain. Subsequent myocardial perfusion stress test was low risk without ischemia.  Today, Ms. Jakes reports that she feels relatively well. She continues to have occasional episodes of substernal chest pain that she describes only as "a pain." The discomfort can come on at different times and is not exertional or related to any other position/activity. The pain lasts a few minutes and then resolve spontaneously. She notes that it is different than what she has experienced in the past with pericarditis. She also complains of intermittent pain in her legs, particularly cramp-like pains at night. She also has chronic numbness and tingling involving her hands and feet. She is concerned that the numbness/tingling may be related to a massage device she has been using on her upper back, as well as her recently diagnosed fibromyalgia.  Ms. Faidley reports being compliant with her medications, though she did not  increase valsartan to 160 mg twice a day as instructed by Dr. Aundra Dubin at her prior visit. She is currently not using colchicine either. She has frequent myalgias, which she attributes to nerve issues as well as her statin. Atorvastatin was cut down from 40 mg to 20 mg by Dr. Lorelei Pont earlier this year.  --------------------------------------------------------------------------------------------------  Cardiovascular History & Procedures: Cardiovascular Problems:  Recurrent pericarditis  Aortic regurgitation  Dilated thoracic aorta  Risk Factors:  Age 43  Cath/PCI:  LHC (2004, [place]): No significant CAD  CV Surgery:  None  EP Procedures and Devices:  None  Non-Invasive Evaluation(s):  Pharmacologic MPI (10/15/16): No risk study without ischemia. Small in size, moderate in severity mid and apical anterior fixed defect. LVEF 55%.  TTE (05/10/16): Normal LV size with mild focal basal hypertrophy of the septum. LVEF 65-70% with normal wall motion. Grade 1 diastolic dysfunction. Moderate aortic regurgitation. Normal aortic root and ascending aortic size. Normal left atrial size. Normal RV size and function. Trivial TR. Normal pulmonary artery pressure.  MRI chest (12/30/14): Stable mild dilation of the descending thoracic aorta measuring 3.9 cm in greatest diameter. Stable aberrant right subclavian artery.  TTE (12/07/14): Normal LV size and contraction with LVEF of 50-55%. Grade 1 diastolic dysfunction. Moderate thickening and calcification of the aortic valve with moderate regurgitation. Mildly dilated ascending aorta measuring 4.0 cm. Mitral annular calcification with mild MR. Lipomatous hypertrophy of the septum. Mild TR. Normal RV size and function.  Cardiac MRI (03/07/09): Trileaflet aortic valve with mild dilation of the ascending aorta measuring 4.0 x 3.6 cm  Recent CV Pertinent Labs: Lab Results  Component Value Date   CHOL 147 04/20/2016  HDL 52 04/20/2016   LDLCALC 77  04/20/2016   TRIG 91 04/20/2016   CHOLHDL 2.8 04/20/2016   BNP 19.4 04/20/2016   K 4.3 01/18/2017   BUN 13 01/18/2017   CREATININE 0.77 01/18/2017    Past medical and surgical history were reviewed and updated in EPIC.  Outpatient Encounter Prescriptions as of 04/15/2017  Medication Sig  . albuterol (PROVENTIL HFA;VENTOLIN HFA) 108 (90 Base) MCG/ACT inhaler Inhale 2 puffs into the lungs every 6 (six) hours as needed for wheezing or shortness of breath.  Marland Kitchen aspirin EC 81 MG tablet Take 1 tablet (81 mg total) by mouth daily.  Marland Kitchen atorvastatin (LIPITOR) 40 MG tablet Take 1 tablet (40 mg total) by mouth daily.  . clonazePAM (KLONOPIN) 0.5 MG tablet Take 1 tablet (0.5 mg total) by mouth 2 (two) times daily as needed for anxiety.  . Cyanocobalamin (VITAMIN B 12 PO) Take 1 tablet by mouth daily.  . cycloSPORINE (RESTASIS) 0.05 % ophthalmic emulsion 1 drop 2 (two) times daily.  . famotidine (PEPCID) 20 MG tablet Take 1 tablet (20 mg total) by mouth at bedtime.  . fluticasone (FLONASE) 50 MCG/ACT nasal spray Place 2 sprays into both nostrils daily.  . folic acid (FOLVITE) 1 MG tablet Take 2 tablets (2 mg total) by mouth daily.  Marland Kitchen ibuprofen (ADVIL,MOTRIN) 600 MG tablet One tablet three times daily x 3 weeks then stop  . leflunomide (ARAVA) 10 MG tablet Take 1 tablet (10 mg total) by mouth daily.  . methotrexate 50 MG/2ML injection Inject 0.11mL sub q once a week  . Multiple Vitamins-Minerals (PRESERVISION AREDS PO) Take by mouth. Reported on 02/15/2016  . Respiratory Therapy Supplies (FLUTTER) DEVI Use as directed  . sertraline (ZOLOFT) 100 MG tablet Take 2 tablets (200 mg total) by mouth daily.  Marland Kitchen SYNTHROID 100 MCG tablet TAKE 1 TABLET BY MOUTH DAILY BEFORE BREAKFAST  . valsartan (DIOVAN) 160 MG tablet Take 1 tablet (160 mg total) by mouth 2 (two) times daily.  . [DISCONTINUED] cefdinir (OMNICEF) 300 MG capsule Take 1 capsule (300 mg total) by mouth 2 (two) times daily. (Patient not taking: Reported  on 04/15/2017)  . [DISCONTINUED] Vitamin D, Ergocalciferol, (DRISDOL) 50000 units CAPS capsule Take 1 capsule (50,000 Units total) by mouth every 7 (seven) days. (Patient not taking: Reported on 04/15/2017)   No facility-administered encounter medications on file as of 04/15/2017.     Allergies: Tramadol  Social History   Social History  . Marital status: Divorced    Spouse name: N/A  . Number of children: 3  . Years of education: N/A   Occupational History  . retired    Social History Main Topics  . Smoking status: Former Smoker    Packs/day: 2.00    Years: 20.00    Types: Cigarettes    Quit date: 10/30/1983  . Smokeless tobacco: Never Used  . Alcohol use No  . Drug use: No  . Sexual activity: Not on file   Other Topics Concern  . Not on file   Social History Narrative   Lives alone in a one story home.  Has 2 living children.  Retired Engineer, water.      Family History  Problem Relation Age of Onset  . Heart disease Mother   . Hypertension Mother   . Mental illness Mother   . Allergies Mother   . Heart disease Father   . Hypertension Father   . Mental illness Father   . Emphysema Father  smoked  . Allergies Father   . Heart attack Father   . Hyperlipidemia Sister   . Hypertension Sister   . Allergies Sister   . Hypertension Sister   . Diabetes Sister   . Allergies Sister   . Colon cancer Neg Hx   . Esophageal cancer Neg Hx   . Rectal cancer Neg Hx   . Stomach cancer Neg Hx     Review of Systems: Patient notes occasional myalgias, particularly in her left arm and shoulder. She also has chronic balance problems. Otherwise, a 12-system review of systems was performed and was negative except as noted in the HPI.  --------------------------------------------------------------------------------------------------  Physical Exam: BP (!) 144/84   Pulse 77   Ht 5\' 5"  (1.651 m)   Wt 157 lb 12.8 oz (71.6 kg)   BMI 26.26 kg/m  Repeat BP:  178/88  General:  Well-developed, well-nourished woman, seated comfortably in the exam room. HEENT: No conjunctival pallor or scleral icterus.  Moist mucous membranes.  OP clear. Neck: Supple without lymphadenopathy, thyromegaly, JVD, or HJR. Lungs: Normal work of breathing.  Clear to auscultation bilaterally without wheezes or crackles. Heart: Regular rate and rhythm without murmurs, rubs, or gallops.  Non-displaced PMI. No chest wall tenderness. Abd: Bowel sounds present.  Soft, NT/ND without hepatosplenomegaly Ext: Trace ankle edema bilaterally.  Radial, PT, and DP pulses are 2+ bilaterally. Skin: warm and dry without rash  EKG:  Normal sinus rhythm with inferior and anterolateral ST/T changes. No significant change from multiple prior tracings.  Lab Results  Component Value Date   WBC 5.5 01/18/2017   HGB 13.7 01/18/2017   HCT 41.3 01/18/2017   MCV 90.6 01/18/2017   PLT 307 01/18/2017    Lab Results  Component Value Date   NA 138 01/18/2017   K 4.3 01/18/2017   CL 103 01/18/2017   CO2 28 01/18/2017   BUN 13 01/18/2017   CREATININE 0.77 01/18/2017   GLUCOSE 90 01/18/2017   ALT 29 01/18/2017    Lab Results  Component Value Date   CHOL 147 04/20/2016   HDL 52 04/20/2016   LDLCALC 77 04/20/2016   TRIG 91 04/20/2016   CHOLHDL 2.8 04/20/2016   --------------------------------------------------------------------------------------------------  ASSESSMENT AND PLAN: Atypical chest pain Discomfort described by patient is not consistent with severe coronary insufficiency. Additionally, prior cardiac workup, including catheterization in 2010 and myocardial perfusion stress test this past December, were reassuring. She states that her pain is not consistent with what she has felt in the past with pericarditis. We will continue with primary prevention. Hopefully, she will feel better with improved blood pressure control.  Aortic regurgitation No murmurs appreciated on exam  today. Echocardiogram last July showed stable moderate AI. We will continue clinical follow-up and consider repeat echo when we see each other again in 3 months. Blood pressure control will be important in the setting of her aortic regurgitation.  Thoracic aortic aneurysm Mild enlargement of the thoracic aorta has been noted on prior studies. Interestingly, most recent echo from last July, and saw normal sized aorta. We will discuss repeating echo +/- cross-sectional imaging follow-up.  Hypertension Blood pressure is poorly controlled today. She has not been taking valsartan twice a day, as previously directed by Dr. Aundra Dubin. I have instructed her to take valsartan 160 mg twice a day and to return in 2 weeks for a basic metabolic panel and blood pressure check. If her blood pressure remains elevated at that time, we will need to consider adding an  additional agent.  Leg pain/cramps Symptoms are most consistent with cramps/myalgias superimposed on some sort of neuropathy. Exam today is unrevealing except for trace ankle edema. We will check a BMP and magnesium level in 2 weeks. I encouraged Ms. Demetrius to remain well-hydrated. She should follow-up with her PCP for further evaluation of her paresthesias and balance issues.  Hyperlipidemia Most recent LDL was 77 and 03/2016. Given her history of stroke, she would benefit from aggressive statin therapy with a goal LDL less than 70. She notes that her atorvastatin was cut in half recently by her PCP due to myalgias. Given continued muscle pains, have asked Ms. Tidwell to hold atorvastatin for 2 weeks. If her myalgias do not improve, she should restart. If she notices improvement, we will consider switching to an alternative agent.  Follow-up: Return to clinic in 3 months.  Nelva Bush, MD 04/16/2017 9:06 AM

## 2017-04-28 ENCOUNTER — Other Ambulatory Visit: Payer: Self-pay | Admitting: Cardiology

## 2017-04-29 ENCOUNTER — Encounter: Payer: Self-pay | Admitting: Pharmacist

## 2017-04-29 ENCOUNTER — Ambulatory Visit (INDEPENDENT_AMBULATORY_CARE_PROVIDER_SITE_OTHER): Payer: Medicare Other | Admitting: Pharmacist

## 2017-04-29 ENCOUNTER — Other Ambulatory Visit: Payer: Medicare Other | Admitting: *Deleted

## 2017-04-29 VITALS — BP 142/84 | HR 83

## 2017-04-29 DIAGNOSIS — I10 Essential (primary) hypertension: Secondary | ICD-10-CM

## 2017-04-29 DIAGNOSIS — E782 Mixed hyperlipidemia: Secondary | ICD-10-CM

## 2017-04-29 DIAGNOSIS — R252 Cramp and spasm: Secondary | ICD-10-CM

## 2017-04-29 MED ORDER — AMLODIPINE BESYLATE 5 MG PO TABS: 5.0000 mg | ORAL_TABLET | Freq: Every day | ORAL | 11 refills | Status: DC

## 2017-04-29 NOTE — Telephone Encounter (Signed)
FOLLOWED BY DR END

## 2017-04-29 NOTE — Patient Instructions (Addendum)
Start taking amlodipine 5mg  once a day for your blood pressure  Continue taking your valsartan 160mg  twice a day  Goal blood pressure is less than 130/80  Look for caffeine free soda  Continue to check your blood pressure at home and record readings  Follow up in blood pressure clinic in 1 month

## 2017-04-29 NOTE — Progress Notes (Addendum)
Patient ID: Katherine Wang                 DOB: [birth year]                      MRN: [id number]     HPI: Katherine Wang is a 71 y.o. female referred by Dr. Saunders Revel to HTN clinic. 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  Pt reports feeling well overall. She has been tolerating her higher valsartan dose well. Denies dizziness, blurred vision, falls, or headache. Home BP readings are improved - 148/80 and 121/82 when checked over the past few weeks. She brings in her home bicep BP cuff today for calibration. Home reading: left arm 142/92, HR 98, right arm 141/88, HR 85. Clinic reading: left arm 142/84, right arm 150/82. Home cuff is measuring accurately.  Pt also states she was advised to discontinue her Lipitor 2 weeks ago to see if this would help improve her myalgias in her arms. Reports that she has not noticed any improvement in pain level. Is having her cholesterol checked today - may need to revisit statin therapy since sx likely would have started to improve if they were statin-induced. Pt also has dietary indiscretion and eats a lot of sweets in her diet with minimal activity.  Current HTN meds: valsartan 160mg  BID BP goal: <130/45mmHg  Family History: Mother and father both with history of heart disease and HTN. Sister with HTN and DM.  Social History: Former smoker 2 PPD, quit in 1985. Denies alcohol and illicit drug use.  Diet: Likes proteins and carbohydrates. Breakfast - raisin bran cereal. Likes smaller more frequent snacks ~ every 2 hours. Snacks include: kettle potato chips, sweets, prunes, toast with jelly, salad, stew, beans, crackers with cheese or peanut butter. Does not add salt to her food. Drinks 3 small regular Cokes each day and 2 cups of caffeinated coffee each day.  Exercise: Has rheumatoid  arthritis in her knees which limits walking.   Home BP readings: 148/80, 121/82  Wt Readings from Last 3 Encounters:  04/15/17 157 lb 12.8 oz (71.6 kg)  03/13/17 152 lb 3.2 oz (69 kg)  03/01/17 154 lb (69.9 kg)   BP Readings from Last 3 Encounters:  04/15/17 (!) 178/88  03/13/17 124/82  02/18/17 (!) 142/84   Pulse Readings from Last 3 Encounters:  04/15/17 77  03/13/17 87  02/18/17 86    Renal function: CrCl cannot be calculated (Patient's most recent lab result is older than the maximum 21 days allowed.).  Past Medical History:  Diagnosis Date  . Adenomatous colon polyp 2012  . Allergy   . Anxiety   . Aortic insufficiency   . Arthritis   . Basal cell carcinoma   . Cataract   . Depression   . Diverticulosis   . Fibromyalgia   . Fibromyalgia   . Hyperlipidemia   . Hypertension   . Macular degeneration   . Melanoma ([place])   . Murmur, heart   . Reflux   . Stroke ([place])    per pt. mild stroke  . Thyroid disease     Current Outpatient Prescriptions on File Prior to Visit  Medication Sig Dispense Refill  . albuterol (PROVENTIL HFA;VENTOLIN HFA) 108 (90 Base) MCG/ACT inhaler Inhale 2 puffs into the lungs every 6 (six) hours as needed for wheezing  or shortness of breath. 18 g 2  . aspirin EC 81 MG tablet Take 1 tablet (81 mg total) by mouth daily. 90 tablet 3  . atorvastatin (LIPITOR) 20 MG tablet ON HOLD 04/15/17    . clonazePAM (KLONOPIN) 0.5 MG tablet Take 1 tablet (0.5 mg total) by mouth 2 (two) times daily as needed for anxiety. 30 tablet 2  . Cyanocobalamin (VITAMIN B 12 PO) Take 1 tablet by mouth daily.    . cycloSPORINE (RESTASIS) 0.05 % ophthalmic emulsion 1 drop 2 (two) times daily.    . famotidine (PEPCID) 20 MG tablet Take 1 tablet (20 mg total) by mouth at bedtime. 30 tablet 5  . fluticasone (FLONASE) 50 MCG/ACT nasal spray Place 2 sprays into both nostrils daily. 16 g 5  . folic acid (FOLVITE) 1 MG tablet Take 2 tablets (2 mg total) by mouth daily. 180  tablet 4  . ibuprofen (ADVIL,MOTRIN) 600 MG tablet One tablet three times daily x 3 weeks then stop 63 tablet 0  . leflunomide (ARAVA) 10 MG tablet Take 1 tablet (10 mg total) by mouth daily. 90 tablet 0  . methotrexate 50 MG/2ML injection Inject 0.59mL sub q once a week 10 mL 0  . Multiple Vitamins-Minerals (PRESERVISION AREDS PO) Take by mouth. Reported on 02/15/2016    . Respiratory Therapy Supplies (FLUTTER) DEVI Use as directed 1 each 0  . sertraline (ZOLOFT) 100 MG tablet Take 2 tablets (200 mg total) by mouth daily. 180 tablet 3  . SYNTHROID 100 MCG tablet TAKE 1 TABLET BY MOUTH DAILY BEFORE BREAKFAST 90 tablet 3  . valsartan (DIOVAN) 160 MG tablet Take 1 tablet (160 mg total) by mouth 2 (two) times daily. 180 tablet 1   No current facility-administered medications on file prior to visit.     Allergies  Allergen Reactions  . Tramadol Other (See Comments)    dizzy     Assessment/Plan:  1. Hypertension  - BP improved since increasing valsartan dose however still above goal <130/84mmHg. Will start amlodipine 5mg  daily and continue valsartan 160mg  BID (pt prefers to continue BID dosing rather than 320mg  once a day). Advised pt to continue to monitor her BP at home - cuff was calibrated in clinic and is providing accurate readings. Advised pt to look for caffeine free soda. F/u in HTN clinic in 4 weeks.  2. Hyperlipidemia - LDL goal < 70 given hx of stroke. Pt reports no improvement in muscle aches in her arms since discontinuing Lipitor 2 weeks ago. Her LDL was 77 last year on 40mg  daily, however her PCP had cut her dose back to 20mg . Pt is hesitant to resume Lipitor at 40mg  daily, and 20mg  is unlikely to bring LDL to goal. Discussed trying Crestor - pt states she prefers this and tolerated therapy well in the past but cost was prohibitive. Crestor is generic now and cost should no longer be an issue. Will start pt on Crestor 20mg  daily.   Megan E. Supple, PharmD, CPP, [place] [address] [place], [place] [address] Phone: [phone]; Fax: [phone] 04/29/2017 11:09 AM

## 2017-04-30 LAB — BASIC METABOLIC PANEL
BUN / CREAT RATIO: 13 (ref 12–28)
BUN: 11 mg/dL (ref 8–27)
CHLORIDE: 100 mmol/L (ref 96–106)
CO2: 22 mmol/L (ref 20–29)
Calcium: 9.3 mg/dL (ref 8.7–10.3)
Creatinine, Ser: 0.83 mg/dL (ref 0.57–1.00)
GFR calc non Af Amer: 70 mL/min/{1.73_m2} (ref >59)
GFR, EST AFRICAN AMERICAN: 81 mL/min/{1.73_m2} (ref >59)
Glucose: 83 mg/dL (ref 65–99)
POTASSIUM: 4.6 mmol/L (ref 3.5–5.2)
Sodium: 140 mmol/L (ref 134–144)

## 2017-04-30 LAB — MAGNESIUM: Magnesium: 2.2 mg/dL (ref 1.6–2.3)

## 2017-05-02 MED ORDER — ROSUVASTATIN CALCIUM 20 MG PO TABS: 20.0000 mg | ORAL_TABLET | Freq: Every day | ORAL | 11 refills | Status: DC

## 2017-05-02 NOTE — Addendum Note (Signed)
Addended by: SUPPLE, MEGAN E on: 05/02/2017 09:29 AM   Modules accepted: Orders

## 2017-05-08 ENCOUNTER — Other Ambulatory Visit: Payer: Self-pay | Admitting: Rheumatology

## 2017-05-08 NOTE — Telephone Encounter (Signed)
Refill prescription after lab draw

## 2017-05-08 NOTE — Telephone Encounter (Signed)
Last Visit: 01/18/17 Next Visit: 06/20/17 Labs: 01/18/17 WNL  Patient will update lab tomorrow.  Okay to refill MTX?

## 2017-05-09 ENCOUNTER — Other Ambulatory Visit: Payer: Self-pay | Admitting: Radiology

## 2017-05-09 DIAGNOSIS — Z79899 Other long term (current) drug therapy: Secondary | ICD-10-CM

## 2017-05-09 LAB — CBC WITH DIFFERENTIAL/PLATELET
BASOS PCT: 0 %
Basophils Absolute: 0 cells/uL (ref 0–200)
EOS ABS: 96 {cells}/uL (ref 15–500)
Eosinophils Relative: 2 %
HCT: 42.5 % (ref 35.0–45.0)
Hemoglobin: 14 g/dL (ref 11.7–15.5)
Lymphocytes Relative: 29 %
Lymphs Abs: 1392 cells/uL (ref 850–3900)
MCH: 29.9 pg (ref 27.0–33.0)
MCHC: 32.9 g/dL (ref 32.0–36.0)
MCV: 90.6 fL (ref 80.0–100.0)
MONO ABS: 384 {cells}/uL (ref 200–950)
MONOS PCT: 8 %
MPV: 9.6 fL (ref 7.5–12.5)
NEUTROS ABS: 2928 {cells}/uL (ref 1500–7800)
Neutrophils Relative %: 61 %
PLATELETS: 231 10*3/uL (ref 140–400)
RBC: 4.69 MIL/uL (ref 3.80–5.10)
RDW: 15.1 % — ABNORMAL HIGH (ref 11.0–15.0)
WBC: 4.8 10*3/uL (ref 3.8–10.8)

## 2017-05-09 LAB — COMPREHENSIVE METABOLIC PANEL
ALK PHOS: 95 U/L (ref 33–130)
ALT: 19 U/L (ref 6–29)
AST: 26 U/L (ref 10–35)
Albumin: 4.1 g/dL (ref 3.6–5.1)
BUN: 11 mg/dL (ref 7–25)
CO2: 25 mmol/L (ref 20–31)
CREATININE: 0.78 mg/dL (ref 0.60–0.93)
Calcium: 9.1 mg/dL (ref 8.6–10.4)
Chloride: 102 mmol/L (ref 98–110)
GLUCOSE: 93 mg/dL (ref 65–99)
POTASSIUM: 4.7 mmol/L (ref 3.5–5.3)
SODIUM: 137 mmol/L (ref 135–146)
TOTAL PROTEIN: 6.6 g/dL (ref 6.1–8.1)
Total Bilirubin: 0.6 mg/dL (ref 0.2–1.2)

## 2017-05-10 NOTE — Progress Notes (Signed)
Within normal limits

## 2017-05-21 ENCOUNTER — Telehealth: Payer: Self-pay | Admitting: Internal Medicine

## 2017-05-21 NOTE — Telephone Encounter (Signed)
I agree, no indication for prophylactic antibiotics for dental procedures.  Nelva Bush, MD [facility] [place] [facility] Pager: [phone]

## 2017-05-21 NOTE — Telephone Encounter (Signed)
New message    Pt is calling asking for a call back from Rn. She said it's about her appt with her dentist, she is asking if she needs antibiotics beforehand.

## 2017-05-21 NOTE — Telephone Encounter (Signed)
Pt advised she should not need antibiotics prior to dental appointment/procedure according to [facility] recommendations

## 2017-05-24 ENCOUNTER — Telehealth: Payer: Self-pay | Admitting: Internal Medicine

## 2017-05-24 NOTE — Telephone Encounter (Signed)
Pt has questions about dental appointments coming up and is asking Dr Darnelle Bos opinion about having  dental procedures recommended by the dentist all in one day. I recommended pt ask dentist to fax information about procedures to Dr End and I will ask him to review . Pt verbalized understanding.

## 2017-05-24 NOTE — Telephone Encounter (Signed)
New Message:    Pt says she needs to talk to you about some dental work she is going to have.

## 2017-05-27 ENCOUNTER — Ambulatory Visit: Payer: Self-pay | Admitting: Pharmacist

## 2017-05-27 NOTE — Progress Notes (Deleted)
Patient ID: SUA SPADAFORA                 DOB: Jun 13, 1946                      MRN: [id number]     HPI: Katherine Wang is a 71 y.o. female referred by Dr. Saunders Revel to HTN clinic for follow up. 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 At last visit, she was started on amlodipine 5mg  daily for HTN and Crestor 20mg  daily for HLD.  Pt reports feeling well overall. She has been tolerating her new medications well. Denies dizziness, blurred vision, falls, or headache. Home BP readings are improved - 148/80 and 121/82 when checked over the past few weeks. She brings in her home bicep BP cuff today for calibration. Home reading: left arm 142/92, HR 98, right arm 141/88, HR 85. Clinic reading: left arm 142/84, right arm 150/82. Home cuff is measuring accurately.  Pt also states she was advised to discontinue her Lipitor 2 weeks ago to see if this would help improve her myalgias in her arms. Reports that she has not noticed any improvement in pain level. Is having her cholesterol checked today - may need to revisit statin therapy since sx likely would have started to improve if they were statin-induced. Pt also has dietary indiscretion and eats a lot of sweets in her diet with minimal activity.   Tolerating amlodipine and crestor? Inc amlodipine if needed or start chlorthalidone  Current HTN meds: valsartan 160mg  BID, amlodipine 5mg  daily BP goal: <130/35mmHg  Family History: Mother and father both with history of heart disease and HTN. Sister with HTN and DM.  Social History: Former smoker 2 PPD, quit in 1985. Denies alcohol and illicit drug use.  Diet: Likes proteins and carbohydrates. Breakfast - raisin bran cereal. Likes smaller more frequent snacks ~ every 2 hours. Snacks include: kettle potato chips, sweets, prunes,  toast with jelly, salad, stew, beans, crackers with cheese or peanut butter. Does not add salt to her food. Drinks 3 small regular Cokes each day and 2 cups of caffeinated coffee each day.  Exercise: Has rheumatoid arthritis in her knees which limits walking.   Home BP readings: 148/80, 121/82  Wt Readings from Last 3 Encounters:  04/15/17 157 lb 12.8 oz (71.6 kg)  03/13/17 152 lb 3.2 oz (69 kg)  03/01/17 154 lb (69.9 kg)   BP Readings from Last 3 Encounters:  04/29/17 (!) 142/84  04/15/17 (!) 178/88  03/13/17 124/82   Pulse Readings from Last 3 Encounters:  04/29/17 83  04/15/17 77  03/13/17 87    Renal function: CrCl cannot be calculated (Unknown ideal weight.).  Past Medical History:  Diagnosis Date  . Adenomatous colon polyp 2012  . Allergy   . Anxiety   . Aortic insufficiency   . Arthritis   . Basal cell carcinoma   . Cataract   . Depression   . Diverticulosis   . Fibromyalgia   . Fibromyalgia   . Hyperlipidemia   . Hypertension   . Macular degeneration   . Melanoma ([place])   . Murmur, heart   . Reflux   . Stroke ([place])    per pt. mild stroke  . Thyroid disease     Current Outpatient Prescriptions on File Prior to Visit  Medication Sig Dispense  Refill  . albuterol (PROVENTIL HFA;VENTOLIN HFA) 108 (90 Base) MCG/ACT inhaler Inhale 2 puffs into the lungs every 6 (six) hours as needed for wheezing or shortness of breath. 18 g 2  . amLODipine (NORVASC) 5 MG tablet Take 1 tablet (5 mg total) by mouth daily. 30 tablet 11  . aspirin EC 81 MG tablet Take 1 tablet (81 mg total) by mouth daily. 90 tablet 3  . clonazePAM (KLONOPIN) 0.5 MG tablet Take 1 tablet (0.5 mg total) by mouth 2 (two) times daily as needed for anxiety. 30 tablet 2  . Cyanocobalamin (VITAMIN B 12 PO) Take 1 tablet by mouth daily.    . cycloSPORINE (RESTASIS) 0.05 % ophthalmic emulsion 1 drop 2 (two) times daily.    . famotidine (PEPCID) 20 MG tablet Take 1 tablet (20 mg total) by mouth at  bedtime. 30 tablet 5  . fluticasone (FLONASE) 50 MCG/ACT nasal spray Place 2 sprays into both nostrils daily. 16 g 5  . folic acid (FOLVITE) 1 MG tablet Take 2 tablets (2 mg total) by mouth daily. 180 tablet 4  . ibuprofen (ADVIL,MOTRIN) 600 MG tablet One tablet three times daily x 3 weeks then stop 63 tablet 0  . leflunomide (ARAVA) 10 MG tablet Take 1 tablet (10 mg total) by mouth daily. 90 tablet 0  . methotrexate 50 MG/2ML injection INJECT 0.8 ML UNDER THE SKIN ONCE EVERY WEEK 10 mL 0  . Multiple Vitamins-Minerals (PRESERVISION AREDS PO) Take by mouth. Reported on 02/15/2016    . Respiratory Therapy Supplies (FLUTTER) DEVI Use as directed 1 each 0  . rosuvastatin (CRESTOR) 20 MG tablet Take 1 tablet (20 mg total) by mouth daily. 30 tablet 11  . sertraline (ZOLOFT) 100 MG tablet Take 2 tablets (200 mg total) by mouth daily. 180 tablet 3  . SYNTHROID 100 MCG tablet TAKE 1 TABLET BY MOUTH DAILY BEFORE BREAKFAST 90 tablet 3  . valsartan (DIOVAN) 160 MG tablet Take 1 tablet (160 mg total) by mouth 2 (two) times daily. 180 tablet 1   No current facility-administered medications on file prior to visit.     Allergies  Allergen Reactions  . Tramadol Other (See Comments)    dizzy     Assessment/Plan:

## 2017-06-04 ENCOUNTER — Telehealth: Payer: Self-pay | Admitting: Internal Medicine

## 2017-06-04 NOTE — Telephone Encounter (Signed)
error 

## 2017-06-04 NOTE — Telephone Encounter (Signed)
Tammy states pt asked Dr Hardie Shackleton to call and speak with Dr End about dental procedure scheduled.   Tammy advised I will forward to Dr End for review, probably first of week before we are back in touch.

## 2017-06-04 NOTE — Telephone Encounter (Signed)
New message    Katherine Wang is calling back for [facility]. She said Dr. Hardie Shackleton called and left a very detailed message this morning and has not heard anything back. Please call.

## 2017-06-04 NOTE — Telephone Encounter (Signed)
LMTCB for Tammy 

## 2017-06-04 NOTE — Telephone Encounter (Signed)
I spoke with Tammy to ask if Dr Hardie Shackleton was requesting patient hold any medications prior to dental procedure. Tammy is going to check and call me back.

## 2017-06-04 NOTE — Telephone Encounter (Signed)
New message    1. What dental office are you calling from?Dr. Hardie Shackleton  2. What is your office phone and fax number? [phone] / fax fax [phone]   3. What type of procedure is the patient having performed? 4 teeth removal   4. What date is procedure scheduled? Pending   5. What is your question (ex. Antibiotics prior to procedure, holding medication-we need to know how long dentist wants pt to hold med)? MD please advise.

## 2017-06-05 ENCOUNTER — Telehealth: Payer: Self-pay | Admitting: Pharmacist

## 2017-06-05 MED ORDER — IRBESARTAN 300 MG PO TABS: 300.0000 mg | ORAL_TABLET | Freq: Every day | ORAL | 11 refills | Status: DC

## 2017-06-05 NOTE — Telephone Encounter (Signed)
Pt called clinic stating she was affected by valsartan recall. Will switch valsartan 160mg  BID to irbesartan 300mg  daily (was taking valsartan BID per pt preference). Pt aware to monitor BP over the next few weeks.

## 2017-06-05 NOTE — Telephone Encounter (Signed)
I think it is ok for Katherine Wang to proceed. I would not advocate for antibiotic prophylaxis, as Katherine Wang does not have a history of prosthetic valve or prior endocarditis. If Dr. Hardie Shackleton has additional questions or concerns, he is welcome to call me. Thanks.  Nelva Bush, MD [facility] Pager: [phone]

## 2017-06-10 NOTE — Telephone Encounter (Signed)
I spoke with Caryl Pina at Dr Hardie Shackleton' office, she is aware of Dr Darnelle Bos recommendations, I will fax this note to Dr Hardie Shackleton.

## 2017-06-11 ENCOUNTER — Telehealth: Payer: Self-pay | Admitting: Internal Medicine

## 2017-06-11 NOTE — Telephone Encounter (Signed)
S/w Tammy. Dr. Hardie Shackleton needs to speak with Dr End directly. Dr. Hardie Shackleton has promised Mrs. Fagerstrom that he would speak directly to Dr. Saunders Revel prior to extraction of 4 teeth under IV sedation. Routing to Dr. Saunders Revel to call Dr. Hardie Shackleton at office number [phone].

## 2017-06-11 NOTE — Telephone Encounter (Signed)
Katherine Wang from Dr Raj Janus office called asking if Dr End would call back  Patient is having 4 teeth pulled, and per patient request patient asked if Dr Hardie Shackleton would just touch bases with him about this surgery. It is not scheduled yet.   Please call back

## 2017-06-11 NOTE — Telephone Encounter (Signed)
I spoke with Dr. Hardie Shackleton regarding upcoming teeth extractions. We both agree that it is ok to proceed from a cardiac standpoint. There is no indication for prophylactic antibiotics.  Nelva Bush, MD [facility] Pager: [phone]

## 2017-06-13 ENCOUNTER — Telehealth: Payer: Self-pay | Admitting: Internal Medicine

## 2017-06-13 ENCOUNTER — Other Ambulatory Visit: Payer: Self-pay | Admitting: Rheumatology

## 2017-06-13 MED ORDER — AMLODIPINE BESYLATE 10 MG PO TABS: 10.0000 mg | ORAL_TABLET | Freq: Every day | ORAL | 11 refills | Status: DC

## 2017-06-13 NOTE — Telephone Encounter (Signed)
Ok to refill per Dr Estanislado Pandy

## 2017-06-13 NOTE — Telephone Encounter (Signed)
Pt c/o medication issue:  1. Name of Medication: irebesartan 300 mg and amlodipine 5 mg  2. How are you currently taking this medication (dosage and times per day)?both medications 1x daily   3. Are you having a reaction (difficulty breathing--STAT)? no  4. What is your medication issue? Medications are not effective in controlling BP

## 2017-06-13 NOTE — Telephone Encounter (Signed)
Returned called to patient.  She states pressures have been elevated with current dose of irbesartan and amlodipine. She takes irbesartan in the morning and amlodipine in the evening.   Based on elevated readings will increase amlodipine to 10mg  daily and scheduled for HTN f/u in 2-3 weeks.   She is aware to call with any concerns and will bring her blood pressure log with her. Since she has been working with Jinny Blossom she would prefer to see her. I have added a note to her appt.

## 2017-06-13 NOTE — Telephone Encounter (Signed)
04/29/17 amlodipine 5 mg daily started. 06/06/17 valsartan 160 mg bid (recall) changed irbesartan 300 mg.  Pt states these BP readings are in the last week or so: 141/80, 140/83, 135/84, 119/76 in general top number in the low 140s, HR generally in the 80s.  Pt states BP readings are similar to BP readings before amlodipine was started in HTN Clinic 04/29/17. Pt states she has been working with Jinny Blossom in [facility] and would like me to forward this information for recommendations.  Pt advised I will forward to HTN Clinic for review.

## 2017-06-17 ENCOUNTER — Other Ambulatory Visit: Payer: Self-pay | Admitting: Family Medicine

## 2017-06-17 ENCOUNTER — Telehealth: Payer: Self-pay | Admitting: Gastroenterology

## 2017-06-17 MED ORDER — CLONAZEPAM 0.5 MG PO TABS
0.5000 mg | ORAL_TABLET | Freq: Two times a day (BID) | ORAL | 2 refills | Status: DC | PRN
Start: 2017-06-17 — End: 2017-08-29

## 2017-06-17 NOTE — Telephone Encounter (Signed)
Charge for late cancellation.  

## 2017-06-17 NOTE — Telephone Encounter (Signed)
Relation to IV:HSJW Call back number:[phone]   Reason for call:  Patient requesting a refill clonazePAM (KLONOPIN) 0.5 MG tablet , please advise

## 2017-06-18 ENCOUNTER — Encounter: Payer: Self-pay | Admitting: Gastroenterology

## 2017-06-19 ENCOUNTER — Ambulatory Visit (INDEPENDENT_AMBULATORY_CARE_PROVIDER_SITE_OTHER): Payer: Medicare Other | Admitting: Family Medicine

## 2017-06-19 ENCOUNTER — Encounter: Payer: Self-pay | Admitting: Family Medicine

## 2017-06-19 VITALS — BP 126/79 | HR 93 | Temp 98.4°F | Ht 60.0 in | Wt 155.0 lb

## 2017-06-19 DIAGNOSIS — E034 Atrophy of thyroid (acquired): Secondary | ICD-10-CM | POA: Diagnosis not present

## 2017-06-19 DIAGNOSIS — J3489 Other specified disorders of nose and nasal sinuses: Secondary | ICD-10-CM | POA: Diagnosis not present

## 2017-06-19 DIAGNOSIS — R059 Cough, unspecified: Secondary | ICD-10-CM

## 2017-06-19 DIAGNOSIS — R05 Cough: Secondary | ICD-10-CM | POA: Diagnosis not present

## 2017-06-19 NOTE — Patient Instructions (Signed)
It was great to see you again today- please let me know if you do not start feeling better soon, Sooner if worse.  Call me or send me a message if need be!

## 2017-06-19 NOTE — Progress Notes (Signed)
[place] at [facility] [address], [place], [place] [address] [phone]  Date:  06/19/2017   Name:  Katherine Wang   DOB:  15-Nov-1945   MRN:  [id number]  PCP:  [name], MD    Chief Complaint: Sinus Problem (Frontal pressure and dry cough. No mucus. States she smells a burning chemical smell in her home, not sure if it's a sinus infection or linked to something going on in her home. ); Headache (dull headache onset about 3 days ago); and Dizziness (states her balance has been off, no falls thinks it's linked to her Rx )   History of Present Illness:  Katherine Wang is a 71 y.o. very pleasant female patient who presents with the following:  Here today with possible illness- however she is feeling somewhat better, "I almost canceled my appt"  she has noted a dull headache and mild STS for the last few days Her ST will improve as the day goes on She notes sinus pressure and tenderness  She is not blowing any yellow or green mucus from her nose She notes that she smells a strange odor when she is at home only- however she is not sure if she is imagining this.  Her daughter is not able to appreciate any odor.  The smell is not present when she is outside of her home  cardiology Dr. Saunders Revel- is helping to adjust her BP meds  She is having some major dental surgery in September She notes that she may feel dizzy some of the time but thinks this may be due to her medications  She has seen neurology, had an MR of her brain back in 2017. She has not continued to follow-up with neurology as she did not think it was really necessary   She has noted a cough when she first rises in the am as well  She is still taking methotrexate- they plan to change to remicad infusions at some point soon she hopes   Lab Results  Component Value Date   TSH 0.70 03/19/2016    Patient Active Problem List   Diagnosis Date Noted  . High risk  medications (not anticoagulants) long-term use 01/16/2017  . Baker's cyst of knee, left 12/19/2016  . Pain in both knees 12/19/2016  . Pain of both elbows 12/19/2016  . Malignant melanoma ([place]) 12/19/2016  . Basal cell carcinoma 12/19/2016  . Upper airway cough syndrome 04/25/2016  . Hx of adenomatous colonic polyps 06/21/2015  . Hyperlipidemia 11/15/2014  . Rheumatoid arthritis ([place]) 09/01/2014  . Pericarditis 04/24/2011  . Aortic valve disorder 02/14/2009  . ELECTROCARDIOGRAM, ABNORMAL 02/14/2009  . PERNICIOUS ANEMIA 07/10/2007  . DEPRESSION 07/03/2007  . MALAISE AND FATIGUE 07/03/2007  . HYPOTHYROIDISM 04/30/2007  . Essential hypertension 04/30/2007  . Aneurysm of thoracic aorta ([place]) 02/13/2006    Past Medical History:  Diagnosis Date  . Adenomatous colon polyp 2012  . Allergy   . Anxiety   . Aortic insufficiency   . Arthritis   . Basal cell carcinoma   . Cataract   . Depression   . Diverticulosis   . Fibromyalgia   . Fibromyalgia   . Hyperlipidemia   . Hypertension   . Macular degeneration   . Melanoma ([place])   . Murmur, heart   . Reflux   . Stroke ([place])    per pt. mild stroke  . Thyroid disease     Past  Surgical History:  Procedure Laterality Date  . ABDOMINAL HYSTERECTOMY    . APPENDECTOMY    . BASAL CELL CARCINOMA EXCISION    . CARDIAC CATHETERIZATION    . CHOLECYSTECTOMY    . COLONOSCOPY     30 + years ago, unsure of where  . EYE SURGERY    . LASIK Bilateral   . MELANOMA EXCISION    . NASAL SEPTUM SURGERY    . TUBAL LIGATION      Social History  Substance Use Topics  . Smoking status: Former Smoker    Packs/day: 2.00    Years: 20.00    Types: Cigarettes    Quit date: 10/30/1983  . Smokeless tobacco: Never Used  . Alcohol use No    Family History  Problem Relation Age of Onset  . Heart disease Mother   . Hypertension Mother   . Mental illness Mother   . Allergies Mother   . Heart disease Father   . Hypertension Father   . Mental  illness Father   . Emphysema Father        smoked  . Allergies Father   . Heart attack Father   . Hyperlipidemia Sister   . Hypertension Sister   . Allergies Sister   . Hypertension Sister   . Diabetes Sister   . Allergies Sister   . Colon cancer Neg Hx   . Esophageal cancer Neg Hx   . Rectal cancer Neg Hx   . Stomach cancer Neg Hx     Allergies  Allergen Reactions  . Tramadol Other (See Comments)    dizzy    Medication list has been reviewed and updated.  Current Outpatient Prescriptions on File Prior to Visit  Medication Sig Dispense Refill  . albuterol (PROVENTIL HFA;VENTOLIN HFA) 108 (90 Base) MCG/ACT inhaler Inhale 2 puffs into the lungs every 6 (six) hours as needed for wheezing or shortness of breath. 18 g 2  . amLODipine (NORVASC) 10 MG tablet Take 1 tablet (10 mg total) by mouth daily. 30 tablet 11  . aspirin EC 81 MG tablet Take 1 tablet (81 mg total) by mouth daily. 90 tablet 3  . clonazePAM (KLONOPIN) 0.5 MG tablet Take 1 tablet (0.5 mg total) by mouth 2 (two) times daily as needed for anxiety. 30 tablet 2  . Cyanocobalamin (VITAMIN B 12 PO) Take 1 tablet by mouth daily.    . cycloSPORINE (RESTASIS) 0.05 % ophthalmic emulsion 1 drop 2 (two) times daily.    . famotidine (PEPCID) 20 MG tablet Take 1 tablet (20 mg total) by mouth at bedtime. 30 tablet 5  . fluticasone (FLONASE) 50 MCG/ACT nasal spray Place 2 sprays into both nostrils daily. 16 g 5  . folic acid (FOLVITE) 1 MG tablet TAKE 2 TABLETS BY MOUTH EVERY DAY 180 tablet 3  . ibuprofen (ADVIL,MOTRIN) 600 MG tablet One tablet three times daily x 3 weeks then stop 63 tablet 0  . irbesartan (AVAPRO) 300 MG tablet Take 1 tablet (300 mg total) by mouth daily. 30 tablet 11  . methotrexate 50 MG/2ML injection INJECT 0.8 ML UNDER THE SKIN ONCE EVERY WEEK 10 mL 0  . Multiple Vitamins-Minerals (PRESERVISION AREDS PO) Take by mouth. Reported on 02/15/2016    . Respiratory Therapy Supplies (FLUTTER) DEVI Use as directed 1  each 0  . rosuvastatin (CRESTOR) 20 MG tablet Take 1 tablet (20 mg total) by mouth daily. 30 tablet 11  . sertraline (ZOLOFT) 100 MG tablet Take 2 tablets (200 mg  total) by mouth daily. 180 tablet 3  . SYNTHROID 100 MCG tablet TAKE 1 TABLET BY MOUTH DAILY BEFORE BREAKFAST 90 tablet 3  . leflunomide (ARAVA) 10 MG tablet Take 1 tablet (10 mg total) by mouth daily. 90 tablet 0   No current facility-administered medications on file prior to visit.     Review of Systems:  As per HPI- otherwise negative. No fever No rash No nasuea, vomiting or diarrhea  Physical Examination: Vitals:   06/19/17 1351  BP: 126/79  Pulse: 93  Temp: 98.4 F (36.9 C)  SpO2: 97%   Vitals:   06/19/17 1351  Weight: 155 lb (70.3 kg)  Height: 5' (1.524 m)   Body mass index is 30.27 kg/m. Ideal Body Weight: Weight in (lb) to have BMI = 25: 127.7  GEN: WDWN, NAD, Non-toxic, A & O x 3, overweight, otherwise looks well HEENT: Atraumatic, Normocephalic. Neck supple. No masses, No LAD. Bilateral TM wnl, oropharynx normal.  PEERL,EOMI.   Ears and Nose: No external deformity. CV: RRR, No M/G/R. No JVD. No thrill. No extra heart sounds. PULM: CTA B, no wheezes, crackles, rhonchi. No retractions. No resp. distress. No accessory muscle use. ABD: S, NT, ND, +BS. No rebound. No HSM. EXTR: No c/c/e NEURO Normal gait.  PSYCH: Normally interactive. Conversant. Not depressed or anxious appearing.  Calm demeanor.    Assessment and Plan: Hypothyroidism due to acquired atrophy of thyroid - Plan: TSH  Cough  Nasal discharge  She would like to check her thyroid today which is fine- will run TSH for her Will plan further follow- up pending labs.  Otherwise at this time suspect a viral illness. She will let me know if not continuing to feel better over the next few days- in that case consider abx Signed Lamar Blinks, MD

## 2017-06-20 ENCOUNTER — Encounter: Payer: Self-pay | Admitting: Rheumatology

## 2017-06-20 ENCOUNTER — Ambulatory Visit (INDEPENDENT_AMBULATORY_CARE_PROVIDER_SITE_OTHER): Payer: Medicare Other | Admitting: Rheumatology

## 2017-06-20 VITALS — BP 144/87 | HR 86 | Resp 16 | Ht 59.75 in | Wt 156.0 lb

## 2017-06-20 DIAGNOSIS — C4491 Basal cell carcinoma of skin, unspecified: Secondary | ICD-10-CM | POA: Diagnosis not present

## 2017-06-20 DIAGNOSIS — M0579 Rheumatoid arthritis with rheumatoid factor of multiple sites without organ or systems involvement: Secondary | ICD-10-CM

## 2017-06-20 DIAGNOSIS — M797 Fibromyalgia: Secondary | ICD-10-CM | POA: Diagnosis not present

## 2017-06-20 DIAGNOSIS — C4330 Malignant melanoma of unspecified part of face: Secondary | ICD-10-CM | POA: Diagnosis not present

## 2017-06-20 DIAGNOSIS — R5383 Other fatigue: Secondary | ICD-10-CM | POA: Diagnosis not present

## 2017-06-20 DIAGNOSIS — Z79899 Other long term (current) drug therapy: Secondary | ICD-10-CM | POA: Diagnosis not present

## 2017-06-20 LAB — TSH: TSH: 1.13 u[IU]/mL (ref 0.35–4.50)

## 2017-06-20 MED ORDER — HYDROXYCHLOROQUINE SULFATE 200 MG PO TABS: 200.0000 mg | ORAL_TABLET | Freq: Every day | ORAL | 2 refills | Status: DC

## 2017-06-20 NOTE — Patient Instructions (Addendum)

## 2017-06-20 NOTE — Progress Notes (Signed)
Office Visit Note  Patient: Katherine Wang             Date of Birth: [birth year]           MRN: [id number]             PCP: [name], MD Referring: [name], MD Visit Date: 06/20/2017 Occupation: @GUAROCC @    Subjective:  Arthritis (Bil hand pain, right foot 2nd digit pain)  History of Present Illness: Katherine Wang is a 71 y.o. female  Who was last seen  January 18, 2017. At the last visit, the following notes were taken:====> Patient is currently doing a therapy of methotrexate 0.8 ML's weekly, folic acid 2 mg daily, Arava 10 mg daily. She is getting fair amount of relief with this dual therapy but patient reminds me that she is still having some symptoms and would love to be back on the Biologics.  She states that she found paperwork at her house that proves that she had a diagnosis of basal cell cancer in 2013 (she had originally told us it was diagnosed in 2014). As a result this coming September 2018, she will be 5 years s/p Basal Cell Cancer Diagnosis.  We discussed the importance of getting a statement from her doctor stating that she is cancer free and when the actual diagnosis was so that we can give her a biologic if appropriate. Patient also had melanoma approximately 2005 to the left side of her face. She is excited to get back on a biologic if appropriate.  Today, patient reports that she has ongoing discomfort to her joints. She points to bilateral MCP joints and right second PIP joint as a source of some of her pain and discomfort. Patient is not getting full relief with her current dual therapy of methotrexate 0 point mL's per week, folic acid 2 mg daily, Arava 10 mg daily. She would love to move onto a biologic if possible. She recalls that she failed Humira in the past as well as Enbrel. As a result she was on Remicade. The Remicade infusions worked very well for the patient and she is hoping that she can restart on the Remicade if  appropriate.  We have to get a letter from her dermatologist stating that she is cancer free and she is status post 5 years from her basal cell cancer diagnosis. She thinks she has paperwork at her house that proves that she had diagnosis of basal cell cancer in 2013. Him a early if she can switch will be starting September 2018 once insurance approves the biologic.  Activities of Daily Living:  Patient reports morning stiffness for 15 minutes.   Patient Reports nocturnal pain.  Difficulty dressing/grooming: Reports Difficulty climbing stairs: Reports Difficulty getting out of chair: Denies Difficulty using hands for taps, buttons, cutlery, and/or writing: Reports   Review of Systems  Constitutional: Negative for fatigue.  HENT: Negative for mouth sores and mouth dryness.   Eyes: Negative for dryness.  Respiratory: Negative for shortness of breath.   Gastrointestinal: Negative for constipation and diarrhea.  Musculoskeletal: Negative for myalgias and myalgias.  Skin: Negative for sensitivity to sunlight.  Psychiatric/Behavioral: Negative for decreased concentration and sleep disturbance.    PMFS History:  Patient Active Problem List   Diagnosis Date Noted  . High risk medications (not anticoagulants) long-term use 01/16/2017  . Baker's cyst of knee, left 12/19/2016  . Pain in both knees 12/19/2016  . Pain of both elbows 12/19/2016  .  Malignant melanoma ([place]) 12/19/2016  . Basal cell carcinoma 12/19/2016  . Upper airway cough syndrome 04/25/2016  . Hx of adenomatous colonic polyps 06/21/2015  . Hyperlipidemia 11/15/2014  . Rheumatoid arthritis ([place]) 09/01/2014  . Pericarditis 04/24/2011  . Aortic valve disorder 02/14/2009  . ELECTROCARDIOGRAM, ABNORMAL 02/14/2009  . PERNICIOUS ANEMIA 07/10/2007  . DEPRESSION 07/03/2007  . MALAISE AND FATIGUE 07/03/2007  . HYPOTHYROIDISM 04/30/2007  . Essential hypertension 04/30/2007  . Aneurysm of thoracic aorta ([place]) 02/13/2006     Past Medical History:  Diagnosis Date  . Adenomatous colon polyp 2012  . Allergy   . Anxiety   . Aortic insufficiency   . Arthritis   . Basal cell carcinoma   . Cataract   . Depression   . Diverticulosis   . Fibromyalgia   . Fibromyalgia   . Hyperlipidemia   . Hypertension   . Macular degeneration   . Melanoma ([place])   . Murmur, heart   . Reflux   . Stroke ([place])    per pt. mild stroke  . Thyroid disease     Family History  Problem Relation Age of Onset  . Heart disease Mother   . Hypertension Mother   . Mental illness Mother   . Allergies Mother   . Heart disease Father   . Hypertension Father   . Mental illness Father   . Emphysema Father        smoked  . Allergies Father   . Heart attack Father   . Hyperlipidemia Sister   . Hypertension Sister   . Allergies Sister   . Hypertension Sister   . Diabetes Sister   . Allergies Sister   . Colon cancer Neg Hx   . Esophageal cancer Neg Hx   . Rectal cancer Neg Hx   . Stomach cancer Neg Hx    Past Surgical History:  Procedure Laterality Date  . ABDOMINAL HYSTERECTOMY    . APPENDECTOMY    . BASAL CELL CARCINOMA EXCISION    . CARDIAC CATHETERIZATION    . CHOLECYSTECTOMY    . COLONOSCOPY     30 + years ago, unsure of where  . EYE SURGERY    . LASIK Bilateral   . MELANOMA EXCISION    . NASAL SEPTUM SURGERY    . TUBAL LIGATION     Social History   Social History Narrative   Lives alone in a one story home.  Has 2 living children.  Retired Engineer, water.       Objective: Vital Signs: BP (!) 144/87 (BP Location: Left Arm, Patient Position: Sitting, Cuff Size: Normal)   Pulse 86   Resp 16   Ht 4' 11.75" (1.518 m)   Wt 156 lb (70.8 kg)   BMI 30.72 kg/m    Physical Exam  Constitutional: She is oriented to person, place, and time. She appears well-developed and well-nourished.  HENT:  Head: Normocephalic and atraumatic.  Eyes: Pupils are equal, round, and reactive to light. EOM are normal.   Cardiovascular: Normal rate, regular rhythm and normal heart sounds.  Exam reveals no gallop and no friction rub.   No murmur heard. Pulmonary/Chest: Effort normal and breath sounds normal. She has no wheezes. She has no rales.  Abdominal: Soft. Bowel sounds are normal. She exhibits no distension. There is no tenderness. There is no guarding. No hernia.  Musculoskeletal: Normal range of motion. She exhibits no edema, tenderness or deformity.  Lymphadenopathy:    She has no cervical adenopathy.  Neurological: She  is alert and oriented to person, place, and time. Coordination normal.  Skin: Skin is warm and dry. Capillary refill takes less than 2 seconds. No rash noted.  Psychiatric: She has a normal mood and affect. Her behavior is normal.  Nursing note and vitals reviewed.    Musculoskeletal Exam:  Full range of motion of all joints Grip strength is equal and strong bilaterally For myalgia tender points are 6 of 18 positive.  CDAI Exam: CDAI Homunculus Exam:   Tenderness:  RUE: wrist LUE: wrist Right hand: 1st MCP, 2nd MCP and 3rd MCP Left hand: 1st MCP and 2nd MCP Right foot: 1st MTP, 2nd MTP, 3rd MTP, 4th MTP and 5th MTP Left foot: 1st MTP, 2nd MTP, 3rd MTP, 4th MTP and 5th MTP  Swelling:  Right hand: 1st MCP, 2nd MCP and 3rd MCP Left hand: 1st MCP and 2nd MCP  Joint Counts:  CDAI Tender Joint count: 7 CDAI Swollen Joint count: 5  Global Assessments:  Patient Global Assessment: 4 Provider Global Assessment: 4  CDAI Calculated Score: 20    Investigation: No additional findings. Office Visit on 06/19/2017  Component Date Value Ref Range Status  . TSH 06/19/2017 1.13  0.35 - 4.50 uIU/mL Final  Orders Only on 05/09/2017  Component Date Value Ref Range Status  . Sodium 05/09/2017 137  135 - 146 mmol/L Final  . Potassium 05/09/2017 4.7  3.5 - 5.3 mmol/L Final  . Chloride 05/09/2017 102  98 - 110 mmol/L Final  . CO2 05/09/2017 25  20 - 31 mmol/L Final  . Glucose,  Bld 05/09/2017 93  65 - 99 mg/dL Final  . BUN 05/09/2017 11  7 - 25 mg/dL Final  . Creat 05/09/2017 0.78  0.60 - 0.93 mg/dL Final   Comment:   For patients > or = 73 years of age: The upper reference limit for Creatinine is approximately 13% higher for people identified as African-American.     . Total Bilirubin 05/09/2017 0.6  0.2 - 1.2 mg/dL Final  . Alkaline Phosphatase 05/09/2017 95  33 - 130 U/L Final  . AST 05/09/2017 26  10 - 35 U/L Final  . ALT 05/09/2017 19  6 - 29 U/L Final  . Total Protein 05/09/2017 6.6  6.1 - 8.1 g/dL Final  . Albumin 05/09/2017 4.1  3.6 - 5.1 g/dL Final  . Calcium 05/09/2017 9.1  8.6 - 10.4 mg/dL Final  . WBC 05/09/2017 4.8  3.8 - 10.8 K/uL Final  . RBC 05/09/2017 4.69  3.80 - 5.10 MIL/uL Final  . Hemoglobin 05/09/2017 14.0  11.7 - 15.5 g/dL Final  . HCT 05/09/2017 42.5  35.0 - 45.0 % Final  . MCV 05/09/2017 90.6  80.0 - 100.0 fL Final  . MCH 05/09/2017 29.9  27.0 - 33.0 pg Final  . MCHC 05/09/2017 32.9  32.0 - 36.0 g/dL Final  . RDW 05/09/2017 15.1* 11.0 - 15.0 % Final  . Platelets 05/09/2017 231  140 - 400 K/uL Final  . MPV 05/09/2017 9.6  7.5 - 12.5 fL Final  . Neutro Abs 05/09/2017 2928  1,500 - 7,800 cells/uL Final  . Lymphs Abs 05/09/2017 1392  850 - 3,900 cells/uL Final  . Monocytes Absolute 05/09/2017 384  200 - 950 cells/uL Final  . Eosinophils Absolute 05/09/2017 96  15 - 500 cells/uL Final  . Basophils Absolute 05/09/2017 0  0 - 200 cells/uL Final  . Neutrophils Relative % 05/09/2017 61  % Final  . Lymphocytes Relative 05/09/2017 29  %  Final  . Monocytes Relative 05/09/2017 8  % Final  . Eosinophils Relative 05/09/2017 2  % Final  . Basophils Relative 05/09/2017 0  % Final  . Smear Review 05/09/2017 Criteria for review not met   Final  Lab on 04/29/2017  Component Date Value Ref Range Status  . Glucose 04/29/2017 83  65 - 99 mg/dL Final  . BUN 04/29/2017 11  8 - 27 mg/dL Final  . Creatinine, Ser 04/29/2017 0.83  0.57 - 1.00 mg/dL  Final  . GFR calc non Af Amer 04/29/2017 70  >59 mL/min/1.73 Final  . GFR calc Af Amer 04/29/2017 81  >59 mL/min/1.73 Final  . BUN/Creatinine Ratio 04/29/2017 13  12 - 28 Final  . Sodium 04/29/2017 140  134 - 144 mmol/L Final  . Potassium 04/29/2017 4.6  3.5 - 5.2 mmol/L Final  . Chloride 04/29/2017 100  96 - 106 mmol/L Final  . CO2 04/29/2017 22  20 - 29 mmol/L Final                 **Please note reference interval change**  . Calcium 04/29/2017 9.3  8.7 - 10.3 mg/dL Final  . Magnesium 04/29/2017 2.2  1.6 - 2.3 mg/dL Final  Office Visit on 01/18/2017  Component Date Value Ref Range Status  . Sodium 01/18/2017 138  135 - 146 mmol/L Final  . Potassium 01/18/2017 4.3  3.5 - 5.3 mmol/L Final  . Chloride 01/18/2017 103  98 - 110 mmol/L Final  . CO2 01/18/2017 28  20 - 31 mmol/L Final  . Glucose, Bld 01/18/2017 90  65 - 99 mg/dL Final  . BUN 01/18/2017 13  7 - 25 mg/dL Final  . Creat 01/18/2017 0.77  0.60 - 0.93 mg/dL Final   Comment:   For patients > or = 73 years of age: The upper reference limit for Creatinine is approximately 13% higher for people identified as African-American.     . Total Bilirubin 01/18/2017 0.6  0.2 - 1.2 mg/dL Final  . Alkaline Phosphatase 01/18/2017 87  33 - 130 U/L Final  . AST 01/18/2017 29  10 - 35 U/L Final  . ALT 01/18/2017 29  6 - 29 U/L Final  . Total Protein 01/18/2017 6.5  6.1 - 8.1 g/dL Final  . Albumin 01/18/2017 4.0  3.6 - 5.1 g/dL Final  . Calcium 01/18/2017 9.2  8.6 - 10.4 mg/dL Final  . WBC 01/18/2017 5.5  3.8 - 10.8 K/uL Final  . RBC 01/18/2017 4.56  3.80 - 5.10 MIL/uL Final  . Hemoglobin 01/18/2017 13.7  11.7 - 15.5 g/dL Final  . HCT 01/18/2017 41.3  35.0 - 45.0 % Final  . MCV 01/18/2017 90.6  80.0 - 100.0 fL Final  . MCH 01/18/2017 30.0  27.0 - 33.0 pg Final  . MCHC 01/18/2017 33.2  32.0 - 36.0 g/dL Final  . RDW 01/18/2017 17.2* 11.0 - 15.0 % Final  . Platelets 01/18/2017 307  140 - 400 K/uL Final  . MPV 01/18/2017 9.6  7.5 - 12.5 fL  Final  . Neutro Abs 01/18/2017 3795  1,500 - 7,800 cells/uL Final  . Lymphs Abs 01/18/2017 1210  850 - 3,900 cells/uL Final  . Monocytes Absolute 01/18/2017 385  200 - 950 cells/uL Final  . Eosinophils Absolute 01/18/2017 110  15 - 500 cells/uL Final  . Basophils Absolute 01/18/2017 0  0 - 200 cells/uL Final  . Neutrophils Relative % 01/18/2017 69  % Final  . Lymphocytes Relative 01/18/2017 22  %  Final  . Monocytes Relative 01/18/2017 7  % Final  . Eosinophils Relative 01/18/2017 2  % Final  . Basophils Relative 01/18/2017 0  % Final  . Smear Review 01/18/2017 Criteria for review not met   Final  . Vit D, 25-Hydroxy 01/18/2017 24* 30 - 100 ng/mL Final   Comment: Vitamin D Status           25-OH Vitamin D        Deficiency                <20 ng/mL        Insufficiency         20 - 29 ng/mL        Optimal             > or = 30 ng/mL   For 25-OH Vitamin D testing on patients on D2-supplementation and patients for whom quantitation of D2 and D3 fractions is required, the [facility] 25-OH VIT D, (D2,D3), LC/MS/MS is recommended: order code [phone] [phone] (patients > 2 yrs).      Imaging: No results found.  Speciality Comments: No specialty comments available.    Procedures:  No procedures performed Allergies: Tramadol   Assessment / Plan:     Visit Diagnoses: Rheumatoid arthritis involving multiple sites with positive rheumatoid factor (HCC)  High risk medications (not anticoagulants) long-term use  Fibromyalgia  Fatigue, unspecified type  Basal cell carcinoma, unspecified site - Pt reports Dx'd approx sept 2013; will provide medical records to confirm date.  Malignant melanoma of face excluding eyelid, nose, lip, and ear ([place]) - Dx'd 2005; occured to left side of face.   Plan: #1: Rheumatoid arthritis with positive rheumatoid factor. Multiple joint pain with synovitis including right first second and third MCP joint and left first and second MCP joint. All MCP with  tenderness, bilateral wrists with tenderness.  #2:Hx of Melanoma 2005 ==> not eligible for Biologics Hx of Basal cell cancer 2013  #3: High risk prescription On methotrexate 0.8 ML's per week Folic acid 2 mg per day Arava 10 mg daily (Inadequate response w/ some synovitis to few mcp joints (right first second and third MCP joint, left first and second MCP joints).  #4: Fibromyalgia syndrome 6 of 18 tender points  #5: some fatigue  #6:add plq 251m qd  Will print rx and pt will fill after plq baseline eye exam (will see [facility] and get plq eye exam) Pt was consented for plq and plq s/e were discussed w/ patient.  #7: rtc 4 months.     Orders: No orders of the defined types were placed in this encounter.  No orders of the defined types were placed in this encounter.   Face-to-face time spent with patient was 30 minutes. 50% of time was spent in counseling and coordination of care.  Follow-Up Instructions: No Follow-up on file.   NEliezer Lofts PA-C  Note - This record has been created using [employer]  Chart creation errors have been sought, but may not always  have been located. Such creation errors do not reflect on  the standard of medical care.

## 2017-06-24 ENCOUNTER — Other Ambulatory Visit: Payer: Self-pay

## 2017-07-02 ENCOUNTER — Ambulatory Visit: Payer: Self-pay | Admitting: Pharmacist

## 2017-07-02 NOTE — Progress Notes (Deleted)
Patient ID: Katherine Wang                 DOB: February 15, 1946                      MRN: [id number]     HPI: Katherine Wang is a 71 y.o. female referred by Dr. Saunders Revel to HTN clinic. 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 At last HTN visit 2 months ago, pt was started on amlodipine due to elevated BP of 142/84. Her valsartan was changed to irbesartan on 8/8 due to recall. Pt called clinic 1 week later with complaints of elevated BP in the 140s/80s and amlodipine was increased to 10mg  daily. Pt presents today for f/u.  Pt reports feeling well overall.  She denies dizziness, blurred vision, falls, or headache.  Home BP readings  -  Home cuff calibrated at last visit and was accurate.  Tolerating Crestor? Dr End to check at next appt Pt also has dietary indiscretion and eats a lot of sweets in her diet with minimal activity.  Start chlorthalidone if needed   Current HTN meds: irbesartan 300mg  daily (AM), amlodipine 10mg  daily (PM) BP goal: <130/6mmHg  Family History: Mother and father both with history of heart disease and HTN. Sister with HTN and DM.  Social History: Former smoker 2 PPD, quit in 1985. Denies alcohol and illicit drug use.  Diet: Likes proteins and carbohydrates. Breakfast - raisin bran cereal. Likes smaller more frequent snacks ~ every 2 hours. Snacks include: kettle potato chips, sweets, prunes, toast with jelly, salad, stew, beans, crackers with cheese or peanut butter. Does not add salt to her food. Drinks 3 small regular Cokes each day and 2 cups of caffeinated coffee each day.  Exercise: Has rheumatoid arthritis in her knees which limits walking.   Home BP readings:   Wt Readings from Last 3 Encounters:  06/20/17 156 lb (70.8 kg)  06/19/17 155 lb (70.3 kg)  04/15/17 157 lb 12.8 oz  (71.6 kg)   BP Readings from Last 3 Encounters:  06/20/17 (!) 144/87  06/19/17 126/79  04/29/17 (!) 142/84   Pulse Readings from Last 3 Encounters:  06/20/17 86  06/19/17 93  04/29/17 83    Renal function: CrCl cannot be calculated (Patient's most recent lab result is older than the maximum 21 days allowed.).  Past Medical History:  Diagnosis Date  . Adenomatous colon polyp 2012  . Allergy   . Anxiety   . Aortic insufficiency   . Arthritis   . Basal cell carcinoma   . Cataract   . Depression   . Diverticulosis   . Fibromyalgia   . Fibromyalgia   . Hyperlipidemia   . Hypertension   . Macular degeneration   . Melanoma ([place])   . Murmur, heart   . Reflux   . Stroke ([place])    per pt. mild stroke  . Thyroid disease     Current Outpatient Prescriptions on File Prior to Visit  Medication Sig Dispense Refill  . albuterol (PROVENTIL HFA;VENTOLIN HFA) 108 (90 Base) MCG/ACT inhaler Inhale 2 puffs into the lungs every 6 (six) hours as needed for wheezing or shortness of breath. 18 g 2  . amLODipine (NORVASC) 10 MG tablet Take 1 tablet (10 mg total) by mouth daily. 30 tablet 11  . aspirin EC 81 MG  tablet Take 1 tablet (81 mg total) by mouth daily. 90 tablet 3  . clonazePAM (KLONOPIN) 0.5 MG tablet Take 1 tablet (0.5 mg total) by mouth 2 (two) times daily as needed for anxiety. 30 tablet 2  . Cyanocobalamin (VITAMIN B 12 PO) Take 1 tablet by mouth daily.    . cycloSPORINE (RESTASIS) 0.05 % ophthalmic emulsion 1 drop 2 (two) times daily.    . famotidine (PEPCID) 20 MG tablet Take 1 tablet (20 mg total) by mouth at bedtime. 30 tablet 5  . fluticasone (FLONASE) 50 MCG/ACT nasal spray Place 2 sprays into both nostrils daily. 16 g 5  . folic acid (FOLVITE) 1 MG tablet TAKE 2 TABLETS BY MOUTH EVERY DAY 180 tablet 3  . hydroxychloroquine (PLAQUENIL) 200 MG tablet Take 1 tablet (200 mg total) by mouth daily. 30 tablet 2  . ibuprofen (ADVIL,MOTRIN) 600 MG tablet One tablet three times  daily x 3 weeks then stop 63 tablet 0  . irbesartan (AVAPRO) 300 MG tablet Take 1 tablet (300 mg total) by mouth daily. 30 tablet 11  . leflunomide (ARAVA) 10 MG tablet Take 1 tablet (10 mg total) by mouth daily. 90 tablet 0  . methotrexate 50 MG/2ML injection INJECT 0.8 ML UNDER THE SKIN ONCE EVERY WEEK 10 mL 0  . Multiple Vitamins-Minerals (PRESERVISION AREDS PO) Take by mouth. Reported on 02/15/2016    . Respiratory Therapy Supplies (FLUTTER) DEVI Use as directed 1 each 0  . rosuvastatin (CRESTOR) 20 MG tablet Take 1 tablet (20 mg total) by mouth daily. 30 tablet 11  . sertraline (ZOLOFT) 100 MG tablet Take 2 tablets (200 mg total) by mouth daily. 180 tablet 3  . SYNTHROID 100 MCG tablet TAKE 1 TABLET BY MOUTH DAILY BEFORE BREAKFAST 90 tablet 3   No current facility-administered medications on file prior to visit.     Allergies  Allergen Reactions  . Tramadol Other (See Comments)    dizzy     Assessment/Plan:

## 2017-07-03 ENCOUNTER — Other Ambulatory Visit: Payer: Self-pay | Admitting: Rheumatology

## 2017-07-03 NOTE — Telephone Encounter (Signed)
Last Visit: 06/20/17 Next Visit: 11/12/17 Labs: 05/09/17 WNL  Okay to refill per Dr. Estanislado Pandy

## 2017-07-22 ENCOUNTER — Ambulatory Visit: Payer: Self-pay | Admitting: Internal Medicine

## 2017-07-26 ENCOUNTER — Ambulatory Visit (INDEPENDENT_AMBULATORY_CARE_PROVIDER_SITE_OTHER): Payer: Medicare Other | Admitting: Internal Medicine

## 2017-07-26 ENCOUNTER — Encounter: Payer: Self-pay | Admitting: Internal Medicine

## 2017-07-26 VITALS — BP 142/84 | HR 108 | Ht 59.0 in | Wt 148.0 lb

## 2017-07-26 DIAGNOSIS — R000 Tachycardia, unspecified: Secondary | ICD-10-CM

## 2017-07-26 DIAGNOSIS — I10 Essential (primary) hypertension: Secondary | ICD-10-CM | POA: Diagnosis not present

## 2017-07-26 DIAGNOSIS — R0789 Other chest pain: Secondary | ICD-10-CM

## 2017-07-26 MED ORDER — NEBIVOLOL HCL 5 MG PO TABS: 5.0000 mg | ORAL_TABLET | Freq: Every day | ORAL | 6 refills | Status: DC

## 2017-07-26 NOTE — Progress Notes (Signed)
Follow-up Outpatient Visit Date: 07/26/2017  Primary Care Provider: [name], MD [address] [place] [address]  Chief Complaint: Elevated blood pressure  HPI:  Katherine Wang is a 70 y.o. year-old female with history of aortic regurgitation, mild ascending aortic dilation, suspected prior pericarditis, and rheumatoid arthritis, who presents for follow-up of hypertension and chest pain. I last saw her in June, which time she was doing well with the exception of occasional nonexertional chest pain. Her blood pressure was also suboptimally controlled at that time. He agreed to pursue aggressive blood pressure control, given negative ischemia workup as recently as 09/2017.  Katherine Wang is recovering from oral surgery last week, using APAP as needed for pain. She reports that her home BP has been mildly elevated since our last visit, with sytolic readings between 140 and 150 mmHg. She has also noticed that her HR on her BP cuff at home is often between 100 and 110 bpm. She also has occasional flutters in the chest without associated symptoms.  Katherine Wang denies chest pain. She has stable exertional dyspnea but denies orthopnea, PND, and edema.  q --------------------------------------------------------------------------------------------------  Cardiovascular History & Procedures: Cardiovascular Problems:  Recurrent pericarditis  Aortic regurgitation  Dilated thoracic aorta  Risk Factors:  Age 16  Cath/PCI:  LHC (2004, [place]): No significant CAD  CV Surgery:  None  EP Procedures and Devices:  None  Non-Invasive Evaluation(s):  Pharmacologic MPI (10/15/16): No risk study without ischemia. Small in size, moderate in severity mid and apical anterior fixed defect. LVEF 55%.  TTE (05/10/16): Normal LV size with mild focal basal hypertrophy of the septum. LVEF 65-70% with normal wall motion. Grade 1 diastolic dysfunction. Moderate aortic  regurgitation. Normal aortic root and ascending aortic size. Normal left atrial size. Normal RV size and function. Trivial TR. Normal pulmonary artery pressure.  MRI chest (12/30/14): Stable mild dilation of the descending thoracic aorta measuring 3.9 cm in greatest diameter. Stable aberrant right subclavian artery.  TTE (12/07/14): Normal LV size and contraction with LVEF of 50-55%. Grade 1 diastolic dysfunction. Moderate thickening and calcification of the aortic valve with moderate regurgitation. Mildly dilated ascending aorta measuring 4.0 cm. Mitral annular calcification with mild MR. Lipomatous hypertrophy of the septum. Mild TR. Normal RV size and function.  Cardiac MRI (03/07/09): Trileaflet aortic valve with mild dilation of the ascending aorta measuring 4.0 x 3.6 cm  Recent CV Pertinent Labs: Lab Results  Component Value Date   CHOL 147 04/20/2016   HDL 52 04/20/2016   LDLCALC 77 04/20/2016   TRIG 91 04/20/2016   CHOLHDL 2.8 04/20/2016   BNP 19.4 04/20/2016   K 4.7 05/09/2017   MG 2.2 04/29/2017   BUN 11 05/09/2017   BUN 11 04/29/2017   CREATININE 0.78 05/09/2017    Past medical and surgical history were reviewed and updated in EPIC.  Current Meds  Medication Sig  . albuterol (PROVENTIL HFA;VENTOLIN HFA) 108 (90 Base) MCG/ACT inhaler Inhale 2 puffs into the lungs every 6 (six) hours as needed for wheezing or shortness of breath.  Marland Kitchen amLODipine (NORVASC) 10 MG tablet Take 20 mg by mouth daily.  Marland Kitchen aspirin EC 81 MG tablet Take 1 tablet (81 mg total) by mouth daily.  . clonazePAM (KLONOPIN) 0.5 MG tablet Take 1 tablet (0.5 mg total) by mouth 2 (two) times daily as needed for anxiety.  . Cyanocobalamin (VITAMIN B 12 PO) Take 1 tablet by mouth daily.  . cycloSPORINE (RESTASIS) 0.05 %  ophthalmic emulsion 1 drop 2 (two) times daily.  . famotidine (PEPCID) 20 MG tablet Take 1 tablet (20 mg total) by mouth at bedtime.  . fluticasone (FLONASE) 50 MCG/ACT nasal spray Place 2 sprays into  both nostrils daily.  . folic acid (FOLVITE) 1 MG tablet TAKE 2 TABLETS BY MOUTH EVERY DAY  . ibuprofen (ADVIL,MOTRIN) 600 MG tablet One tablet three times daily x 3 weeks then stop  . irbesartan (AVAPRO) 300 MG tablet Take 1 tablet (300 mg total) by mouth daily.  Marland Kitchen leflunomide (ARAVA) 10 MG tablet TAKE 1 TABLET(10 MG) BY MOUTH DAILY  . methotrexate 50 MG/2ML injection INJECT 0.8 ML UNDER THE SKIN ONCE EVERY WEEK  . Multiple Vitamins-Minerals (PRESERVISION AREDS PO) Take by mouth. Reported on 02/15/2016  . Respiratory Therapy Supplies (FLUTTER) DEVI Use as directed  . rosuvastatin (CRESTOR) 20 MG tablet Take 1 tablet (20 mg total) by mouth daily.  . sertraline (ZOLOFT) 100 MG tablet Take 2 tablets (200 mg total) by mouth daily.  Marland Kitchen SYNTHROID 100 MCG tablet TAKE 1 TABLET BY MOUTH DAILY BEFORE BREAKFAST    Allergies: Tramadol  Social History   Social History  . Marital status: Divorced    Spouse name: N/A  . Number of children: 3  . Years of education: N/A   Occupational History  . retired    Social History Main Topics  . Smoking status: Former Smoker    Packs/day: 2.00    Years: 20.00    Types: Cigarettes    Quit date: 10/30/1983  . Smokeless tobacco: Never Used  . Alcohol use No  . Drug use: No  . Sexual activity: Not on file   Other Topics Concern  . Not on file   Social History Narrative   Lives alone in a one story home.  Has 2 living children.  Retired Engineer, water.      Family History  Problem Relation Age of Onset  . Heart disease Mother   . Hypertension Mother   . Mental illness Mother   . Allergies Mother   . Heart disease Father   . Hypertension Father   . Mental illness Father   . Emphysema Father        smoked  . Allergies Father   . Heart attack Father   . Hyperlipidemia Sister   . Hypertension Sister   . Allergies Sister   . Hypertension Sister   . Diabetes Sister   . Allergies Sister   . Colon cancer Neg Hx   . Esophageal cancer Neg Hx   .  Rectal cancer Neg Hx   . Stomach cancer Neg Hx     Review of Systems: A 12-system review of systems was performed and was negative except as noted in the HPI.  --------------------------------------------------------------------------------------------------  Physical Exam: BP 122/80   Pulse (!) 108   Ht 4\' 11"  (1.499 m)   Wt 148 lb (67.1 kg)   SpO2 98%   BMI 29.89 kg/m   General:  Overweight woman, seated comfortably in the exam room. HEENT: No conjunctival pallor or scleral icterus. Moist mucous membranes.  OP clear. Neck: Supple without lymphadenopathy, thyromegaly, JVD, or HJR. Lungs: Normal work of breathing. Clear to auscultation bilaterally without wheezes or crackles. Heart: Tachycardic but regular without murmurs, rubs, or gallops. Non-displaced PMI. Abd: Bowel sounds present. Soft, NT/ND without hepatosplenomegaly Ext: No lower extremity edema. Radial, PT, and DP pulses are 2+ bilaterally. Skin: Warm and dry without rash.  EKG:  Sinus arrhythmia with PACs (  HR 99 bpm). Inferolateral ST depressions and T-wave inversions, similar to prior tracings back to 2012.  Lab Results  Component Value Date   WBC 4.8 05/09/2017   HGB 14.0 05/09/2017   HCT 42.5 05/09/2017   MCV 90.6 05/09/2017   PLT 231 05/09/2017    Lab Results  Component Value Date   NA 137 05/09/2017   K 4.7 05/09/2017   CL 102 05/09/2017   CO2 25 05/09/2017   BUN 11 05/09/2017   CREATININE 0.78 05/09/2017   GLUCOSE 93 05/09/2017   ALT 19 05/09/2017    Lab Results  Component Value Date   CHOL 147 04/20/2016   HDL 52 04/20/2016   LDLCALC 77 04/20/2016   TRIG 91 04/20/2016   CHOLHDL 2.8 04/20/2016    --------------------------------------------------------------------------------------------------  ASSESSMENT AND PLAN: Hypertension BP suboptimally controlled. We will continue amlodipine and irbesartan as well as start nebivolol 5 mg daily, which will hopefully help improve her heart rate as  well.  Tachycardia Patient has noted recent tachycardia; EKG today shows sinus arrhythmia and PACs with HR ~100 bpm. Pain and stress response to recent oral surgery may be contributing. We have agreed to add nebivolol 5 mg daily; this medication was chosen due to patient's concern regarding potential mood effects from beta-blockers.  Atypical chest pain No recurrence since our last visit. EKG is abnormal with inferolateral ST/T changes, though similar to multiple prior tracings. We will add nebivolol, as above. No further workup at this time.  Follow-up: Return to hypertension clinic in 1 month; return to see me in 3 months.  Nelva Bush, MD 07/26/2017 9:28 AM

## 2017-07-26 NOTE — Patient Instructions (Signed)
Medication Instructions:  Please start Bystolic 5 mg a day. Continue all other medications as listed.  Follow-Up: Follow up in Hypertension Clinic in 1 month.  Follow up with Dr End in 3 months.  If you need a refill on your cardiac medications before your next appointment, please call your pharmacy.  Thank you for choosing [facility]!!

## 2017-08-05 ENCOUNTER — Ambulatory Visit (AMBULATORY_SURGERY_CENTER): Payer: Self-pay | Admitting: *Deleted

## 2017-08-05 VITALS — Ht 59.75 in | Wt 149.0 lb

## 2017-08-05 DIAGNOSIS — Z86010 Personal history of colonic polyps: Secondary | ICD-10-CM

## 2017-08-05 NOTE — Progress Notes (Addendum)
Patient denies any allergies to eggs or soy. Patient denies any problems with anesthesia/sedation. Patient denies any oxygen use at home. Patient denies taking any diet/weight loss medications or blood thinners. EMMI education assisgned to patient on colonoscopy, this was explained and instructions given to patient. Patient has Suprep at home from last appointment.

## 2017-08-19 ENCOUNTER — Ambulatory Visit (AMBULATORY_SURGERY_CENTER): Payer: Medicare Other | Admitting: Gastroenterology

## 2017-08-19 ENCOUNTER — Encounter: Payer: Self-pay | Admitting: Gastroenterology

## 2017-08-19 VITALS — BP 166/88 | HR 66 | Temp 97.1°F | Resp 19 | Ht 59.75 in | Wt 149.0 lb

## 2017-08-19 DIAGNOSIS — D125 Benign neoplasm of sigmoid colon: Secondary | ICD-10-CM

## 2017-08-19 DIAGNOSIS — D123 Benign neoplasm of transverse colon: Secondary | ICD-10-CM

## 2017-08-19 DIAGNOSIS — Z86010 Personal history of colonic polyps: Secondary | ICD-10-CM | POA: Diagnosis present

## 2017-08-19 DIAGNOSIS — Z1211 Encounter for screening for malignant neoplasm of colon: Secondary | ICD-10-CM | POA: Diagnosis not present

## 2017-08-19 MED ORDER — SODIUM CHLORIDE 0.9 % IV SOLN: 500.0000 mL | INTRAVENOUS | Status: DC

## 2017-08-19 NOTE — Patient Instructions (Signed)
**   Handouts given on polyps and diverticulosis **   YOU HAD AN ENDOSCOPIC PROCEDURE TODAY AT THE [facility]:   Refer to the procedure report that was given to you for any specific questions about what was found during the examination.  If the procedure report does not answer your questions, please call your gastroenterologist to clarify.  If you requested that your care partner not be given the details of your procedure findings, then the procedure report has been included in a sealed envelope for you to review at your convenience later.  YOU SHOULD EXPECT: Some feelings of bloating in the abdomen. Passage of more gas than usual.  Walking can help get rid of the air that was put into your GI tract during the procedure and reduce the bloating. If you had a lower endoscopy (such as a colonoscopy or flexible sigmoidoscopy) you may notice spotting of blood in your stool or on the toilet paper. If you underwent a bowel prep for your procedure, you may not have a normal bowel movement for a few days.  Please Note:  You might notice some irritation and congestion in your nose or some drainage.  This is from the oxygen used during your procedure.  There is no need for concern and it should clear up in a day or so.  SYMPTOMS TO REPORT IMMEDIATELY:   Following lower endoscopy (colonoscopy or flexible sigmoidoscopy):  Excessive amounts of blood in the stool  Significant tenderness or worsening of abdominal pains  Swelling of the abdomen that is new, acute  Fever of 100F or higher  For urgent or emergent issues, a gastroenterologist can be reached at any hour by calling [phone].   DIET:  We do recommend a small meal at first, but then you may proceed to your regular diet.  Drink plenty of fluids but you should avoid alcoholic beverages for 24 hours.  ACTIVITY:  You should plan to take it easy for the rest of today and you should NOT DRIVE or use heavy machinery until tomorrow (because  of the sedation medicines used during the test).    FOLLOW UP: Our staff will call the number listed on your records the next business day following your procedure to check on you and address any questions or concerns that you may have regarding the information given to you following your procedure. If we do not reach you, we will leave a message.  However, if you are feeling well and you are not experiencing any problems, there is no need to return our call.  We will assume that you have returned to your regular daily activities without incident.  If any biopsies were taken you will be contacted by phone or by letter within the next 1-3 weeks.  Please call us at [phone] if you have not heard about the biopsies in 3 weeks.    SIGNATURES/CONFIDENTIALITY: You and/or your care partner have signed paperwork which will be entered into your electronic medical record.  These signatures attest to the fact that that the information above on your After Visit Summary has been reviewed and is understood.  Full responsibility of the confidentiality of this discharge information lies with you and/or your care-partner. 

## 2017-08-19 NOTE — Progress Notes (Signed)
Pt's states no medical or surgical changes since previsit or office visit. maw 

## 2017-08-19 NOTE — Progress Notes (Signed)
Called to room to assist during endoscopic procedure.  Patient ID and intended procedure confirmed with present staff. Received instructions for my participation in the procedure from the performing physician.  

## 2017-08-19 NOTE — Progress Notes (Signed)
To PACU, VSS. Report to [email or web address] 

## 2017-08-19 NOTE — Op Note (Signed)
[facility] Patient Name: Katherine Wang Procedure Date: 08/19/2017 9:34 AM MRN: [id number] Endoscopist: Ladene Artist , MD Age: 71 Referring MD:  Date of Birth: February 20, 1946 Gender: Female Account #: [id number] Procedure:                Colonoscopy Indications:              Surveillance: Personal history of adenomatous                            polyps on last colonoscopy > 5 years ago Medicines:                Monitored Anesthesia Care Procedure:                Pre-Anesthesia Assessment:                           - Prior to the procedure, a History and Physical                            was performed, and patient medications and                            allergies were reviewed. The patient's tolerance of                            previous anesthesia was also reviewed. The risks                            and benefits of the procedure and the sedation                            options and risks were discussed with the patient.                            All questions were answered, and informed consent                            was obtained. Prior Anticoagulants: The patient has                            taken no previous anticoagulant or antiplatelet                            agents. ASA Grade Assessment: II - A patient with                            mild systemic disease. After reviewing the risks                            and benefits, the patient was deemed in                            satisfactory condition to undergo the procedure.  After obtaining informed consent, the colonoscope                            was passed under direct vision. Throughout the                            procedure, the patient's blood pressure, pulse, and                            oxygen saturations were monitored continuously. The                            Model [id number]-[id number] scope was introduced                            through the anus and  advanced to the the cecum,                            identified by appendiceal orifice and ileocecal                            valve. The ileocecal valve, appendiceal orifice,                            and rectum were photographed. The quality of the                            bowel preparation was excellent. The colonoscopy                            was performed without difficulty. The patient                            tolerated the procedure well. Scope In: 9:43:07 AM Scope Out: 9:58:36 AM Scope Withdrawal Time: 0 hours 11 minutes 59 seconds  Total Procedure Duration: 0 hours 15 minutes 29 seconds  Findings:                 The perianal and digital rectal examinations were                            normal.                           A 7 mm polyp was found in the sigmoid colon. The                            polyp was sessile. The polyp was removed with a                            cold snare. Resection and retrieval were complete.                           A 5 mm polyp was found in the transverse colon. The  polyp was sessile. The polyp was removed with a                            cold biopsy forceps. Resection and retrieval were                            complete.                           Many medium-mouthed diverticula were found in the                            left colon. There was no evidence of diverticular                            bleeding.                           A few small-mouthed diverticula were found in the                            ascending colon. There was no evidence of                            diverticular bleeding.                           The exam was otherwise without abnormality on                            direct and retroflexion views. Complications:            No immediate complications. Estimated blood loss:                            None. Estimated Blood Loss:     Estimated blood loss: none. Impression:                - One 7 mm polyp in the sigmoid colon, removed with                            a cold snare. Resected and retrieved.                           - One 5 mm polyp in the transverse colon, removed                            with a cold biopsy forceps. Resected and retrieved.                           - Moderate diverticulosis in the left colon. There                            was no evidence of diverticular bleeding.                           -  Mild diverticulosis in the ascending colon. There                            was no evidence of diverticular bleeding.                           - The examination was otherwise normal on direct                            and retroflexion views. Recommendation:           - Repeat colonoscopy in 5 years for surveillance.                           - Patient has a contact number available for                            emergencies. The signs and symptoms of potential                            delayed complications were discussed with the                            patient. Return to normal activities tomorrow.                            Written discharge instructions were provided to the                            patient.                           - Continue present medications.                           - High fiber diet indefinitely.                           - Await pathology. Ladene Artist, MD 08/19/2017 10:02:44 AM This report has been signed electronically.

## 2017-08-20 ENCOUNTER — Telehealth: Payer: Self-pay | Admitting: *Deleted

## 2017-08-20 NOTE — Telephone Encounter (Signed)
  Follow up Call-  Call back number 08/19/2017  Post procedure Call Back phone  # [phone] cell  Permission to leave phone message Yes  Some recent data might be hidden     Patient questions:  Do you have a fever, pain , or abdominal swelling? No. Pain Score  0 *  Have you tolerated food without any problems? Yes.    Have you been able to return to your normal activities? Yes.    Do you have any questions about your discharge instructions: Diet   No. Medications  No. Follow up visit  No.  Do you have questions or concerns about your Care? No.  Actions: * If pain score is 4 or above: No action needed, pain <4.

## 2017-08-23 DIAGNOSIS — M1711 Unilateral primary osteoarthritis, right knee: Secondary | ICD-10-CM | POA: Diagnosis not present

## 2017-08-23 DIAGNOSIS — M25561 Pain in right knee: Secondary | ICD-10-CM | POA: Diagnosis not present

## 2017-08-23 DIAGNOSIS — G8929 Other chronic pain: Secondary | ICD-10-CM | POA: Diagnosis not present

## 2017-08-23 DIAGNOSIS — M25461 Effusion, right knee: Secondary | ICD-10-CM | POA: Diagnosis not present

## 2017-08-27 ENCOUNTER — Ambulatory Visit: Payer: Self-pay

## 2017-08-27 NOTE — Progress Notes (Deleted)
Patient ID: LISETTE Wang                 DOB: 1946/08/10                      MRN: [id number]     HPI: Katherine Wang is a 71 y.o. female referred by Dr. Saunders Revel to HTN clinic. 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 At ast HTN clinic visit in July 2018, amlodipine was started. Patient was affected by valsartan recall and she was switched to irbesartan in August 2018. At last office visit with Dr. Saunders Revel, patient complained of elevated BP and HR at home and BP was found to be elevated in office so she was started on nebivolol. EKG showed sinus rhythm and PACs, pain and stress response from recent oral surgery may have been contributing. Home BP cuff has measured accurately in previous HTN clinic visits.   Dizziness, blurred vision, falls, HA Home BP readings Myalgias?? We also saw her for this in July and she was switched to crestor 20 -lipid panel ordered    Current HTN meds: Irbesartan 300 mg daily, amlodipine 10 mg daily, nebivolol 5 mg daily BP goal: <130/57mmHg  Family History: Mother and father both with history of heart disease and HTN. Sister with HTN and DM.  Social History: Former smoker 2 PPD, quit in 1985. Denies alcohol and illicit drug use.  Diet: Likes proteins and carbohydrates. Breakfast - raisin bran cereal. Likes smaller more frequent snacks ~ every 2 hours. Snacks include: kettle potato chips, sweets, prunes, toast with jelly, salad, stew, beans, crackers with cheese or peanut butter. Does not add salt to her food. Drinks 3 small regular Cokes each day and 2 cups of caffeinated coffee each day.  Exercise: Has rheumatoid arthritis in her knees which limits walking.   Home BP readings:  Wt Readings from Last 3 Encounters:  08/19/17 149 lb (67.6 kg)  08/05/17 149 lb (67.6 kg)  07/26/17 148  lb (67.1 kg)   BP Readings from Last 3 Encounters:  08/19/17 (!) 166/88  07/26/17 (!) 142/84  06/20/17 (!) 144/87   Pulse Readings from Last 3 Encounters:  08/19/17 66  07/26/17 (!) 108  06/20/17 86    Renal function: CrCl cannot be calculated (Patient's most recent lab result is older than the maximum 21 days allowed.).  Past Medical History:  Diagnosis Date  . Adenomatous colon polyp 2012  . Allergy   . Anxiety   . Aortic insufficiency   . Arthritis   . Basal cell carcinoma   . Cataract   . Depression   . Diverticulosis   . Fibromyalgia   . Fibromyalgia   . Hyperlipidemia   . Hypertension   . Macular degeneration   . Melanoma ([place])   . Murmur, heart   . Reflux   . Stroke ([place]) 20 years ago   per pt. mild stroke  . Thyroid disease    hypothroidism     Current Outpatient Prescriptions on File Prior to Visit  Medication Sig Dispense Refill  . albuterol (PROVENTIL HFA;VENTOLIN HFA) 108 (90 Base) MCG/ACT inhaler Inhale 2 puffs into the lungs every 6 (six) hours as needed for wheezing or shortness of breath. 18 g 2  . amLODipine (NORVASC) 5 MG tablet Take 5 mg by mouth daily.     Marland Kitchen  aspirin EC 81 MG tablet Take 1 tablet (81 mg total) by mouth daily. 90 tablet 3  . clonazePAM (KLONOPIN) 0.5 MG tablet Take 1 tablet (0.5 mg total) by mouth 2 (two) times daily as needed for anxiety. 30 tablet 2  . Cyanocobalamin (VITAMIN B 12 PO) Take 1 tablet by mouth daily.    . cycloSPORINE (RESTASIS) 0.05 % ophthalmic emulsion 1 drop 2 (two) times daily.    . famotidine (PEPCID) 20 MG tablet Take 1 tablet (20 mg total) by mouth at bedtime. 30 tablet 5  . fluticasone (FLONASE) 50 MCG/ACT nasal spray Place 2 sprays into both nostrils daily. 16 g 5  . folic acid (FOLVITE) 1 MG tablet TAKE 2 TABLETS BY MOUTH EVERY DAY 180 tablet 3  . ibuprofen (ADVIL,MOTRIN) 600 MG tablet One tablet three times daily x 3 weeks then stop 63 tablet 0  . irbesartan (AVAPRO) 300 MG tablet Take 1 tablet (300 mg  total) by mouth daily. 30 tablet 11  . leflunomide (ARAVA) 10 MG tablet TAKE 1 TABLET(10 MG) BY MOUTH DAILY 90 tablet 0  . methotrexate 50 MG/2ML injection INJECT 0.8 ML UNDER THE SKIN ONCE EVERY WEEK 10 mL 0  . Multiple Vitamins-Minerals (PRESERVISION AREDS PO) Take by mouth. Reported on 02/15/2016    . nebivolol (BYSTOLIC) 5 MG tablet Take 1 tablet (5 mg total) by mouth daily. (Patient not taking: Reported on 08/19/2017) 30 tablet 6  . Respiratory Therapy Supplies (FLUTTER) DEVI Use as directed (Patient not taking: Reported on 08/19/2017) 1 each 0  . rosuvastatin (CRESTOR) 20 MG tablet Take 1 tablet (20 mg total) by mouth daily. 30 tablet 11  . sertraline (ZOLOFT) 100 MG tablet Take 2 tablets (200 mg total) by mouth daily. 180 tablet 3  . SYNTHROID 100 MCG tablet TAKE 1 TABLET BY MOUTH DAILY BEFORE BREAKFAST 90 tablet 3   No current facility-administered medications on file prior to visit.     Allergies  Allergen Reactions  . Tramadol Other (See Comments)    dizzy     Assessment/Plan:  1. Hypertension -   2. Hyperlipidemia -

## 2017-08-29 ENCOUNTER — Telehealth: Payer: Self-pay | Admitting: Family Medicine

## 2017-08-29 ENCOUNTER — Other Ambulatory Visit: Payer: Self-pay | Admitting: Family Medicine

## 2017-08-29 MED ORDER — CLONAZEPAM 0.5 MG PO TABS
0.5000 mg | ORAL_TABLET | Freq: Two times a day (BID) | ORAL | 2 refills | Status: DC | PRN
Start: 2017-08-29 — End: 2017-11-22

## 2017-08-29 NOTE — Telephone Encounter (Signed)
[facility]: filled on 10/4- no unexpected entries Last visit here in August Ok to refill

## 2017-08-29 NOTE — Telephone Encounter (Signed)
Patient requesting refill on clonazePAM (KLONOPIN) 0.5 MG tablet she states that Rx is requesting approval for refill.

## 2017-09-02 ENCOUNTER — Telehealth: Payer: Self-pay

## 2017-09-02 NOTE — Telephone Encounter (Signed)
Patient has been scheduled for 09/06/17 at 1:75 pm.

## 2017-09-02 NOTE — Telephone Encounter (Signed)
Patient called stating that her left knee is very swollen and would like to come in for a cort. Injection.  CB# is [phone].  Please advise.  Thank You.

## 2017-09-03 ENCOUNTER — Other Ambulatory Visit: Payer: Self-pay | Admitting: Emergency Medicine

## 2017-09-03 DIAGNOSIS — R058 Other specified cough: Secondary | ICD-10-CM

## 2017-09-03 DIAGNOSIS — R05 Cough: Secondary | ICD-10-CM

## 2017-09-03 MED ORDER — FAMOTIDINE 20 MG PO TABS: 20.0000 mg | ORAL_TABLET | Freq: Every day | ORAL | 5 refills | Status: DC

## 2017-09-06 ENCOUNTER — Ambulatory Visit: Payer: Medicare Other | Admitting: Rheumatology

## 2017-09-06 ENCOUNTER — Ambulatory Visit: Payer: Self-pay

## 2017-09-06 ENCOUNTER — Telehealth: Payer: Self-pay

## 2017-09-06 ENCOUNTER — Encounter: Payer: Self-pay | Admitting: Gastroenterology

## 2017-09-06 ENCOUNTER — Ambulatory Visit: Payer: Self-pay | Admitting: Rheumatology

## 2017-09-06 NOTE — Telephone Encounter (Signed)
Patient called stating that her knee was feeling better and didn't think that she needed to come to her appt.

## 2017-09-06 NOTE — Progress Notes (Signed)
Patient ID: DOLL FRAZEE                 DOB: Apr 21, 1946                      MRN: [id number]     HPI: Katherine Wang is a 71 y.o. female referred by Dr. Saunders Revel to HTN clinic. 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 Pt seen in HTN clinic 04/2017 and amlodipine was added to valsartan, which was later changed to irbesartan due to recall. At last office visit pt was hypertensive and had tachycardia with PACs noted on EKG so nebivolol was started by Dr. Saunders Revel.  Pt presents in ambulating in good spirits. She states that she took nebivolol once but then it made her feel weak so she stopped taking it. Pt reports that she has not been taking her BP at home as regularly. Pt reports she has lost ~12 pounds and has been compliant with a low Na diet and started walking more. Pt endorses cutting back on sweets and salty snacks and rarely drinks soda now.   Current HTN meds: amlodipine 5mg  daily (pm), irbesartan 300mg  daily (am), nebivolol 5mg  daily (pt not taking due to weakness after 1st dose)  Previously tried: valsartan (recall)  BP goal: <130/80 mmHg  Family History: CAD (mother, father), HTN (mother), DM (sister)  Social History: former smoker (quit 1985). Denies alcohol or illicit drug use.  Diet: Likes proteins and carbohydrates. Breakfast - raisin bran cereal. Likes smaller more frequent snacks ~ every 2 hours. Snacks include: kettle potato chips, sweets, prunes, toast with jelly, salad, stew, beans, crackers with cheese or peanut butter. Does not add salt to her food. Drinks 3 small regular Cokes each day and 2 cups of caffeinated coffee each day.   Exercise: Has rheumatoid arthritis in her knees which limits walking but she been able to walk more lately.  Home BP readings: Not measuring at home regularly. Two readings  available: 07/31/17 BP 118/78 mmHg, 08/04/17 BP 135/84 mmHg  Wt Readings from Last 3 Encounters:  08/19/17 149 lb (67.6 kg)  08/05/17 149 lb (67.6 kg)  07/26/17 148 lb (67.1 kg)   BP Readings from Last 3 Encounters:  08/19/17 (!) 166/88  07/26/17 (!) 142/84  06/20/17 (!) 144/87   Pulse Readings from Last 3 Encounters:  08/19/17 66  07/26/17 (!) 108  06/20/17 86    Renal function: CrCl cannot be calculated (Patient's most recent lab result is older than the maximum 21 days allowed.).  Past Medical History:  Diagnosis Date  . Adenomatous colon polyp 2012  . Allergy   . Anxiety   . Aortic insufficiency   . Arthritis   . Basal cell carcinoma   . Cataract   . Depression   . Diverticulosis   . Fibromyalgia   . Fibromyalgia   . Hyperlipidemia   . Hypertension   . Macular degeneration   . Melanoma ([place])   . Murmur, heart   . Reflux   . Stroke ([place]) 20 years ago   per pt. mild stroke  . Thyroid disease    hypothroidism     Current Outpatient Medications on File Prior to Visit  Medication Sig Dispense Refill  . albuterol (PROVENTIL HFA;VENTOLIN HFA) 108 (90 Base) MCG/ACT inhaler Inhale 2 puffs into the lungs every 6 (six) hours  as needed for wheezing or shortness of breath. 18 g 2  . amLODipine (NORVASC) 5 MG tablet Take 5 mg by mouth daily.     Marland Kitchen aspirin EC 81 MG tablet Take 1 tablet (81 mg total) by mouth daily. 90 tablet 3  . clonazePAM (KLONOPIN) 0.5 MG tablet Take 1 tablet (0.5 mg total) by mouth 2 (two) times daily as needed for anxiety. 30 tablet 2  . Cyanocobalamin (VITAMIN B 12 PO) Take 1 tablet by mouth daily.    . cycloSPORINE (RESTASIS) 0.05 % ophthalmic emulsion 1 drop 2 (two) times daily.    . famotidine (PEPCID) 20 MG tablet Take 1 tablet (20 mg total) at bedtime by mouth. 30 tablet 5  . fluticasone (FLONASE) 50 MCG/ACT nasal spray Place 2 sprays into both nostrils daily. 16 g 5  . folic acid (FOLVITE) 1 MG tablet TAKE 2 TABLETS BY MOUTH EVERY DAY 180 tablet  3  . ibuprofen (ADVIL,MOTRIN) 600 MG tablet One tablet three times daily x 3 weeks then stop 63 tablet 0  . irbesartan (AVAPRO) 300 MG tablet Take 1 tablet (300 mg total) by mouth daily. 30 tablet 11  . leflunomide (ARAVA) 10 MG tablet TAKE 1 TABLET(10 MG) BY MOUTH DAILY 90 tablet 0  . methotrexate 50 MG/2ML injection INJECT 0.8 ML UNDER THE SKIN ONCE EVERY WEEK 10 mL 0  . Multiple Vitamins-Minerals (PRESERVISION AREDS PO) Take by mouth. Reported on 02/15/2016    . nebivolol (BYSTOLIC) 5 MG tablet Take 1 tablet (5 mg total) by mouth daily. (Patient not taking: Reported on 08/19/2017) 30 tablet 6  . Respiratory Therapy Supplies (FLUTTER) DEVI Use as directed (Patient not taking: Reported on 08/19/2017) 1 each 0  . rosuvastatin (CRESTOR) 20 MG tablet Take 1 tablet (20 mg total) by mouth daily. 30 tablet 11  . sertraline (ZOLOFT) 100 MG tablet Take 2 tablets (200 mg total) by mouth daily. 180 tablet 3  . SYNTHROID 100 MCG tablet TAKE 1 TABLET BY MOUTH DAILY BEFORE BREAKFAST 90 tablet 3   No current facility-administered medications on file prior to visit.     Allergies  Allergen Reactions  . Tramadol Other (See Comments)    dizzy     Assessment/Plan:  1. Hypertension - BP uncontrolled and above goal of <130/80 mmHg in clinic at 142/90 mmHg likely secondary to non-compliance with Bystolic. Pt counseled on AE of beta-blockers and advised that symptoms of fatigue/weakness are common but typically resolve within 1-2 weeks. Pt agreeable to resuming Bystolic 5mg  daily, will plan to continue amlodipine 5mg  daily and irbesartan 300mg  daily. Pt will take Bystolic in the evening to counteract symptoms. Pt encouraged to continue healthy diet/walking and begin measuring BP at home more regularly. F/U in clinic in 4 weeks.  Arrie Senate, PharmD PGY-2 Cardiology Pharmacy Resident Pager: [phone] 09/09/2017

## 2017-09-09 ENCOUNTER — Ambulatory Visit (INDEPENDENT_AMBULATORY_CARE_PROVIDER_SITE_OTHER): Payer: Medicare Other | Admitting: Pharmacist

## 2017-09-09 VITALS — BP 142/90 | HR 93

## 2017-09-09 DIAGNOSIS — I10 Essential (primary) hypertension: Secondary | ICD-10-CM

## 2017-09-09 NOTE — Patient Instructions (Signed)
It was great to meet you today!  Continue taking your amlodipine 5mg  daily and irbesartan 300mg  daily.  Resume taking nebivolol (Bystolic) 5mg  daily at night.  Follow-up in HTN clinic in 4 weeks.

## 2017-09-11 ENCOUNTER — Telehealth: Payer: Self-pay | Admitting: Internal Medicine

## 2017-09-11 ENCOUNTER — Encounter: Payer: Self-pay | Admitting: Pharmacist

## 2017-09-11 NOTE — Telephone Encounter (Signed)
Pt was seen in the hypertensive clinic on 09/09/17. Pt was recommended then to restart Bystolic 5 mg at night and to take the Amlodipine 5 mg and the Irbesartan 300 mg in the AM. Pt states that since restart on Bystolic ,her heart rate is running 66 to 67 beats/minute which usually runs in the 90's and she feels weak and dizzy. Pt would like to know if she can take half of the 5 mg Bystolic tablet instead. Pt is aware that I will send this message  to the hypertensive clinic for recommendations.

## 2017-09-11 NOTE — Telephone Encounter (Signed)
Returned patient's call regarding HR in the 60s and feeling weak and dizzy. Pt was restarted on Bystolic 5mg  daily on 11/12 - will reduce dose to 2.5mg  daily and have pt continue to monitor her HR and symptoms. Encouraged pt to call office if symptoms continue or worsen with lower dose. Pt will keep F/U appointment with HTN clinic on 10/08/17.

## 2017-09-11 NOTE — Telephone Encounter (Signed)
This encounter was created in error - please disregard.

## 2017-09-11 NOTE — Telephone Encounter (Signed)
Katherine Wang is calling because she started taking Bystolic a couple days ago and her heart rate has dropped and she is sort of weak and dizzy . Wants to know should she change the dosage of the medication . Please call

## 2017-09-25 NOTE — Progress Notes (Signed)
[place] at [facility] [address] [place], [place] [address] [phone]  Date:  09/26/2017   Name:  Katherine Wang   DOB:  14-Jul-1946   MRN:  [id number]  PCP:  [name], MD    Chief Complaint: Sore Throat (c/o sore throat, and low grade fever. Pt states that prod cough with green mucus x 3-4 weeks. )   History of Present Illness:  Katherine Wang is a 71 y.o. very pleasant female patient who presents with the following:  Here today for a sick visit- I last saw her in August Norvasc, avapro, arava, methotrexate, bystolic, zoloft, synthroid, crestor   She had a good thanksgiving holiday She notes that over the last 3-4 weeks she has "been bringing up thick, chunky, green nasty stuff from her lungs" However she did not feel bad really until the last week or so - then she started to feel a bit sick and worn out She does have some discharge from her sinuses. But no pressure or pain in her face She has noted a low grade fever up to about 100 sometimes at night No GI symptoms She has tried some zyrtec OTC She notes an occasional right sided earache Sneezing over the last couple of days only   She does have RA treated with arava and methotrexate- she takes her dose on thursdays (today) but has not taken yet today, she will hold while she is sick and using abx   She has been working on losing weight-  Is pleased with her progress so far   Wt Readings from Last 3 Encounters:  09/26/17 144 lb 6.4 oz (65.5 kg)  08/19/17 149 lb (67.6 kg)  08/05/17 149 lb (67.6 kg)       Patient Active Problem List   Diagnosis Date Noted  . Tachycardia 07/26/2017  . Atypical chest pain 07/26/2017  . High risk medications (not anticoagulants) long-term use 01/16/2017  . Baker's cyst of knee, left 12/19/2016  . Pain in both knees 12/19/2016  . Pain of both elbows 12/19/2016  . Malignant melanoma ([place]) 12/19/2016  . Basal cell  carcinoma 12/19/2016  . Chronic pansinusitis 08/15/2016  . Deviated septum 08/15/2016  . Epistaxis 08/15/2016  . Hypertrophy, nasal, turbinate 08/15/2016  . Upper airway cough syndrome 04/25/2016  . Hx of adenomatous colonic polyps 06/21/2015  . Hyperlipidemia 11/15/2014  . Rheumatoid arthritis ([place]) 09/01/2014  . Pericarditis 04/24/2011  . Aortic valve disorder 02/14/2009  . ELECTROCARDIOGRAM, ABNORMAL 02/14/2009  . PERNICIOUS ANEMIA 07/10/2007  . DEPRESSION 07/03/2007  . MALAISE AND FATIGUE 07/03/2007  . HYPOTHYROIDISM 04/30/2007  . Essential hypertension 04/30/2007  . Aneurysm of thoracic aorta ([place]) 02/13/2006    Past Medical History:  Diagnosis Date  . Adenomatous colon polyp 2012  . Allergy   . Anxiety   . Aortic insufficiency   . Arthritis   . Basal cell carcinoma   . Cataract   . Depression   . Diverticulosis   . Fibromyalgia   . Fibromyalgia   . Hyperlipidemia   . Hypertension   . Macular degeneration   . Melanoma ([place])   . Murmur, heart   . Reflux   . Stroke ([place]) 20 years ago   per pt. mild stroke  . Thyroid disease    hypothroidism     Past Surgical History:  Procedure Laterality Date  . ABDOMINAL HYSTERECTOMY    . APPENDECTOMY    . BASAL  CELL CARCINOMA EXCISION    . CARDIAC CATHETERIZATION    . CHOLECYSTECTOMY    . COLONOSCOPY     30 + years ago, unsure of where  . EYE SURGERY    . LASIK Bilateral   . MELANOMA EXCISION    . NASAL SEPTUM SURGERY    . TUBAL LIGATION      Social History   Tobacco Use  . Smoking status: Former Smoker    Packs/day: 2.00    Years: 20.00    Pack years: 40.00    Types: Cigarettes    Last attempt to quit: 10/30/1983    Years since quitting: 33.9  . Smokeless tobacco: Never Used  Substance Use Topics  . Alcohol use: No    Alcohol/week: 0.0 oz  . Drug use: No    Family History  Problem Relation Age of Onset  . Heart disease Mother   . Hypertension Mother   . Mental illness Mother   . Allergies Mother    . Heart disease Father   . Hypertension Father   . Mental illness Father   . Emphysema Father        smoked  . Allergies Father   . Heart attack Father   . Hyperlipidemia Sister   . Hypertension Sister   . Allergies Sister   . Hypertension Sister   . Diabetes Sister   . Allergies Sister   . Colon cancer Neg Hx   . Esophageal cancer Neg Hx   . Rectal cancer Neg Hx   . Stomach cancer Neg Hx   . Pancreatic cancer Neg Hx   . Prostate cancer Neg Hx     Allergies  Allergen Reactions  . Tramadol Other (See Comments)    dizzy    Medication list has been reviewed and updated.  Current Outpatient Medications on File Prior to Visit  Medication Sig Dispense Refill  . albuterol (PROVENTIL HFA;VENTOLIN HFA) 108 (90 Base) MCG/ACT inhaler Inhale 2 puffs into the lungs every 6 (six) hours as needed for wheezing or shortness of breath. 18 g 2  . amLODipine (NORVASC) 5 MG tablet Take 5 mg by mouth daily.     Marland Kitchen aspirin EC 81 MG tablet Take 1 tablet (81 mg total) by mouth daily. 90 tablet 3  . clonazePAM (KLONOPIN) 0.5 MG tablet Take 1 tablet (0.5 mg total) by mouth 2 (two) times daily as needed for anxiety. 30 tablet 2  . Cyanocobalamin (VITAMIN B 12 PO) Take 1 tablet by mouth daily.    . cycloSPORINE (RESTASIS) 0.05 % ophthalmic emulsion 1 drop 2 (two) times daily.    . famotidine (PEPCID) 20 MG tablet Take 1 tablet (20 mg total) at bedtime by mouth. 30 tablet 5  . fluticasone (FLONASE) 50 MCG/ACT nasal spray Place 2 sprays into both nostrils daily. 16 g 5  . folic acid (FOLVITE) 1 MG tablet TAKE 2 TABLETS BY MOUTH EVERY DAY 180 tablet 3  . ibuprofen (ADVIL,MOTRIN) 600 MG tablet One tablet three times daily x 3 weeks then stop 63 tablet 0  . irbesartan (AVAPRO) 300 MG tablet Take 1 tablet (300 mg total) by mouth daily. 30 tablet 11  . leflunomide (ARAVA) 10 MG tablet TAKE 1 TABLET(10 MG) BY MOUTH DAILY 90 tablet 0  . Multiple Vitamins-Minerals (PRESERVISION AREDS PO) Take by mouth. Reported  on 02/15/2016    . nebivolol (BYSTOLIC) 5 MG tablet Take 1 tablet (5 mg total) by mouth daily. 30 tablet 6  . Respiratory Therapy Supplies (  FLUTTER) DEVI Use as directed 1 each 0  . sertraline (ZOLOFT) 100 MG tablet Take 2 tablets (200 mg total) by mouth daily. 180 tablet 3  . SYNTHROID 100 MCG tablet TAKE 1 TABLET BY MOUTH DAILY BEFORE BREAKFAST 90 tablet 3  . rosuvastatin (CRESTOR) 20 MG tablet Take 1 tablet (20 mg total) by mouth daily. 30 tablet 11   No current facility-administered medications on file prior to visit.     Review of Systems:  As per HPI- otherwise negative. No chills No rash No GI symptoms    Physical Examination: Vitals:   09/26/17 1358  BP: 130/82  Pulse: 71  Temp: 97.6 F (36.4 C)  SpO2: 96%   Vitals:   09/26/17 1358  Weight: 144 lb 6.4 oz (65.5 kg)  Height: 4\' 11"  (1.499 m)   Body mass index is 29.17 kg/m. Ideal Body Weight: Weight in (lb) to have BMI = 25: 123.5  GEN: WDWN, NAD, Non-toxic, A & O x 3, overweight, looks well HEENT: Atraumatic, Normocephalic. Neck supple. No masses, No LAD.  Bilateral TM wnl, oropharynx normal.  PEERL,EOMI.  Nasal cavity is inflamed and congested  Ears and Nose: No external deformity. CV: RRR, No M/G/R. No JVD. No thrill. No extra heart sounds. PULM: CTA B, no wheezes, crackles, rhonchi. No retractions. No resp. distress. No accessory muscle use. ABD: S, NT, ND, +BS. No rebound. No HSM. EXTR: No c/c/e NEURO Normal gait.  PSYCH: Normally interactive. Conversant. Not depressed or anxious appearing.  Calm demeanor.    Assessment and Plan: Acute bronchitis, unspecified organism - Plan: doxycycline (VIBRAMYCIN) 100 MG capsule  Acute non-recurrent frontal sinusitis  Here today with sinusitis, bronchitis Treat with doxycycline She will hold her methotrexate during abx treatment  She will alert me if not feeling better in the next few days- Sooner if worse.    Signed Lamar Blinks, MD

## 2017-09-26 ENCOUNTER — Ambulatory Visit (INDEPENDENT_AMBULATORY_CARE_PROVIDER_SITE_OTHER): Payer: Medicare Other | Admitting: Family Medicine

## 2017-09-26 ENCOUNTER — Encounter: Payer: Self-pay | Admitting: Family Medicine

## 2017-09-26 ENCOUNTER — Other Ambulatory Visit: Payer: Self-pay | Admitting: Rheumatology

## 2017-09-26 VITALS — BP 130/82 | HR 71 | Temp 97.6°F | Ht 59.0 in | Wt 144.4 lb

## 2017-09-26 DIAGNOSIS — J209 Acute bronchitis, unspecified: Secondary | ICD-10-CM

## 2017-09-26 DIAGNOSIS — J0110 Acute frontal sinusitis, unspecified: Secondary | ICD-10-CM | POA: Diagnosis not present

## 2017-09-26 MED ORDER — DOXYCYCLINE HYCLATE 100 MG PO CAPS: 100.0000 mg | ORAL_CAPSULE | Freq: Two times a day (BID) | ORAL | 0 refills | Status: DC

## 2017-09-26 NOTE — Telephone Encounter (Signed)
Last Visit: 06/20/17 Next Visit: 11/12/17  Okay to refill per Dr. Estanislado Pandy

## 2017-09-26 NOTE — Patient Instructions (Signed)
It was good to see you today but I am sorry that you are sick!  We are going to treat you with doxycycline for sinus infection/ bronchitis You can also hold your methotrexate while you are on the doxycycline  Please let me know if you are not improving over the next several days- Sooner if worse.

## 2017-09-26 NOTE — Telephone Encounter (Signed)
Last Visit: 06/20/17 Next Visit: 11/12/17 Labs: 05/09/17 WNL  Left message to advise patient she is due to update labs.   Okay to refill 30 day supply per Dr. Estanislado Pandy

## 2017-10-03 ENCOUNTER — Telehealth: Payer: Self-pay | Admitting: Family Medicine

## 2017-10-03 NOTE — Telephone Encounter (Signed)
Copied from [place]. Topic: Quick Communication - See Telephone Encounter >> Oct 03, 2017  1:06 PM Hewitt Shorts wrote: CRM for notification. See Telephone encounter for: pt is calling to let the provider know that this is day 7 on doxycyline and she still has the cough and colored mucus and would like to know what to do next    Best number [phone]    10/03/17.

## 2017-10-08 ENCOUNTER — Ambulatory Visit: Payer: Self-pay

## 2017-10-29 NOTE — Progress Notes (Addendum)
Office Visit Note  Patient: Katherine Wang             Date of Birth: Apr 12, 1946           MRN: [id number]             PCP: [name], MD Referring: [name], MD Visit Date: 11/12/2017 Occupation: @GUAROCC @    Subjective:  Other (neck, BIL arm pain and left knee swelling/pain )   History of Present Illness: Katherine Wang is a 72 y.o. female with rheumatoid arthritis and osteoarthritis. She states she's not doing well. She's been having arm discomfort in her C-spine. She also had recent pain and swelling in her right knee joint for which she was seen by an orthopedic doctor where she had knee joint aspiration and knee injection. She has some discomfort in her bilateral hands over her MCP joints. Her left knee keeps swelling as well. She experiences achiness in her arms.  Activities of Daily Living:  Patient reports morning stiffness for all day hours.   Patient Reports nocturnal pain.  Difficulty dressing/grooming: Denies Difficulty climbing stairs: Reports Difficulty getting out of chair: Denies Difficulty using hands for taps, buttons, cutlery, and/or writing: Reports   Review of Systems  Constitutional: Positive for fatigue. Negative for night sweats, weight gain, weight loss and weakness.  HENT: Negative for mouth sores, trouble swallowing, trouble swallowing, mouth dryness and nose dryness.   Eyes: Positive for dryness. Negative for pain, redness and visual disturbance.  Respiratory: Negative for cough, shortness of breath and difficulty breathing.   Cardiovascular: Negative for chest pain, palpitations, hypertension, irregular heartbeat and swelling in legs/feet.  Gastrointestinal: Negative for blood in stool, constipation and diarrhea.  Endocrine: Negative for increased urination.  Genitourinary: Negative for vaginal dryness.  Musculoskeletal: Positive for arthralgias, joint pain, joint swelling, myalgias, morning stiffness and myalgias. Negative for  muscle weakness and muscle tenderness.  Skin: Negative for color change, rash, hair loss, skin tightness, ulcers and sensitivity to sunlight.  Allergic/Immunologic: Negative for susceptible to infections.  Neurological: Negative for dizziness, memory loss and night sweats.  Hematological: Negative for swollen glands.  Psychiatric/Behavioral: Positive for depressed mood. Negative for sleep disturbance. The patient is nervous/anxious.     PMFS History:  Patient Active Problem List   Diagnosis Date Noted  . Moderate aortic regurgitation 11/02/2017  . Lightheadedness 11/02/2017  . Tachycardia 07/26/2017  . Atypical chest pain 07/26/2017  . High risk medications (not anticoagulants) long-term use 01/16/2017  . Baker's cyst of knee, left 12/19/2016  . Pain in both knees 12/19/2016  . Pain of both elbows 12/19/2016  . Malignant melanoma ([place]) 12/19/2016  . Basal cell carcinoma 12/19/2016  . Chronic pansinusitis 08/15/2016  . Deviated septum 08/15/2016  . Epistaxis 08/15/2016  . Hypertrophy, nasal, turbinate 08/15/2016  . Upper airway cough syndrome 04/25/2016  . Hx of adenomatous colonic polyps 06/21/2015  . Hyperlipidemia 11/15/2014  . Rheumatoid arthritis ([place]) 09/01/2014  . Pericarditis 04/24/2011  . Aortic valve disorder 02/14/2009  . ELECTROCARDIOGRAM, ABNORMAL 02/14/2009  . PERNICIOUS ANEMIA 07/10/2007  . DEPRESSION 07/03/2007  . MALAISE AND FATIGUE 07/03/2007  . HYPOTHYROIDISM 04/30/2007  . Essential hypertension 04/30/2007  . Aneurysm of thoracic aorta ([place]) 02/13/2006    Past Medical History:  Diagnosis Date  . Adenomatous colon polyp 2012  . Allergy   . Anxiety   . Aortic insufficiency   . Arthritis   . Basal cell carcinoma   . Cataract   . Depression   .  Diverticulosis   . Fibromyalgia   . Fibromyalgia   . Hyperlipidemia   . Hypertension   . Macular degeneration   . Melanoma ([place])   . Murmur, heart   . Reflux   . Stroke ([place]) 20 years ago   per pt. mild  stroke  . Thyroid disease    hypothroidism     Family History  Problem Relation Age of Onset  . Heart disease Mother   . Hypertension Mother   . Mental illness Mother   . Allergies Mother   . Heart disease Father   . Hypertension Father   . Mental illness Father   . Emphysema Father        smoked  . Allergies Father   . Heart attack Father   . Hyperlipidemia Sister   . Hypertension Sister   . Allergies Sister   . Hypertension Sister   . Diabetes Sister   . Allergies Sister   . Colon cancer Neg Hx   . Esophageal cancer Neg Hx   . Rectal cancer Neg Hx   . Stomach cancer Neg Hx   . Pancreatic cancer Neg Hx   . Prostate cancer Neg Hx    Past Surgical History:  Procedure Laterality Date  . ABDOMINAL HYSTERECTOMY    . APPENDECTOMY    . BASAL CELL CARCINOMA EXCISION    . CARDIAC CATHETERIZATION    . CHOLECYSTECTOMY    . COLONOSCOPY     30 + years ago, unsure of where  . EYE SURGERY    . LASIK Bilateral   . MELANOMA EXCISION    . NASAL SEPTUM SURGERY    . TUBAL LIGATION     Social History   Social History Narrative   Lives alone in a one story home.  Has 2 living children.  Retired Engineer, water.       Objective: Vital Signs: BP 137/79 (BP Location: Left Arm, Patient Position: Sitting, Cuff Size: Normal)   Pulse 71   Resp 16   Ht 4' 11.75" (1.518 m)   Wt 146 lb (66.2 kg)   BMI 28.75 kg/m    Physical Exam  Constitutional: She is oriented to person, place, and time. She appears well-developed and well-nourished.  HENT:  Head: Normocephalic and atraumatic.  Eyes: Conjunctivae and EOM are normal.  Neck: Normal range of motion.  Cardiovascular: Normal rate, regular rhythm, normal heart sounds and intact distal pulses.  Pulmonary/Chest: Effort normal and breath sounds normal.  Abdominal: Soft. Bowel sounds are normal.  Lymphadenopathy:    She has no cervical adenopathy.  Neurological: She is alert and oriented to person, place, and time.  Skin: Skin is warm  and dry. Capillary refill takes less than 2 seconds.  Psychiatric: She has a normal mood and affect. Her behavior is normal.  Nursing note and vitals reviewed.    Musculoskeletal Exam: She is limited range of motion of her C-spine with discomfort. She is thoracic kyphosis. She is limitation of range of motion of her lumbar spine. She has discomfort range of motion of her left shoulder although it was full range of motion. Elbow joints wrist joints are good range of motion. She is some synovial thickening over MCPs and PIPs with no synovitis noted. Hip joints are good range of motion. Her right knee joint was good range of motion. With no warmth swelling or effusion. She is some warmth on palpation of her left knee joint. Some MTP and PIP thickening was noted with no synovitis.  CDAI Exam: CDAI Homunculus Exam:   Tenderness:  Right hand: 2nd MCP Left hand: 2nd MCP RLE: tibiofemoral LLE: tibiofemoral  Swelling:  LLE: tibiofemoral  Joint Counts:  CDAI Tender Joint count: 4 CDAI Swollen Joint count: 1  Global Assessments:  Patient Global Assessment: 6 Provider Global Assessment: 5  CDAI Calculated Score: 16    Investigation: Findings:  She brought x-rays with her from 08/23/2017 which showed bilateral moderate medial compartment narrowing more prominent in her left pain in the right knee. Intercondylar osteophytes were noted. No chondrocalcinosis was noted.  CBC Latest Ref Rng & Units 11/08/2017 05/09/2017 01/18/2017  WBC 3.8 - 10.8 Thousand/uL 3.6(L) 4.8 5.5  Hemoglobin 11.7 - 15.5 g/dL 12.7 14.0 13.7  Hematocrit 35.0 - 45.0 % 37.2 42.5 41.3  Platelets 140 - 400 Thousand/uL 231 231 307   CMP Latest Ref Rng & Units 11/08/2017 05/09/2017 04/29/2017  Glucose 65 - 99 mg/dL 96 93 83  BUN 7 - 25 mg/dL 15 11 11   Creatinine 0.60 - 0.93 mg/dL 0.91 0.78 0.83  Sodium 135 - 146 mmol/L 139 137 140  Potassium 3.5 - 5.3 mmol/L 3.9 4.7 4.6  Chloride 98 - 110 mmol/L 103 102 100  CO2 20 - 32  mmol/L 28 25 22   Calcium 8.6 - 10.4 mg/dL 9.3 9.1 9.3  Total Protein 6.1 - 8.1 g/dL 6.2 6.6 -  Total Bilirubin 0.2 - 1.2 mg/dL 0.5 0.6 -  Alkaline Phos 33 - 130 U/L - 95 -  AST 10 - 35 U/L 19 26 -  ALT 6 - 29 U/L 16 19 -    Imaging: Xr Cervical Spine 2 Or 3 Views  Result Date: 11/12/2017 Multilevel spondylosis with C4-5, C5-6, C6-7 narrowing mild facet joint arthropathy. Impression: These findings are consistent with DDD of the cervical spine.   Speciality Comments: No specialty comments available.    Procedures:  No procedures performed Allergies: Tramadol   Assessment / Plan:     Visit Diagnoses: Rheumatoid arthritis involving multiple sites with positive rheumatoid factor (HCC) - +RF, -CCP, -ANA . She continues to have some activity. She has ongoing pain and discomfort in her knee joints. She is some stiffness in her hands. She states she had recent aspiration and injection of her right knee joint. She's having some discomfort in her left knee joint. She declined injection today.  High risk medication use - MTX 0.8 ml sq qwk, Arava 10 mg po qd , folic acid 2mg  po qd. Her labs are stable she has low white cell count due to immunosuppression. We will continue to monitor labs every 3 months.  Cervical pain (neck) -she's been having increased pain and discomfort in her C-spine.: XR Cervical Spine 2 or 3 views were consistent with DDD of cervical spine. I have for physical therapy but she declined.  Primary osteoarthritis of both hands: She's been having the stiffness and discomfort in her bilateral hands  . Primary osteoarthritis of both knees: She's chronic pain in her bilateral knee joints.  History of vitamin D deficiency: She is on supplement.  History of melanoma: She was treated with Remicade in the past which worked really well for her. But due to history of melanoma she had to come off on the Biologics. We discussed the possible use of Rituxan and the side effects were  reviewed. At this point she does not want to proceed with the medication.  Other medical problems are listed as follows:  History of neuropathy  History of depression  History of anxiety  History of hypertension  History of hypercholesterolemia  History of macular degeneration  History of asthma  History of hypothyroidism  History of cholecystectomy  History of basal cell carcinoma  Aortic valve disorder  History of anemia    Orders: Orders Placed This Encounter  Procedures  . XR Cervical Spine 2 or 3 views   No orders of the defined types were placed in this encounter.   Face-to-face time spent with patient was 30 minutes. Greater than 50% of time was spent in counseling and coordination of care.  Follow-Up Instructions: Return in about 4 months (around 03/12/2018) for Rheumatoid arthritis.   Bo Merino, MD  Note - This record has been created using Editor, commissioning.  Chart creation errors have been sought, but may not always  have been located. Such creation errors do not reflect on  the standard of medical care.

## 2017-11-01 ENCOUNTER — Ambulatory Visit (INDEPENDENT_AMBULATORY_CARE_PROVIDER_SITE_OTHER): Payer: Medicare Other | Admitting: Internal Medicine

## 2017-11-01 ENCOUNTER — Encounter: Payer: Self-pay | Admitting: Internal Medicine

## 2017-11-01 VITALS — BP 136/80 | HR 74 | Ht 59.0 in | Wt 143.2 lb

## 2017-11-01 DIAGNOSIS — I10 Essential (primary) hypertension: Secondary | ICD-10-CM

## 2017-11-01 DIAGNOSIS — R42 Dizziness and giddiness: Secondary | ICD-10-CM

## 2017-11-01 DIAGNOSIS — I351 Nonrheumatic aortic (valve) insufficiency: Secondary | ICD-10-CM | POA: Diagnosis not present

## 2017-11-01 NOTE — Progress Notes (Signed)
Follow-up Outpatient Visit Date: 11/01/2017  Primary Care Provider: [name], [address] [place] [address]  Chief Complaint: Blood pressure problems and left arm pain  HPI:  Katherine Wang is a 72 y.o. year-old female with history of  aortic regurgitation, mild ascending aortic dilation, suspected prior pericarditis, and rheumatoid arth, who presents for follow-up of chest pain and hypertension. I last saw Katherine Wang in September, at which time she was recovering from recent oral surgery. Her blood pressure had been somewhat elevated. We agreed to add nebivolol, though Katherine Wang stopped it after one dose due to "weakness." She followed up in the hypertension clinic in early November, which time her blood pressure was still suboptimally controlled. She was agreeable to trying nebivolol again, though she still felt somewhat weak and dizzy. She was advised to decrease nebivolol to 2.5 mg daily.  Today, Katherine Wang notes that her blood pressures have fluctuated.  In the mornings, in particular, her blood pressure is typically elevated around 160 or 767 mmHg systolic.  She wonders if this could be related to previous recommendations to take amlodipine and irbesartan in the morning and nebivolol in the evening.  Prior to initiation of nebivolol at our last visit, she was taking irbesartan in the morning and amlodipine in the evening.  She seems to be tolerating nebivolol 2.5 mg daily okay.  She did not tolerate 5 mg daily due to fatigue and dizziness.  Katherine Wang is concerned about some occasional lightheadedness.  This seems to be most pronounced when she leans forward and looks upward.  She has noticed this for many years and has never undergone further evaluation.  She denies chest pain and palpitations.  Dyspnea on exertion is stable.  She notes occasional achiness in the left upper arm, predominantly when she lays down at night.  She wonders if this could be a cardiac  problem.  She has noted minimal swelling in her ankles.  --------------------------------------------------------------------------------------------------  Cardiovascular History & Procedures: Cardiovascular Problems:  Recurrent pericarditis  Aortic regurgitation  Dilated thoracic aorta  Risk Factors:  Age 69  Cath/PCI:  LHC (2004, [place]): No significant CAD  CV Surgery:  None  EP Procedures and Devices:  None  Non-Invasive Evaluation(s):  Pharmacologic MPI (10/15/16): No risk study without ischemia. Small in size, moderate in severity mid and apical anterior fixed defect. LVEF 55%.  TTE (05/10/16): Normal LV size with mild focal basal hypertrophy of the septum. LVEF 65-70% with normal wall motion. Grade 1 diastolic dysfunction. Moderate aortic regurgitation. Normal aortic root and ascending aortic size. Normal left atrial size. Normal RV size and function. Trivial TR. Normal pulmonary artery pressure.  MRI chest (12/30/14): Stable mild dilation of the descending thoracic aorta measuring 3.9 cm in greatest diameter. Stable aberrant right subclavian artery.  TTE (12/07/14): Normal LV size and contraction with LVEF of 50-55%. Grade 1 diastolic dysfunction. Moderate thickening and calcification of the aortic valve with moderate regurgitation. Mildly dilated ascending aorta measuring 4.0 cm. Mitral annular calcification with mild MR. Lipomatous hypertrophy of the septum. Mild TR. Normal RV size and function.  Cardiac MRI (03/07/09): Trileaflet aortic valve with mild dilation of the ascending aorta measuring 4.0 x 3.6 cm  Recent CV Pertinent Labs: Lab Results  Component Value Date   CHOL 147 04/20/2016   HDL 52 04/20/2016   LDLCALC 77 04/20/2016   TRIG 91 04/20/2016   CHOLHDL 2.8 04/20/2016   BNP 19.4 04/20/2016   K 4.7  05/09/2017   MG 2.2 04/29/2017   BUN 11 05/09/2017   BUN 11 04/29/2017   CREATININE 0.78 05/09/2017    Past medical and surgical history were  reviewed and updated in EPIC.  Current Meds  Medication Sig  . albuterol (PROVENTIL HFA;VENTOLIN HFA) 108 (90 Base) MCG/ACT inhaler Inhale 2 puffs into the lungs every 6 (six) hours as needed for wheezing or shortness of breath.  Marland Kitchen amLODipine (NORVASC) 5 MG tablet Take 5 mg by mouth daily.   Marland Kitchen aspirin EC 81 MG tablet Take 1 tablet (81 mg total) by mouth daily.  Marland Kitchen BYSTOLIC 5 MG tablet Take 2.5 mg by mouth daily.  . clonazePAM (KLONOPIN) 0.5 MG tablet Take 1 tablet (0.5 mg total) by mouth 2 (two) times daily as needed for anxiety.  . Cyanocobalamin (VITAMIN B 12 PO) Take 1 tablet by mouth daily.  . cycloSPORINE (RESTASIS) 0.05 % ophthalmic emulsion 1 drop 2 (two) times daily.  . famotidine (PEPCID) 20 MG tablet Take 1 tablet (20 mg total) at bedtime by mouth.  . fluticasone (FLONASE) 50 MCG/ACT nasal spray Place 2 sprays into both nostrils daily.  . folic acid (FOLVITE) 1 MG tablet TAKE 2 TABLETS BY MOUTH EVERY DAY  . ibuprofen (ADVIL,MOTRIN) 600 MG tablet One tablet three times daily x 3 weeks then stop  . irbesartan (AVAPRO) 300 MG tablet Take 1 tablet (300 mg total) by mouth daily.  Marland Kitchen leflunomide (ARAVA) 10 MG tablet TAKE 1 TABLET(10 MG) BY MOUTH DAILY  . methotrexate 50 MG/2ML injection ADMINISTER 0.8 ML UNDER THE SKIN 1 TIME EVERY WEEK  . Respiratory Therapy Supplies (FLUTTER) DEVI Use as directed  . SAFETY-LOK TB SYRINGE 27GX.5" 27G X 1/2" 1 ML MISC USE AS DIRECTED  . sertraline (ZOLOFT) 100 MG tablet Take 2 tablets (200 mg total) by mouth daily.  Marland Kitchen SYNTHROID 100 MCG tablet TAKE 1 TABLET BY MOUTH DAILY BEFORE BREAKFAST    Allergies: Tramadol  Social History   Socioeconomic History  . Marital status: Divorced    Spouse name: Not on file  . Number of children: 3  . Years of education: Not on file  . Highest education level: Not on file  Social Needs  . Financial resource strain: Not on file  . Food insecurity - worry: Not on file  . Food insecurity - inability: Not on file  .  Transportation needs - medical: Not on file  . Transportation needs - non-medical: Not on file  Occupational History  . Occupation: retired  Tobacco Use  . Smoking status: Former Smoker    Packs/day: 2.00    Years: 20.00    Pack years: 40.00    Types: Cigarettes    Last attempt to quit: 10/30/1983    Years since quitting: 34.0  . Smokeless tobacco: Never Used  Substance and Sexual Activity  . Alcohol use: No    Alcohol/week: 0.0 oz  . Drug use: No  . Sexual activity: Not on file  Other Topics Concern  . Not on file  Social History Narrative   Lives alone in a one story home.  Has 2 living children.  Retired Engineer, water.      Family History  Problem Relation Age of Onset  . Heart disease Mother   . Hypertension Mother   . Mental illness Mother   . Allergies Mother   . Heart disease Father   . Hypertension Father   . Mental illness Father   . Emphysema Father  smoked  . Allergies Father   . Heart attack Father   . Hyperlipidemia Sister   . Hypertension Sister   . Allergies Sister   . Hypertension Sister   . Diabetes Sister   . Allergies Sister   . Colon cancer Neg Hx   . Esophageal cancer Neg Hx   . Rectal cancer Neg Hx   . Stomach cancer Neg Hx   . Pancreatic cancer Neg Hx   . Prostate cancer Neg Hx     Review of Systems: Review of Systems  Constitutional: Negative.   Respiratory: Positive for cough and shortness of breath.   Gastrointestinal: Positive for constipation.  Musculoskeletal: Positive for joint pain and myalgias.  Neurological: Positive for dizziness.  Psychiatric/Behavioral: Positive for depression. The patient is nervous/anxious.    --------------------------------------------------------------------------------------------------  Physical Exam: BP 136/80   Pulse 74   Ht 4\' 11"  (1.499 m)   Wt 143 lb 3.2 oz (65 kg)   BMI 28.92 kg/m   General: Overweight, elderly woman, seated comfortably in the exam room. HEENT: No conjunctival  pallor or scleral icterus. Moist mucous membranes.  OP clear. Neck: Supple without lymphadenopathy, thyromegaly, JVD, or HJR. No carotid bruit. Lungs: Normal work of breathing. Clear to auscultation bilaterally without wheezes or crackles. Heart: Regular rate and rhythm without murmurs, rubs, or gallops. Non-displaced PMI. Abd: Bowel sounds present. Soft, NT/ND without hepatosplenomegaly Ext: No lower extremity edema. Radial, PT, and DP pulses are 2+ bilaterally. Skin: Warm and dry without rash.  Lab Results  Component Value Date   WBC 4.8 05/09/2017   HGB 14.0 05/09/2017   HCT 42.5 05/09/2017   MCV 90.6 05/09/2017   PLT 231 05/09/2017    Lab Results  Component Value Date   NA 137 05/09/2017   K 4.7 05/09/2017   CL 102 05/09/2017   CO2 25 05/09/2017   BUN 11 05/09/2017   CREATININE 0.78 05/09/2017   GLUCOSE 93 05/09/2017   ALT 19 05/09/2017    Lab Results  Component Value Date   CHOL 147 04/20/2016   HDL 52 04/20/2016   LDLCALC 77 04/20/2016   TRIG 91 04/20/2016   CHOLHDL 2.8 04/20/2016    --------------------------------------------------------------------------------------------------  ASSESSMENT AND PLAN: Hypertension Blood pressure control is better today, though it is still upper normal to mildly elevated.  I have advised Katherine Wang that it is okay for her to take amlodipine in the evenings if she feels that this will give her more balanced blood pressure control throughout the day.  Aortic regurgitation Patient noted to have moderate aortic regurgitation on prior echoes, most recently 04/2016.  We will repeat an echo at her earliest convenience to reassess this.  Lightheadedness This is most often positional when the patient looks upward and is leaning forward.  It has been present for many years.  Though this is unusual for carotid stenosis, vertebrobasilar insufficiency is a consideration.  We have agreed to begin with carotid Dopplers for further assessment.   If these are unrevealing and her symptoms persist, MRA of the neck may be needed.  Follow-up: Return to clinic in 3 months.  Nelva Bush, MD 11/01/2017 11:54 AM

## 2017-11-01 NOTE — Patient Instructions (Signed)
Medication Instructions:  It is fine to take your irbesartan in the morning and your amlodipine and Bystolic in the evening.  Labwork: None  Testing/Procedures: Your physician has requested that you have an echocardiogram. Echocardiography is a painless test that uses sound waves to create images of your heart. It provides your doctor with information about the size and shape of your heart and how well your heart's chambers and valves are working. This procedure takes approximately one hour. There are no restrictions for this procedure.  Your physician has requested that you have a carotid duplex. This test is an ultrasound of the carotid arteries in your neck. It looks at blood flow through these arteries that supply the brain with blood. Allow one hour for this exam. There are no restrictions or special instructions.     Follow-Up: Your physician recommends that you schedule a follow-up appointment in: 3 months with Dr End.          If you need a refill on your cardiac medications before your next appointment, please call your pharmacy.

## 2017-11-02 ENCOUNTER — Encounter: Payer: Self-pay | Admitting: Internal Medicine

## 2017-11-02 DIAGNOSIS — R42 Dizziness and giddiness: Secondary | ICD-10-CM | POA: Insufficient documentation

## 2017-11-02 DIAGNOSIS — I351 Nonrheumatic aortic (valve) insufficiency: Secondary | ICD-10-CM | POA: Insufficient documentation

## 2017-11-04 ENCOUNTER — Ambulatory Visit (HOSPITAL_COMMUNITY)
Admission: RE | Admit: 2017-11-04 | Payer: Medicare Other | Source: Ambulatory Visit | Attending: Internal Medicine | Admitting: Internal Medicine

## 2017-11-04 ENCOUNTER — Other Ambulatory Visit: Payer: Self-pay | Admitting: Internal Medicine

## 2017-11-04 DIAGNOSIS — R42 Dizziness and giddiness: Secondary | ICD-10-CM

## 2017-11-07 ENCOUNTER — Ambulatory Visit (HOSPITAL_COMMUNITY)
Admission: RE | Admit: 2017-11-07 | Discharge: 2017-11-07 | Disposition: A | Discharge status: Home / Self Care | Payer: Medicare Other | Source: Ambulatory Visit | Attending: Cardiology | Admitting: Cardiology

## 2017-11-07 DIAGNOSIS — R42 Dizziness and giddiness: Secondary | ICD-10-CM

## 2017-11-07 DIAGNOSIS — I6523 Occlusion and stenosis of bilateral carotid arteries: Secondary | ICD-10-CM | POA: Diagnosis not present

## 2017-11-08 ENCOUNTER — Other Ambulatory Visit: Payer: Self-pay | Admitting: *Deleted

## 2017-11-08 DIAGNOSIS — Z79899 Other long term (current) drug therapy: Secondary | ICD-10-CM | POA: Diagnosis not present

## 2017-11-09 LAB — CBC WITH DIFFERENTIAL/PLATELET
BASOS PCT: 1.1 %
Basophils Absolute: 40 cells/uL (ref 0–200)
EOS PCT: 3.6 %
Eosinophils Absolute: 130 cells/uL (ref 15–500)
HEMATOCRIT: 37.2 % (ref 35.0–45.0)
Hemoglobin: 12.7 g/dL (ref 11.7–15.5)
LYMPHS ABS: 1091 {cells}/uL (ref 850–3900)
MCH: 29.3 pg (ref 27.0–33.0)
MCHC: 34.1 g/dL (ref 32.0–36.0)
MCV: 85.9 fL (ref 80.0–100.0)
MPV: 10.1 fL (ref 7.5–12.5)
Monocytes Relative: 6.9 %
NEUTROS PCT: 58.1 %
Neutro Abs: 2092 cells/uL (ref 1500–7800)
PLATELETS: 231 10*3/uL (ref 140–400)
RBC: 4.33 10*6/uL (ref 3.80–5.10)
RDW: 15.7 % — ABNORMAL HIGH (ref 11.0–15.0)
TOTAL LYMPHOCYTE: 30.3 %
WBC mixed population: 248 cells/uL (ref 200–950)
WBC: 3.6 10*3/uL — AB (ref 3.8–10.8)

## 2017-11-09 LAB — COMPLETE METABOLIC PANEL WITH GFR
AG RATIO: 1.8 (calc) (ref 1.0–2.5)
ALBUMIN MSPROF: 4 g/dL (ref 3.6–5.1)
ALKALINE PHOSPHATASE (APISO): 93 U/L (ref 33–130)
ALT: 16 U/L (ref 6–29)
AST: 19 U/L (ref 10–35)
BUN: 15 mg/dL (ref 7–25)
CALCIUM: 9.3 mg/dL (ref 8.6–10.4)
CO2: 28 mmol/L (ref 20–32)
CREATININE: 0.91 mg/dL (ref 0.60–0.93)
Chloride: 103 mmol/L (ref 98–110)
GFR, EST NON AFRICAN AMERICAN: 63 mL/min/{1.73_m2} (ref >=60)
GFR, Est African American: 73 mL/min/{1.73_m2} (ref >=60)
GLOBULIN: 2.2 g/dL (ref 1.9–3.7)
Glucose, Bld: 96 mg/dL (ref 65–99)
POTASSIUM: 3.9 mmol/L (ref 3.5–5.3)
SODIUM: 139 mmol/L (ref 135–146)
Total Bilirubin: 0.5 mg/dL (ref 0.2–1.2)
Total Protein: 6.2 g/dL (ref 6.1–8.1)

## 2017-11-11 ENCOUNTER — Other Ambulatory Visit (HOSPITAL_COMMUNITY): Payer: Self-pay

## 2017-11-11 NOTE — Progress Notes (Signed)
Decrease in WBC count noted. Patient is on methotrexate 0.8 ML and Arava 10 mg by mouth daily. She had active synovitis in August. I would continue on current treatment for right now and we'll monitor her labs.

## 2017-11-12 ENCOUNTER — Ambulatory Visit (INDEPENDENT_AMBULATORY_CARE_PROVIDER_SITE_OTHER): Payer: Medicare Other

## 2017-11-12 ENCOUNTER — Encounter: Payer: Self-pay | Admitting: Rheumatology

## 2017-11-12 ENCOUNTER — Ambulatory Visit (INDEPENDENT_AMBULATORY_CARE_PROVIDER_SITE_OTHER): Payer: Medicare Other | Admitting: Rheumatology

## 2017-11-12 VITALS — BP 137/79 | HR 71 | Resp 16 | Ht 59.75 in | Wt 146.0 lb

## 2017-11-12 DIAGNOSIS — M19042 Primary osteoarthritis, left hand: Secondary | ICD-10-CM

## 2017-11-12 DIAGNOSIS — Z9049 Acquired absence of other specified parts of digestive tract: Secondary | ICD-10-CM | POA: Diagnosis not present

## 2017-11-12 DIAGNOSIS — Z8659 Personal history of other mental and behavioral disorders: Secondary | ICD-10-CM | POA: Diagnosis not present

## 2017-11-12 DIAGNOSIS — Z85820 Personal history of malignant melanoma of skin: Secondary | ICD-10-CM | POA: Diagnosis not present

## 2017-11-12 DIAGNOSIS — M19041 Primary osteoarthritis, right hand: Secondary | ICD-10-CM | POA: Diagnosis not present

## 2017-11-12 DIAGNOSIS — Z8709 Personal history of other diseases of the respiratory system: Secondary | ICD-10-CM

## 2017-11-12 DIAGNOSIS — M542 Cervicalgia: Secondary | ICD-10-CM | POA: Diagnosis not present

## 2017-11-12 DIAGNOSIS — Z8669 Personal history of other diseases of the nervous system and sense organs: Secondary | ICD-10-CM

## 2017-11-12 DIAGNOSIS — Z79899 Other long term (current) drug therapy: Secondary | ICD-10-CM | POA: Diagnosis not present

## 2017-11-12 DIAGNOSIS — M170 Bilateral primary osteoarthritis of knee: Secondary | ICD-10-CM

## 2017-11-12 DIAGNOSIS — Z8639 Personal history of other endocrine, nutritional and metabolic disease: Secondary | ICD-10-CM

## 2017-11-12 DIAGNOSIS — Z862 Personal history of diseases of the blood and blood-forming organs and certain disorders involving the immune mechanism: Secondary | ICD-10-CM

## 2017-11-12 DIAGNOSIS — Z8679 Personal history of other diseases of the circulatory system: Secondary | ICD-10-CM | POA: Diagnosis not present

## 2017-11-12 DIAGNOSIS — M0579 Rheumatoid arthritis with rheumatoid factor of multiple sites without organ or systems involvement: Secondary | ICD-10-CM

## 2017-11-12 DIAGNOSIS — Z85828 Personal history of other malignant neoplasm of skin: Secondary | ICD-10-CM

## 2017-11-12 DIAGNOSIS — I359 Nonrheumatic aortic valve disorder, unspecified: Secondary | ICD-10-CM

## 2017-11-12 NOTE — Patient Instructions (Signed)
Standing Labs We placed an order today for your standing lab work.    Please come back and get your standing labs in April and every 3 months  We have open lab Monday through Friday from 8:30-11:30 AM and 1:30-4 PM at the office of Dr. Donnovan Stamour.   The office is located at [address], [address], [place], [place] [address] No appointment is necessary.   Labs are drawn by [facility].  You may receive a bill from [facility] for your lab work. If you have any questions regarding directions or hours of operation,  please call [phone].    

## 2017-11-13 ENCOUNTER — Ambulatory Visit (HOSPITAL_COMMUNITY): Payer: Medicare Other | Attending: Internal Medicine

## 2017-11-13 ENCOUNTER — Other Ambulatory Visit: Payer: Self-pay

## 2017-11-13 DIAGNOSIS — R42 Dizziness and giddiness: Secondary | ICD-10-CM | POA: Diagnosis not present

## 2017-11-13 DIAGNOSIS — I351 Nonrheumatic aortic (valve) insufficiency: Secondary | ICD-10-CM | POA: Diagnosis not present

## 2017-11-13 DIAGNOSIS — I080 Rheumatic disorders of both mitral and aortic valves: Secondary | ICD-10-CM | POA: Diagnosis not present

## 2017-11-15 ENCOUNTER — Telehealth: Payer: Self-pay

## 2017-11-15 NOTE — Telephone Encounter (Signed)
-----   Message from Nelva Bush, MD sent at 11/15/2017  7:22 AM EST ----- Please let Katherine Wang know that her aortic regurgitation is stable from prior echos. Her ascending aorta looks a little larger than in the past. I recommend that we perform a CTA of the chest at Katherine Wang' convenience for evaluation of thoracic aortic aneurysm.

## 2017-11-15 NOTE — Progress Notes (Unsigned)
Spoke with patient in regards to setting up a chest CTA.  We will discuss on Monday (1/21), which will give her some time to look into her schedule.

## 2017-11-15 NOTE — Telephone Encounter (Signed)
Spoke with patient in regards to setting her up for a chest CTA. The patient would like [place] to give her a call on Monday (1/21) to further discuss an appointment date which fits into her schedule.

## 2017-11-18 ENCOUNTER — Other Ambulatory Visit: Payer: Self-pay

## 2017-11-18 DIAGNOSIS — I351 Nonrheumatic aortic (valve) insufficiency: Secondary | ICD-10-CM

## 2017-11-18 DIAGNOSIS — I712 Thoracic aortic aneurysm, without rupture, unspecified: Secondary | ICD-10-CM

## 2017-11-21 ENCOUNTER — Telehealth: Payer: Self-pay | Admitting: Family Medicine

## 2017-11-21 NOTE — Telephone Encounter (Signed)
Copied from [place]. Topic: Quick Communication - See Telephone Encounter >> Nov 21, 2017  5:26 PM Boyd Kerbs wrote: CRM for notification. See Telephone encounter for:   Patient is calling for prescription for clonazePAM (KLONOPIN) 0.5 MG tablet.  [facility] pharmacy said they have not heard anything back.  [facility] [place] AT [facility] [address] [place] [address] Phone: [phone] Fax: [phone]    11/21/17.

## 2017-11-21 NOTE — Telephone Encounter (Signed)
Klonopin refill.  Last refill 08/29/17 LOV 02/18/17 Next OV: none

## 2017-11-22 MED ORDER — CLONAZEPAM 0.5 MG PO TABS: 0.5000 mg | ORAL_TABLET | Freq: Two times a day (BID) | ORAL | 2 refills | Status: DC | PRN

## 2017-11-22 NOTE — Telephone Encounter (Signed)
Pt requesting refill on clonazepam.

## 2017-11-27 ENCOUNTER — Inpatient Hospital Stay: Admission: RE | Admit: 2017-11-27 | Payer: Self-pay | Source: Ambulatory Visit

## 2017-12-05 ENCOUNTER — Ambulatory Visit (INDEPENDENT_AMBULATORY_CARE_PROVIDER_SITE_OTHER)
Admission: RE | Admit: 2017-12-05 | Discharge: 2017-12-05 | Disposition: A | Discharge status: Home / Self Care | Payer: Medicare Other | Source: Ambulatory Visit | Attending: Internal Medicine | Admitting: Internal Medicine

## 2017-12-05 DIAGNOSIS — I351 Nonrheumatic aortic (valve) insufficiency: Secondary | ICD-10-CM

## 2017-12-05 DIAGNOSIS — I712 Thoracic aortic aneurysm, without rupture, unspecified: Secondary | ICD-10-CM

## 2017-12-05 MED ORDER — IOPAMIDOL (ISOVUE-370) INJECTION 76%
100.0000 mL | Freq: Once | INTRAVENOUS | Status: AC | PRN
  Administered 2017-12-05: 100 mL via INTRAVENOUS

## 2017-12-10 ENCOUNTER — Telehealth: Payer: Self-pay | Admitting: Internal Medicine

## 2017-12-10 NOTE — Telephone Encounter (Signed)
Pt notified of Dr. Darnelle Bos notes.

## 2017-12-10 NOTE — Telephone Encounter (Signed)
New message    Patient calling for CT results. Please call

## 2017-12-10 NOTE — Telephone Encounter (Signed)
Please see my result noted dated 12/06/17 regarding her mildly enlarged aorta (aka aneurysm): "Please let Ms. Mocarski know that her mildly enlarge aorta is unchanged in size. She should continue her current medications and follow-up as planned in April. Thanks." If she has additional questions or concerns, please let me know.  Nelva Bush, MD [facility] Pager: [phone]

## 2017-12-10 NOTE — Telephone Encounter (Signed)
Spoke with pt who states that she was under the understanding that her CTA was because of a thoracic aortic aneurysm. Pt states that she does not see the aneurysm mention in the results released to her MyChart. Please advise.

## 2017-12-11 NOTE — Progress Notes (Signed)
[place] at [facility] [address] [place], [place] [address] [phone]  Date:  12/12/2017   Name:  Katherine Wang   DOB:  01-Jul-1946   MRN:  [id number]  PCP:  [name], MD    Chief Complaint: No chief complaint on file.   History of Present Illness:  Katherine Wang is a 72 y.o. very pleasant female patient who presents with the following:  History of HTN, RA Here today for a sick visit   She has noted a ST, and seems to be having some blood clots from her nose- when she blows her nose she may see a little blood, but her main concern is feeling clots go down the back of her throat.  This has been happening for about 3 days.  She is otherwise not having nosebleeds. She has been able to cough out a clot and look at it  A few weeks ago she had a low grade fever but this resolved, and she is now feeling overall ok No fever the last week or so  She notes a dry cough No earache  She notes a bit of pressure in her frontal sinuses, but nothing severe.  No GI symptoms.   She would like to get her flu shot and her pneumonia shot today.  She is on methotrexate and understands that this may decrease the efficacy of her immunizations.  However, she is on methotrexate for the long term so there is no need to delay  She had to stop her crestor- both this and lipitor gave her body aches  She had an echo per Dr. Saunders Revel and then a CTA- it looks like she has stable ectasia of her ascending T aorta.  She was not really clear on what- if any- followup might be needed for this finding   CT: IMPRESSION: 1. Unchanged ectasia of the ascending thoracic aorta, measuring up to 3.9 cm. 2.  Aortic atherosclerosis (ICD10-I70.0).  Echo: Study Conclusions  - Left ventricle: The cavity size was normal. Wall thickness was   increased in a pattern of mild LVH. Systolic function was normal.   The estimated ejection fraction was in the range of  60% to 65%.   Wall motion was normal; there were no regional wall motion   abnormalities. Doppler parameters are consistent with abnormal   left ventricular relaxation (grade 1 diastolic dysfunction). The   E/e&' ratio is between 8-15, suggesting indeterminate LV filling   pressure. - Aortic valve: Trileaflet. Sclerosis without stenosis. There was   mild to moderate regurgitation. Mean gradient (S): 10 mm Hg. Peak   gradient (S): 19 mm Hg. - Aorta: Ascending aortic diameter: 41 mm (S). - Ascending aorta: The ascending aorta was mildly dilated. - Mitral valve: Mildly thickened leaflets . There was mild   regurgitation. - Left atrium: The atrium was normal in size. - Inferior vena cava: The vessel was normal in size. The   respirophasic diameter changes were in the normal range (>= 50%),   consistent with normal central venous pressure.  BP Readings from Last 3 Encounters:  12/12/17 140/80  11/12/17 137/79  11/01/17 136/80     Patient Active Problem List   Diagnosis Date Noted  . Moderate aortic regurgitation 11/02/2017  . Lightheadedness 11/02/2017  . Tachycardia 07/26/2017  . Atypical chest pain 07/26/2017  . High risk medications (not anticoagulants) long-term use 01/16/2017  . Baker's cyst of knee, left  12/19/2016  . Pain in both knees 12/19/2016  . Pain of both elbows 12/19/2016  . Malignant melanoma ([place]) 12/19/2016  . Basal cell carcinoma 12/19/2016  . Chronic pansinusitis 08/15/2016  . Deviated septum 08/15/2016  . Epistaxis 08/15/2016  . Hypertrophy, nasal, turbinate 08/15/2016  . Upper airway cough syndrome 04/25/2016  . Hx of adenomatous colonic polyps 06/21/2015  . Hyperlipidemia 11/15/2014  . Rheumatoid arthritis ([place]) 09/01/2014  . Pericarditis 04/24/2011  . Aortic valve disorder 02/14/2009  . ELECTROCARDIOGRAM, ABNORMAL 02/14/2009  . PERNICIOUS ANEMIA 07/10/2007  . DEPRESSION 07/03/2007  . MALAISE AND FATIGUE 07/03/2007  . HYPOTHYROIDISM 04/30/2007  .  Essential hypertension 04/30/2007  . Aneurysm of thoracic aorta ([place]) 02/13/2006    Past Medical History:  Diagnosis Date  . Adenomatous colon polyp 2012  . Allergy   . Anxiety   . Aortic insufficiency   . Arthritis   . Basal cell carcinoma   . Cataract   . Depression   . Diverticulosis   . Fibromyalgia   . Fibromyalgia   . Hyperlipidemia   . Hypertension   . Macular degeneration   . Melanoma ([place])   . Murmur, heart   . Reflux   . Stroke ([place]) 20 years ago   per pt. mild stroke  . Thyroid disease    hypothroidism     Past Surgical History:  Procedure Laterality Date  . ABDOMINAL HYSTERECTOMY    . APPENDECTOMY    . BASAL CELL CARCINOMA EXCISION    . CARDIAC CATHETERIZATION    . CHOLECYSTECTOMY    . COLONOSCOPY     30 + years ago, unsure of where  . EYE SURGERY    . LASIK Bilateral   . MELANOMA EXCISION    . NASAL SEPTUM SURGERY    . TUBAL LIGATION      Social History   Tobacco Use  . Smoking status: Former Smoker    Packs/day: 2.00    Years: 20.00    Pack years: 40.00    Types: Cigarettes    Last attempt to quit: 10/30/1983    Years since quitting: 34.1  . Smokeless tobacco: Never Used  Substance Use Topics  . Alcohol use: No    Alcohol/week: 0.0 oz  . Drug use: No    Family History  Problem Relation Age of Onset  . Heart disease Mother   . Hypertension Mother   . Mental illness Mother   . Allergies Mother   . Heart disease Father   . Hypertension Father   . Mental illness Father   . Emphysema Father        smoked  . Allergies Father   . Heart attack Father   . Hyperlipidemia Sister   . Hypertension Sister   . Allergies Sister   . Hypertension Sister   . Diabetes Sister   . Allergies Sister   . Colon cancer Neg Hx   . Esophageal cancer Neg Hx   . Rectal cancer Neg Hx   . Stomach cancer Neg Hx   . Pancreatic cancer Neg Hx   . Prostate cancer Neg Hx     Allergies  Allergen Reactions  . Tramadol Other (See Comments)    dizzy     Medication list has been reviewed and updated.  Current Outpatient Medications on File Prior to Visit  Medication Sig Dispense Refill  . albuterol (PROVENTIL HFA;VENTOLIN HFA) 108 (90 Base) MCG/ACT inhaler Inhale 2 puffs into the lungs every 6 (six) hours as needed for wheezing  or shortness of breath. 18 g 2  . amLODipine (NORVASC) 5 MG tablet Take 10 mg by mouth daily.     Marland Kitchen aspirin EC 81 MG tablet Take 1 tablet (81 mg total) by mouth daily. 90 tablet 3  . BYSTOLIC 5 MG tablet Take 2.5 mg by mouth daily.    . clonazePAM (KLONOPIN) 0.5 MG tablet Take 1 tablet (0.5 mg total) by mouth 2 (two) times daily as needed for anxiety. 30 tablet 2  . Cyanocobalamin (VITAMIN B 12 PO) Take 1 tablet by mouth daily.    . cycloSPORINE (RESTASIS) 0.05 % ophthalmic emulsion 1 drop 2 (two) times daily.    . famotidine (PEPCID) 20 MG tablet Take 1 tablet (20 mg total) at bedtime by mouth. 30 tablet 5  . fluticasone (FLONASE) 50 MCG/ACT nasal spray Place 2 sprays into both nostrils daily. 16 g 5  . folic acid (FOLVITE) 1 MG tablet TAKE 2 TABLETS BY MOUTH EVERY DAY 180 tablet 3  . ibuprofen (ADVIL,MOTRIN) 600 MG tablet One tablet three times daily x 3 weeks then stop (Patient taking differently: as needed. One tablet three times daily x 3 weeks then stop) 63 tablet 0  . irbesartan (AVAPRO) 300 MG tablet Take 1 tablet (300 mg total) by mouth daily. 30 tablet 11  . leflunomide (ARAVA) 10 MG tablet TAKE 1 TABLET(10 MG) BY MOUTH DAILY 90 tablet 0  . methotrexate 50 MG/2ML injection ADMINISTER 0.8 ML UNDER THE SKIN 1 TIME EVERY WEEK 4 mL 0  . Multiple Vitamins-Minerals (ICAPS AREDS 2) CAPS Take by mouth daily.    Marland Kitchen Respiratory Therapy Supplies (FLUTTER) DEVI Use as directed 1 each 0  . SAFETY-LOK TB SYRINGE 27GX.5" 27G X 1/2" 1 ML MISC USE AS DIRECTED 12 each 0  . sertraline (ZOLOFT) 100 MG tablet Take 2 tablets (200 mg total) by mouth daily. 180 tablet 3  . SYNTHROID 100 MCG tablet TAKE 1 TABLET BY MOUTH DAILY  BEFORE BREAKFAST 90 tablet 3   No current facility-administered medications on file prior to visit.     Review of Systems:  As per HPI- otherwise negative. No blood in stool or any other abnormal bleeding She does notice easy bruising attributed to her aspirin, but no change from her baseline    Physical Examination: Vitals:   12/12/17 1059  BP: 140/80  Pulse: 68  Resp: 16  Temp: 97.6 F (36.4 C)  SpO2: 98%   Vitals:   12/12/17 1059  Weight: 145 lb (65.8 kg)  Height: 4\' 11"  (1.499 m)   Body mass index is 29.29 kg/m. Ideal Body Weight: Weight in (lb) to have BMI = 25: 123.5  GEN: WDWN, NAD, Non-toxic, A & O x 3, looks well HEENT: Atraumatic, Normocephalic. Neck supple. No masses, No LAD.  Bilateral TM wnl, oropharynx normal.  PEERL,EOMI.   Nasal cavity displays small irritated areas in the bilateral anterior nares which would be likely to bleed- however no active bleeding noted  Ears and Nose: No external deformity. CV: RRR, No M/G/R. No JVD. No thrill. No extra heart sounds. PULM: CTA B, no wheezes, crackles, rhonchi. No retractions. No resp. distress. No accessory muscle use. ABD: S, NT, ND, +BS. No rebound. No HSM. EXTR: No c/c/e NEURO Normal gait.  PSYCH: Normally interactive. Conversant. Not depressed or anxious appearing.  Calm demeanor.    Assessment and Plan: Bleeding nose  Immunization due - Plan: Pneumococcal polysaccharide vaccine 23-valent greater than or equal to 2yo subcutaneous/IM  Needs flu shot -  Plan: Flu vaccine HIGH DOSE PF (Fluzone High dose)  Need for 23-polyvalent pneumococcal polysaccharide vaccine  Dilation of thoracic aorta (HCC)  Flu and 2nd pneumonia vaccines today  She has seen Dr. Janace Hoard about a year ago- she will contact him for a follow-up visit asap, will let me know if any change in the meantime She is a bit confused about what to do with her recent echo and CT findings.  Will work on contacting cardiology for to advise her  further   Signed Lamar Blinks, MD

## 2017-12-12 ENCOUNTER — Encounter: Payer: Self-pay | Admitting: Family Medicine

## 2017-12-12 ENCOUNTER — Ambulatory Visit (INDEPENDENT_AMBULATORY_CARE_PROVIDER_SITE_OTHER): Payer: Medicare Other | Admitting: Family Medicine

## 2017-12-12 VITALS — BP 140/80 | HR 68 | Temp 97.6°F | Resp 16 | Ht 59.0 in | Wt 145.0 lb

## 2017-12-12 DIAGNOSIS — R040 Epistaxis: Secondary | ICD-10-CM | POA: Diagnosis not present

## 2017-12-12 DIAGNOSIS — I77810 Thoracic aortic ectasia: Secondary | ICD-10-CM

## 2017-12-12 DIAGNOSIS — Z23 Encounter for immunization: Secondary | ICD-10-CM | POA: Diagnosis not present

## 2017-12-12 NOTE — Patient Instructions (Signed)
Please call Dr. Janace Hoard and schedule a follow-up visit  [address], [place] [address]  [phone]   I will ask Dr. Aundra Dubin for a recommendation about your heart doctor.  You got your flu and 2nd pneumonia shots today

## 2017-12-17 ENCOUNTER — Other Ambulatory Visit: Payer: Self-pay | Admitting: Rheumatology

## 2017-12-18 NOTE — Telephone Encounter (Signed)
Last Visit: 11/12/17 Next visit: 03/18/18 Labs: 11/08/17 Decrease in WBC count noted. continue on current treatment for right now and we'll monitor her labs  Okay to refill per Dr. Estanislado Pandy

## 2018-01-20 DIAGNOSIS — H40003 Preglaucoma, unspecified, bilateral: Secondary | ICD-10-CM | POA: Diagnosis not present

## 2018-01-20 DIAGNOSIS — H353132 Nonexudative age-related macular degeneration, bilateral, intermediate dry stage: Secondary | ICD-10-CM | POA: Diagnosis not present

## 2018-01-23 DIAGNOSIS — J324 Chronic pansinusitis: Secondary | ICD-10-CM | POA: Diagnosis not present

## 2018-01-31 ENCOUNTER — Ambulatory Visit: Payer: Self-pay | Admitting: Internal Medicine

## 2018-02-01 ENCOUNTER — Other Ambulatory Visit: Payer: Self-pay | Admitting: Rheumatology

## 2018-02-04 NOTE — Telephone Encounter (Signed)
Last Visit: 11/12/17 Next visit: 03/18/18 Labs: 11/08/17 Decrease in WBC count noted. continue on current treatment for right now and we'll monitor her labs  Okay to refill per Dr. Estanislado Pandy

## 2018-02-11 ENCOUNTER — Encounter (HOSPITAL_COMMUNITY): Payer: Self-pay | Admitting: Cardiology

## 2018-02-11 ENCOUNTER — Other Ambulatory Visit: Payer: Self-pay | Admitting: *Deleted

## 2018-02-11 ENCOUNTER — Other Ambulatory Visit: Payer: Self-pay

## 2018-02-11 ENCOUNTER — Ambulatory Visit (HOSPITAL_COMMUNITY)
Admission: RE | Admit: 2018-02-11 | Discharge: 2018-02-11 | Disposition: A | Discharge status: Home / Self Care | Payer: Medicare Other | Source: Ambulatory Visit | Attending: Cardiology | Admitting: Cardiology

## 2018-02-11 ENCOUNTER — Other Ambulatory Visit: Payer: Self-pay | Admitting: Family Medicine

## 2018-02-11 VITALS — BP 134/68 | HR 63 | Wt 145.8 lb

## 2018-02-11 DIAGNOSIS — Z87891 Personal history of nicotine dependence: Secondary | ICD-10-CM | POA: Diagnosis not present

## 2018-02-11 DIAGNOSIS — I358 Other nonrheumatic aortic valve disorders: Secondary | ICD-10-CM | POA: Insufficient documentation

## 2018-02-11 DIAGNOSIS — E785 Hyperlipidemia, unspecified: Secondary | ICD-10-CM | POA: Insufficient documentation

## 2018-02-11 DIAGNOSIS — I351 Nonrheumatic aortic (valve) insufficiency: Secondary | ICD-10-CM | POA: Diagnosis not present

## 2018-02-11 DIAGNOSIS — M069 Rheumatoid arthritis, unspecified: Secondary | ICD-10-CM | POA: Insufficient documentation

## 2018-02-11 DIAGNOSIS — I10 Essential (primary) hypertension: Secondary | ICD-10-CM | POA: Diagnosis not present

## 2018-02-11 DIAGNOSIS — Z79899 Other long term (current) drug therapy: Secondary | ICD-10-CM

## 2018-02-11 DIAGNOSIS — Z7982 Long term (current) use of aspirin: Secondary | ICD-10-CM | POA: Diagnosis not present

## 2018-02-11 DIAGNOSIS — R0789 Other chest pain: Secondary | ICD-10-CM | POA: Insufficient documentation

## 2018-02-11 LAB — COMPLETE METABOLIC PANEL WITH GFR
AG Ratio: 1.9 (calc) (ref 1.0–2.5)
ALBUMIN MSPROF: 4.1 g/dL (ref 3.6–5.1)
ALKALINE PHOSPHATASE (APISO): 81 U/L (ref 33–130)
ALT: 43 U/L — ABNORMAL HIGH (ref 6–29)
AST: 46 U/L — AB (ref 10–35)
BUN: 16 mg/dL (ref 7–25)
CO2: 30 mmol/L (ref 20–32)
CREATININE: 0.8 mg/dL (ref 0.60–0.93)
Calcium: 9.3 mg/dL (ref 8.6–10.4)
Chloride: 102 mmol/L (ref 98–110)
GFR, Est African American: 85 mL/min/{1.73_m2} (ref >=60)
GFR, Est Non African American: 73 mL/min/{1.73_m2} (ref >=60)
GLUCOSE: 121 mg/dL — AB (ref 65–99)
Globulin: 2.2 g/dL (calc) (ref 1.9–3.7)
Potassium: 4.2 mmol/L (ref 3.5–5.3)
Sodium: 138 mmol/L (ref 135–146)
Total Bilirubin: 0.8 mg/dL (ref 0.2–1.2)
Total Protein: 6.3 g/dL (ref 6.1–8.1)

## 2018-02-11 LAB — CBC WITH DIFFERENTIAL/PLATELET
Basophils Absolute: 28 cells/uL (ref 0–200)
Basophils Relative: 0.8 %
EOS ABS: 81 {cells}/uL (ref 15–500)
Eosinophils Relative: 2.3 %
HEMATOCRIT: 38.6 % (ref 35.0–45.0)
HEMOGLOBIN: 13.1 g/dL (ref 11.7–15.5)
LYMPHS ABS: 1029 {cells}/uL (ref 850–3900)
MCH: 30.8 pg (ref 27.0–33.0)
MCHC: 33.9 g/dL (ref 32.0–36.0)
MCV: 90.8 fL (ref 80.0–100.0)
MPV: 10.8 fL (ref 7.5–12.5)
Monocytes Relative: 2.5 %
Neutro Abs: 2275 cells/uL (ref 1500–7800)
Neutrophils Relative %: 65 %
Platelets: 226 10*3/uL (ref 140–400)
RBC: 4.25 10*6/uL (ref 3.80–5.10)
RDW: 14.9 % (ref 11.0–15.0)
Total Lymphocyte: 29.4 %
WBC: 3.5 10*3/uL — AB (ref 3.8–10.8)
WBCMIX: 88 {cells}/uL — AB (ref 200–950)

## 2018-02-11 MED ORDER — HYDROCHLOROTHIAZIDE 25 MG PO TABS: 25.0000 mg | ORAL_TABLET | Freq: Every day | ORAL | 3 refills | Status: DC

## 2018-02-11 NOTE — Patient Instructions (Addendum)
Start Hydrochlorothiazide 25 mg (1 tab) daily  Your physician has recommended that you have a sleep study. This test records several body functions during sleep, including: brain activity, eye movement, oxygen and carbon dioxide blood levels, heart rate and rhythm, breathing rate and rhythm, the flow of air through your mouth and nose, snoring, body muscle movements, and chest and belly movement.  Your physician recommends that you return for lab work in: 10 days  Your physician recommends that you schedule a follow-up appointment in: 63 month with Doroteo Bradford, Pharm D   Your physician recommends that you schedule a follow-up appointment in: 1 year with Dr. Aundra Dubin (we will call you)

## 2018-02-11 NOTE — Progress Notes (Signed)
Patient ID: Katherine Wang, female   DOB: November 16, 1945, 72 y.o.   MRN: [id number] PCP: Dr. Lamar Blinks Cardiology: Dr. Aundra Dubin  72 y.o. with history of rheumatoid arthritis, aortic insufficiency, mild ascending aortic dilatation, and suspected prior acute pericarditis returns for cardiology followup.  Patient's cardiac history begins in 2004 when she had possible acute pericarditis in [place].  LHC at that time showed no significant disease.  In our office, she has been followed for a malformed (though trileaflet) aortic valve.  She has had aortic insufficiency and a mildly dilated ascending aorta.  She moved to [place] in 2012 to help her daughter who has an autistic child.  She is back in [place] now.  She says she had a stress test at some time in 72 in [place].  She thinks it was normal.  Echo in 7/17 showed EF 65-70% with moderate AI.  CTA chest in 6/17 showed 3.9 cm ascending aorta.   She had what sounds like a pericarditis flare in 10/17 and was treated with colchicine and ibuprofen with improvement.  Cardiolite was ordered but she cancelled it.   Echo in 1/19 showed EF 60-65% with mild-moderate AI, mild AS.  CTA chest 2/19 showed 3.9 cm ascending aorta.    She returns for followup of valvular heart disease and HTN today.  BP looks borderline elevated in the office today but tends to still run high when she checks at home. She was unable to tolerate a higher dose of Bystolic due to dizziness. She is short of breath walking up a hill, ok on flat ground.  Rare atypical chest pain.  She has significant snoring and daytime sleepiness.   Labs (6/12): BNP 86, ESR 83 Labs (11/15): K 3.8, creatinine 0.94, TSH normal Labs (6/17): Na 131, K 3.7, creatinine 0.8, LDL 77, HDL 52 Labs (1/19): K 3.9, creatinine 0.91  PMH: 1. Acute pericarditis in 2004: Hospitalized in [place].  Had LHC at that time that she reports as showing no significant CAD.  Possible recurrent pericarditis 6/12 and 10/17.   2. 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 Patient has a trileaflet but malformed aortic valve.  Last echo (4/10) showed EF 55-60%, mild LV dilation, mild LV hypertrophy, mild aortic insufficiency, mild MR.  Cardiac MRI (4/10) confirmed that the aortic valve was trileaflet but malformed, EF 64%.  - Echo (6/12): EF 50%, moderate (grade II) diastolic dysfunction, mild AI, mild MR, no pericardial effusion.   - Echo (2/16): EF 50-55%, moderate AI, ascending aorta 4.0 cm, mild MR.  - Echo (7/17): EF 65-70%, moderate AI - Echo (1/19): EF 60-65%, mild LVH, mild-moderate AI with mild AS, 4.1 cm ascending aorta, mild MR.  3.  Ascending aorta dilation: MRA chest (4/10) with 4 x 3.6 cm ascending aorta.  CTA chest (7/11) with 3.7 x 3.7 cm ascending aorta and aberrant right subclavian course.  - CTA chest 6/17 with 3.9 cm ascending aorta.  - CTA chest 2/19 with 3.9 cm ascending aorta.  4.  HTN 5.  Rheumatoid arthritis  6.  H/o rheumatic fever in childhood.  7. Hyperlipidemia 8. Aberrant course right subclavian artery 9. 1/19 carotid doppler: mild bilateral stenosis.   SH: Lives alone, single.  Divorced, 2 daughters.  Works a Camera operator.  Quit smoking in the 1980s.    FH: Mother with angina.   Father with MI at 74    ROS: All systems reviewed and negative except as per HPI.   Current Outpatient Medications  Medication Sig Dispense Refill  .  albuterol (PROVENTIL HFA;VENTOLIN HFA) 108 (90 Base) MCG/ACT inhaler Inhale 2 puffs into the lungs every 6 (six) hours as needed for wheezing or shortness of breath. 18 g 2  . amLODipine (NORVASC) 5 MG tablet Take 10 mg by mouth daily.     Marland Kitchen aspirin EC 81 MG tablet Take 1 tablet (81 mg total) by mouth daily. 90 tablet 3  . BYSTOLIC 5 MG tablet Take 2.5 mg by mouth daily.    . clonazePAM (KLONOPIN) 0.5 MG tablet Take 1 tablet (0.5 mg total) by mouth 2 (two) times daily as needed for anxiety. 30 tablet 2  . Cyanocobalamin (VITAMIN B 12 PO) Take 1 tablet  by mouth daily.    . cycloSPORINE (RESTASIS) 0.05 % ophthalmic emulsion 1 drop 2 (two) times daily.    . famotidine (PEPCID) 20 MG tablet Take 1 tablet (20 mg total) at bedtime by mouth. 30 tablet 5  . folic acid (FOLVITE) 1 MG tablet TAKE 2 TABLETS BY MOUTH EVERY DAY 180 tablet 3  . ibuprofen (ADVIL,MOTRIN) 600 MG tablet One tablet three times daily x 3 weeks then stop (Patient taking differently: as needed. One tablet three times daily x 3 weeks then stop) 63 tablet 0  . irbesartan (AVAPRO) 300 MG tablet Take 1 tablet (300 mg total) by mouth daily. 30 tablet 11  . leflunomide (ARAVA) 10 MG tablet TAKE 1 TABLET(10 MG) BY MOUTH DAILY 90 tablet 0  . methotrexate 50 MG/2ML injection ADMINISTER 0.8 ML UNDER THE SKIN 1 TIME EVERY WEEK 10 mL 0  . Respiratory Therapy Supplies (FLUTTER) DEVI Use as directed 1 each 0  . SAFETY-LOK TB SYRINGE 27GX.5" 27G X 1/2" 1 ML MISC USE AS DIRECTED 12 each 3  . sertraline (ZOLOFT) 100 MG tablet Take 2 tablets (200 mg total) by mouth daily. 180 tablet 3  . SYNTHROID 100 MCG tablet TAKE 1 TABLET BY MOUTH DAILY BEFORE BREAKFAST 90 tablet 3  . hydrochlorothiazide (HYDRODIURIL) 25 MG tablet Take 1 tablet (25 mg total) by mouth daily. 30 tablet 3   No current facility-administered medications for this encounter.     BP 134/68   Pulse 63   Wt 145 lb 12 oz (66.1 kg)   SpO2 100%   BMI 29.44 kg/m  General: NAD Neck: No JVD, no thyromegaly or thyroid nodule.  Lungs: Clear to auscultation bilaterally with normal respiratory effort. CV: Nondisplaced PMI.  Heart regular S1/S2, no S3/S4, 1/6 SEM RUSB.  No peripheral edema.  No carotid bruit.  Normal pedal pulses.  Abdomen: Soft, nontender, no hepatosplenomegaly, no distention.  Skin: Intact without lesions or rashes.  Neurologic: Alert and oriented x 3.  Psych: Normal affect. Extremities: No clubbing or cyanosis.  HEENT: Normal.   Assessment/Plan: 1. Aortic valve disorder: Malformed aortic valve with mild to moderate  AI and mild AS on 1/19 echo. Suspect rheumatic heart disease. - Repeat echo at 1 years in 1/21 2. Ascending aorta dilation: Mild on CTA chest in 2/19.  Can repeat in 2 years in 2/21.   3. HTN: BP is still high.   - Add HCTZ 25 mg daily with BMET in 10 days. Followup with Doroteo Bradford in pharmacy clinic for BP medication titration.    - She has some symptoms of OSA, which can potentiate high BP. I will arrange for a sleep study.  4. Hyperlipidemia: Continue atorvastatin, check lipids today.  5. Chest pain: Atypical, minimal.   Followup in 1 year.    Loralie Champagne 02/11/2018

## 2018-02-12 NOTE — Progress Notes (Signed)
Decrease MTX to 0.6 ml sq q wk

## 2018-02-12 NOTE — Progress Notes (Signed)
Office Visit Note  Patient: Katherine Wang             Date of Birth: Jun 15, 1946           MRN: [id number]             PCP: [name], MD Referring: [name], MD Visit Date: 02/13/2018 Occupation: @GUAROCC @    Subjective:  Bilateral knee pain    History of Present Illness: Katherine Wang is a 72 y.o. female with history of seropositive rheumatoid arthritis and osteoarthritis.  She was accompanied by her daughter today.  Patient states that she has occasional discomfort in bilateral hands and both knee joints.  She states she has decreased grip strength as well as discomfort when opening jars. She states she has chronic bilateral baker's cysts that swell intermittently.   She has been injecting MTX 0.8 ml weekly and Arava 10 mg daily.  Due to increased LFTs, she decreased her dose of MTX to 0.6 ml weekly starting yesterday.      Activities of Daily Living:  Patient reports morning stiffness for 0  minutes.   Patient Reports nocturnal pain.  Difficulty dressing/grooming: Denies Difficulty climbing stairs: Denies Difficulty getting out of chair: Denies Difficulty using hands for taps, buttons, cutlery, and/or writing: Denies   Review of Systems  Constitutional: Positive for fatigue.  HENT: Negative for mouth sores, mouth dryness and nose dryness.   Eyes: Negative for pain, visual disturbance and dryness.  Respiratory: Negative for cough, hemoptysis, shortness of breath and difficulty breathing.   Cardiovascular: Negative for chest pain, palpitations, hypertension and swelling in legs/feet.  Gastrointestinal: Negative for blood in stool, constipation and diarrhea.  Endocrine: Negative for increased urination.  Genitourinary: Negative for painful urination.  Musculoskeletal: Positive for arthralgias, joint pain and joint swelling. Negative for myalgias, muscle weakness, morning stiffness, muscle tenderness and myalgias.  Skin: Negative for color change, pallor,  rash, hair loss, nodules/bumps, skin tightness, ulcers and sensitivity to sunlight.  Allergic/Immunologic: Negative for susceptible to infections.  Neurological: Negative for dizziness, numbness, headaches and weakness.  Hematological: Negative for swollen glands.  Psychiatric/Behavioral: Positive for depressed mood. Negative for sleep disturbance. The patient is nervous/anxious.     PMFS History:  Patient Active Problem List   Diagnosis Date Noted  . Moderate aortic regurgitation 11/02/2017  . Lightheadedness 11/02/2017  . Tachycardia 07/26/2017  . Atypical chest pain 07/26/2017  . High risk medications (not anticoagulants) long-term use 01/16/2017  . Baker's cyst of knee, left 12/19/2016  . Pain in both knees 12/19/2016  . Pain of both elbows 12/19/2016  . Malignant melanoma ([place]) 12/19/2016  . Basal cell carcinoma 12/19/2016  . Chronic pansinusitis 08/15/2016  . Deviated septum 08/15/2016  . Epistaxis 08/15/2016  . Hypertrophy, nasal, turbinate 08/15/2016  . Upper airway cough syndrome 04/25/2016  . Hx of adenomatous colonic polyps 06/21/2015  . Hyperlipidemia 11/15/2014  . Rheumatoid arthritis ([place]) 09/01/2014  . Pericarditis 04/24/2011  . Aortic valve disorder 02/14/2009  . ELECTROCARDIOGRAM, ABNORMAL 02/14/2009  . PERNICIOUS ANEMIA 07/10/2007  . DEPRESSION 07/03/2007  . MALAISE AND FATIGUE 07/03/2007  . HYPOTHYROIDISM 04/30/2007  . Essential hypertension 04/30/2007  . Aneurysm of thoracic aorta ([place]) 02/13/2006    Past Medical History:  Diagnosis Date  . Adenomatous colon polyp 2012  . Allergy   . Anxiety   . Aortic insufficiency   . Arthritis   . Basal cell carcinoma   . Cataract   . Depression   . Diverticulosis   .  Fibromyalgia   . Fibromyalgia   . Hyperlipidemia   . Hypertension   . Macular degeneration   . Melanoma ([place])   . Murmur, heart   . Reflux   . Stroke ([place]) 20 years ago   per pt. mild stroke  . Thyroid disease    hypothroidism       Family History  Problem Relation Age of Onset  . Heart disease Mother   . Hypertension Mother   . Mental illness Mother   . Allergies Mother   . Heart disease Father   . Hypertension Father   . Mental illness Father   . Emphysema Father        smoked  . Allergies Father   . Heart attack Father   . Hyperlipidemia Sister   . Hypertension Sister   . Allergies Sister   . Hypertension Sister   . Diabetes Sister   . Allergies Sister   . Epilepsy Son   . Aneurysm Son   . Hyperthyroidism Daughter   . Hypothyroidism Daughter   . Hypertension Daughter   . Colon cancer Neg Hx   . Esophageal cancer Neg Hx   . Rectal cancer Neg Hx   . Stomach cancer Neg Hx   . Pancreatic cancer Neg Hx   . Prostate cancer Neg Hx    Past Surgical History:  Procedure Laterality Date  . ABDOMINAL HYSTERECTOMY    . APPENDECTOMY    . BASAL CELL CARCINOMA EXCISION    . CARDIAC CATHETERIZATION    . CHOLECYSTECTOMY    . COLONOSCOPY     30 + years ago, unsure of where  . EYE SURGERY    . LASIK Bilateral   . MELANOMA EXCISION    . NASAL SEPTUM SURGERY    . TUBAL LIGATION     Social History   Social History Narrative   Lives alone in a one story home.  Has 2 living children.  Retired Engineer, water.       Objective: Vital Signs: BP 127/79 (BP Location: Left Arm, Patient Position: Sitting, Cuff Size: Normal)   Pulse 77   Resp 13   Ht 4\' 11"  (1.499 m)   Wt 147 lb (66.7 kg)   BMI 29.69 kg/m    Physical Exam  Constitutional: She is oriented to person, place, and time. She appears well-developed and well-nourished.  HENT:  Head: Normocephalic and atraumatic.  Eyes: Conjunctivae and EOM are normal.  Neck: Normal range of motion.  Cardiovascular: Normal rate, regular rhythm, normal heart sounds and intact distal pulses.  Pulmonary/Chest: Effort normal and breath sounds normal.  Abdominal: Soft. Bowel sounds are normal.  Lymphadenopathy:    She has no cervical adenopathy.  Neurological: She is  alert and oriented to person, place, and time.  Skin: Skin is warm and dry. Capillary refill takes less than 2 seconds.  Psychiatric: She has a normal mood and affect. Her behavior is normal.  Nursing note and vitals reviewed.    Musculoskeletal Exam: C-spine, thoracic, and lumbar spine good ROM.  Shoulder joints good ROM with discomfort with internal rotation.  Elbow joints, wrist joints, MCPs, PIPs, and DIPs good ROM with no synovitis.  PIP and DIP synovial thickening consistent with osteoarthritis.  Subluxation of 1st DIP joint bilaterally. Hip joints, knee joints, ankle joints, MTPs, PIPs, and DIPs good ROM with no synovitis.  Dorsal spurring bilaterally. Bilateral hallux rigidus.  No warmth or effusion of bilateral knee joints.   CDAI Exam: CDAI Homunculus Exam:  Joint Counts:  CDAI Tender Joint count: 0 CDAI Swollen Joint count: 0  Global Assessments:  Patient Global Assessment: 3 Provider Global Assessment: 3  CDAI Calculated Score: 6    Investigation: No additional findings. CBC Latest Ref Rng & Units 02/11/2018 11/08/2017 05/09/2017  WBC 3.8 - 10.8 Thousand/uL 3.5(L) 3.6(L) 4.8  Hemoglobin 11.7 - 15.5 g/dL 13.1 12.7 14.0  Hematocrit 35.0 - 45.0 % 38.6 37.2 42.5  Platelets 140 - 400 Thousand/uL 226 231 231   CMP Latest Ref Rng & Units 02/11/2018 11/08/2017 05/09/2017  Glucose 65 - 99 mg/dL 121(H) 96 93  BUN 7 - 25 mg/dL 16 15 11   Creatinine 0.60 - 0.93 mg/dL 0.80 0.91 0.78  Sodium 135 - 146 mmol/L 138 139 137  Potassium 3.5 - 5.3 mmol/L 4.2 3.9 4.7  Chloride 98 - 110 mmol/L 102 103 102  CO2 20 - 32 mmol/L 30 28 25   Calcium 8.6 - 10.4 mg/dL 9.3 9.3 9.1  Total Protein 6.1 - 8.1 g/dL 6.3 6.2 6.6  Total Bilirubin 0.2 - 1.2 mg/dL 0.8 0.5 0.6  Alkaline Phos 33 - 130 U/L - - 95  AST 10 - 35 U/L 46(H) 19 26  ALT 6 - 29 U/L 43(H) 16 19    Imaging: No results found.  Speciality Comments: No specialty comments available.    Procedures:  No procedures  performed Allergies: Tramadol and Statins   Assessment / Plan:     Visit Diagnoses: Rheumatoid arthritis involving multiple sites with positive rheumatoid factor (HCC) -  +RF, -CCP, -ANA .  Patient had no synovitis on examination.  She has some synovial thickening over the MCP joints.  She also has DIP PIP changes in her feet due to osteoarthritis.  In my opinion her rheumatoid arthritis is very well controlled.  Recently decrease her methotrexate 2.6 mL subcu due to elevation of LFTs.  She is concerned that she may have a flare on the decreased dose of methotrexate.  I discussed possibly adding Plaquenil 200 mg a day in case she has a flare.  She has history of macular degeneration.  I have advised her to take permission from her ophthalmologist regarding this.  High risk medication use - MTX 0.6 ml sq qwk, Arava 10 mg po qd , folic acid 2mg  po qd.  Her labs have been stable except for mild elevation of her LFTs.  We will continue to monitor her labs.  Primary osteoarthritis of both hands-joint protection and muscle strengthening discussed.  Primary osteoarthritis of both knees-she has no synovitis on examination.  She has a positive Baker's cyst bilaterally.  I offered cortisone injection or aspiration of Baker's cyst which she declined.  History of vitamin D deficiency-she is on vitamin D supplement.  History of melanoma - She was treated with Remicade in the past which worked really well for her. But due to history of melanoma she had to come off on the Biologics.  Other medical conditions are listed as follows:   History of anemia  History of neuropathy  Aortic valve disorder  History of depression  History of basal cell carcinoma  History of anxiety  History of cholecystectomy  History of hypertension  History of hypothyroidism  History of hypercholesterolemia  History of asthma  History of macular degeneration    Orders: No orders of the defined types were placed in  this encounter.  No orders of the defined types were placed in this encounter.   Face-to-face time spent with patient was 30  minutes. >50% of time was spent in counseling and coordination of care.  Follow-Up Instructions: Return in about 3 months (around 05/15/2018) for Rheumatoid arthritis, Osteoarthritis.   Bo Merino, MD  Note - This record has been created using Editor, commissioning.  Chart creation errors have been sought, but may not always  have been located. Such creation errors do not reflect on  the standard of medical care.

## 2018-02-12 NOTE — Telephone Encounter (Signed)
Seen in February of this year NCCSR:  Reviewed 01/16/2018  1  11/22/2017  Clonazepam 0.5 Mg Tablet  30 15 Je Cop  [id number]  [id number]  1 2.00 LME Comm Ins  [place]  12/18/2017  1  11/22/2017  Clonazepam 0.5 Mg Tablet  30 15 Je Cop  [id number]  [facility] ([id number])  0 2.00 LME Comm Ins  [place]  11/22/2017  1  11/22/2017  Clonazepam 0.5 Mg Tablet  30 15 Je Cop  [id number]  [facility] ([id number])  0 2.00 LME Comm Ins  [place]  10/25/2017  1  08/29/2017  Clonazepam 0.5 Mg Tablet  30 15 Je Cop  [id number]  [facility] ([id number])  2      Ok to refill, will do for her today

## 2018-02-12 NOTE — Telephone Encounter (Signed)
Received refill request for clonazePAM (KLONOPIN) 0.5 MG tablet. Last office visit 12/12/17 and last refill 11/22/17.

## 2018-02-13 ENCOUNTER — Ambulatory Visit (INDEPENDENT_AMBULATORY_CARE_PROVIDER_SITE_OTHER): Payer: Medicare Other | Admitting: Rheumatology

## 2018-02-13 ENCOUNTER — Encounter: Payer: Self-pay | Admitting: Rheumatology

## 2018-02-13 ENCOUNTER — Other Ambulatory Visit (HOSPITAL_COMMUNITY): Payer: Self-pay | Admitting: *Deleted

## 2018-02-13 VITALS — BP 127/79 | HR 77 | Resp 13 | Ht 59.0 in | Wt 147.0 lb

## 2018-02-13 DIAGNOSIS — M0579 Rheumatoid arthritis with rheumatoid factor of multiple sites without organ or systems involvement: Secondary | ICD-10-CM

## 2018-02-13 DIAGNOSIS — Z85828 Personal history of other malignant neoplasm of skin: Secondary | ICD-10-CM

## 2018-02-13 DIAGNOSIS — Z8659 Personal history of other mental and behavioral disorders: Secondary | ICD-10-CM | POA: Diagnosis not present

## 2018-02-13 DIAGNOSIS — Z8679 Personal history of other diseases of the circulatory system: Secondary | ICD-10-CM

## 2018-02-13 DIAGNOSIS — Z8639 Personal history of other endocrine, nutritional and metabolic disease: Secondary | ICD-10-CM

## 2018-02-13 DIAGNOSIS — M170 Bilateral primary osteoarthritis of knee: Secondary | ICD-10-CM

## 2018-02-13 DIAGNOSIS — Z8669 Personal history of other diseases of the nervous system and sense organs: Secondary | ICD-10-CM | POA: Diagnosis not present

## 2018-02-13 DIAGNOSIS — Z79899 Other long term (current) drug therapy: Secondary | ICD-10-CM | POA: Diagnosis not present

## 2018-02-13 DIAGNOSIS — M19041 Primary osteoarthritis, right hand: Secondary | ICD-10-CM | POA: Diagnosis not present

## 2018-02-13 DIAGNOSIS — I359 Nonrheumatic aortic valve disorder, unspecified: Secondary | ICD-10-CM | POA: Diagnosis not present

## 2018-02-13 DIAGNOSIS — M19042 Primary osteoarthritis, left hand: Secondary | ICD-10-CM

## 2018-02-13 DIAGNOSIS — Z8709 Personal history of other diseases of the respiratory system: Secondary | ICD-10-CM

## 2018-02-13 DIAGNOSIS — Z9049 Acquired absence of other specified parts of digestive tract: Secondary | ICD-10-CM | POA: Diagnosis not present

## 2018-02-13 DIAGNOSIS — Z862 Personal history of diseases of the blood and blood-forming organs and certain disorders involving the immune mechanism: Secondary | ICD-10-CM | POA: Diagnosis not present

## 2018-02-13 DIAGNOSIS — Z85820 Personal history of malignant melanoma of skin: Secondary | ICD-10-CM | POA: Diagnosis not present

## 2018-02-17 DIAGNOSIS — H40003 Preglaucoma, unspecified, bilateral: Secondary | ICD-10-CM | POA: Diagnosis not present

## 2018-02-19 ENCOUNTER — Telehealth: Payer: Self-pay | Admitting: Rheumatology

## 2018-02-19 MED ORDER — FOLIC ACID 1 MG PO TABS: 2.0000 mg | ORAL_TABLET | Freq: Every day | ORAL | 3 refills | Status: DC

## 2018-02-19 NOTE — Telephone Encounter (Signed)
Patient request a refill on Folic Acid sent to [facility] on [address] in [place]. Patient is completely out.

## 2018-02-19 NOTE — Telephone Encounter (Signed)
Last Visit: 02/13/18 Next Visit: 05/13/18  Okay to refill per Dr. Estanislado Pandy

## 2018-02-21 ENCOUNTER — Ambulatory Visit: Payer: Self-pay | Admitting: *Deleted

## 2018-02-21 ENCOUNTER — Telehealth (HOSPITAL_COMMUNITY): Payer: Self-pay | Admitting: *Deleted

## 2018-02-21 ENCOUNTER — Ambulatory Visit (HOSPITAL_COMMUNITY)
Admission: RE | Admit: 2018-02-21 | Discharge: 2018-02-21 | Disposition: A | Discharge status: Home / Self Care | Payer: Medicare Other | Source: Ambulatory Visit | Attending: Internal Medicine | Admitting: Internal Medicine

## 2018-02-21 DIAGNOSIS — E785 Hyperlipidemia, unspecified: Secondary | ICD-10-CM | POA: Diagnosis not present

## 2018-02-21 DIAGNOSIS — I10 Essential (primary) hypertension: Secondary | ICD-10-CM | POA: Insufficient documentation

## 2018-02-21 LAB — LIPID PANEL
CHOL/HDL RATIO: 5.6 ratio
Cholesterol: 246 mg/dL — ABNORMAL HIGH (ref 0–200)
HDL: 44 mg/dL (ref >40)
LDL Cholesterol: 179 mg/dL — ABNORMAL HIGH (ref 0–99)
Triglycerides: 117 mg/dL (ref <150)
VLDL: 23 mg/dL (ref 0–40)

## 2018-02-21 LAB — BASIC METABOLIC PANEL
Anion gap: 11 (ref 5–15)
BUN: 10 mg/dL (ref 6–20)
CO2: 24 mmol/L (ref 22–32)
CREATININE: 0.82 mg/dL (ref 0.44–1.00)
Calcium: 9.4 mg/dL (ref 8.9–10.3)
Chloride: 101 mmol/L (ref 101–111)
GFR calc Af Amer: >60 mL/min (ref >60)
GLUCOSE: 105 mg/dL — AB (ref 65–99)
Potassium: 3.6 mmol/L (ref 3.5–5.1)
SODIUM: 136 mmol/L (ref 135–145)

## 2018-02-21 NOTE — Telephone Encounter (Signed)
Advanced Heart Failure Triage Encounter  Patient Name: Katherine Wang  Date of Call: 02/21/18  Problem:  Patient asked to speak with a nurse while she was here for her lab appointment.  She presented with slight redness in both her lower extremities with some redness in both hands.  She stated this just started yesterday.  She also had a insect bite on her left arm that happened 3 days ago.  She thought her redness in her feet and hands could be a side effect from the hydrochlorothiazide that she started a little over a week ago.   Plan:  I spoke with Ileene Patrick, PharmD and she stated she doesn't think this is a side effect of hydrochlorothiazide, the symptoms would have appeared soon after starting medication.  I advised patient to call her PCP if it were to get worse but to try benadryl for now and keep a watch on it.  I told her if it were to get worse to go to an urgent care.  She stated she would call her PCP, no further questions.  Darron Doom, RN

## 2018-02-21 NOTE — Telephone Encounter (Signed)
Pt reports stung on left forearm by unknown "flying black insect, maybe a wasp" on Wednesday 02/19/18. Site red,warm , diameter of 1-1 1/2 ins. States tops of feet, ankles up 3-4 inches now red, warm, itchy. Denies pain, swelling.Hands also itching. Denies any SOB. States she started on HCTZ 10 days ago, new medication. During call pt states she needs to "leave for cardiologist appt.now." Instructed pt to alert cardiologist of HCTZ. Call back after appt for further instruction. Answer Assessment - Initial Assessment Questions 1. TYPE of INSECT: "What type of insect was it?"     unsure 2. ONSET: "When did you get bitten?"      Wednesday 02/19/18 3. LOCATION: "Where is the insect bite located?"      Left forearm 4. REDNESS: "Is the area red or pink?" If so, ask "What size is area of redness?" (inches or cm). "When did the redness start?"     Red, warm and hard, 1-1 1/2 in diameter 5. PAIN: "Is there any pain?" If so, ask: "How bad is it?"  (Scale 1-10; or mild, moderate, severe)     no 6. ITCHING: "Does it itch?" If so, ask: "How bad is the itch?"    - MILD: doesn't interfere with normal activities   - MODERATE-SEVERE: interferes with work, school, sleep, or other activities      Itching of hands and feet 7. SWELLING: "How big is the swelling?" (inches, cm, or compare to coins)     no 8. OTHER SYMPTOMS: "Do you have any other symptoms?"  (e.g., difficulty breathing, hives)     feet red, warm, itchy; from ankles up 3-4 inches; hands itchy. Also of note, started HCTZ 10 days ago.  Protocols used: INSECT BITE-A-AH

## 2018-02-24 ENCOUNTER — Ambulatory Visit (INDEPENDENT_AMBULATORY_CARE_PROVIDER_SITE_OTHER): Payer: Medicare Other | Admitting: Medical

## 2018-02-24 ENCOUNTER — Encounter: Payer: Self-pay | Admitting: Medical

## 2018-02-24 VITALS — BP 138/71 | HR 68 | Temp 98.0°F | Resp 16 | Ht 59.0 in | Wt 145.8 lb

## 2018-02-24 DIAGNOSIS — T63464A Toxic effect of venom of wasps, undetermined, initial encounter: Secondary | ICD-10-CM

## 2018-02-24 DIAGNOSIS — L089 Local infection of the skin and subcutaneous tissue, unspecified: Secondary | ICD-10-CM

## 2018-02-24 MED ORDER — MUPIROCIN 2 % EX OINT: TOPICAL_OINTMENT | CUTANEOUS | 0 refills | Status: DC

## 2018-02-24 MED ORDER — CEPHALEXIN 500 MG PO CAPS
500.0000 mg | ORAL_CAPSULE | Freq: Two times a day (BID) | ORAL | 0 refills | Status: DC
Start: 2018-02-24 — End: 2018-03-17

## 2018-02-24 NOTE — Patient Instructions (Signed)
You do appear to have probable secondary bacterial infection after wasp sting.  The infection appears to be present 1 week post initial allergic reaction/sting.  He has some intermittent occasional itching and can use Benadryl as needed.  Did go ahead and prescribe Keflex antibiotic.  Initially considered use of doxycycline and then Augmentin but potential interaction with methotrexate so decide on Keflex.  Keflex typically has adequate antibiotic for skin infections.  If area worsens or changes/expands please let us know. Did also decide to write for mupirocin  topical antibiotic.  Follow-up in 7 to 10 days or as needed.

## 2018-02-24 NOTE — Progress Notes (Signed)
Subjective:    Patient ID: Katherine Wang, female    DOB: 1946-06-03, 72 y.o.   MRN: [id number]  HPI  Pt in for follow up on recent wasp sting.  She got stung on her left forearm. This happened about one week ago.  Pt states the sting area was red, swollen and itched and tender. Now the area is warm to touch and red area is expanding. Initially the redness was size of a quarter.  Pt did use benadryl early on.   Review of Systems  Constitutional: Negative for chills, fatigue and fever.  Respiratory: Negative for cough, chest tightness, shortness of breath and wheezing.   Cardiovascular: Negative for chest pain and palpitations.  Musculoskeletal: Negative for back pain, gait problem and joint swelling.  Skin:       See hpi.  Neurological: Negative for dizziness, seizures, speech difficulty, weakness and headaches.  Hematological: Negative for adenopathy. Does not bruise/bleed easily.  Psychiatric/Behavioral: Negative for behavioral problems and confusion.   Past Medical History:  Diagnosis Date  . Adenomatous colon polyp 2012  . Allergy   . Anxiety   . Aortic insufficiency   . Arthritis   . Basal cell carcinoma   . Cataract   . Depression   . Diverticulosis   . Fibromyalgia   . Fibromyalgia   . Hyperlipidemia   . Hypertension   . Macular degeneration   . Melanoma ([place])   . Murmur, heart   . Reflux   . Stroke ([place]) 20 years ago   per pt. mild stroke  . Thyroid disease    hypothroidism      Social History   Socioeconomic History  . Marital status: Divorced    Spouse name: Not on file  . Number of children: 3  . Years of education: Not on file  . Highest education level: Not on file  Occupational History  . Occupation: retired  Scientific laboratory technician  . Financial resource strain: Not on file  . Food insecurity:    Worry: Not on file    Inability: Not on file  . Transportation needs:    Medical: Not on file    Non-medical: Not on file  Tobacco Use  . Smoking  status: Former Smoker    Packs/day: 2.00    Years: 20.00    Pack years: 40.00    Types: Cigarettes    Last attempt to quit: 10/30/1983    Years since quitting: 34.3  . Smokeless tobacco: Never Used  Substance and Sexual Activity  . Alcohol use: No    Alcohol/week: 0.0 oz  . Drug use: No  . Sexual activity: Not on file  Lifestyle  . Physical activity:    Days per week: Not on file    Minutes per session: Not on file  . Stress: Not on file  Relationships  . Social connections:    Talks on phone: Not on file    Gets together: Not on file    Attends religious service: Not on file    Active member of club or organization: Not on file    Attends meetings of clubs or organizations: Not on file    Relationship status: Not on file  . Intimate partner violence:    Fear of current or ex partner: Not on file    Emotionally abused: Not on file    Physically abused: Not on file    Forced sexual activity: Not on file  Other Topics Concern  . Not on  file  Social History Narrative   Lives alone in a one story home.  Has 2 living children.  Retired Engineer, water.      Past Surgical History:  Procedure Laterality Date  . ABDOMINAL HYSTERECTOMY    . APPENDECTOMY    . BASAL CELL CARCINOMA EXCISION    . CARDIAC CATHETERIZATION    . CHOLECYSTECTOMY    . COLONOSCOPY     30 + years ago, unsure of where  . EYE SURGERY    . LASIK Bilateral   . MELANOMA EXCISION    . NASAL SEPTUM SURGERY    . TUBAL LIGATION      Family History  Problem Relation Age of Onset  . Heart disease Mother   . Hypertension Mother   . Mental illness Mother   . Allergies Mother   . Heart disease Father   . Hypertension Father   . Mental illness Father   . Emphysema Father        smoked  . Allergies Father   . Heart attack Father   . Hyperlipidemia Sister   . Hypertension Sister   . Allergies Sister   . Hypertension Sister   . Diabetes Sister   . Allergies Sister   . Epilepsy Son   . Aneurysm Son   .  Hyperthyroidism Daughter   . Hypothyroidism Daughter   . Hypertension Daughter   . Colon cancer Neg Hx   . Esophageal cancer Neg Hx   . Rectal cancer Neg Hx   . Stomach cancer Neg Hx   . Pancreatic cancer Neg Hx   . Prostate cancer Neg Hx     Allergies  Allergen Reactions  . Tramadol Other (See Comments)    dizzy  . Statins     Body ache    Current Outpatient Medications on File Prior to Visit  Medication Sig Dispense Refill  . albuterol (PROVENTIL HFA;VENTOLIN HFA) 108 (90 Base) MCG/ACT inhaler Inhale 2 puffs into the lungs every 6 (six) hours as needed for wheezing or shortness of breath. 18 g 2  . amLODipine (NORVASC) 5 MG tablet Take 10 mg by mouth daily.     Marland Kitchen aspirin EC 81 MG tablet Take 1 tablet (81 mg total) by mouth daily. 90 tablet 3  . BYSTOLIC 5 MG tablet Take 2.5 mg by mouth daily.    . clonazePAM (KLONOPIN) 0.5 MG tablet TAKE 1 TABLET(0.5 MG) BY MOUTH TWICE DAILY AS NEEDED FOR ANXIETY 30 tablet 2  . Cyanocobalamin (VITAMIN B 12 PO) Take 1 tablet by mouth daily.    . cycloSPORINE (RESTASIS) 0.05 % ophthalmic emulsion 1 drop 2 (two) times daily.    . famotidine (PEPCID) 20 MG tablet Take 1 tablet (20 mg total) at bedtime by mouth. 30 tablet 5  . folic acid (FOLVITE) 1 MG tablet Take 2 tablets (2 mg total) by mouth daily. 180 tablet 3  . hydrochlorothiazide (HYDRODIURIL) 25 MG tablet Take 1 tablet (25 mg total) by mouth daily. 30 tablet 3  . ibuprofen (ADVIL,MOTRIN) 600 MG tablet One tablet three times daily x 3 weeks then stop (Patient taking differently: as needed. One tablet three times daily x 3 weeks then stop) 63 tablet 0  . irbesartan (AVAPRO) 300 MG tablet Take 1 tablet (300 mg total) by mouth daily. 30 tablet 11  . leflunomide (ARAVA) 10 MG tablet TAKE 1 TABLET(10 MG) BY MOUTH DAILY 90 tablet 0  . methotrexate 50 MG/2ML injection ADMINISTER 0.8 ML UNDER THE SKIN 1 TIME EVERY  WEEK (Patient taking differently: ADMINISTER 0.6 ML UNDER THE SKIN 1 TIME EVERY WEEK) 10  mL 0  . SAFETY-LOK TB SYRINGE 27GX.5" 27G X 1/2" 1 ML MISC USE AS DIRECTED 12 each 3  . sertraline (ZOLOFT) 100 MG tablet Take 2 tablets (200 mg total) by mouth daily. 180 tablet 3  . SYNTHROID 100 MCG tablet TAKE 1 TABLET BY MOUTH DAILY BEFORE BREAKFAST 90 tablet 3   No current facility-administered medications on file prior to visit.     BP 138/71   Pulse 68   Temp 98 F (36.7 C) (Oral)   Resp 16   Ht 4\' 11"  (1.499 m)   Wt 145 lb 12.8 oz (66.1 kg)   SpO2 99%   BMI 29.45 kg/m       Objective:   Physical Exam  General- No acute distress. Pleasant patient. Neck- Full range of motion, no jvd Lungs- Clear, even and unlabored. Heart- regular rate and rhythm. Neurologic- CNII- XII grossly intact. Skin- left forearm area of redness about 3 inches x 2.5 in .2 scabs in center.       Assessment & Plan:  You do appear to have probable secondary bacterial infection after wasp sting.  The infection appears to be present 1 week post initial allergic reaction/sting.  He has some intermittent occasional itching and can use Benadryl as needed.  Did go ahead and prescribe Keflex antibiotic.  Initially considered use of doxycycline and then Augmentin but potential interaction with methotrexate so decide on Keflex.  Keflex typically has adequate antibiotic for skin infections.  If area worsens or changes/expands please let us know. Did also decide to write for mupirocin  topical antibiotic.  Follow-up in 7 to 10 days or as needed.  Mackie Pai, PA-C

## 2018-02-25 ENCOUNTER — Telehealth: Payer: Self-pay | Admitting: Family Medicine

## 2018-02-25 ENCOUNTER — Other Ambulatory Visit: Payer: Self-pay | Admitting: Family Medicine

## 2018-02-25 DIAGNOSIS — F32A Depression, unspecified: Secondary | ICD-10-CM

## 2018-02-25 DIAGNOSIS — F329 Major depressive disorder, single episode, unspecified: Secondary | ICD-10-CM

## 2018-02-25 MED ORDER — SERTRALINE HCL 100 MG PO TABS: 200.0000 mg | ORAL_TABLET | Freq: Every day | ORAL | 3 refills | Status: DC

## 2018-02-25 NOTE — Telephone Encounter (Signed)
Copied from [place] [id number]. Topic: Quick Communication - Rx Refill/Question >> Feb 25, 2018 11:40 AM Ahmed Prima L wrote: Medication: sertraline (ZOLOFT) 100 MG tablet Has the patient contacted their pharmacy? Yes, wants to know if this can be re-filled today she is going out of town tomorrow (Agent: If no, request that the patient contact the pharmacy for the refill.) Preferred Pharmacy (with phone number or street name): [facility], [address] Agent: Please be advised that RX refills may take up to 3 business days. We ask that you follow-up with your pharmacy.

## 2018-02-28 ENCOUNTER — Telehealth (HOSPITAL_COMMUNITY): Payer: Self-pay

## 2018-02-28 DIAGNOSIS — E785 Hyperlipidemia, unspecified: Secondary | ICD-10-CM

## 2018-02-28 NOTE — Telephone Encounter (Signed)
Notes recorded by Shirley Muscat, RN on 02/28/2018 at 4:56 PM EDT Pt aware of results and referral placed ------  Notes recorded by Larey Dresser, MD on 02/26/2018 at 4:31 PM EDT See if she would be willing to go to lipid clinic to try for PCSK9-inhibitor. ------  Notes recorded by Larey Dresser, MD on 02/26/2018 at 1:40 PM EDT Doroteo Bradford: She has some carotid disease. Could we get her qualified for PCSK9-inhibitor? ------  Notes recorded by Shirley Muscat, RN on 02/26/2018 at 1:38 PM EDT Pt states she is not taking her Lipitor and can not tolerate Crestor either due to muscle aches. ------  Notes recorded by Larey Dresser, MD on 02/22/2018 at 2:27 PM EDT LDL is extremely high, would she be willing to try Crestor 10 mg daily? Less chance of causing myalgias than atorvastatin.

## 2018-03-01 ENCOUNTER — Other Ambulatory Visit: Payer: Self-pay | Admitting: Family Medicine

## 2018-03-01 DIAGNOSIS — R058 Other specified cough: Secondary | ICD-10-CM

## 2018-03-01 DIAGNOSIS — R05 Cough: Secondary | ICD-10-CM

## 2018-03-03 ENCOUNTER — Other Ambulatory Visit: Payer: Self-pay | Admitting: Family Medicine

## 2018-03-03 DIAGNOSIS — E034 Atrophy of thyroid (acquired): Secondary | ICD-10-CM

## 2018-03-10 ENCOUNTER — Encounter (HOSPITAL_BASED_OUTPATIENT_CLINIC_OR_DEPARTMENT_OTHER): Payer: Self-pay

## 2018-03-14 ENCOUNTER — Other Ambulatory Visit: Payer: Self-pay | Admitting: Rheumatology

## 2018-03-14 NOTE — Telephone Encounter (Signed)
Last visit: 02/13/18 Next visit: 05/13/18 Labs: 02/11/18 Elevated glucose AST 46, ALT 43  Okay to refill per Dr. Estanislado Pandy

## 2018-03-17 ENCOUNTER — Ambulatory Visit (HOSPITAL_COMMUNITY)
Admission: RE | Admit: 2018-03-17 | Discharge: 2018-03-17 | Disposition: A | Discharge status: Home / Self Care | Payer: Medicare Other | Source: Ambulatory Visit | Attending: Internal Medicine | Admitting: Internal Medicine

## 2018-03-17 DIAGNOSIS — E785 Hyperlipidemia, unspecified: Secondary | ICD-10-CM | POA: Diagnosis not present

## 2018-03-17 DIAGNOSIS — I77810 Thoracic aortic ectasia: Secondary | ICD-10-CM | POA: Insufficient documentation

## 2018-03-17 DIAGNOSIS — I358 Other nonrheumatic aortic valve disorders: Secondary | ICD-10-CM | POA: Insufficient documentation

## 2018-03-17 DIAGNOSIS — I10 Essential (primary) hypertension: Secondary | ICD-10-CM | POA: Diagnosis not present

## 2018-03-17 DIAGNOSIS — M069 Rheumatoid arthritis, unspecified: Secondary | ICD-10-CM | POA: Insufficient documentation

## 2018-03-17 DIAGNOSIS — R0789 Other chest pain: Secondary | ICD-10-CM | POA: Insufficient documentation

## 2018-03-17 MED ORDER — CARVEDILOL 3.125 MG PO TABS: 3.1250 mg | ORAL_TABLET | Freq: Two times a day (BID) | ORAL | 6 refills | Status: DC

## 2018-03-17 NOTE — Patient Instructions (Addendum)
Finish current bottle of bystolic - then stop this medicine Then start new prescription for Carvedilol (Coreg) 3.125 mg twice daily with food  Follow up with pharmacy clinic in 6 weeks  July 10 Wed at 1:30pm

## 2018-03-17 NOTE — Progress Notes (Signed)
PCP: Dr. Janett Billow Copland Cardiology: Dr. Aundra Dubin   HPI:   72 y.o. with history of rheumatoid arthritis, aortic insufficiency, mild ascending aortic dilatation, and suspected prior acute pericarditis returns for cardiology followup.  Patient's cardiac history begins in 2004 when she had possible acute pericarditis in [place].  LHC at that time showed no significant disease.  In our office, she has been followed for a malformed (though trileaflet) aortic valve.  She has had aortic insufficiency and a mildly dilated ascending aorta.  She moved to [place] in 2012 to help her daughter who has an autistic child.  She is back in [place] now.  She says she had a stress test at some time in 7 in [place].  She thinks it was normal.  Echo in 7/17 showed EF 65-70% with moderate AI.  CTA chest in 6/17 showed 3.9 cm ascending aorta.   She had what sounds like a pericarditis flare in 10/17 and was treated with colchicine and ibuprofen with improvement.  Cardiolite was ordered but she cancelled it.   Echo in 1/19 showed EF 60-65% with mild-moderate AI, mild AS.  CTA chest 2/19 showed 3.9 cm ascending aorta.    She returned  for followup of valvular heart disease and HTN.  BP was borderline elevated in the office, but tends to still run high when she checks at home. She was unable to tolerate a higher dose of Bystolic due to dizziness and weakness. She is short of breath walking up a hill, ok on flat ground.  Though most of her day is sedentary.  She is interested in being more social and maybe join a walking group   Rare atypical chest pain.    She presents today for initial Pharmacy med titration clinic.  At last appointment 4/19 with Dr. Aundra Dubin, HCTZ 25mg  daily was added - f/u Cr,K ok 4/26.  At today's visit her BP is better controled 136/70.   She wants to be able to stop medications  -discussed her BP goals and needs to continue current therapy.  Lipid panel was checked - was elevated and she was  referred to [place] lipid clinic.       Marland Kitchen Shortness of breath/dyspnea on exertion? no  . Orthopnea/PND? no . Edema? no . Lightheadedness/dizziness? no . Daily weights at home? yes . Blood pressure/heart rate monitoring at home? yes . Following low-sodium/fluid-restricted diet? yes  Cardiac Medications: amlodipine 10mg  daily bystolic 2.5mg  daily HCTZ 25mg  daily Irbesartan 300mg  daily  Has the patient been experiencing any side effects to the medications prescribed?  no  Does the patient have any problems obtaining medications due to transportation or finances?   Yes - asked for all generics  Understanding of regimen: good Understanding of indications: good Potential of compliance: good Patient understands to avoid NSAIDs. Patient understands to avoid decongestants.    Pertinent Lab Values: stable last drawn 02/21/18 . Serum creatinine 0.8 , CO2 24, Potassium 3.6, Sodium 136,   Vital Signs: . Weight: 146lb (dry weight: 143lb at home) . Blood pressure: 136/70 . Heart rate: 60 . O2 97%  Assessment: 1. Chronic HTN - volume status stable -med changes - patient is unhappy with taking Bystolic - mostly due to cost now since she is tolerating the lower doses and no longer dizzy or weak.  Plan to finish current bottle of Bystolic and then change to carvedilol 3.125mg  BID. - continue current therapy amlodipine 10mg  daily bystolic 2.5mg  daily HCTZ 25mg  daily Irbesartan 300mg  daily  - Basic  disease state pathophysiology, medication indication, mechanism and side effects reviewed at length with patient and he verbalized understanding  1. Aortic valve disorder: Malformed aortic valve with mild to moderate AI and mild AS on 1/19 echo. Suspect rheumatic heart disease per MD - Repeat echo at 2 years in 1/21 2. Ascending aorta dilation: Mild on CTA chest in 2/19.  Can repeat in 2 years in 2/21.  - per MD 3. HTN: BP is still high.   - Added HCTZ 25 mg daily at last visit .     4. She has some symptoms of OSA, which can potentiate high BP. MD to arrange for a sleep study.  5. Hyperlipidemia: referred to lipid clinic  6 Chest pain: Atypical, minimal.    Plan: 1) Medication changes: Based on clinical presentation, vital signs and recent labs will- finish current bottle of bystolic then change to generic alternative carvedilol 3.125mg  bid. 2) Labs: none today last stable  3) Follow-up: with Pharmacy Clinic in 6weeks, will get plugged into Lipid clinic  Dr Aundra Dubin 1 yr   Bonnita Nasuti Pharm.D. CPP, BCPS Clinical Pharmacist [phone] 03/17/2018 2:31 PM

## 2018-03-18 ENCOUNTER — Ambulatory Visit: Payer: Medicare Other | Admitting: Rheumatology

## 2018-03-19 ENCOUNTER — Telehealth: Payer: Self-pay | Admitting: Pharmacist

## 2018-03-19 DIAGNOSIS — E782 Mixed hyperlipidemia: Secondary | ICD-10-CM

## 2018-03-19 MED ORDER — ROSUVASTATIN CALCIUM 10 MG PO TABS: 10.0000 mg | ORAL_TABLET | Freq: Every day | ORAL | 11 refills | Status: DC

## 2018-03-19 NOTE — Telephone Encounter (Signed)
Spoke with pt to schedule visit in lipid clinic. Pt stated she wishes to resume Crestor which she took in the past and tolerated well. She previously tolerated 10mg  however experienced myalgias on 20mg  dosing. Will resume Crestor 10mg  daily and recheck lipids in 2-3 months. Can add Zetia or [id number] at that time if needed.

## 2018-03-27 DIAGNOSIS — D225 Melanocytic nevi of trunk: Secondary | ICD-10-CM | POA: Diagnosis not present

## 2018-03-27 DIAGNOSIS — L814 Other melanin hyperpigmentation: Secondary | ICD-10-CM | POA: Diagnosis not present

## 2018-03-27 DIAGNOSIS — D224 Melanocytic nevi of scalp and neck: Secondary | ICD-10-CM | POA: Diagnosis not present

## 2018-03-27 DIAGNOSIS — D2262 Melanocytic nevi of left upper limb, including shoulder: Secondary | ICD-10-CM | POA: Diagnosis not present

## 2018-03-27 DIAGNOSIS — L720 Epidermal cyst: Secondary | ICD-10-CM | POA: Diagnosis not present

## 2018-03-27 DIAGNOSIS — Z85828 Personal history of other malignant neoplasm of skin: Secondary | ICD-10-CM | POA: Diagnosis not present

## 2018-03-27 DIAGNOSIS — D1801 Hemangioma of skin and subcutaneous tissue: Secondary | ICD-10-CM | POA: Diagnosis not present

## 2018-03-27 DIAGNOSIS — L821 Other seborrheic keratosis: Secondary | ICD-10-CM | POA: Diagnosis not present

## 2018-03-27 DIAGNOSIS — Z85820 Personal history of malignant melanoma of skin: Secondary | ICD-10-CM | POA: Diagnosis not present

## 2018-03-27 DIAGNOSIS — C44311 Basal cell carcinoma of skin of nose: Secondary | ICD-10-CM | POA: Diagnosis not present

## 2018-03-27 DIAGNOSIS — D485 Neoplasm of uncertain behavior of skin: Secondary | ICD-10-CM | POA: Diagnosis not present

## 2018-03-30 ENCOUNTER — Other Ambulatory Visit: Payer: Self-pay | Admitting: Family Medicine

## 2018-03-30 DIAGNOSIS — R05 Cough: Secondary | ICD-10-CM

## 2018-03-30 DIAGNOSIS — R058 Other specified cough: Secondary | ICD-10-CM

## 2018-03-31 ENCOUNTER — Ambulatory Visit (HOSPITAL_BASED_OUTPATIENT_CLINIC_OR_DEPARTMENT_OTHER): Payer: Medicare Other | Attending: Cardiology | Admitting: Cardiology

## 2018-03-31 VITALS — Ht 59.0 in | Wt 143.0 lb

## 2018-03-31 DIAGNOSIS — R0683 Snoring: Secondary | ICD-10-CM | POA: Diagnosis not present

## 2018-03-31 DIAGNOSIS — Z79899 Other long term (current) drug therapy: Secondary | ICD-10-CM | POA: Insufficient documentation

## 2018-03-31 DIAGNOSIS — I10 Essential (primary) hypertension: Secondary | ICD-10-CM | POA: Insufficient documentation

## 2018-04-04 ENCOUNTER — Telehealth: Payer: Self-pay | Admitting: *Deleted

## 2018-04-04 NOTE — Telephone Encounter (Signed)
Received Dermatopathology Report results from [facility]; forwarded to provider/SLS 06/07

## 2018-04-10 NOTE — Procedures (Signed)
   Patient Name: Katherine, Wang Study Date:10/15/2017 03/31/2018 Gender: Female D.O.B: [birth year] Age (years): 72 Referring Provider: Loralie Champagne Height (inches): 46 Interpreting Physician: Fransico Him MD, ABSM Weight (lbs): 143 RPSGT: Jorge Ny BMI: 29 MRN: [id number] Neck Size: 13.00  CLINICAL INFORMATION Sleep Study Type: NPSG  Indication for sleep study: Hypertension, Snoring  Epworth Sleepiness Score: 6  SLEEP STUDY TECHNIQUE As per the AASM Manual for the Scoring of Sleep and Associated Events v2.3 (April 2016) with a hypopnea requiring 4% desaturations.  The channels recorded and monitored were frontal, central and occipital EEG, electrooculogram (EOG), submentalis EMG (chin), nasal and oral airflow, thoracic and abdominal wall motion, anterior tibialis EMG, snore microphone, electrocardiogram, and pulse oximetry.  MEDICATIONS Medications self-administered by patient taken the night of the study : AMOLODIPINE, CLONAZEPAM, ROSUVASTATIN, SERTRALINE, BYSTOLIC  SLEEP ARCHITECTURE The study was initiated at 10:05:33 PM and ended at 5:31:32 AM.  Sleep onset time was 27.7 minutes and the sleep efficiency was 75.8%%. The total sleep time was 338 minutes.  Stage REM latency was 386.5 minutes.  The patient spent 17.3%% of the night in stage N1 sleep, 73.5%% in stage N2 sleep, 0.0%% in stage N3 and 9.17% in REM.  Alpha intrusion was absent.  Supine sleep was 0.00%.  RESPIRATORY PARAMETERS The overall apnea/hypopnea index (AHI) was 0.5 per hour. There were 2 total apneas, including 1 obstructive, 1 central and 0 mixed apneas. There were 1 hypopneas and 28 RERAs.  The AHI during Stage REM sleep was 3.9 per hour.  AHI while supine was N/A per hour.  The mean oxygen saturation was 91.4%. The minimum SpO2 during sleep was 87.0%.  moderate snoring was noted during this study.  CARDIAC DATA The 2 lead EKG demonstrated sinus rhythm. The mean heart rate was 63.1 beats  per minute. Other EKG findings include: PACs.  LEG MOVEMENT DATA The total PLMS were 0 with a resulting PLMS index of 0.0. Associated arousal with leg movement index was 10.8 .  IMPRESSIONS - No significant obstructive sleep apnea occurred during this study (AHI = 0.5/h). - No significant central sleep apnea occurred during this study (CAI = 0.2/h). - Mild oxygen desaturation was noted during this study (Min O2 = 87.0%). - The patient snored with moderate snoring volume. - PACs were noted during this study. - Clinically significant periodic limb movements did not occur during sleep. Associated arousals were significant  DIAGNOSIS - Normal Study  RECOMMENDATIONS - Avoid alcohol, sedatives and other CNS depressants that may worsen sleep apnea and disrupt normal sleep architecture. - Sleep hygiene should be reviewed to assess factors that may improve sleep quality. - Weight management and regular exercise should be initiated or continued if appropriate.  [Electronically signed] 04/10/2018 03:02 PM  Fransico Him MD, ABSM Diplomate, American Board of Sleep Medicine

## 2018-04-14 ENCOUNTER — Telehealth: Payer: Self-pay | Admitting: *Deleted

## 2018-04-14 NOTE — Telephone Encounter (Signed)
-----   Message from Sueanne Margarita, MD sent at 04/10/2018  3:05 PM EDT ----- Please let patient know that sleep study showed no significant sleep apnea.

## 2018-04-14 NOTE — Telephone Encounter (Signed)
Informed patient of sleep study results and patient understanding was verbalized. Patient understands her sleep study showed no significant sleep apnea.   Pt is aware and agreeable to normal results.  

## 2018-04-22 DIAGNOSIS — Z85828 Personal history of other malignant neoplasm of skin: Secondary | ICD-10-CM | POA: Diagnosis not present

## 2018-04-22 DIAGNOSIS — Z85820 Personal history of malignant melanoma of skin: Secondary | ICD-10-CM | POA: Diagnosis not present

## 2018-04-22 DIAGNOSIS — C44311 Basal cell carcinoma of skin of nose: Secondary | ICD-10-CM | POA: Diagnosis not present

## 2018-04-29 NOTE — Progress Notes (Deleted)
Office Visit Note  Patient: Katherine Wang             Date of Birth: [birth year]           MRN: [id number]             PCP: [name], MD Referring: [name], MD Visit Date: 05/13/2018 Occupation: @GUAROCC @    Subjective:  No chief complaint on file.   History of Present Illness: Katherine Wang is a 72 y.o. female ***   Activities of Daily Living:  Patient reports morning stiffness for *** {minute/hour:19697}.   Patient {ACTIONS;DENIES/REPORTS:21021675::"Denies"} nocturnal pain.  Difficulty dressing/grooming: {ACTIONS;DENIES/REPORTS:21021675::"Denies"} Difficulty climbing stairs: {ACTIONS;DENIES/REPORTS:21021675::"Denies"} Difficulty getting out of chair: {ACTIONS;DENIES/REPORTS:21021675::"Denies"} Difficulty using hands for taps, buttons, cutlery, and/or writing: {ACTIONS;DENIES/REPORTS:21021675::"Denies"}   No Rheumatology ROS completed.   PMFS History:  Patient Active Problem List   Diagnosis Date Noted  . Moderate aortic regurgitation 11/02/2017  . Lightheadedness 11/02/2017  . Tachycardia 07/26/2017  . Atypical chest pain 07/26/2017  . High risk medications (not anticoagulants) long-term use 01/16/2017  . Baker's cyst of knee, left 12/19/2016  . Pain in both knees 12/19/2016  . Pain of both elbows 12/19/2016  . Malignant melanoma ([place]) 12/19/2016  . Basal cell carcinoma 12/19/2016  . Chronic pansinusitis 08/15/2016  . Deviated septum 08/15/2016  . Epistaxis 08/15/2016  . Hypertrophy, nasal, turbinate 08/15/2016  . Upper airway cough syndrome 04/25/2016  . Hx of adenomatous colonic polyps 06/21/2015  . Hyperlipidemia 11/15/2014  . Rheumatoid arthritis ([place]) 09/01/2014  . Pericarditis 04/24/2011  . Aortic valve disorder 02/14/2009  . ELECTROCARDIOGRAM, ABNORMAL 02/14/2009  . PERNICIOUS ANEMIA 07/10/2007  . DEPRESSION 07/03/2007  . MALAISE AND FATIGUE 07/03/2007  . HYPOTHYROIDISM 04/30/2007  . Essential hypertension 04/30/2007  . Aneurysm  of thoracic aorta ([place]) 02/13/2006    Past Medical History:  Diagnosis Date  . Adenomatous colon polyp 2012  . Allergy   . Anxiety   . Aortic insufficiency   . Arthritis   . Basal cell carcinoma   . Cataract   . Depression   . Diverticulosis   . Fibromyalgia   . Fibromyalgia   . Hyperlipidemia   . Hypertension   . Macular degeneration   . Melanoma ([place])   . Murmur, heart   . Reflux   . Stroke ([place]) 20 years ago   per pt. mild stroke  . Thyroid disease    hypothroidism     Family History  Problem Relation Age of Onset  . Heart disease Mother   . Hypertension Mother   . Mental illness Mother   . Allergies Mother   . Heart disease Father   . Hypertension Father   . Mental illness Father   . Emphysema Father        smoked  . Allergies Father   . Heart attack Father   . Hyperlipidemia Sister   . Hypertension Sister   . Allergies Sister   . Hypertension Sister   . Diabetes Sister   . Allergies Sister   . Epilepsy Son   . Aneurysm Son   . Hyperthyroidism Daughter   . Hypothyroidism Daughter   . Hypertension Daughter   . Colon cancer Neg Hx   . Esophageal cancer Neg Hx   . Rectal cancer Neg Hx   . Stomach cancer Neg Hx   . Pancreatic cancer Neg Hx   . Prostate cancer Neg Hx    Past Surgical History:  Procedure Laterality Date  . ABDOMINAL HYSTERECTOMY    .  APPENDECTOMY    . BASAL CELL CARCINOMA EXCISION    . CARDIAC CATHETERIZATION    . CHOLECYSTECTOMY    . COLONOSCOPY     30 + years ago, unsure of where  . EYE SURGERY    . LASIK Bilateral   . MELANOMA EXCISION    . NASAL SEPTUM SURGERY    . TUBAL LIGATION     Social History   Social History Narrative   Lives alone in a one story home.  Has 2 living children.  Retired Engineer, water.       Objective: Vital Signs: There were no vitals taken for this visit.   Physical Exam   Musculoskeletal Exam: ***  CDAI Exam: No CDAI exam completed.    Investigation: No additional findings. CBC Latest  Ref Rng & Units 02/11/2018 11/08/2017 05/09/2017  WBC 3.8 - 10.8 Thousand/uL 3.5(L) 3.6(L) 4.8  Hemoglobin 11.7 - 15.5 g/dL 13.1 12.7 14.0  Hematocrit 35.0 - 45.0 % 38.6 37.2 42.5  Platelets 140 - 400 Thousand/uL 226 231 231   CMP Latest Ref Rng & Units 02/21/2018 02/11/2018 11/08/2017  Glucose 65 - 99 mg/dL 105(H) 121(H) 96  BUN 6 - 20 mg/dL 10 16 15   Creatinine 0.44 - 1.00 mg/dL 0.82 0.80 0.91  Sodium 135 - 145 mmol/L 136 138 139  Potassium 3.5 - 5.1 mmol/L 3.6 4.2 3.9  Chloride 101 - 111 mmol/L 101 102 103  CO2 22 - 32 mmol/L 24 30 28   Calcium 8.9 - 10.3 mg/dL 9.4 9.3 9.3  Total Protein 6.1 - 8.1 g/dL - 6.3 6.2  Total Bilirubin 0.2 - 1.2 mg/dL - 0.8 0.5  Alkaline Phos 33 - 130 U/L - - -  AST 10 - 35 U/L - 46(H) 19  ALT 6 - 29 U/L - 43(H) 16    Imaging: No results found.  Speciality Comments: No specialty comments available.    Procedures:  No procedures performed Allergies: Tramadol and Statins   Assessment / Plan:     Visit Diagnoses: No diagnosis found.    Orders: No orders of the defined types were placed in this encounter.  No orders of the defined types were placed in this encounter.   Face-to-face time spent with patient was *** minutes. Greater than 50% of time was spent in counseling and coordination of care.  Follow-Up Instructions: No follow-ups on file.   Earnestine Mealing, CMA  Note - This record has been created using Editor, commissioning.  Chart creation errors have been sought, but may not always  have been located. Such creation errors do not reflect on  the standard of medical care.

## 2018-05-07 ENCOUNTER — Other Ambulatory Visit: Payer: Self-pay | Admitting: Family Medicine

## 2018-05-07 ENCOUNTER — Ambulatory Visit (HOSPITAL_COMMUNITY)
Admission: RE | Admit: 2018-05-07 | Discharge: 2018-05-07 | Disposition: A | Discharge status: Home / Self Care | Payer: Medicare Other | Source: Ambulatory Visit | Attending: Cardiology | Admitting: Cardiology

## 2018-05-07 VITALS — BP 122/78 | HR 74 | Wt 147.0 lb

## 2018-05-07 DIAGNOSIS — G4733 Obstructive sleep apnea (adult) (pediatric): Secondary | ICD-10-CM | POA: Diagnosis not present

## 2018-05-07 DIAGNOSIS — E785 Hyperlipidemia, unspecified: Secondary | ICD-10-CM | POA: Diagnosis not present

## 2018-05-07 DIAGNOSIS — I77819 Aortic ectasia, unspecified site: Secondary | ICD-10-CM | POA: Insufficient documentation

## 2018-05-07 DIAGNOSIS — I10 Essential (primary) hypertension: Secondary | ICD-10-CM | POA: Insufficient documentation

## 2018-05-07 DIAGNOSIS — R079 Chest pain, unspecified: Secondary | ICD-10-CM | POA: Insufficient documentation

## 2018-05-07 DIAGNOSIS — M069 Rheumatoid arthritis, unspecified: Secondary | ICD-10-CM | POA: Diagnosis not present

## 2018-05-07 DIAGNOSIS — I358 Other nonrheumatic aortic valve disorders: Secondary | ICD-10-CM | POA: Insufficient documentation

## 2018-05-07 LAB — BASIC METABOLIC PANEL
ANION GAP: 8 (ref 5–15)
BUN: 9 mg/dL (ref 8–23)
CHLORIDE: 97 mmol/L — AB (ref 98–111)
CO2: 27 mmol/L (ref 22–32)
Calcium: 8.9 mg/dL (ref 8.9–10.3)
Creatinine, Ser: 0.69 mg/dL (ref 0.44–1.00)
GFR calc Af Amer: >60 mL/min (ref >60)
GFR calc non Af Amer: >60 mL/min (ref >60)
GLUCOSE: 98 mg/dL (ref 70–99)
POTASSIUM: 3.7 mmol/L (ref 3.5–5.1)
Sodium: 132 mmol/L — ABNORMAL LOW (ref 135–145)

## 2018-05-07 NOTE — Patient Instructions (Addendum)
Continue to walk as tolerated Continue current medications Follow Up pharmacy HF clinic Sept 9 at 1:30

## 2018-05-07 NOTE — Progress Notes (Signed)
PCP: Dr. Janett Billow Copland Cardiology: Dr. Aundra Dubin  HPI:   72 y.o.with history of rheumatoid arthritis, aortic insufficiency, mild ascending aortic dilatation, and suspected prior acute pericarditis returns for cardiology followup. Patient's cardiac history begins in 2004 when she had possible acute pericarditis in [place]. LHC at that time showed no significant disease. In our office, she has been followed for a malformed (though trileaflet) aortic valve. She has had aortic insufficiency and a mildly dilated ascending aorta. She moved to [place] in 2012 to help her daughter who has an autistic child. She is back in [place] now. She says she had a stress test at some time in 50 in [place]. She thinks it was normal. Echo in 7/17 showed EF 65-70% with moderate AI. CTA chest in 6/17 showed 3.9 cm ascending aorta.   She had what sounds like a pericarditis flare in 10/17 and was treated with colchicine and ibuprofen with improvement. Cardiolite was ordered but she cancelled it.   Echo in 1/19 showed EF 60-65% with mild-moderate AI, mild AS. CTA chest 2/19 showed 3.9 cm ascending aorta.   She returned  for followup of valvular heart disease and HTN. BP was borderline elevated in the office, but tends to still run high when she checks at home. She was unable to tolerate a higher dose of Bystolic due to dizziness and weakness. She is short of breath walking up a hill, ok on flat ground.  Though most of her day is sedentary.  She is interested in being more social and maybe join a walking group  Rare atypical chest pain.   She was seen in May for initial Pharmacy medication titration clinic. She had recently been started on  HCTZ 25mg  daily.   Today she returns to Pharmacist clinic with no complaints.  She is able to perform ADLs with little trouble, she walks to mailbox and back every day - there is a little grade so she stops about half way up hill to catch breath.  At last visit  she was switched from nebivilol to carvedilol 3.125mg  BID.  She is tolerating the change well.  She also was in contact with Lipid clinic and was restarted on Crestor 10mg  daily - planned follow up end of July.       . Shortness of breath/dyspnea on exertion? yes - with exertion . Orthopnea/PND? no . Edema? no . Lightheadedness/dizziness? Yes occasionally . Daily weights at home? yes . Blood pressure/heart rate monitoring at home? no . Following low-sodium/fluid-restricted diet? no  BP Medications: Amlodipine 10mg  Daily Irbesartan 300mg  Daily HCTZ 25mg  Daily Carvedilol 3.125mg  BID  Has the patient been experiencing any side effects to the medications prescribed?  yes  Does the patient have any problems obtaining medications due to transportation or finances?   yes  Understanding of regimen: fair Understanding of indications: fair Potential of compliance: fair Patient understands to avoid NSAIDs. Patient understands to avoid decongestants.    Pertinent Lab Values: 05/07/18 . Serum creatinine 0.69, CO2 27, Potassium 3.7 , Sodium 132,    Vital Signs: . Weight: 147 lb (dry weight: 143lb at home) . Blood pressure: 122/78 . Heart rate: 74 . O2 98%  Assessment: 1. Chronic HTN - volume status stable -BP improved 122/78, HR stable 74 occasional lightheadedness but not limiting, able to walk and perform ADLs -  Labs today - Cr stable , K stable  no medication changes at the time. - continue current therapy Amlodipine 10mg  Daily Carvedilol 3.125mg  BID HCTZ 25mg  daily Irbesartan  300mg  daily  - Basic disease state pathophysiology, medication indication, mechanism and side effects reviewed at length with patient and he verbalized understanding  Aortic valve disorder: Malformed aortic valve withmild to moderate AI and mild AS on 1/19 echo. Suspect rheumatic heart disease per MD - Repeat echo at 2 years in 1/21 2. Ascending aorta dilation: Mild on CTA chest in2/19. Can repeat  in 2 years in 2/21. - per MD 3. She has some symptoms of OSA, which can potentiate high BP. MD to arrange for a sleep study. 4. Hyperlipidemia: referred to lipid clinic 5. Chest pain: Atypical, minimal.    Plan: 1) Medication changes: Based on clinical presentation - volume status stable - vital signs -BP improved, HR stable  - Labs today - Cr stable , K stable  -no medication changes at the time. - continue current therapy Amlodipine 10mg  Daily Carvedilol 3.125mg  BID HCTZ 25mg  daily Irbesartan 300mg  daily  2) Labs: Lipid panel July 22  3) Follow-up: Pharmacist clinic September 9    Bea Graff.D. CPP, BCPS Clinical Pharmacist [phone] 05/07/2018 4:13 PM

## 2018-05-08 ENCOUNTER — Telehealth: Payer: Self-pay | Admitting: Licensed Clinical Social Worker

## 2018-05-08 NOTE — Telephone Encounter (Signed)
CSW received referral to assist with housing options. CSW attempted to reach patient and message left. Raquel Sarna, [place], [place]

## 2018-05-08 NOTE — Telephone Encounter (Signed)
[facility] won't allow me to log in

## 2018-05-08 NOTE — Telephone Encounter (Signed)
Requesting:Clonazepam Contract:none, needs csc OTR:RNHA, needs uds Last Visit:02/24/18 Next Visit:none with pcp Last Refill:02/12/18 2 refills  Please Advise

## 2018-05-13 ENCOUNTER — Ambulatory Visit: Payer: Medicare Other | Admitting: Physician Assistant

## 2018-05-19 ENCOUNTER — Other Ambulatory Visit: Payer: Medicare Other | Admitting: *Deleted

## 2018-05-19 ENCOUNTER — Other Ambulatory Visit: Payer: Self-pay | Admitting: Rheumatology

## 2018-05-19 ENCOUNTER — Telehealth: Payer: Self-pay | Admitting: Pharmacist

## 2018-05-19 DIAGNOSIS — E785 Hyperlipidemia, unspecified: Secondary | ICD-10-CM

## 2018-05-19 DIAGNOSIS — E782 Mixed hyperlipidemia: Secondary | ICD-10-CM

## 2018-05-19 LAB — LIPID PANEL
CHOL/HDL RATIO: 3.1 ratio (ref 0.0–4.4)
Cholesterol, Total: 153 mg/dL (ref 100–199)
HDL: 49 mg/dL (ref >39)
LDL Calculated: 90 mg/dL (ref 0–99)
Triglycerides: 69 mg/dL (ref 0–149)
VLDL CHOLESTEROL CAL: 14 mg/dL (ref 5–40)

## 2018-05-19 MED ORDER — EZETIMIBE 10 MG PO TABS: 10.0000 mg | ORAL_TABLET | Freq: Every day | ORAL | 3 refills | Status: DC

## 2018-05-19 NOTE — Telephone Encounter (Signed)
Last visit: 02/13/18 Next visit was due in July 2019. Message sent to the front to schedule patient.  Labs: 02/11/18 Elevated glucose AST 46, ALT 43  Okay to refill per Dr. Estanislado Pandy

## 2018-05-19 NOTE — Telephone Encounter (Signed)
Called pt in regard to cholesterol results. LDL improved on rosuvastatin 10mg  daily, but still not at goal <70. Will add ezetimbe 10mg  daily to current regimen as has been unable to tolerate dose increase previously. Pt states understanding. Lipid and hepatic panel reordered for 2 months.

## 2018-05-21 ENCOUNTER — Ambulatory Visit: Payer: Medicare Other | Admitting: Rheumatology

## 2018-05-21 NOTE — Progress Notes (Signed)
Office Visit Note  Patient: Katherine Wang             Date of Birth: 04/02/1946           MRN: [id number]             PCP: [name], MD Referring: [name], MD Visit Date: 05/23/2018 Occupation: @GUAROCC @  Subjective:  Pain in bilateral hands and feet.   History of Present Illness: Katherine Wang is a 72 y.o. female with history of rheumatoid arthritis and osteoarthritis.  She states she continues to have some discomfort in her bilateral hands and bilateral feet.  She does have osteoarthritis in her knee joints which are not causing much discomfort currently.   Activities of Daily Living:  Patient reports morning stiffness for 0 minutes.   Patient Reports nocturnal pain.  Difficulty dressing/grooming: Denies Difficulty climbing stairs: Reports Difficulty getting out of chair: Denies Difficulty using hands for taps, buttons, cutlery, and/or writing: Reports  Review of Systems  Constitutional: Negative for fatigue, night sweats, weight gain and weight loss.  HENT: Negative for mouth sores, trouble swallowing, trouble swallowing, mouth dryness and nose dryness.   Eyes: Positive for dryness. Negative for pain, redness and visual disturbance.  Respiratory: Negative for cough, shortness of breath and difficulty breathing.   Cardiovascular: Negative for chest pain, palpitations, hypertension, irregular heartbeat and swelling in legs/feet.  Gastrointestinal: Positive for constipation. Negative for abdominal pain, blood in stool and diarrhea.  Endocrine: Negative for increased urination.  Genitourinary: Negative for pelvic pain and vaginal dryness.  Musculoskeletal: Positive for arthralgias and joint pain. Negative for joint swelling, myalgias, muscle weakness, morning stiffness, muscle tenderness and myalgias.  Skin: Negative for color change, rash, hair loss, skin tightness, ulcers and sensitivity to sunlight.  Allergic/Immunologic: Negative for susceptible to  infections.  Neurological: Negative for dizziness, light-headedness, headaches, memory loss, night sweats and weakness.  Hematological: Negative for bruising/bleeding tendency and swollen glands.  Psychiatric/Behavioral: Negative for depressed mood, confusion and sleep disturbance. The patient is not nervous/anxious.     PMFS History:  Patient Active Problem List   Diagnosis Date Noted  . Moderate aortic regurgitation 11/02/2017  . Lightheadedness 11/02/2017  . Tachycardia 07/26/2017  . Atypical chest pain 07/26/2017  . High risk medications (not anticoagulants) long-term use 01/16/2017  . Baker's cyst of knee, left 12/19/2016  . Pain in both knees 12/19/2016  . Pain of both elbows 12/19/2016  . Malignant melanoma ([place]) 12/19/2016  . Basal cell carcinoma 12/19/2016  . Chronic pansinusitis 08/15/2016  . Deviated septum 08/15/2016  . Epistaxis 08/15/2016  . Hypertrophy, nasal, turbinate 08/15/2016  . Upper airway cough syndrome 04/25/2016  . Hx of adenomatous colonic polyps 06/21/2015  . Hyperlipidemia 11/15/2014  . Rheumatoid arthritis ([place]) 09/01/2014  . Pericarditis 04/24/2011  . Aortic valve disorder 02/14/2009  . ELECTROCARDIOGRAM, ABNORMAL 02/14/2009  . PERNICIOUS ANEMIA 07/10/2007  . DEPRESSION 07/03/2007  . MALAISE AND FATIGUE 07/03/2007  . HYPOTHYROIDISM 04/30/2007  . Essential hypertension 04/30/2007  . Aneurysm of thoracic aorta ([place]) 02/13/2006    Past Medical History:  Diagnosis Date  . Adenomatous colon polyp 2012  . Allergy   . Anxiety   . Aortic insufficiency   . Arthritis   . Basal cell carcinoma   . Cataract   . Depression   . Diverticulosis   . Fibromyalgia   . Fibromyalgia   . Hyperlipidemia   . Hypertension   . Macular degeneration   . Melanoma ([place])   .  Murmur, heart   . Reflux   . Stroke ([place]) 20 years ago   per pt. mild stroke  . Thyroid disease    hypothroidism     Family History  Problem Relation Age of Onset  . Heart disease  Mother   . Hypertension Mother   . Mental illness Mother   . Allergies Mother   . Heart disease Father   . Hypertension Father   . Mental illness Father   . Emphysema Father        smoked  . Allergies Father   . Heart attack Father   . Hyperlipidemia Sister   . Hypertension Sister   . Allergies Sister   . Hypertension Sister   . Diabetes Sister   . Allergies Sister   . Epilepsy Son   . Aneurysm Son   . Hyperthyroidism Daughter   . Hypothyroidism Daughter   . Hypertension Daughter   . Colon cancer Neg Hx   . Esophageal cancer Neg Hx   . Rectal cancer Neg Hx   . Stomach cancer Neg Hx   . Pancreatic cancer Neg Hx   . Prostate cancer Neg Hx    Past Surgical History:  Procedure Laterality Date  . ABDOMINAL HYSTERECTOMY    . APPENDECTOMY    . BASAL CELL CARCINOMA EXCISION     nose   . CARDIAC CATHETERIZATION    . CHOLECYSTECTOMY    . COLONOSCOPY     30 + years ago, unsure of where  . EYE SURGERY    . LASIK Bilateral   . MELANOMA EXCISION    . NASAL SEPTUM SURGERY    . TUBAL LIGATION     Social History   Social History Narrative   Lives alone in a one story home.  Has 2 living children.  Retired Engineer, water.      Objective: Vital Signs: BP 122/80 (BP Location: Left Arm, Patient Position: Sitting, Cuff Size: Normal)   Pulse 81   Resp 13   Ht 4\' 11"  (1.499 m)   Wt 150 lb (68 kg)   BMI 30.30 kg/m    Physical Exam  Constitutional: She is oriented to person, place, and time. She appears well-developed and well-nourished.  HENT:  Head: Normocephalic and atraumatic.  Eyes: Conjunctivae and EOM are normal.  Neck: Normal range of motion.  Cardiovascular: Normal rate, regular rhythm, normal heart sounds and intact distal pulses.  Pulmonary/Chest: Effort normal and breath sounds normal.  Abdominal: Soft. Bowel sounds are normal.  Lymphadenopathy:    She has no cervical adenopathy.  Neurological: She is alert and oriented to person, place, and time.  Skin: Skin  is warm and dry. Capillary refill takes less than 2 seconds.  Psychiatric: She has a normal mood and affect. Her behavior is normal.  Nursing note and vitals reviewed.    Musculoskeletal Exam: C-spine thoracic lumbar spine good range of motion.  Shoulder joints elbow joints wrist joints were in good range of motion.  She has bilateral CMC bilateral PIP and DIP thickening.  No synovitis was noted.  Synovial thickening over MCP joints was noted.  Hip joints knee joints, ankle joints were in good range of motion.  She has some osteoarthritic changes in her feet with bilateral first MTP thickening and PIP/DIP thickening.  CDAI Exam: CDAI Homunculus Exam:   Joint Counts:  CDAI Tender Joint count: 0 CDAI Swollen Joint count: 0  Global Assessments:  Patient Global Assessment: 2 Provider Global Assessment: 2  CDAI Calculated Score:  4   Investigation: No additional findings.  Imaging: No results found.  Recent Labs: Lab Results  Component Value Date   WBC 3.5 (L) 02/11/2018   HGB 13.1 02/11/2018   PLT 226 02/11/2018   NA 132 (L) 05/07/2018   K 3.7 05/07/2018   CL 97 (L) 05/07/2018   CO2 27 05/07/2018   GLUCOSE 98 05/07/2018   BUN 9 05/07/2018   CREATININE 0.69 05/07/2018   BILITOT 0.8 02/11/2018   ALKPHOS 95 05/09/2017   AST 46 (H) 02/11/2018   ALT 43 (H) 02/11/2018   PROT 6.3 02/11/2018   ALBUMIN 4.1 05/09/2017   CALCIUM 8.9 05/07/2018   GFRAA >60 05/07/2018    Speciality Comments: No specialty comments available.  Procedures:  No procedures performed Allergies: Tramadol and Statins   Assessment / Plan:     Visit Diagnoses: Rheumatoid arthritis involving multiple sites with positive rheumatoid factor (HCC) -  +RF, -CCP, -ANA .  Patient has no active synovitis on examination today.  High risk medication use - MTX 0.6 ml sq qwk, Arava 10 mg po qd , folic acid 2mg  po qd. we will check CBC and CMP today and then every 3 months to monitor for drug toxicity.  Primary  osteoarthritis of both hands-she does have severe osteoarthritis in her bilateral hands.  Joint protection muscle strengthening was discussed.  Primary osteoarthritis of both knees-she is osteoarthritis in her knee joints which is not causing much discomfort.  Primary osteoarthritis of both feet-joint protection muscle strengthening and proper fitting shoes were discussed.  She is on vitamin D supplement.  History of vitamin D deficiency-  History of melanoma - She was treated with Remicade in the past which worked really well for her. But due to history of melanoma she had to come off  the Biologics.  Other medical problems are listed as follows:  History of macular degeneration  History of asthma  History of neuropathy  History of cholecystectomy  History of hypertension  History of depression  History of basal cell carcinoma  Aortic valve disorder  History of anemia  History of anxiety  History of hypercholesterolemia  History of hypothyroidism   Orders: Orders Placed This Encounter  Procedures  . CBC with Differential/Platelet  . COMPLETE METABOLIC PANEL WITH GFR   No orders of the defined types were placed in this encounter.   Face-to-face time spent with patient was 30 minutes. Greater than 50% of time was spent in counseling and coordination of care.  Follow-Up Instructions: Return in about 5 months (around 10/23/2018) for Rheumatoid arthritis, Osteoarthritis.   Bo Merino, MD  Note - This record has been created using Editor, commissioning.  Chart creation errors have been sought, but may not always  have been located. Such creation errors do not reflect on  the standard of medical care.

## 2018-05-23 ENCOUNTER — Encounter: Payer: Self-pay | Admitting: Rheumatology

## 2018-05-23 ENCOUNTER — Ambulatory Visit (INDEPENDENT_AMBULATORY_CARE_PROVIDER_SITE_OTHER): Payer: Medicare Other | Admitting: Rheumatology

## 2018-05-23 VITALS — BP 122/80 | HR 81 | Resp 13 | Ht 59.0 in | Wt 150.0 lb

## 2018-05-23 DIAGNOSIS — Z79899 Other long term (current) drug therapy: Secondary | ICD-10-CM | POA: Diagnosis not present

## 2018-05-23 DIAGNOSIS — Z8679 Personal history of other diseases of the circulatory system: Secondary | ICD-10-CM

## 2018-05-23 DIAGNOSIS — M19072 Primary osteoarthritis, left ankle and foot: Secondary | ICD-10-CM

## 2018-05-23 DIAGNOSIS — Z8669 Personal history of other diseases of the nervous system and sense organs: Secondary | ICD-10-CM | POA: Diagnosis not present

## 2018-05-23 DIAGNOSIS — M170 Bilateral primary osteoarthritis of knee: Secondary | ICD-10-CM

## 2018-05-23 DIAGNOSIS — Z9049 Acquired absence of other specified parts of digestive tract: Secondary | ICD-10-CM | POA: Diagnosis not present

## 2018-05-23 DIAGNOSIS — M19041 Primary osteoarthritis, right hand: Secondary | ICD-10-CM

## 2018-05-23 DIAGNOSIS — Z85828 Personal history of other malignant neoplasm of skin: Secondary | ICD-10-CM | POA: Diagnosis not present

## 2018-05-23 DIAGNOSIS — Z85820 Personal history of malignant melanoma of skin: Secondary | ICD-10-CM

## 2018-05-23 DIAGNOSIS — Z8709 Personal history of other diseases of the respiratory system: Secondary | ICD-10-CM

## 2018-05-23 DIAGNOSIS — I359 Nonrheumatic aortic valve disorder, unspecified: Secondary | ICD-10-CM

## 2018-05-23 DIAGNOSIS — M19071 Primary osteoarthritis, right ankle and foot: Secondary | ICD-10-CM

## 2018-05-23 DIAGNOSIS — Z862 Personal history of diseases of the blood and blood-forming organs and certain disorders involving the immune mechanism: Secondary | ICD-10-CM

## 2018-05-23 DIAGNOSIS — Z8639 Personal history of other endocrine, nutritional and metabolic disease: Secondary | ICD-10-CM | POA: Diagnosis not present

## 2018-05-23 DIAGNOSIS — M0579 Rheumatoid arthritis with rheumatoid factor of multiple sites without organ or systems involvement: Secondary | ICD-10-CM

## 2018-05-23 DIAGNOSIS — M19042 Primary osteoarthritis, left hand: Secondary | ICD-10-CM

## 2018-05-23 DIAGNOSIS — Z8659 Personal history of other mental and behavioral disorders: Secondary | ICD-10-CM | POA: Diagnosis not present

## 2018-05-23 NOTE — Patient Instructions (Signed)
Standing Labs We placed an order today for your standing lab work.   Please come back and get your standing labs in October and every 3 months   We have open lab Monday through Friday from 8:30-11:30 AM and 1:30-4:00 PM  at the office of Dr. Hadleigh Felber.   You may experience shorter wait times on Monday and Friday afternoons. The office is located at [address], [address], [place],  [address] No appointment is necessary.   Labs are drawn by [facility].  You may receive a bill from [facility] for your lab work. If you have any questions regarding directions or hours of operation,  please call [phone].    

## 2018-05-24 LAB — CBC WITH DIFFERENTIAL/PLATELET
BASOS PCT: 0.5 %
Basophils Absolute: 19 cells/uL (ref 0–200)
EOS ABS: 93 {cells}/uL (ref 15–500)
EOS PCT: 2.5 %
HCT: 36.5 % (ref 35.0–45.0)
HEMOGLOBIN: 12.6 g/dL (ref 11.7–15.5)
Lymphs Abs: 1151 cells/uL (ref 850–3900)
MCH: 31.3 pg (ref 27.0–33.0)
MCHC: 34.5 g/dL (ref 32.0–36.0)
MCV: 90.6 fL (ref 80.0–100.0)
MONOS PCT: 5.5 %
MPV: 9.8 fL (ref 7.5–12.5)
NEUTROS ABS: 2235 {cells}/uL (ref 1500–7800)
Neutrophils Relative %: 60.4 %
PLATELETS: 227 10*3/uL (ref 140–400)
RBC: 4.03 10*6/uL (ref 3.80–5.10)
RDW: 16.3 % — ABNORMAL HIGH (ref 11.0–15.0)
TOTAL LYMPHOCYTE: 31.1 %
WBC mixed population: 204 cells/uL (ref 200–950)
WBC: 3.7 10*3/uL — AB (ref 3.8–10.8)

## 2018-05-24 LAB — COMPLETE METABOLIC PANEL WITH GFR
AG RATIO: 1.5 (calc) (ref 1.0–2.5)
ALKALINE PHOSPHATASE (APISO): 81 U/L (ref 33–130)
ALT: 14 U/L (ref 6–29)
AST: 19 U/L (ref 10–35)
Albumin: 4 g/dL (ref 3.6–5.1)
BUN: 13 mg/dL (ref 7–25)
CO2: 28 mmol/L (ref 20–32)
Calcium: 9.2 mg/dL (ref 8.6–10.4)
Chloride: 100 mmol/L (ref 98–110)
Creat: 0.63 mg/dL (ref 0.60–0.93)
GFR, Est African American: 102 mL/min/{1.73_m2} (ref >=60)
GFR, Est Non African American: 88 mL/min/{1.73_m2} (ref >=60)
Globulin: 2.6 g/dL (calc) (ref 1.9–3.7)
Glucose, Bld: 103 mg/dL — ABNORMAL HIGH (ref 65–99)
POTASSIUM: 3.7 mmol/L (ref 3.5–5.3)
Sodium: 136 mmol/L (ref 135–146)
Total Bilirubin: 0.9 mg/dL (ref 0.2–1.2)
Total Protein: 6.6 g/dL (ref 6.1–8.1)

## 2018-05-31 ENCOUNTER — Other Ambulatory Visit: Payer: Self-pay | Admitting: Rheumatology

## 2018-05-31 ENCOUNTER — Other Ambulatory Visit: Payer: Self-pay | Admitting: Family Medicine

## 2018-05-31 DIAGNOSIS — E034 Atrophy of thyroid (acquired): Secondary | ICD-10-CM

## 2018-06-04 ENCOUNTER — Other Ambulatory Visit: Payer: Self-pay | Admitting: Internal Medicine

## 2018-06-04 ENCOUNTER — Other Ambulatory Visit (HOSPITAL_COMMUNITY): Payer: Self-pay

## 2018-06-04 ENCOUNTER — Other Ambulatory Visit: Payer: Self-pay | Admitting: Rheumatology

## 2018-06-04 MED ORDER — IRBESARTAN 300 MG PO TABS: 300.0000 mg | ORAL_TABLET | Freq: Every day | ORAL | 11 refills | Status: DC

## 2018-06-04 NOTE — Telephone Encounter (Signed)
Refill request

## 2018-06-23 DIAGNOSIS — H353132 Nonexudative age-related macular degeneration, bilateral, intermediate dry stage: Secondary | ICD-10-CM | POA: Diagnosis not present

## 2018-06-24 ENCOUNTER — Other Ambulatory Visit: Payer: Self-pay | Admitting: Rheumatology

## 2018-06-24 ENCOUNTER — Other Ambulatory Visit: Payer: Self-pay | Admitting: Internal Medicine

## 2018-06-24 NOTE — Telephone Encounter (Signed)
Last Visit: 05/23/18 Next Visit: 11/03/18 Labs: 05/23/18 WBC count stable. Glucose is 103. All other labs are WNL.  Okay to refill per Dr. Estanislado Pandy

## 2018-06-25 ENCOUNTER — Other Ambulatory Visit (HOSPITAL_COMMUNITY): Payer: Self-pay

## 2018-06-25 MED ORDER — HYDROCHLOROTHIAZIDE 25 MG PO TABS: 25.0000 mg | ORAL_TABLET | Freq: Every day | ORAL | 5 refills | Status: DC

## 2018-06-25 NOTE — Telephone Encounter (Signed)
Dr. Aundra Dubin prescribed this medication. Please address

## 2018-06-25 NOTE — Telephone Encounter (Signed)
Refill Request.  

## 2018-06-26 ENCOUNTER — Other Ambulatory Visit (HOSPITAL_COMMUNITY): Payer: Self-pay | Admitting: Pharmacist

## 2018-06-26 MED ORDER — AMLODIPINE BESYLATE 10 MG PO TABS: 10.0000 mg | ORAL_TABLET | Freq: Every day | ORAL | 5 refills | Status: DC

## 2018-06-27 ENCOUNTER — Other Ambulatory Visit: Payer: Self-pay | Admitting: Rheumatology

## 2018-06-27 NOTE — Telephone Encounter (Signed)
Last Visit: 05/23/18 Next Visit: 11/03/18 Labs: 05/23/18 WBC count stable. Glucose is 103. All other labs are WNL.  Okay to refill per Dr. Estanislado Pandy

## 2018-07-02 ENCOUNTER — Other Ambulatory Visit: Payer: Self-pay | Admitting: Family Medicine

## 2018-07-02 NOTE — Telephone Encounter (Signed)
Requesting:clonazepam Contract:none, needs csc [id number], needs uds Last Visit:02/24/18 acute visit Next Visit:none with pcp Last Refill:05/08/18 1 refill  Please Advise

## 2018-07-21 ENCOUNTER — Other Ambulatory Visit: Payer: Self-pay

## 2018-08-05 ENCOUNTER — Other Ambulatory Visit: Payer: Self-pay | Admitting: Family Medicine

## 2018-08-05 NOTE — Progress Notes (Signed)
HF MD: [facility]  HPI:  72 y.o.with history of rheumatoid arthritis, aortic insufficiency, mild ascending aortic dilatation, and suspected prior acute pericarditis returns for cardiology followup. Patient's cardiac history begins in 2004 when she had possible acute pericarditis in [place]. LHC at that time showed no significant disease. In our office, she has been followed for a malformed (though trileaflet) aortic valve. She has had aortic insufficiency and a mildly dilated ascending aorta. She moved to [place] in 2012 to help her daughter who has an autistic child. She is back in [place] now. She says she had a stress test at some time in 72 in [place]. She thinks it was normal. Echo in 7/17 showed EF 65-70% with moderate AI. CTA chest in 6/17 showed 3.9 cm ascending aorta.   She had what sounds like a pericarditis flare in 10/17 and was treated with colchicine and ibuprofen with improvement. Cardiolite was ordered but she cancelled it.   Echo in 1/19 showed EF 60-65% with mild-moderate AI, mild AS. CTA chest 2/19 showed 3.9 cm ascending aorta.   She was seen in May for initial Pharmacy medication titration clinic. She had recently been started on HCTZ 25mg  daily.   Today she returns for pharmacist-led HTN medication titration. At last pharmacy-led HF clinic visit on 7/10, no medication changes were made. She feels well since last visit and her BP is under much better control. She does have dizziness maybe 1-2x per week usually when she looks up for too long. She is still having a dry, hacking cough but it is tolerable. She did have her sleep study done and her results did not indicate that she had OSA but she did not have a good experience with the personnel and is unsure if her results are accurate since the RN did not seem to be paying attention to her and was on her phone most of the evening.     . Shortness of breath/dyspnea on exertion? no  . Orthopnea/PND? no . Edema?  no . Lightheadedness/dizziness? Yes - sometimes maybe 1-2 x per day . Daily weights at home? no . Blood pressure/heart rate monitoring at home? Yes - consistently at goal so stopped measuring it at home . Following low-sodium/fluid-restricted diet? yes  HTN Medications: Amlodipine 10 mg PO QHS Carvedilol 3.125 mg PO BID HCTZ 25 mg PO daily Irbesartan 300 mg PO daily  Has the patient been experiencing any side effects to the medications prescribed?  no  Does the patient have any problems obtaining medications due to transportation or finances?   No - [employer] FEP commercial although Zetia is $50/mo  Understanding of regimen: good Understanding of indications: good Potential of compliance: good Patient understands to avoid NSAIDs. Patient understands to avoid decongestants.    Pertinent Lab Values: . 08/06/2018: Serum creatinine 0.72, BUN 11, Potassium 3.7, Sodium 134  Vital Signs: . Weight: 149.6 lb (dry weight: 150 lb) . Blood pressure: 112/70 mmHg  . Heart rate: 81 bpm    Assessment: 1. HTN  - Now consistently at BP goal of <140/80 mmHg    - Continue current regimen of amlodipine 10 mg QHS, carvedilol 3.125 mg BID, hctz 25 mg daily and irbesartan 300 mg daily and monitor for any increased dizziness/lightheadedness  - Basic disease state pathophysiology, medication indication, mechanism and side effects reviewed at length with patient and she verbalized understanding  2. Aortic valve disorder: Malformed aortic valve with mild to moderate AI and mild AS on 1/19 echo. Suspect rheumatic heart disease. -  Repeat echo at 2 years in 1/21  3. Ascending aorta dilation: Mild on CTA chest in 2/19.  Can repeat in 2 years in 2/21.    4. Hyperlipidemia:   - Continue atorvastatin and start Zetia as recommended per [facility]id Clinic   - Recommended that she ask the lipid clinic to send in Rx for generic Zetia as this may be less costly through her insurance   5. Chest pain: Atypical, minimal    Plan: 1) Medication changes: Based on clinical presentation, vital signs and recent labs will continue current regimen 2) Labs: BMET today 3) Follow-up: Dr. Aundra Dubin in April 2020   Jenkins. Velva Harman, PharmD, BCPS, CPP Clinical Pharmacist Phone: [phone] 08/05/2018 4:05 PM

## 2018-08-06 ENCOUNTER — Ambulatory Visit (HOSPITAL_COMMUNITY)
Admission: RE | Admit: 2018-08-06 | Discharge: 2018-08-06 | Disposition: A | Discharge status: Home / Self Care | Payer: Medicare Other | Source: Ambulatory Visit | Attending: Internal Medicine | Admitting: Internal Medicine

## 2018-08-06 ENCOUNTER — Encounter (HOSPITAL_COMMUNITY): Payer: Self-pay

## 2018-08-06 VITALS — BP 112/70 | HR 81 | Wt 149.4 lb

## 2018-08-06 DIAGNOSIS — Z79899 Other long term (current) drug therapy: Secondary | ICD-10-CM | POA: Diagnosis not present

## 2018-08-06 DIAGNOSIS — I10 Essential (primary) hypertension: Secondary | ICD-10-CM | POA: Diagnosis not present

## 2018-08-06 DIAGNOSIS — I358 Other nonrheumatic aortic valve disorders: Secondary | ICD-10-CM | POA: Insufficient documentation

## 2018-08-06 DIAGNOSIS — E785 Hyperlipidemia, unspecified: Secondary | ICD-10-CM | POA: Diagnosis not present

## 2018-08-06 LAB — BASIC METABOLIC PANEL
ANION GAP: 8 (ref 5–15)
BUN: 11 mg/dL (ref 8–23)
CHLORIDE: 99 mmol/L (ref 98–111)
CO2: 27 mmol/L (ref 22–32)
Calcium: 9.1 mg/dL (ref 8.9–10.3)
Creatinine, Ser: 0.72 mg/dL (ref 0.44–1.00)
GFR calc Af Amer: >60 mL/min (ref >60)
GFR calc non Af Amer: >60 mL/min (ref >60)
GLUCOSE: 83 mg/dL (ref 70–99)
POTASSIUM: 3.7 mmol/L (ref 3.5–5.1)
Sodium: 134 mmol/L — ABNORMAL LOW (ref 135–145)

## 2018-08-06 NOTE — Patient Instructions (Signed)
It was great to meet you today!  Please continue your current medications and call us with any increased dizziness/lightheadedness.   Blood work today. We will call you with any changes.   We will call you to schedule your next visit with Dr. Aundra Dubin in April.

## 2018-08-07 ENCOUNTER — Encounter: Payer: Self-pay | Admitting: Family Medicine

## 2018-08-07 NOTE — Telephone Encounter (Signed)
Requesting:clonazepam Contract:none, needs csc SNG:[id number], needs uds Last Visit:02/24/18 Next Visit:none Last Refill:07/02/18  Please Advise

## 2018-08-09 NOTE — Progress Notes (Addendum)
[place] [facility] [address], [place], [place] [address] [phone]  Date:  08/13/2018   Name:  Katherine Wang   DOB:  February 15, 1946   MRN:  [id number]  PCP:  [name], MD    Chief Complaint: Hypertension (6 month follow up) and Hypothyroidism   History of Present Illness:  Katherine Wang is a 71 y.o. very pleasant female patient who presents with the following:  Periodic follow-up visit today- last seen by myself in February of this year  History of skin cancer, hyperlipidemia, RA, HTN  She saw Dr. Estanislado Pandy in July as follows:  Visit Diagnoses: Rheumatoid arthritis involving multiple sites with positive rheumatoid factor ([place]) -  +RF, -CCP, -ANA .  Patient has no active synovitis on examination today. High risk medication use - MTX 0.6 ml sq qwk, Arava 10 mg po qd , folic acid 2mg  po qd. we will check CBC and CMP today and then every 3 months to monitor for drug toxicity Primary osteoarthritis of both hands-she does have severe osteoarthritis in her bilateral hands.  Joint protection muscle strengthening was discussed. Primary osteoarthritis of both knees-she is osteoarthritis in her knee joints which is not causing much discomfort Primary osteoarthritis of both feet-joint protection muscle strengthening and proper fitting shoes were discussed.  She is on vitamin D supplement. History of vitamin D deficiency- History of melanoma - She was treated with Remicade in the past which worked really well for her. But due to history of melanoma she had to come off  the Biologics.  Due for flu- however she is not feeling great today so she will defer  Also tetanus is due- as medicare does not pay she will hold off, she is advised that she is due and should get a booster if any dirty wound should occur  She feels like her RA is bout the same, except her right shoulder is more painful She is taking a lot of advil  She did have  some labs drawn per cardiology one week ago The pharmD is monitoring her BP- she has been running a bit low and may feel lightheaded She will report this to her pharmD and they may want to scale back on her medication BP Readings from Last 3 Encounters:  08/13/18 116/70  08/06/18 112/70  05/23/18 122/80   She is on klonpin 0.5- takes one a day, but occasionally needs BID Wonders if I can write for 60 at a time which is ok Reviewed NCCSR and all looks ok   Katherine Wang reports that overall she is doing well, had a good summer and was able to visit the beach   08/07/2018  1  08/07/2018  Clonazepam 0.5 Mg Tablet  15.00 15 Je Cop  [id number]  Wal ([id number])  0/0 1.00 LME Comm Ins  [place]  07/08/2018  1  07/02/2018  Clonazepam 0.5 Mg Tablet  30.00 30 Je Cop  [id number]  Wal ([id number])  0/0 1.00 LME Comm Ins  [place]  06/05/2018  1  05/08/2018  Clonazepam 0.5 Mg Tablet  30.00 30 Je Cop  [id number]  Wal ([id number])  1/1 1.00 LME Comm Ins  [place]  05/09/2018  1  05/08/2018  Clonazepam 0.5 Mg Tablet  30.00 30 Je Cop  [id number]  Wal ([id number])  0/1 1.00 LME Comm Ins  [place]  04/13/2018  1  02/12/2018  Clonazepam 0.5 Mg Tablet  30.00 15 Je Cop  [id number]  Wal [phone])  2/2 2.00 LME Comm Ins  [place]  03/14/2018  1  02/12/2018  Clonazepam 0.5 Mg Tablet  30.00 15 Je Cop  [id number]  Wal [phone])         Lab Results  Component Value Date   TSH 1.13 06/19/2017    Patient Active Problem List   Diagnosis Date Noted  . Moderate aortic regurgitation 11/02/2017  . Lightheadedness 11/02/2017  . Tachycardia 07/26/2017  . Atypical chest pain 07/26/2017  . High risk medications (not anticoagulants) long-term use 01/16/2017  . Baker's cyst of knee, left 12/19/2016  . Pain in both knees 12/19/2016  . Pain of both elbows 12/19/2016  . Malignant melanoma ([place]) 12/19/2016  . Basal cell carcinoma 12/19/2016  . Chronic pansinusitis 08/15/2016  . Deviated septum 08/15/2016  . Epistaxis 08/15/2016  . Hypertrophy, nasal, turbinate 08/15/2016  . Upper airway cough syndrome  04/25/2016  . Hx of adenomatous colonic polyps 06/21/2015  . Hyperlipidemia 11/15/2014  . Rheumatoid arthritis ([place]) 09/01/2014  . Pericarditis 04/24/2011  . Aortic valve disorder 02/14/2009  . ELECTROCARDIOGRAM, ABNORMAL 02/14/2009  . PERNICIOUS ANEMIA 07/10/2007  . DEPRESSION 07/03/2007  . MALAISE AND FATIGUE 07/03/2007  . HYPOTHYROIDISM 04/30/2007  . Essential hypertension 04/30/2007  . Aneurysm of thoracic aorta ([place]) 02/13/2006    Past Medical History:  Diagnosis Date  . Adenomatous colon polyp 2012  . Allergy   . Anxiety   . Aortic insufficiency   . Arthritis   . Basal cell carcinoma   . Cataract   . Depression   . Diverticulosis   . Fibromyalgia   . Fibromyalgia   . Hyperlipidemia   . Hypertension   . Macular degeneration   . Melanoma ([place])   . Murmur, heart   . Reflux   . Stroke ([place]) 20 years ago   per pt. mild stroke  . Thyroid disease    hypothroidism     Past Surgical History:  Procedure Laterality Date  . ABDOMINAL HYSTERECTOMY    . APPENDECTOMY    . BASAL CELL CARCINOMA EXCISION     nose   . CARDIAC CATHETERIZATION    . CHOLECYSTECTOMY    . COLONOSCOPY     30 + years ago, unsure of where  . EYE SURGERY    . LASIK Bilateral   . MELANOMA EXCISION    . NASAL SEPTUM SURGERY    . TUBAL LIGATION      Social History   Tobacco Use  . Smoking status: Former Smoker    Packs/day: 2.00    Years: 20.00    Pack years: 40.00    Types: Cigarettes    Last attempt to quit: 10/30/1983    Years since quitting: 34.8  . Smokeless tobacco: Never Used  Substance Use Topics  . Alcohol use: No    Alcohol/week: 0.0 standard drinks  . Drug use: Never    Family History  Problem Relation Age of Onset  . Heart disease Mother   . Hypertension Mother   . Mental illness Mother   . Allergies Mother   . Heart disease Father   . Hypertension Father   . Mental illness Father   . Emphysema Father        smoked  . Allergies Father   . Heart attack Father   .  Hyperlipidemia Sister   . Hypertension Sister   . Allergies Sister   . Hypertension Sister   . Diabetes Sister   . Allergies Sister   . Epilepsy Son   .  Aneurysm Son   . Hyperthyroidism Daughter   . Hypothyroidism Daughter   . Hypertension Daughter   . Colon cancer Neg Hx   . Esophageal cancer Neg Hx   . Rectal cancer Neg Hx   . Stomach cancer Neg Hx   . Pancreatic cancer Neg Hx   . Prostate cancer Neg Hx     Allergies  Allergen Reactions  . Tramadol Other (See Comments)    dizzy  . Statins     Body ache    Medication list has been reviewed and updated.  Current Outpatient Medications on File Prior to Visit  Medication Sig Dispense Refill  . albuterol (PROVENTIL HFA;VENTOLIN HFA) 108 (90 Base) MCG/ACT inhaler Inhale 2 puffs into the lungs every 6 (six) hours as needed for wheezing or shortness of breath. 18 g 2  . amLODipine (NORVASC) 10 MG tablet Take 1 tablet (10 mg total) by mouth daily. At bedtime 30 tablet 5  . aspirin EC 81 MG tablet Take 1 tablet (81 mg total) by mouth daily. 90 tablet 3  . carvedilol (COREG) 3.125 MG tablet Take 1 tablet (3.125 mg total) by mouth 2 (two) times daily with a meal. 60 tablet 6  . clonazePAM (KLONOPIN) 0.5 MG tablet TAKE 1 TABLET BY MOUTH AT BEDTIME.  No refills until office visit, please come in 15 tablet 0  . Cyanocobalamin (VITAMIN B 12 PO) Take 1 tablet by mouth daily.    . cycloSPORINE (RESTASIS) 0.05 % ophthalmic emulsion 1 drop 2 (two) times daily.    Marland Kitchen ezetimibe (ZETIA) 10 MG tablet Take 1 tablet (10 mg total) by mouth daily. 90 tablet 3  . famotidine (PEPCID) 40 MG tablet Take 40 mg by mouth daily.    . folic acid (FOLVITE) 1 MG tablet Take 2 tablets (2 mg total) by mouth daily. 180 tablet 3  . hydrochlorothiazide (HYDRODIURIL) 25 MG tablet Take 1 tablet (25 mg total) by mouth daily. 30 tablet 5  . ibuprofen (ADVIL,MOTRIN) 200 MG tablet Take 600 mg by mouth 2 (two) times daily as needed for mild pain.    Marland Kitchen irbesartan (AVAPRO)  300 MG tablet Take 1 tablet (300 mg total) by mouth daily. 30 tablet 11  . leflunomide (ARAVA) 10 MG tablet TAKE 1 TABLET(10 MG) BY MOUTH DAILY 90 tablet 0  . methotrexate 50 MG/2ML injection ADMINISTER 0.6 ML UNDER THE SKIN 1 TIME EVERY WEEK 8 mL 0  . Multiple Vitamins-Minerals (PRESERVISION AREDS) TABS Take 1 tablet by mouth 2 (two) times daily.    . rosuvastatin (CRESTOR) 10 MG tablet Take 1 tablet (10 mg total) by mouth daily. 30 tablet 11  . SAFETY-LOK TB SYRINGE 27GX.5" 27G X 1/2" 1 ML MISC USE AS DIRECTED 12 each 3  . sertraline (ZOLOFT) 100 MG tablet TAKE 2 TABLETS BY MOUTH EVERY DAY 180 tablet 0  . SYNTHROID 100 MCG tablet TAKE 1 TABLET BY MOUTH DAILY BEFORE BREAKFAST 90 tablet 0   No current facility-administered medications on file prior to visit.     Review of Systems:  As per HPI- otherwise negative.   Physical Examination: Vitals:   08/13/18 1418  BP: 116/70  Pulse: 75  Resp: 16  Temp: 98.1 F (36.7 C)  SpO2: 98%   Vitals:   08/13/18 1418  Weight: 149 lb (67.6 kg)  Height: 4\' 11"  (1.499 m)   Body mass index is 30.09 kg/m. Ideal Body Weight: Weight in (lb) to have BMI = 25: 123.5  GEN: WDWN, NAD, Non-toxic,  A & O x 3, overweight, looks well  HEENT: Atraumatic, Normocephalic. Neck supple. No masses, No LAD. Ears and Nose: No external deformity. CV: RRR, No M/G/R. No JVD. No thrill. No extra heart sounds. PULM: CTA B, no wheezes, crackles, rhonchi. No retractions. No resp. distress. No accessory muscle use. ABD: S, NT, ND EXTR: No c/c/e NEURO Normal gait.  PSYCH: Normally interactive. Conversant. Not depressed or anxious appearing.  Calm demeanor.    Assessment and Plan: Vitamin D deficiency - Plan: Vitamin D (25 hydroxy)  Hypothyroidism due to acquired atrophy of thyroid - Plan: TSH  Mixed hyperlipidemia  Screening for diabetes mellitus - Plan: Hemoglobin A1c  Anxiety - Plan: clonazePAM (KLONOPIN) 0.5 MG tablet  Elevated glucose - Plan: Hemoglobin  A1c  Labs as above to check her thyroid, vit and A1c Refilled her klonopin Encouraged flu shot once she feels better She will discuss her BP with cardiology PharmD  Signed Lamar Blinks, MD  Received her labs 10/18- message to pt  Results for orders placed or performed in visit on 08/13/18  TSH  Result Value Ref Range   TSH 5.32 (H) 0.35 - 4.50 uIU/mL  Hemoglobin A1c  Result Value Ref Range   Hgb A1c MFr Bld 5.9 4.6 - 6.5 %  Vitamin D (25 hydroxy)  Result Value Ref Range   VITD 25.77 (L) 30.00 - 100.00 ng/mL

## 2018-08-13 ENCOUNTER — Ambulatory Visit (INDEPENDENT_AMBULATORY_CARE_PROVIDER_SITE_OTHER): Payer: Medicare Other | Admitting: Family Medicine

## 2018-08-13 ENCOUNTER — Encounter: Payer: Self-pay | Admitting: Family Medicine

## 2018-08-13 VITALS — BP 116/70 | HR 75 | Temp 98.1°F | Resp 16 | Ht 59.0 in | Wt 149.0 lb

## 2018-08-13 DIAGNOSIS — Z131 Encounter for screening for diabetes mellitus: Secondary | ICD-10-CM

## 2018-08-13 DIAGNOSIS — F419 Anxiety disorder, unspecified: Secondary | ICD-10-CM

## 2018-08-13 DIAGNOSIS — R7309 Other abnormal glucose: Secondary | ICD-10-CM

## 2018-08-13 DIAGNOSIS — E034 Atrophy of thyroid (acquired): Secondary | ICD-10-CM

## 2018-08-13 DIAGNOSIS — E559 Vitamin D deficiency, unspecified: Secondary | ICD-10-CM

## 2018-08-13 DIAGNOSIS — E782 Mixed hyperlipidemia: Secondary | ICD-10-CM | POA: Diagnosis not present

## 2018-08-13 MED ORDER — CLONAZEPAM 0.5 MG PO TABS: ORAL_TABLET | ORAL | 2 refills | Status: DC

## 2018-08-13 NOTE — Patient Instructions (Addendum)
It was good to see you today-  I will be in touch with your labs asap I gave you an rx for 60 klonopin for you to use as needed- up to twice daily on occasion  You are due for a flu shot- please get soon! Also if you have any wound you would want to get a tetanus shot Let's recheck in 6 months

## 2018-08-14 ENCOUNTER — Telehealth (HOSPITAL_COMMUNITY): Payer: Self-pay | Admitting: Pharmacist

## 2018-08-14 LAB — TSH: TSH: 5.32 u[IU]/mL — AB (ref 0.35–4.50)

## 2018-08-14 LAB — VITAMIN D 25 HYDROXY (VIT D DEFICIENCY, FRACTURES): VITD: 25.77 ng/mL — AB (ref 30.00–100.00)

## 2018-08-14 LAB — HEMOGLOBIN A1C: HEMOGLOBIN A1C: 5.9 % (ref 4.6–6.5)

## 2018-08-14 MED ORDER — HYDROCHLOROTHIAZIDE 12.5 MG PO TABS: 12.5000 mg | ORAL_TABLET | Freq: Every day | ORAL | 5 refills | Status: DC

## 2018-08-14 NOTE — Telephone Encounter (Signed)
Katherine Wang states that she continues to be dizzy and it is starting to get worse. He BP at her PCP office yesterday was 116/70 mmHg. I have advised her to decrease her hctz to 12.5 mg daily to see if this improves her dizziness.   Ruta Hinds. Velva Harman, PharmD, BCPS, CPP Clinical Pharmacist Phone: [phone] 08/14/2018 3:56 PM

## 2018-08-15 ENCOUNTER — Encounter: Payer: Self-pay | Admitting: Family Medicine

## 2018-08-15 MED ORDER — LEVOTHYROXINE SODIUM 112 MCG PO TABS: 112.0000 ug | ORAL_TABLET | Freq: Every day | ORAL | 6 refills | Status: DC

## 2018-08-15 NOTE — Addendum Note (Signed)
Addended by: Lamar Blinks C on: 08/15/2018 02:56 PM   Modules accepted: Orders

## 2018-08-18 ENCOUNTER — Telehealth: Payer: Self-pay | Admitting: Pharmacist

## 2018-08-18 NOTE — Telephone Encounter (Signed)
Spoke with patient about missed lab appt to recheck cholesterol. She states that she is driving now and unable to make appt, but requests that I call back later today or tomorrow to set up appt for recheck.

## 2018-08-19 ENCOUNTER — Other Ambulatory Visit: Payer: Self-pay | Admitting: Family Medicine

## 2018-08-19 DIAGNOSIS — R058 Other specified cough: Secondary | ICD-10-CM

## 2018-08-19 DIAGNOSIS — R05 Cough: Secondary | ICD-10-CM

## 2018-08-20 ENCOUNTER — Telehealth: Payer: Self-pay | Admitting: Family Medicine

## 2018-08-20 NOTE — Telephone Encounter (Signed)
Requesting:Clonazepam Contract:none, needs csc TVT:WYSO, needs uds Last Visit:08/13/2018 Next Visit:02/12/2019 Last Refill:08/13/18  Refill request too early. Refilled on 08/13/18  Please Advise

## 2018-08-20 NOTE — Telephone Encounter (Signed)
Copied from [place] [id number]. Topic: Quick Communication - See Telephone Encounter >> Aug 20, 2018  9:23 AM Bea Graff, NT wrote: CRM for notification. See Telephone encounter for: 08/20/18. Pt would like a call with her 08/13/18 lab results, and to discuss why her synthroid medication was increased.

## 2018-08-20 NOTE — Telephone Encounter (Signed)
Lab results sent to patient's mychart. Patient did not log on to mychart to see note from pcp. Read out her results to her and explained to her why the medication was increased to 157mcg. She verbalized understanding.

## 2018-08-25 NOTE — Telephone Encounter (Signed)
LMOM to follow up reschedule of lipid/hepatic panel since missed previous appt.

## 2018-09-15 NOTE — Telephone Encounter (Signed)
LMOM to follow up 

## 2018-09-18 NOTE — Telephone Encounter (Signed)
Spoke with patient. She will see if labs can be drawn with labs for PCP on Monday. She will call back if unable to draw these on that day. Will call her to adjust medication therapy once resulted.

## 2018-09-22 ENCOUNTER — Ambulatory Visit (INDEPENDENT_AMBULATORY_CARE_PROVIDER_SITE_OTHER): Payer: Medicare Other

## 2018-09-22 ENCOUNTER — Other Ambulatory Visit: Payer: Self-pay | Admitting: Family Medicine

## 2018-09-22 ENCOUNTER — Other Ambulatory Visit (INDEPENDENT_AMBULATORY_CARE_PROVIDER_SITE_OTHER): Payer: Medicare Other

## 2018-09-22 ENCOUNTER — Encounter: Payer: Self-pay | Admitting: Family Medicine

## 2018-09-22 DIAGNOSIS — Z23 Encounter for immunization: Secondary | ICD-10-CM

## 2018-09-22 DIAGNOSIS — R05 Cough: Secondary | ICD-10-CM

## 2018-09-22 DIAGNOSIS — E034 Atrophy of thyroid (acquired): Secondary | ICD-10-CM

## 2018-09-22 DIAGNOSIS — R058 Other specified cough: Secondary | ICD-10-CM

## 2018-09-22 LAB — TSH: TSH: 1.57 u[IU]/mL (ref 0.35–4.50)

## 2018-09-22 NOTE — Progress Notes (Signed)
Patient came in today for flu vaccine. Per Dr. Lorelei Pont Patient tolerated vaccine in her left deltoid with no complications.

## 2018-09-29 ENCOUNTER — Telehealth: Payer: Self-pay | Admitting: Rheumatology

## 2018-09-29 MED ORDER — LEFLUNOMIDE 10 MG PO TABS: ORAL_TABLET | ORAL | 0 refills | Status: DC

## 2018-09-29 NOTE — Telephone Encounter (Signed)
Patient request refill on Leflunomide sent to [facility] on [address]. Patient has been without since before Thanksgiving.

## 2018-09-29 NOTE — Telephone Encounter (Signed)
Last Visit: 05/23/18 Next Visit: 11/03/18 Labs: 7/26/19WBC count stable. Glucose is 103. All other labs are WNL.  Patient advised she is due for labs and will update 09/30/18   Okay to refill 30 day supply per Dr. Estanislado Pandy

## 2018-10-01 ENCOUNTER — Other Ambulatory Visit: Payer: Self-pay | Admitting: *Deleted

## 2018-10-01 DIAGNOSIS — M0579 Rheumatoid arthritis with rheumatoid factor of multiple sites without organ or systems involvement: Secondary | ICD-10-CM | POA: Diagnosis not present

## 2018-10-02 LAB — COMPLETE METABOLIC PANEL WITH GFR
AG RATIO: 1.6 (calc) (ref 1.0–2.5)
ALT: 17 U/L (ref 6–29)
AST: 25 U/L (ref 10–35)
Albumin: 3.9 g/dL (ref 3.6–5.1)
Alkaline phosphatase (APISO): 77 U/L (ref 33–130)
BILIRUBIN TOTAL: 0.6 mg/dL (ref 0.2–1.2)
BUN: 15 mg/dL (ref 7–25)
CO2: 29 mmol/L (ref 20–32)
Calcium: 9.4 mg/dL (ref 8.6–10.4)
Chloride: 101 mmol/L (ref 98–110)
Creat: 0.7 mg/dL (ref 0.60–0.93)
GFR, EST AFRICAN AMERICAN: 99 mL/min/{1.73_m2} (ref >=60)
GFR, Est Non African American: 85 mL/min/{1.73_m2} (ref >=60)
Globulin: 2.5 g/dL (calc) (ref 1.9–3.7)
Glucose, Bld: 115 mg/dL — ABNORMAL HIGH (ref 65–99)
POTASSIUM: 3.9 mmol/L (ref 3.5–5.3)
SODIUM: 138 mmol/L (ref 135–146)
TOTAL PROTEIN: 6.4 g/dL (ref 6.1–8.1)

## 2018-10-02 LAB — CBC WITH DIFFERENTIAL/PLATELET
BASOS PCT: 0.9 %
Basophils Absolute: 29 cells/uL (ref 0–200)
EOS PCT: 4.4 %
Eosinophils Absolute: 141 cells/uL (ref 15–500)
HCT: 33.5 % — ABNORMAL LOW (ref 35.0–45.0)
Hemoglobin: 11.4 g/dL — ABNORMAL LOW (ref 11.7–15.5)
Lymphs Abs: 1082 cells/uL (ref 850–3900)
MCH: 31.8 pg (ref 27.0–33.0)
MCHC: 34 g/dL (ref 32.0–36.0)
MCV: 93.3 fL (ref 80.0–100.0)
MONOS PCT: 5.7 %
MPV: 10 fL (ref 7.5–12.5)
NEUTROS PCT: 55.2 %
Neutro Abs: 1766 cells/uL (ref 1500–7800)
PLATELETS: 246 10*3/uL (ref 140–400)
RBC: 3.59 10*6/uL — AB (ref 3.80–5.10)
RDW: 15.6 % — ABNORMAL HIGH (ref 11.0–15.0)
Total Lymphocyte: 33.8 %
WBC mixed population: 182 cells/uL — ABNORMAL LOW (ref 200–950)
WBC: 3.2 10*3/uL — AB (ref 3.8–10.8)

## 2018-10-02 NOTE — Progress Notes (Signed)
And drop in WBC count noted.  Please have patient reduce methotrexate dose to 0.4 mL subcu weekly.  We will continue to monitor labs.  She was clinically doing well during the last visit.

## 2018-10-07 ENCOUNTER — Telehealth: Payer: Self-pay | Admitting: Rheumatology

## 2018-10-07 MED ORDER — METHOTREXATE SODIUM CHEMO INJECTION 50 MG/2ML: 10.0000 mg | INTRAMUSCULAR | 0 refills | Status: DC

## 2018-10-07 NOTE — Telephone Encounter (Signed)
Last Visit: 05/23/18 Next Visit: 11/03/18 Labs: 10/01/18 drop in WBC count noted reduce methotrexate dose to 0.4 mL subcu weekly  Okay to refill per Dr. Estanislado Pandy

## 2018-10-07 NOTE — Telephone Encounter (Signed)
Patient called requesting prescription refill of Methotrexate to be sent to [facility] on [address] in [place].  Patient states she is due to take an injection tomorrow and is out of medication.

## 2018-10-13 ENCOUNTER — Telehealth: Payer: Self-pay | Admitting: Pharmacist

## 2018-10-13 NOTE — Telephone Encounter (Signed)
Called patient to follow up on cholesterol panel as has not been drawn. She states that she stopped her ezetimibe a month or so ago after she was told she had low blood counts. Advised that this should be unrelated. Will check cholesterol off therapy and see if need to add ezetimibe back.   She states understanding and will come for cholesterol panel tomorrow.

## 2018-10-14 ENCOUNTER — Other Ambulatory Visit: Payer: Medicare Other

## 2018-10-14 DIAGNOSIS — E785 Hyperlipidemia, unspecified: Secondary | ICD-10-CM | POA: Diagnosis not present

## 2018-10-14 LAB — HEPATIC FUNCTION PANEL
ALBUMIN: 4 g/dL (ref 3.5–4.8)
ALT: 15 IU/L (ref 0–32)
AST: 25 IU/L (ref 0–40)
Alkaline Phosphatase: 80 IU/L (ref 39–117)
BILIRUBIN TOTAL: 0.5 mg/dL (ref 0.0–1.2)
BILIRUBIN, DIRECT: 0.17 mg/dL (ref 0.00–0.40)
Total Protein: 6.5 g/dL (ref 6.0–8.5)

## 2018-10-14 LAB — LIPID PANEL
CHOL/HDL RATIO: 3.3 ratio (ref 0.0–4.4)
CHOLESTEROL TOTAL: 139 mg/dL (ref 100–199)
HDL: 42 mg/dL (ref >39)
LDL CALC: 79 mg/dL (ref 0–99)
Triglycerides: 89 mg/dL (ref 0–149)
VLDL Cholesterol Cal: 18 mg/dL (ref 5–40)

## 2018-10-20 NOTE — Progress Notes (Signed)
Office Visit Note  Patient: Katherine Wang             Date of Birth: Jan 20, 1946           MRN: [id number]             PCP: [name], MD Referring: [name], MD Visit Date: 11/03/2018 Occupation: @GUAROCC @  Subjective:  Right shoulder pain.   History of Present Illness: Katherine Wang is a 72 y.o. female with history of rheumatoid arthritis and osteoarthritis overlap.  She states she has been having pain and discomfort in her right shoulder for a month now.  She has difficulty raising her right arm.  She has been also experiencing some muscle spasms in her bilateral upper extremities.  He has noticed some puffiness in her knee joints which she describes in the popliteal region.  Had to reduce the methotrexate dose to 4 mL subcu weekly's due to decrease in her white cell count.  Activities of Daily Living:  Patient reports morning stiffness for 0 minutes.   Patient Reports nocturnal pain.  Difficulty dressing/grooming: Reports Difficulty climbing stairs: Reports Difficulty getting out of chair: Denies Difficulty using hands for taps, buttons, cutlery, and/or writing: Reports  Review of Systems  Constitutional: Negative for fatigue, night sweats, weight gain and weight loss.  HENT: Negative for mouth sores, trouble swallowing, trouble swallowing, mouth dryness and nose dryness.   Eyes: Negative for pain, redness, itching, visual disturbance and dryness.  Respiratory: Negative for cough, shortness of breath, wheezing and difficulty breathing.   Cardiovascular: Negative for chest pain, palpitations, hypertension, irregular heartbeat and swelling in legs/feet.  Gastrointestinal: Positive for constipation. Negative for abdominal pain, blood in stool, diarrhea, nausea and vomiting.  Endocrine: Positive for increased urination.  Genitourinary: Negative for painful urination, nocturia, pelvic pain and vaginal dryness.  Musculoskeletal: Positive for arthralgias, joint pain  and joint swelling. Negative for myalgias, muscle weakness, morning stiffness, muscle tenderness and myalgias.  Skin: Negative for color change, rash, hair loss, skin tightness, ulcers and sensitivity to sunlight.  Allergic/Immunologic: Negative for susceptible to infections.  Neurological: Positive for weakness. Negative for dizziness, light-headedness, headaches, memory loss and night sweats.  Hematological: Negative for bruising/bleeding tendency and swollen glands.  Psychiatric/Behavioral: Negative for depressed mood, confusion and sleep disturbance. The patient is not nervous/anxious.     PMFS History:  Patient Active Problem List   Diagnosis Date Noted  . Moderate aortic regurgitation 11/02/2017  . Lightheadedness 11/02/2017  . Tachycardia 07/26/2017  . Atypical chest pain 07/26/2017  . High risk medications (not anticoagulants) long-term use 01/16/2017  . Baker's cyst of knee, left 12/19/2016  . Pain in both knees 12/19/2016  . Pain of both elbows 12/19/2016  . Malignant melanoma ([place]) 12/19/2016  . Basal cell carcinoma 12/19/2016  . Chronic pansinusitis 08/15/2016  . Deviated septum 08/15/2016  . Epistaxis 08/15/2016  . Hypertrophy, nasal, turbinate 08/15/2016  . Upper airway cough syndrome 04/25/2016  . Hx of adenomatous colonic polyps 06/21/2015  . Hyperlipidemia 11/15/2014  . Rheumatoid arthritis ([place]) 09/01/2014  . Pericarditis 04/24/2011  . Aortic valve disorder 02/14/2009  . ELECTROCARDIOGRAM, ABNORMAL 02/14/2009  . PERNICIOUS ANEMIA 07/10/2007  . DEPRESSION 07/03/2007  . MALAISE AND FATIGUE 07/03/2007  . HYPOTHYROIDISM 04/30/2007  . Essential hypertension 04/30/2007  . Aneurysm of thoracic aorta ([place]) 02/13/2006    Past Medical History:  Diagnosis Date  . Adenomatous colon polyp 2012  . Allergy   . Anxiety   . Aortic insufficiency   .  Arthritis   . Basal cell carcinoma   . Cataract   . Depression   . Diverticulosis   . Fibromyalgia   . Fibromyalgia     . Hyperlipidemia   . Hypertension   . Macular degeneration   . Melanoma ([place])   . Murmur, heart   . Reflux   . Stroke ([place]) 20 years ago   per pt. mild stroke  . Thyroid disease    hypothroidism     Family History  Problem Relation Age of Onset  . Heart disease Mother   . Hypertension Mother   . Mental illness Mother   . Allergies Mother   . Heart disease Father   . Hypertension Father   . Mental illness Father   . Emphysema Father        smoked  . Allergies Father   . Heart attack Father   . Hyperlipidemia Sister   . Hypertension Sister   . Allergies Sister   . Hypertension Sister   . Diabetes Sister   . Allergies Sister   . Epilepsy Son   . Aneurysm Son   . Hyperthyroidism Daughter   . Hypothyroidism Daughter   . Hypertension Daughter   . Colon cancer Neg Hx   . Esophageal cancer Neg Hx   . Rectal cancer Neg Hx   . Stomach cancer Neg Hx   . Pancreatic cancer Neg Hx   . Prostate cancer Neg Hx    Past Surgical History:  Procedure Laterality Date  . ABDOMINAL HYSTERECTOMY    . APPENDECTOMY    . BASAL CELL CARCINOMA EXCISION     nose   . CARDIAC CATHETERIZATION    . CHOLECYSTECTOMY    . COLONOSCOPY     30 + years ago, unsure of where  . EYE SURGERY    . LASIK Bilateral   . MELANOMA EXCISION    . NASAL SEPTUM SURGERY    . TUBAL LIGATION     Social History   Social History Narrative   Lives alone in a one story home.  Has 2 living children.  Retired Engineer, water.     Immunization History  Administered Date(s) Administered  . Influenza, High Dose Seasonal PF 08/16/2016, 12/12/2017, 09/22/2018  . Pneumococcal Conjugate-13 09/01/2014  . Pneumococcal Polysaccharide-23 12/12/2017  . Td 11/29/2006    Objective: Vital Signs: BP 140/84 (BP Location: Left Arm, Patient Position: Sitting, Cuff Size: Normal)   Pulse 82   Resp 13   Ht 4\' 11"  (1.499 m)   Wt 149 lb (67.6 kg)   BMI 30.09 kg/m    Physical Exam Vitals signs and nursing note reviewed.   Constitutional:      Appearance: She is well-developed.  HENT:     Head: Normocephalic and atraumatic.  Eyes:     Conjunctiva/sclera: Conjunctivae normal.  Neck:     Musculoskeletal: Normal range of motion.  Cardiovascular:     Rate and Rhythm: Normal rate and regular rhythm.     Heart sounds: Normal heart sounds.  Pulmonary:     Effort: Pulmonary effort is normal.     Breath sounds: Normal breath sounds.  Abdominal:     General: Bowel sounds are normal.     Palpations: Abdomen is soft.  Lymphadenopathy:     Cervical: No cervical adenopathy.  Skin:    General: Skin is warm and dry.     Capillary Refill: Capillary refill takes less than 2 seconds.  Neurological:     Mental Status: She is  alert and oriented to person, place, and time.  Psychiatric:        Behavior: Behavior normal.      Musculoskeletal Exam: C-spine thoracic lumbar spine good range of motion.  She has thoracic kyphosis and lumbar scoliosis.  She had painful range of motion of right shoulder joint with limited abduction.  Elbow joints and wrist joints with good range of motion.  She has some tenderness over second and third MCP joints.  Hip joints and knee joints were in good range of motion.  She has tenderness across MTP joints but no synovitis was noted.  CDAI Exam: CDAI Score: 2.5  Patient Global Assessment: 3 (mm); Provider Global Assessment: 2 (mm) Swollen: 0 ; Tender: 3  Joint Exam      Right  Left  Glenohumeral   Tender     MCP 2   Tender     MTP 3   Tender        Investigation: No additional findings.  Imaging: Xr Foot 2 Views Left  Result Date: 11/03/2018 First MTP, all PIP and DIP joint space narrowing was noted.  No erosive changes were noted.  Juxta-articular articular osteopenia was noted.  No intertarsal or tibiotalar joint space narrowing was noted. Impression: These findings are consistent with rheumatoid arthritis and osteoarthritis overlap.  Xr Foot 2 Views Right  Result Date:  11/03/2018 First MTP severe narrowing and subluxation was noted.  PIP and DIP narrowing was noted.  None of the other MTPs showed narrowing or erosive changes.  No intertarsal joint space narrowing was noted.  Juxta-articular osteopenia was noted. Impression: These findings are consistent with rheumatoid arthritis and osteoarthritis overlap.  Xr Hand 2 View Left  Result Date: 11/03/2018 Juxta-articular osteopenia was noted.  Second and third MCP joint space narrowing was noted.  CMC joint subluxation and narrowing was noted.  PIP and DIP narrowing was noted.  No significant intercarpal radiocarpal joint space narrowing was noted.  No erosive changes were noted. Impression: These findings are consistent with rheumatoid arthritis and osteoarthritis overlap.  Xr Hand 2 View Right  Result Date: 11/03/2018 Severe narrowing of second and third MCP joints was noted.  Juxta-articular osteopenia was noted.  PIP and DIP narrowing was noted.  CMC joint severe narrowing was noted.  No intercarpal radiocarpal joint space narrowing was noted.  No erosive changes were noted. Impression: These findings are consistent with severe rheumatoid arthritis and osteoarthritis overlap.  Xr Shoulder Right  Result Date: 11/03/2018 High rising humerus and mild glenohumeral joint space narrowing was noted.  No subacromial joint space narrowing was noted.   Recent Labs: Lab Results  Component Value Date   WBC 3.2 (L) 10/01/2018   HGB 11.4 (L) 10/01/2018   PLT 246 10/01/2018   NA 138 10/01/2018   K 3.9 10/01/2018   CL 101 10/01/2018   CO2 29 10/01/2018   GLUCOSE 115 (H) 10/01/2018   BUN 15 10/01/2018   CREATININE 0.70 10/01/2018   BILITOT 0.5 10/14/2018   ALKPHOS 80 10/14/2018   AST 25 10/14/2018   ALT 15 10/14/2018   PROT 6.5 10/14/2018   ALBUMIN 4.0 10/14/2018   CALCIUM 9.4 10/01/2018   GFRAA 99 10/01/2018    Speciality Comments: No specialty comments available.  Procedures:  Large Joint Inj: R subacromial  bursa on 11/03/2018 11:40 AM Indications: pain Details: 27 G 1.5 in needle, posterior approach  Arthrogram: No  Medications: 1 mL lidocaine 1 %; 40 mg triamcinolone acetonide 40 MG/ML Aspirate: 0 mL  Outcome: tolerated well, no immediate complications Procedure, treatment alternatives, risks and benefits explained, specific risks discussed. Consent was given by the patient. Immediately prior to procedure a time out was called to verify the correct patient, procedure, equipment, support staff and site/side marked as required. Patient was prepped and draped in the usual sterile fashion.     Allergies: Tramadol and Statins   Assessment / Plan:     Visit Diagnoses: Rheumatoid arthritis involving multiple sites with positive rheumatoid factor (HCC) - +RF, -CCP, -ANA -.  Patient has been experiencing increased joint pain on reduced dose of methotrexate.  I do not see any synovitis but she had tenderness in some of her joints as described above.  Due to patient's history of melanoma the choices are very limited.  She is on combination of methotrexate and Arava.  She could not take Plaquenil due to macular degeneration.  She is not interested in Rituxan.  Plan: Sedimentation rate  High risk medication use - MTX 0.4 ml sq qwk, Arava 10 mg po qd , folic acid 2mg  po qd - Plan: CBC with Differential/Platelet.  She has developed neutropenia on methotrexate but is been stable.  I will check her CBC today. Patient received annual flu and both pneumonia vaccines. Recommend Shingrix vaccine as indicated  Acute pain of right shoulder -she had tenderness on palpation over right subacromial bursa.  She is also difficulty raising her right arm.  Plan: XR Shoulder Right.  The x-ray showed high rising humerus and some glenohumeral joint space narrowing.  She was having discomfort in the subacromial bursa which was injected after informed consent was obtained.  If she has persistent pain she will notify us and we may have  to proceed with MRI of the shoulder joint.  Pain in both hands -she has been experiencing pain in her bilateral hands.  I will obtain x-rays to follow-up on her rheumatoid arthritis.  Plan: XR Hand 2 View Right, XR Hand 2 View Left.  The x-rays were consistent with rheumatoid arthritis and osteoarthritis overlap.  Radiographic progression was noted with severe narrowing of the second and third MCP joint space.  Pain in both feet -she has been experiencing pain in her bilateral feet.  She also has underlying osteoarthritis.  Plan: XR Foot 2 Views Right, XR Foot 2 Views Left.  No radiographic progression in her feet x-rays were noted and compared to the x-rays of 2011.  Progression of osteoarthritis was noted.  Primary osteoarthritis of both hands  Primary osteoarthritis of both knees  Primary osteoarthritis of both feet  History of melanoma - She was treated with Remicade in the past which worked really well for her. But due to history of melanoma she had to come off  the Biologics.  History of vitamin D deficiency  History of hypothyroidism  History of macular degeneration  History of cholecystectomy  History of depression  History of anxiety  History of hypercholesterolemia  Aortic valve disorder  History of anemia  History of neuropathy  History of hypertension  History of basal cell carcinoma  History of asthma   Orders: Orders Placed This Encounter  Procedures  . XR Shoulder Right  . XR Hand 2 View Right  . XR Hand 2 View Left  . XR Foot 2 Views Right  . XR Foot 2 Views Left  . CBC with Differential/Platelet  . Sedimentation rate   No orders of the defined types were placed in this encounter.   Face-to-face time spent with  patient was 30 minutes. Greater than 50% of time was spent in counseling and coordination of care.  Follow-Up Instructions: Return in about 5 months (around 04/04/2019) for Rheumatoid arthritis, Osteoarthritis.   Bo Merino,  MD  Note - This record has been created using Editor, commissioning.  Chart creation errors have been sought, but may not always  have been located. Such creation errors do not reflect on  the standard of medical care.

## 2018-10-27 ENCOUNTER — Other Ambulatory Visit: Payer: Self-pay | Admitting: Rheumatology

## 2018-10-27 NOTE — Telephone Encounter (Signed)
Last Visit: 05/23/18 Next Visit: 11/03/18 Labs: 10/01/18 drop in WBC count noted  Okay to refill per Dr. Estanislado Pandy

## 2018-11-03 ENCOUNTER — Ambulatory Visit (INDEPENDENT_AMBULATORY_CARE_PROVIDER_SITE_OTHER): Payer: Medicare Other

## 2018-11-03 ENCOUNTER — Ambulatory Visit (INDEPENDENT_AMBULATORY_CARE_PROVIDER_SITE_OTHER): Payer: Self-pay

## 2018-11-03 ENCOUNTER — Ambulatory Visit (INDEPENDENT_AMBULATORY_CARE_PROVIDER_SITE_OTHER): Payer: Medicare Other | Admitting: Rheumatology

## 2018-11-03 ENCOUNTER — Encounter: Payer: Self-pay | Admitting: Rheumatology

## 2018-11-03 VITALS — BP 140/84 | HR 82 | Resp 13 | Ht 59.0 in | Wt 149.0 lb

## 2018-11-03 DIAGNOSIS — Z79899 Other long term (current) drug therapy: Secondary | ICD-10-CM | POA: Diagnosis not present

## 2018-11-03 DIAGNOSIS — Z8669 Personal history of other diseases of the nervous system and sense organs: Secondary | ICD-10-CM | POA: Diagnosis not present

## 2018-11-03 DIAGNOSIS — I359 Nonrheumatic aortic valve disorder, unspecified: Secondary | ICD-10-CM | POA: Diagnosis not present

## 2018-11-03 DIAGNOSIS — Z85820 Personal history of malignant melanoma of skin: Secondary | ICD-10-CM

## 2018-11-03 DIAGNOSIS — M0579 Rheumatoid arthritis with rheumatoid factor of multiple sites without organ or systems involvement: Secondary | ICD-10-CM

## 2018-11-03 DIAGNOSIS — M79641 Pain in right hand: Secondary | ICD-10-CM

## 2018-11-03 DIAGNOSIS — M19041 Primary osteoarthritis, right hand: Secondary | ICD-10-CM

## 2018-11-03 DIAGNOSIS — M79672 Pain in left foot: Secondary | ICD-10-CM

## 2018-11-03 DIAGNOSIS — Z8709 Personal history of other diseases of the respiratory system: Secondary | ICD-10-CM

## 2018-11-03 DIAGNOSIS — M25511 Pain in right shoulder: Secondary | ICD-10-CM

## 2018-11-03 DIAGNOSIS — Z8679 Personal history of other diseases of the circulatory system: Secondary | ICD-10-CM

## 2018-11-03 DIAGNOSIS — Z85828 Personal history of other malignant neoplasm of skin: Secondary | ICD-10-CM

## 2018-11-03 DIAGNOSIS — Z8639 Personal history of other endocrine, nutritional and metabolic disease: Secondary | ICD-10-CM | POA: Diagnosis not present

## 2018-11-03 DIAGNOSIS — M79642 Pain in left hand: Secondary | ICD-10-CM

## 2018-11-03 DIAGNOSIS — Z8659 Personal history of other mental and behavioral disorders: Secondary | ICD-10-CM | POA: Diagnosis not present

## 2018-11-03 DIAGNOSIS — M79671 Pain in right foot: Secondary | ICD-10-CM | POA: Diagnosis not present

## 2018-11-03 DIAGNOSIS — M170 Bilateral primary osteoarthritis of knee: Secondary | ICD-10-CM

## 2018-11-03 DIAGNOSIS — M19071 Primary osteoarthritis, right ankle and foot: Secondary | ICD-10-CM

## 2018-11-03 DIAGNOSIS — M19042 Primary osteoarthritis, left hand: Secondary | ICD-10-CM

## 2018-11-03 DIAGNOSIS — M19072 Primary osteoarthritis, left ankle and foot: Secondary | ICD-10-CM

## 2018-11-03 LAB — CBC WITH DIFFERENTIAL/PLATELET
ABSOLUTE MONOCYTES: 170 {cells}/uL — AB (ref 200–950)
BASOS PCT: 0.9 %
Basophils Absolute: 31 cells/uL (ref 0–200)
EOS PCT: 2.1 %
Eosinophils Absolute: 71 cells/uL (ref 15–500)
HEMATOCRIT: 36.4 % (ref 35.0–45.0)
HEMOGLOBIN: 12.2 g/dL (ref 11.7–15.5)
LYMPHS ABS: 969 {cells}/uL (ref 850–3900)
MCH: 31.8 pg (ref 27.0–33.0)
MCHC: 33.5 g/dL (ref 32.0–36.0)
MCV: 94.8 fL (ref 80.0–100.0)
MPV: 9.7 fL (ref 7.5–12.5)
Monocytes Relative: 5 %
NEUTROS ABS: 2159 {cells}/uL (ref 1500–7800)
NEUTROS PCT: 63.5 %
Platelets: 275 10*3/uL (ref 140–400)
RBC: 3.84 10*6/uL (ref 3.80–5.10)
RDW: 14.9 % (ref 11.0–15.0)
Total Lymphocyte: 28.5 %
WBC: 3.4 10*3/uL — AB (ref 3.8–10.8)

## 2018-11-03 LAB — SEDIMENTATION RATE: Sed Rate: 17 mm/h (ref 0–30)

## 2018-11-03 MED ORDER — LIDOCAINE HCL 1 % IJ SOLN
1.0000 mL | INTRAMUSCULAR | Status: AC | PRN
  Administered 2018-11-03: 1 mL

## 2018-11-03 MED ORDER — TRIAMCINOLONE ACETONIDE 40 MG/ML IJ SUSP
40.0000 mg | INTRAMUSCULAR | Status: AC | PRN
  Administered 2018-11-03: 40 mg via INTRA_ARTICULAR

## 2018-11-04 NOTE — Progress Notes (Signed)
Neutropenia noted, which is a stable.  We will continue to monitor labs.

## 2018-11-26 ENCOUNTER — Telehealth: Payer: Self-pay | Admitting: Pharmacist

## 2018-11-26 MED ORDER — ROSUVASTATIN CALCIUM 10 MG PO TABS: ORAL_TABLET | ORAL | 11 refills | Status: DC

## 2018-11-26 NOTE — Telephone Encounter (Signed)
New Message     Patient returning your phone call about Cholesterol results.

## 2018-11-26 NOTE — Telephone Encounter (Signed)
Spoke with patient. Reviewed lipid options - most recent LDL was 79, but this was just after stopping ezetimibe therapy. She does not wish to get back on this therapy due to cost. She is currently tolerating her rosuvastatin 10mg  daily well. Discussed trying 15mg  daily and she is hesitant. Thus we will try 15mg  on M, W,F and 10mg  all other days. She will call with any issues. Will plan to recheck cholesterol panel in 3 months if tolerating.

## 2018-12-08 ENCOUNTER — Encounter: Payer: Self-pay | Admitting: Rheumatology

## 2018-12-08 ENCOUNTER — Ambulatory Visit (INDEPENDENT_AMBULATORY_CARE_PROVIDER_SITE_OTHER): Payer: Medicare Other | Admitting: Rheumatology

## 2018-12-08 VITALS — BP 128/82 | HR 85 | Resp 16 | Ht 59.0 in | Wt 148.0 lb

## 2018-12-08 DIAGNOSIS — M25561 Pain in right knee: Secondary | ICD-10-CM | POA: Diagnosis not present

## 2018-12-08 DIAGNOSIS — M0579 Rheumatoid arthritis with rheumatoid factor of multiple sites without organ or systems involvement: Secondary | ICD-10-CM

## 2018-12-08 DIAGNOSIS — Z85820 Personal history of malignant melanoma of skin: Secondary | ICD-10-CM

## 2018-12-08 DIAGNOSIS — I10 Essential (primary) hypertension: Secondary | ICD-10-CM

## 2018-12-08 DIAGNOSIS — Z79899 Other long term (current) drug therapy: Secondary | ICD-10-CM | POA: Diagnosis not present

## 2018-12-08 MED ORDER — LIDOCAINE HCL 1 % IJ SOLN
1.5000 mL | INTRAMUSCULAR | Status: AC | PRN
  Administered 2018-12-08: 1.5 mL

## 2018-12-08 MED ORDER — TRIAMCINOLONE ACETONIDE 40 MG/ML IJ SUSP
40.0000 mg | INTRAMUSCULAR | Status: AC | PRN
  Administered 2018-12-08: 40 mg via INTRA_ARTICULAR

## 2018-12-08 NOTE — Progress Notes (Signed)
Office Visit Note  Patient: Katherine Wang             Date of Birth: 1946-07-08           MRN: [id number]             PCP: [name], MD Referring: [name], MD Visit Date: 12/08/2018 Occupation: @GUAROCC @  Subjective:  Right knee pain   History of Present Illness: Katherine Wang is a 73 y.o. female with history of rheumatoid arthritis and osteoarthritis.  She states she has been having progressively increased pain in her right knee joint.  She denies any history of injury.  She states when she drives her right knee joint discomfort gets worse.  None of the other joints are painful today.  Activities of Daily Living:  Patient reports morning stiffness for 10 minutes.   Patient Denies nocturnal pain.  Difficulty dressing/grooming: Denies Difficulty climbing stairs: Reports Difficulty getting out of chair: Reports Difficulty using hands for taps, buttons, cutlery, and/or writing: Denies  Review of Systems  Constitutional: Positive for fatigue. Negative for night sweats, weight gain and weight loss.  HENT: Negative for mouth sores, trouble swallowing, trouble swallowing, mouth dryness and nose dryness.   Eyes: Negative for pain, redness, visual disturbance and dryness.  Respiratory: Negative for cough, shortness of breath and difficulty breathing.   Cardiovascular: Negative for chest pain, palpitations, hypertension, irregular heartbeat and swelling in legs/feet.  Gastrointestinal: Negative for blood in stool, constipation and diarrhea.  Endocrine: Negative for increased urination.  Genitourinary: Negative for vaginal dryness.  Musculoskeletal: Positive for arthralgias, joint pain and morning stiffness. Negative for joint swelling, myalgias, muscle weakness, muscle tenderness and myalgias.  Skin: Negative for color change, rash, hair loss, skin tightness, ulcers and sensitivity to sunlight.  Allergic/Immunologic: Negative for susceptible to infections.    Neurological: Negative for dizziness, memory loss, night sweats and weakness.  Hematological: Negative for swollen glands.  Psychiatric/Behavioral: Positive for sleep disturbance. Negative for depressed mood. The patient is not nervous/anxious.     PMFS History:  Patient Active Problem List   Diagnosis Date Noted  . Moderate aortic regurgitation 11/02/2017  . Lightheadedness 11/02/2017  . Tachycardia 07/26/2017  . Atypical chest pain 07/26/2017  . High risk medications (not anticoagulants) long-term use 01/16/2017  . Baker's cyst of knee, left 12/19/2016  . Pain in both knees 12/19/2016  . Pain of both elbows 12/19/2016  . Malignant melanoma ([place]) 12/19/2016  . Basal cell carcinoma 12/19/2016  . Chronic pansinusitis 08/15/2016  . Deviated septum 08/15/2016  . Epistaxis 08/15/2016  . Hypertrophy, nasal, turbinate 08/15/2016  . Upper airway cough syndrome 04/25/2016  . Hx of adenomatous colonic polyps 06/21/2015  . Hyperlipidemia 11/15/2014  . Rheumatoid arthritis ([place]) 09/01/2014  . Pericarditis 04/24/2011  . Aortic valve disorder 02/14/2009  . ELECTROCARDIOGRAM, ABNORMAL 02/14/2009  . PERNICIOUS ANEMIA 07/10/2007  . DEPRESSION 07/03/2007  . MALAISE AND FATIGUE 07/03/2007  . HYPOTHYROIDISM 04/30/2007  . Essential hypertension 04/30/2007  . Aneurysm of thoracic aorta ([place]) 02/13/2006    Past Medical History:  Diagnosis Date  . Adenomatous colon polyp 2012  . Allergy   . Anxiety   . Aortic insufficiency   . Arthritis   . Basal cell carcinoma   . Cataract   . Depression   . Diverticulosis   . Fibromyalgia   . Fibromyalgia   . Hyperlipidemia   . Hypertension   . Macular degeneration   . Melanoma ([place])   . Murmur, heart   .  Reflux   . Stroke ([place]) 20 years ago   per pt. mild stroke  . Thyroid disease    hypothroidism     Family History  Problem Relation Age of Onset  . Heart disease Mother   . Hypertension Mother   . Mental illness Mother   . Allergies  Mother   . Heart disease Father   . Hypertension Father   . Mental illness Father   . Emphysema Father        smoked  . Allergies Father   . Heart attack Father   . Hyperlipidemia Sister   . Hypertension Sister   . Allergies Sister   . Hypertension Sister   . Diabetes Sister   . Allergies Sister   . Epilepsy Son   . Aneurysm Son   . Hyperthyroidism Daughter   . Hypothyroidism Daughter   . Hypertension Daughter   . Colon cancer Neg Hx   . Esophageal cancer Neg Hx   . Rectal cancer Neg Hx   . Stomach cancer Neg Hx   . Pancreatic cancer Neg Hx   . Prostate cancer Neg Hx    Past Surgical History:  Procedure Laterality Date  . ABDOMINAL HYSTERECTOMY    . APPENDECTOMY    . BASAL CELL CARCINOMA EXCISION     nose   . CARDIAC CATHETERIZATION    . CHOLECYSTECTOMY    . COLONOSCOPY     30 + years ago, unsure of where  . EYE SURGERY    . LASIK Bilateral   . MELANOMA EXCISION    . NASAL SEPTUM SURGERY    . TUBAL LIGATION     Social History   Social History Narrative   Lives alone in a one story home.  Has 2 living children.  Retired Engineer, water.     Immunization History  Administered Date(s) Administered  . Influenza, High Dose Seasonal PF 08/16/2016, 12/12/2017, 09/22/2018  . Pneumococcal Conjugate-13 09/01/2014  . Pneumococcal Polysaccharide-23 12/12/2017  . Td 11/29/2006     Objective: Vital Signs: BP 128/82 (BP Location: Left Arm, Patient Position: Sitting, Cuff Size: Normal)   Pulse 85   Resp 16   Ht 4\' 11"  (1.499 m)   Wt 148 lb (67.1 kg)   BMI 29.89 kg/m    Physical Exam Vitals signs and nursing note reviewed.  Constitutional:      Appearance: She is well-developed.  HENT:     Head: Normocephalic and atraumatic.  Eyes:     Conjunctiva/sclera: Conjunctivae normal.  Neck:     Musculoskeletal: Normal range of motion.  Cardiovascular:     Rate and Rhythm: Normal rate and regular rhythm.     Heart sounds: Normal heart sounds.  Pulmonary:     Effort:  Pulmonary effort is normal.     Breath sounds: Normal breath sounds.  Abdominal:     General: Bowel sounds are normal.     Palpations: Abdomen is soft.  Lymphadenopathy:     Cervical: No cervical adenopathy.  Skin:    General: Skin is warm and dry.     Capillary Refill: Capillary refill takes less than 2 seconds.  Neurological:     Mental Status: She is alert and oriented to person, place, and time.  Psychiatric:        Behavior: Behavior normal.      Musculoskeletal Exam: C-spine thoracic lumbar spine good range of motion.  Shoulder joints elbow joints are in good range of motion.  He has some synovial thickening but  no synovitis over MCPs.  She has discomfort range of motion of her right knee joint.  No warmth swelling or effusion was noted.  CDAI Exam: CDAI Score: 1.4  Patient Global Assessment: 2 (mm); Provider Global Assessment: 2 (mm) Swollen: 0 ; Tender: 1  Joint Exam      Right  Left  Knee   Tender        Investigation: No additional findings.  Imaging: No results found.  Recent Labs: Lab Results  Component Value Date   WBC 3.4 (L) 11/03/2018   HGB 12.2 11/03/2018   PLT 275 11/03/2018   NA 138 10/01/2018   K 3.9 10/01/2018   CL 101 10/01/2018   CO2 29 10/01/2018   GLUCOSE 115 (H) 10/01/2018   BUN 15 10/01/2018   CREATININE 0.70 10/01/2018   BILITOT 0.5 10/14/2018   ALKPHOS 80 10/14/2018   AST 25 10/14/2018   ALT 15 10/14/2018   PROT 6.5 10/14/2018   ALBUMIN 4.0 10/14/2018   CALCIUM 9.4 10/01/2018   GFRAA 99 10/01/2018    Speciality Comments: No specialty comments available.  Procedures:  Large Joint Inj: R knee on 12/08/2018 10:32 AM Indications: pain Details: 27 G 1.5 in needle, medial approach  Arthrogram: No  Medications: 40 mg triamcinolone acetonide 40 MG/ML; 1.5 mL lidocaine 1 % Aspirate: 0 mL Outcome: tolerated well, no immediate complications Procedure, treatment alternatives, risks and benefits explained, specific risks discussed.  Consent was given by the patient. Immediately prior to procedure a time out was called to verify the correct patient, procedure, equipment, support staff and site/side marked as required. Patient was prepped and draped in the usual sterile fashion.     Allergies: Tramadol and Statins   Assessment / Plan:     Visit Diagnoses: Acute pain of right knee-patient is having increased pain and discomfort in her right knee joint.  She is having difficulty driving.  She states she has a trip coming up to [place] and wants to have a cortisone injection.  After informed consent was obtained her right knee joint was injected with cortisone as described above.  She tolerated the procedure well.  Rheumatoid arthritis involving multiple sites with positive rheumatoid factor (HCC)-quite well controlled on Arava and methotrexate combination.  High risk medications (not anticoagulants) long-term use - MTX 0.4 ml sq qwk, Arava 10 mg po qd , folic acid 2mg  po qd   Other medical problems are listed as follows: Essential hypertension  History of melanoma   History of vitamin D deficiency  History of hypothyroidism  History of macular degeneration  History of cholecystectomy  History of depression  History of anxiety  History of hypercholesterolemia  Aortic valve disorder  History of anemia  History of neuropathy  History of hypertension  History of basal cell carcinoma  History of asthma   Orders: No orders of the defined types were placed in this encounter.  No orders of the defined types were placed in this encounter.    Follow-Up Instructions: Return for Rheumatoid arthritis.   Bo Merino, MD  Note - This record has been created using Editor, commissioning.  Chart creation errors have been sought, but may not always  have been located. Such creation errors do not reflect on  the standard of medical care.

## 2018-12-19 ENCOUNTER — Other Ambulatory Visit (HOSPITAL_COMMUNITY): Payer: Self-pay

## 2018-12-19 MED ORDER — CARVEDILOL 3.125 MG PO TABS: 3.1250 mg | ORAL_TABLET | Freq: Two times a day (BID) | ORAL | 2 refills | Status: DC

## 2018-12-22 DIAGNOSIS — H353132 Nonexudative age-related macular degeneration, bilateral, intermediate dry stage: Secondary | ICD-10-CM | POA: Diagnosis not present

## 2019-01-15 ENCOUNTER — Other Ambulatory Visit (HOSPITAL_COMMUNITY): Payer: Self-pay | Admitting: *Deleted

## 2019-01-15 MED ORDER — AMLODIPINE BESYLATE 10 MG PO TABS: 10.0000 mg | ORAL_TABLET | Freq: Every day | ORAL | 5 refills | Status: DC

## 2019-01-16 ENCOUNTER — Ambulatory Visit: Payer: Self-pay

## 2019-01-16 NOTE — Telephone Encounter (Signed)
Patient called in with c/o "sinus congestion, sore throat." She says "the sinus drainage started 2 days ago and the sore throat yesterday. I have this same problem with my sinuses every year about this time, so I know what it is. I don't want this to get into my lungs and cause more problems. I have soreness to the sides of my nose and to my under my eyes. The tenderness is a 4-5 and the sore throat pain is a 6." I asked about other symptoms, she says a cough and earache. According to protocol, see PCP within 24 hours, she asked to be scheduled on Monday with Dr. Lorelei Pont, appointment scheduled for Monday, 01/19/19 at 1600 with Dr. Lorelei Pont, care advice given, patient verbalized understanding.  Reason for Disposition . Earache  Answer Assessment - Initial Assessment Questions 1. LOCATION: "Where does it hurt?"      Tender under eyes and each side of nose 2. ONSET: "When did the sinus pain start?"  (e.g., hours, days)      2 days ago drainage started 3. SEVERITY: "How bad is the pain?"   (Scale 1-10; mild, moderate or severe)   - MILD (1-3): doesn't interfere with normal activities    - MODERATE (4-7): interferes with normal activities (e.g., work or school) or awakens from sleep   - SEVERE (8-10): excruciating pain and patient unable to do any normal activities        4-5; throat sore-6 4. RECURRENT SYMPTOM: "Have you ever had sinus problems before?" If so, ask: "When was the last time?" and "What happened that time?"      Yes about this time every year 5. NASAL CONGESTION: "Is the nose blocked?" If so, ask, "Can you open it or must you breathe through the mouth?"     No 6. NASAL DISCHARGE: "Do you have discharge from your nose?" If so ask, "What color?"     Clear  7. FEVER: "Do you have a fever?" If so, ask: "What is it, how was it measured, and when did it start?"      No 8. OTHER SYMPTOMS: "Do you have any other symptoms?" (e.g., sore throat, cough, earache, difficulty breathing)     Sore  throat, earache, cough 9. PREGNANCY: "Is there any chance you are pregnant?" "When was your last menstrual period?"     No  Protocols used: SINUS PAIN OR CONGESTION-A-AH

## 2019-01-19 ENCOUNTER — Ambulatory Visit (INDEPENDENT_AMBULATORY_CARE_PROVIDER_SITE_OTHER): Payer: Medicare Other | Admitting: Family Medicine

## 2019-01-19 ENCOUNTER — Encounter: Payer: Self-pay | Admitting: Family Medicine

## 2019-01-19 ENCOUNTER — Other Ambulatory Visit: Payer: Self-pay

## 2019-01-19 DIAGNOSIS — J0111 Acute recurrent frontal sinusitis: Secondary | ICD-10-CM

## 2019-01-19 DIAGNOSIS — R0982 Postnasal drip: Secondary | ICD-10-CM

## 2019-01-19 MED ORDER — IPRATROPIUM BROMIDE 0.03 % NA SOLN: 2.0000 | Freq: Four times a day (QID) | NASAL | 6 refills | Status: DC

## 2019-01-19 MED ORDER — DOXYCYCLINE HYCLATE 100 MG PO CAPS: 100.0000 mg | ORAL_CAPSULE | Freq: Two times a day (BID) | ORAL | 0 refills | Status: DC

## 2019-01-19 NOTE — Progress Notes (Signed)
COVID 19 visit  Virtual Visit via Telephone Note  I connected with Katherine Wang on 01/19/19 at  4:00 PM EDT by telephone and verified that I am speaking with the correct person using two identifiers.  I have known this pt for years and can also recognize her voice   I discussed the limitations, risks, security and privacy concerns of performing an evaluation and management service by telephone and the availability of in person appointments. I also discussed with the patient that there may be a patient responsible charge related to this service. The patient expressed understanding and agreed to proceed.  History of Present Illness:  History of RA, melanoma, hypothyroidism, hyperlipidemia She moved to a new home and was exposed to some dust which triggers her allergies- this started a ST about 3 days ago No fever She notes a mild cough but this is normal for her with her BP med  She notes some PND which seems to make her throat sore No vomiting or diarrhea- sometimes mild nausea but this gets better with eating No SOB She did travel to [place] for a family visit about 3 weeks ago - otherwise no travel She notes no sinus pressure or pain   She did take a zyrtec after she was exposed to dust.  She is not sure if this helped or not   No history of glaucoma    Observations/Objective: Pt sounds normal over the phone, no wheezing or cough   Assessment and Plan: PND (post-nasal drip)  Acute recurrent frontal sinusitis    Follow Up Instructions: Will have her start on atrovent nasal for PND Will also give her an doxycycline rx to hold and use if not feeling better in the next 3-4 days  Reminded her that abx may increase risk of toxicity with her methotrexate - would advised that she skip her weekly dose of methotrexate if she does use the doxycycline  See other pt history and complete med list below    I discussed the assessment and treatment plan with the patient. The patient was  provided an opportunity to ask questions and all were answered. The patient agreed with the plan and demonstrated an understanding of the instructions.   The patient was advised to call back or seek an in-person evaluation if the symptoms worsen or if the condition fails to improve as anticipated.  I provided 10 minutes of non-face-to-face time during this encounter.   Lamar Blinks, MD   [place] [facility] [address], [place], [place] [address] [phone]  Date:  01/19/2019   Name:  Katherine Wang   DOB:  07/03/1946   MRN:  [id number]  PCP:  [name], MD    Chief Complaint: No chief complaint on file.   History of Present Illness:  Katherine Wang is a 73 y.o. very pleasant female patient who presents with the following:   Patient Active Problem List   Diagnosis Date Noted  . Moderate aortic regurgitation 11/02/2017  . Lightheadedness 11/02/2017  . Tachycardia 07/26/2017  . Atypical chest pain 07/26/2017  . High risk medications (not anticoagulants) long-term use 01/16/2017  . Baker's cyst of knee, left 12/19/2016  . Pain in both knees 12/19/2016  . Pain of both elbows 12/19/2016  . Malignant melanoma ([place]) 12/19/2016  . Basal cell carcinoma 12/19/2016  . Chronic pansinusitis 08/15/2016  . Deviated septum 08/15/2016  . Epistaxis 08/15/2016  . Hypertrophy,  nasal, turbinate 08/15/2016  . Upper airway cough syndrome 04/25/2016  . Hx of adenomatous colonic polyps 06/21/2015  . Hyperlipidemia 11/15/2014  . Rheumatoid arthritis ([place]) 09/01/2014  . Pericarditis 04/24/2011  . Aortic valve disorder 02/14/2009  . ELECTROCARDIOGRAM, ABNORMAL 02/14/2009  . PERNICIOUS ANEMIA 07/10/2007  . DEPRESSION 07/03/2007  . MALAISE AND FATIGUE 07/03/2007  . HYPOTHYROIDISM 04/30/2007  . Essential hypertension 04/30/2007  . Aneurysm of thoracic aorta ([place]) 02/13/2006    Past Medical History:  Diagnosis Date  .  Adenomatous colon polyp 2012  . Allergy   . Anxiety   . Aortic insufficiency   . Arthritis   . Basal cell carcinoma   . Cataract   . Depression   . Diverticulosis   . Fibromyalgia   . Fibromyalgia   . Hyperlipidemia   . Hypertension   . Macular degeneration   . Melanoma ([place])   . Murmur, heart   . Reflux   . Stroke ([place]) 20 years ago   per pt. mild stroke  . Thyroid disease    hypothroidism     Past Surgical History:  Procedure Laterality Date  . ABDOMINAL HYSTERECTOMY    . APPENDECTOMY    . BASAL CELL CARCINOMA EXCISION     nose   . CARDIAC CATHETERIZATION    . CHOLECYSTECTOMY    . COLONOSCOPY     30 + years ago, unsure of where  . EYE SURGERY    . LASIK Bilateral   . MELANOMA EXCISION    . NASAL SEPTUM SURGERY    . TUBAL LIGATION      Social History   Tobacco Use  . Smoking status: Former Smoker    Packs/day: 2.00    Years: 20.00    Pack years: 40.00    Types: Cigarettes    Last attempt to quit: 10/30/1983    Years since quitting: 35.2  . Smokeless tobacco: Never Used  Substance Use Topics  . Alcohol use: No    Alcohol/week: 0.0 standard drinks  . Drug use: Never    Family History  Problem Relation Age of Onset  . Heart disease Mother   . Hypertension Mother   . Mental illness Mother   . Allergies Mother   . Heart disease Father   . Hypertension Father   . Mental illness Father   . Emphysema Father        smoked  . Allergies Father   . Heart attack Father   . Hyperlipidemia Sister   . Hypertension Sister   . Allergies Sister   . Hypertension Sister   . Diabetes Sister   . Allergies Sister   . Epilepsy Son   . Aneurysm Son   . Hyperthyroidism Daughter   . Hypothyroidism Daughter   . Hypertension Daughter   . Colon cancer Neg Hx   . Esophageal cancer Neg Hx   . Rectal cancer Neg Hx   . Stomach cancer Neg Hx   . Pancreatic cancer Neg Hx   . Prostate cancer Neg Hx     Allergies  Allergen Reactions  . Tramadol Other (See Comments)     dizzy  . Statins     Body ache    Medication list has been reviewed and updated.  Current Outpatient Medications on File Prior to Visit  Medication Sig Dispense Refill  . albuterol (PROVENTIL HFA;VENTOLIN HFA) 108 (90 Base) MCG/ACT inhaler Inhale 2 puffs into the lungs every 6 (six) hours as needed for wheezing or shortness of breath. [place]  g 2  . amLODipine (NORVASC) 10 MG tablet Take 1 tablet (10 mg total) by mouth daily. At bedtime 30 tablet 5  . aspirin EC 81 MG tablet Take 1 tablet (81 mg total) by mouth daily. 90 tablet 3  . carvedilol (COREG) 3.125 MG tablet Take 1 tablet (3.125 mg total) by mouth 2 (two) times daily with a meal. 60 tablet 2  . clonazePAM (KLONOPIN) 0.5 MG tablet TAKE 1 TABLET BY MOUTH AT BEDTIME. May take BID if needed on occasion 60 tablet 2  . Cyanocobalamin (VITAMIN B 12 PO) Take 1 tablet by mouth daily.    . cycloSPORINE (RESTASIS) 0.05 % ophthalmic emulsion 1 drop 2 (two) times daily.    . famotidine (PEPCID) 20 MG tablet TAKE 1 TABLET(20 MG) BY MOUTH AT BEDTIME 30 tablet 5  . famotidine (PEPCID) 40 MG tablet Take 40 mg by mouth daily.    . folic acid (FOLVITE) 1 MG tablet Take 2 tablets (2 mg total) by mouth daily. 180 tablet 3  . hydrochlorothiazide (HYDRODIURIL) 12.5 MG tablet Take 1 tablet (12.5 mg total) by mouth daily. 30 tablet 5  . hydrochlorothiazide (HYDRODIURIL) 25 MG tablet TK 1 T PO QD    . ibuprofen (ADVIL,MOTRIN) 200 MG tablet Take 600 mg by mouth 2 (two) times daily as needed for mild pain.    Marland Kitchen irbesartan (AVAPRO) 300 MG tablet Take 1 tablet (300 mg total) by mouth daily. 30 tablet 11  . leflunomide (ARAVA) 10 MG tablet TAKE 1 TABLET(10 MG) BY MOUTH DAILY 90 tablet 0  . levothyroxine (SYNTHROID, LEVOTHROID) 112 MCG tablet Take 1 tablet (112 mcg total) by mouth daily. 30 tablet 6  . methotrexate 50 MG/2ML injection Inject 0.4 mLs (10 mg total) into the skin once a week. 5 mL 0  . Multiple Vitamins-Minerals (PRESERVISION AREDS) TABS Take 1  tablet by mouth 2 (two) times daily.    . rosuvastatin (CRESTOR) 10 MG tablet Take 15mg  on Monday, Wednesday and Friday, 10mg  all other days. 45 tablet 11  . SAFETY-LOK TB SYRINGE 27GX.5" 27G X 1/2" 1 ML MISC USE AS DIRECTED 12 each 3  . sertraline (ZOLOFT) 100 MG tablet TAKE 2 TABLETS BY MOUTH EVERY DAY 180 tablet 0   No current facility-administered medications on file prior to visit.

## 2019-01-20 ENCOUNTER — Other Ambulatory Visit (HOSPITAL_COMMUNITY): Payer: Self-pay

## 2019-01-20 MED ORDER — AMLODIPINE BESYLATE 10 MG PO TABS: 10.0000 mg | ORAL_TABLET | Freq: Every day | ORAL | 3 refills | Status: DC

## 2019-01-25 ENCOUNTER — Other Ambulatory Visit: Payer: Self-pay | Admitting: Rheumatology

## 2019-01-26 NOTE — Telephone Encounter (Signed)
Last Visit: 12/08/18 Next Visit: 03/30/19 Labs: 11/03/18 Neutropenia noted, which is a stable.   Okay to refill per Dr. Estanislado Pandy

## 2019-02-02 ENCOUNTER — Other Ambulatory Visit: Payer: Self-pay | Admitting: Family Medicine

## 2019-02-02 DIAGNOSIS — F419 Anxiety disorder, unspecified: Secondary | ICD-10-CM

## 2019-02-02 MED ORDER — CLONAZEPAM 0.5 MG PO TABS: ORAL_TABLET | ORAL | 2 refills | Status: DC

## 2019-02-02 NOTE — Telephone Encounter (Signed)
Copied from [place] [id number]. Topic: Quick Communication - Rx Refill/Question >> Feb 02, 2019 10:45 AM Reyne Dumas L wrote: Medication: clonazePAM (KLONOPIN) 0.5 MG tablet  Has the patient contacted their pharmacy? Yes - states she is transferring pharmacies and they can not transfer this medication, needs new script (Agent: If no, request that the patient contact the pharmacy for the refill.) (Agent: If yes, when and what did the pharmacy advise?)  Preferred Pharmacy (with phone number or street name): [id number] - [place], [place] [phone] (Phone) [phone] (Fax)  Agent: Please be advised that RX refills may take up to 3 business days. We ask that you follow-up with your pharmacy.

## 2019-02-02 NOTE — Telephone Encounter (Signed)
New pharmacy

## 2019-02-11 NOTE — Progress Notes (Signed)
[place] at [facility] [address], [place], [place] [address] [phone]  Date:  02/12/2019   Name:  Katherine Wang   DOB:  19-May-1946   MRN:  [id number]  PCP:  Katherine Mclean, MD    Chief Complaint: No chief complaint on file.   History of Present Illness:  Katherine Wang is a 73 y.o. very pleasant female patient who presents with the following:  Virtual visit today due to COVID-19 pandemic Patient location is home Provider location is office Patient identity confirmed with name and date of birth, she gives consent for virtual visit today We did a virtual visit about 3 weeks ago for PND, I recommended atrovent nasal and gave rx for doxycycline to hold   Pt notes that she wanted to talk today about an earache.  She did end up taking the abx - doxy- as above Just the right ear is aching- it has been hurting for about one month; got better with doxycycline, then seemed to return a couple of days ago She has some discomfort if she wiggles the external ear No change in her hearing She does not wear hearing aids No blood or drainage from the ear She has noted a cough for quite a while- ?due to one of her BP meds-no change here She has checked her temp and no fever noted  Her weight is 147.8 at home  She has noted a mild ST as well- she will get back on her zyrtec for allergies  Wt Readings from Last 3 Encounters:  12/08/18 148 lb (67.1 kg)  11/03/18 149 lb (67.6 kg)  08/13/18 149 lb (67.6 kg)    She sees rheumatology, Dr. Jonnie Finner, for her rheumatoid arthritis and osteoarthritis Her main issue here is right knee pain.  Otherwise her symptoms are controlled on [place] and methotrexate  She last saw her cardiologist about 1 year ago, as follows: 1. Aortic valve disorder: Malformed aortic valve with mild to moderate AI and mild AS on 1/19 echo. Suspect rheumatic heart disease. - Repeat echo at 2 years in 1/21 2.  Ascending aorta dilation: Mild on CTA chest in 2/19.  Can repeat in 2 years in 2/21.   3. HTN: BP is still high.   - Add HCTZ 25 mg daily with BMET in 10 days. Followup with Doroteo Bradford in pharmacy clinic for BP medication titration.    - She has some symptoms of OSA, which can potentiate high BP. I will arrange for a sleep study.  4. Hyperlipidemia: Continue atorvastatin, check lipids today.  5. Chest pain: Atypical, minimal.   Patient Active Problem List   Diagnosis Date Noted  . Moderate aortic regurgitation 11/02/2017  . Lightheadedness 11/02/2017  . Tachycardia 07/26/2017  . Atypical chest pain 07/26/2017  . High risk medications (not anticoagulants) long-term use 01/16/2017  . Baker's cyst of knee, left 12/19/2016  . Pain in both knees 12/19/2016  . Pain of both elbows 12/19/2016  . Malignant melanoma ([place]) 12/19/2016  . Basal cell carcinoma 12/19/2016  . Chronic pansinusitis 08/15/2016  . Deviated septum 08/15/2016  . Epistaxis 08/15/2016  . Hypertrophy, nasal, turbinate 08/15/2016  . Upper airway cough syndrome 04/25/2016  . Hx of adenomatous colonic polyps 06/21/2015  . Hyperlipidemia 11/15/2014  . Rheumatoid arthritis ([place]) 09/01/2014  . Pericarditis 04/24/2011  . Aortic valve disorder 02/14/2009  . ELECTROCARDIOGRAM, ABNORMAL 02/14/2009  . PERNICIOUS ANEMIA 07/10/2007  . DEPRESSION 07/03/2007  .  MALAISE AND FATIGUE 07/03/2007  . HYPOTHYROIDISM 04/30/2007  . Essential hypertension 04/30/2007  . Aneurysm of thoracic aorta ([place]) 02/13/2006    Past Medical History:  Diagnosis Date  . Adenomatous colon polyp 2012  . Allergy   . Anxiety   . Aortic insufficiency   . Arthritis   . Basal cell carcinoma   . Cataract   . Depression   . Diverticulosis   . Fibromyalgia   . Fibromyalgia   . Hyperlipidemia   . Hypertension   . Macular degeneration   . Melanoma ([place])   . Murmur, heart   . Reflux   . Stroke ([place]) 20 years ago   per pt. mild stroke  . Thyroid disease     hypothroidism     Past Surgical History:  Procedure Laterality Date  . ABDOMINAL HYSTERECTOMY    . APPENDECTOMY    . BASAL CELL CARCINOMA EXCISION     nose   . CARDIAC CATHETERIZATION    . CHOLECYSTECTOMY    . COLONOSCOPY     30 + years ago, unsure of where  . EYE SURGERY    . LASIK Bilateral   . MELANOMA EXCISION    . NASAL SEPTUM SURGERY    . TUBAL LIGATION      Social History   Tobacco Use  . Smoking status: Former Smoker    Packs/day: 2.00    Years: 20.00    Pack years: 40.00    Types: Cigarettes    Last attempt to quit: 10/30/1983    Years since quitting: 35.3  . Smokeless tobacco: Never Used  Substance Use Topics  . Alcohol use: No    Alcohol/week: 0.0 standard drinks  . Drug use: Never    Family History  Problem Relation Age of Onset  . Heart disease Mother   . Hypertension Mother   . Mental illness Mother   . Allergies Mother   . Heart disease Father   . Hypertension Father   . Mental illness Father   . Emphysema Father        smoked  . Allergies Father   . Heart attack Father   . Hyperlipidemia Sister   . Hypertension Sister   . Allergies Sister   . Hypertension Sister   . Diabetes Sister   . Allergies Sister   . Epilepsy Son   . Aneurysm Son   . Hyperthyroidism Daughter   . Hypothyroidism Daughter   . Hypertension Daughter   . Colon cancer Neg Hx   . Esophageal cancer Neg Hx   . Rectal cancer Neg Hx   . Stomach cancer Neg Hx   . Pancreatic cancer Neg Hx   . Prostate cancer Neg Hx     Allergies  Allergen Reactions  . Tramadol Other (See Comments)    dizzy  . Statins     Body ache    Medication list has been reviewed and updated.  Current Outpatient Medications on File Prior to Visit  Medication Sig Dispense Refill  . albuterol (PROVENTIL HFA;VENTOLIN HFA) 108 (90 Base) MCG/ACT inhaler Inhale 2 puffs into the lungs every 6 (six) hours as needed for wheezing or shortness of breath. 18 g 2  . amLODipine (NORVASC) 10 MG tablet  Take 1 tablet (10 mg total) by mouth daily. At bedtime 90 tablet 3  . aspirin EC 81 MG tablet Take 1 tablet (81 mg total) by mouth daily. 90 tablet 3  . carvedilol (COREG) 3.125 MG tablet Take 1 tablet (3.125 mg  total) by mouth 2 (two) times daily with a meal. 60 tablet 2  . clonazePAM (KLONOPIN) 0.5 MG tablet TAKE 1 TABLET BY MOUTH AT BEDTIME. May take BID if needed on occasion 60 tablet 2  . Cyanocobalamin (VITAMIN B 12 PO) Take 1 tablet by mouth daily.    . cycloSPORINE (RESTASIS) 0.05 % ophthalmic emulsion 1 drop 2 (two) times daily.    Marland Kitchen doxycycline (VIBRAMYCIN) 100 MG capsule Take 1 capsule (100 mg total) by mouth 2 (two) times daily. 20 capsule 0  . famotidine (PEPCID) 20 MG tablet TAKE 1 TABLET(20 MG) BY MOUTH AT BEDTIME 30 tablet 5  . famotidine (PEPCID) 40 MG tablet Take 40 mg by mouth daily.    . folic acid (FOLVITE) 1 MG tablet Take 2 tablets (2 mg total) by mouth daily. 180 tablet 3  . hydrochlorothiazide (HYDRODIURIL) 12.5 MG tablet Take 1 tablet (12.5 mg total) by mouth daily. 30 tablet 5  . hydrochlorothiazide (HYDRODIURIL) 25 MG tablet TK 1 T PO QD    . ibuprofen (ADVIL,MOTRIN) 200 MG tablet Take 600 mg by mouth 2 (two) times daily as needed for mild pain.    Marland Kitchen ipratropium (ATROVENT) 0.03 % nasal spray Place 2 sprays into the nose 4 (four) times daily. 30 mL 6  . irbesartan (AVAPRO) 300 MG tablet Take 1 tablet (300 mg total) by mouth daily. 30 tablet 11  . leflunomide (ARAVA) 10 MG tablet TAKE 1 TABLET BY MOUTH EVERY DAY 90 tablet 0  . levothyroxine (SYNTHROID, LEVOTHROID) 112 MCG tablet Take 1 tablet (112 mcg total) by mouth daily. 30 tablet 6  . methotrexate 50 MG/2ML injection Inject 0.4 mLs (10 mg total) into the skin once a week. 5 mL 0  . Multiple Vitamins-Minerals (PRESERVISION AREDS) TABS Take 1 tablet by mouth 2 (two) times daily.    . rosuvastatin (CRESTOR) 10 MG tablet Take 15mg  on Monday, Wednesday and Friday, 10mg  all other days. 45 tablet 11  . SAFETY-LOK TB SYRINGE  27GX.5" 27G X 1/2" 1 ML MISC USE AS DIRECTED 12 each 3  . sertraline (ZOLOFT) 100 MG tablet TAKE 2 TABLETS BY MOUTH EVERY DAY 180 tablet 0   No current facility-administered medications on file prior to visit.     Review of Systems:  As per HPI- otherwise negative. No fever or chills  Physical Examination: There were no vitals filed for this visit. There were no vitals filed for this visit. There is no height or weight on file to calculate BMI. Ideal Body Weight:    Patient observed over camera today.  She looks well, no cough, tachypnea, wheezing is apparent.  No apparent distress  Assessment and Plan: Right ear pain - Plan: acetic acid-hydrocortisone (VOSOL-HC) OTIC solution  Patient notes pain in her right ear for the last couple of days.  It was present a month ago, but then seem to resolve.  She recently took a course of doxycycline by mouth.  Katrinia is not eager to come to the office due to current pandemic.  We will have her try a course of acetic acid and hydrocortisone eardrops as above.  However, have asked her to let me know if not feeling better in the next few days.  I also suggested that she go back on her Zyrtec for allergies  Sent my chart message regarding today's visit to her account:  It was very nice to talk to you today, I hope that the drops are helpful for your ear pain.  You  can use them for 5 to 7 days.  Let me know if any problems or concerns  JC  Signed Lamar Blinks, MD

## 2019-02-12 ENCOUNTER — Encounter: Payer: Self-pay | Admitting: Family Medicine

## 2019-02-12 ENCOUNTER — Ambulatory Visit (INDEPENDENT_AMBULATORY_CARE_PROVIDER_SITE_OTHER): Payer: Medicare Other | Admitting: Family Medicine

## 2019-02-12 ENCOUNTER — Ambulatory Visit: Payer: Medicare Other | Admitting: Family Medicine

## 2019-02-12 ENCOUNTER — Ambulatory Visit: Payer: Self-pay | Admitting: Family Medicine

## 2019-02-12 ENCOUNTER — Other Ambulatory Visit: Payer: Self-pay

## 2019-02-12 VITALS — BP 150/83 | HR 79 | Temp 97.8°F | Resp 16 | Ht 59.0 in | Wt 150.0 lb

## 2019-02-12 DIAGNOSIS — H9201 Otalgia, right ear: Secondary | ICD-10-CM

## 2019-02-12 MED ORDER — HYDROCORTISONE-ACETIC ACID 1-2 % OT SOLN: 3.0000 [drp] | Freq: Four times a day (QID) | OTIC | 0 refills | Status: DC

## 2019-02-12 MED ORDER — CEFDINIR 300 MG PO CAPS: 300.0000 mg | ORAL_CAPSULE | Freq: Two times a day (BID) | ORAL | 0 refills | Status: DC

## 2019-02-12 NOTE — Patient Instructions (Signed)
You can use the omnicef antibiotic if you like.  However I would recommend waiting a few days to see if your earache will resolve- I don't see any sign of a middle ear infection which would require oral antibiotics at this time.  However certainly the ear drops I prescribed earlier today may be helpful in the meantime  Please also try an OTC allergy med for likely allergy to pollen   Let me know how you are doing over the weekend!

## 2019-02-12 NOTE — Progress Notes (Signed)
[facility] [address] [place], [place] [address] [phone]  Date:  02/12/2019   Name:  Katherine Wang   DOB:  [birth year]   MRN:  [id number]  PCP:  [name], MD    Chief Complaint: Otalgia   History of Present Illness:  Katherine Wang is a 73 y.o. very pleasant female patient who presents with the following:  Pt decided to come in today due to ear pain- see virtual visit from earlier today  She notes that last night she kept heat on her ear, but as it is still hurting she is concerned that it may get worse over the weekend.  I did assure her that she could reach me via MyChart over the weekend if need be, but she decided to come in anyway  No fever or cough  She finished doxycycline about 2 weeks ago  She had stopped methotrexate for a few weeks due to concerns about immunosuppression.  She is back on methotrexate just this week- she took her shot 1-2 days ago    Patient Active Problem List   Diagnosis Date Noted  . Moderate aortic regurgitation 11/02/2017  . Lightheadedness 11/02/2017  . Tachycardia 07/26/2017  . Atypical chest pain 07/26/2017  . High risk medications (not anticoagulants) long-term use 01/16/2017  . Baker's cyst of knee, left 12/19/2016  . Pain in both knees 12/19/2016  . Pain of both elbows 12/19/2016  . Malignant melanoma ([place]) 12/19/2016  . Basal cell carcinoma 12/19/2016  . Chronic pansinusitis 08/15/2016  . Deviated septum 08/15/2016  . Epistaxis 08/15/2016  . Hypertrophy, nasal, turbinate 08/15/2016  . Upper airway cough syndrome 04/25/2016  . Hx of adenomatous colonic polyps 06/21/2015  . Hyperlipidemia 11/15/2014  . Rheumatoid arthritis ([place]) 09/01/2014  . Pericarditis 04/24/2011  . Aortic valve disorder 02/14/2009  . ELECTROCARDIOGRAM, ABNORMAL 02/14/2009  . PERNICIOUS ANEMIA 07/10/2007  . DEPRESSION 07/03/2007  . MALAISE AND FATIGUE 07/03/2007  . HYPOTHYROIDISM  04/30/2007  . Essential hypertension 04/30/2007  . Aneurysm of thoracic aorta ([place]) 02/13/2006    Past Medical History:  Diagnosis Date  . Adenomatous colon polyp 2012  . Allergy   . Anxiety   . Aortic insufficiency   . Arthritis   . Basal cell carcinoma   . Cataract   . Depression   . Diverticulosis   . Fibromyalgia   . Fibromyalgia   . Hyperlipidemia   . Hypertension   . Macular degeneration   . Melanoma ([place])   . Murmur, heart   . Reflux   . Stroke ([place]) 20 years ago   per pt. mild stroke  . Thyroid disease    hypothroidism     Past Surgical History:  Procedure Laterality Date  . ABDOMINAL HYSTERECTOMY    . APPENDECTOMY    . BASAL CELL CARCINOMA EXCISION     nose   . CARDIAC CATHETERIZATION    . CHOLECYSTECTOMY    . COLONOSCOPY     30 + years ago, unsure of where  . EYE SURGERY    . LASIK Bilateral   . MELANOMA EXCISION    . NASAL SEPTUM SURGERY    . TUBAL LIGATION      Social History   Tobacco Use  . Smoking status: Former Smoker    Packs/day: 2.00    Years: 20.00    Pack years: 40.00    Types: Cigarettes    Last attempt to quit:  10/30/1983    Years since quitting: 35.3  . Smokeless tobacco: Never Used  Substance Use Topics  . Alcohol use: No    Alcohol/week: 0.0 standard drinks  . Drug use: Never    Family History  Problem Relation Age of Onset  . Heart disease Mother   . Hypertension Mother   . Mental illness Mother   . Allergies Mother   . Heart disease Father   . Hypertension Father   . Mental illness Father   . Emphysema Father        smoked  . Allergies Father   . Heart attack Father   . Hyperlipidemia Sister   . Hypertension Sister   . Allergies Sister   . Hypertension Sister   . Diabetes Sister   . Allergies Sister   . Epilepsy Son   . Aneurysm Son   . Hyperthyroidism Daughter   . Hypothyroidism Daughter   . Hypertension Daughter   . Colon cancer Neg Hx   . Esophageal cancer Neg Hx   . Rectal cancer Neg Hx   .  Stomach cancer Neg Hx   . Pancreatic cancer Neg Hx   . Prostate cancer Neg Hx     Allergies  Allergen Reactions  . Tramadol Other (See Comments)    dizzy  . Statins     Body ache    Medication list has been reviewed and updated.  Current Outpatient Medications on File Prior to Visit  Medication Sig Dispense Refill  . acetic acid-hydrocortisone (VOSOL-HC) OTIC solution Place 3 drops into the right ear 4 (four) times daily. Use for 5 days for ear pain 10 mL 0  . albuterol (PROVENTIL HFA;VENTOLIN HFA) 108 (90 Base) MCG/ACT inhaler Inhale 2 puffs into the lungs every 6 (six) hours as needed for wheezing or shortness of breath. 18 g 2  . amLODipine (NORVASC) 10 MG tablet Take 1 tablet (10 mg total) by mouth daily. At bedtime 90 tablet 3  . aspirin EC 81 MG tablet Take 1 tablet (81 mg total) by mouth daily. 90 tablet 3  . carvedilol (COREG) 3.125 MG tablet Take 1 tablet (3.125 mg total) by mouth 2 (two) times daily with a meal. 60 tablet 2  . clonazePAM (KLONOPIN) 0.5 MG tablet TAKE 1 TABLET BY MOUTH AT BEDTIME. May take BID if needed on occasion 60 tablet 2  . Cyanocobalamin (VITAMIN B 12 PO) Take 1 tablet by mouth daily.    . cycloSPORINE (RESTASIS) 0.05 % ophthalmic emulsion 1 drop 2 (two) times daily.    Marland Kitchen doxycycline (VIBRAMYCIN) 100 MG capsule Take 1 capsule (100 mg total) by mouth 2 (two) times daily. 20 capsule 0  . famotidine (PEPCID) 20 MG tablet TAKE 1 TABLET(20 MG) BY MOUTH AT BEDTIME 30 tablet 5  . famotidine (PEPCID) 40 MG tablet Take 40 mg by mouth daily.    . folic acid (FOLVITE) 1 MG tablet Take 2 tablets (2 mg total) by mouth daily. 180 tablet 3  . hydrochlorothiazide (HYDRODIURIL) 25 MG tablet TK 1 T PO QD    . ibuprofen (ADVIL,MOTRIN) 200 MG tablet Take 600 mg by mouth 2 (two) times daily as needed for mild pain.    Marland Kitchen ipratropium (ATROVENT) 0.03 % nasal spray Place 2 sprays into the nose 4 (four) times daily. 30 mL 6  . irbesartan (AVAPRO) 300 MG tablet Take 1 tablet  (300 mg total) by mouth daily. 30 tablet 11  . leflunomide (ARAVA) 10 MG tablet TAKE 1 TABLET BY  MOUTH EVERY DAY 90 tablet 0  . levothyroxine (SYNTHROID, LEVOTHROID) 112 MCG tablet Take 1 tablet (112 mcg total) by mouth daily. 30 tablet 6  . methotrexate 50 MG/2ML injection Inject 0.4 mLs (10 mg total) into the skin once a week. 5 mL 0  . Multiple Vitamins-Minerals (PRESERVISION AREDS) TABS Take 1 tablet by mouth 2 (two) times daily.    . rosuvastatin (CRESTOR) 10 MG tablet Take 15mg  on Monday, Wednesday and Friday, 10mg  all other days. 45 tablet 11  . SAFETY-LOK TB SYRINGE 27GX.5" 27G X 1/2" 1 ML MISC USE AS DIRECTED 12 each 3  . sertraline (ZOLOFT) 100 MG tablet TAKE 2 TABLETS BY MOUTH EVERY DAY 180 tablet 0  . hydrochlorothiazide (HYDRODIURIL) 12.5 MG tablet Take 1 tablet (12.5 mg total) by mouth daily. 30 tablet 5   No current facility-administered medications on file prior to visit.     Review of Systems:  As per HPI- otherwise negative.   Physical Examination: Vitals:   02/12/19 1528  BP: (!) 150/83  Pulse: 79  Resp: 16  Temp: 97.8 F (36.6 C)  SpO2: 98%   Vitals:   02/12/19 1528  Weight: 150 lb (68 kg)  Height: 4\' 11"  (1.499 m)   Body mass index is 30.3 kg/m. Ideal Body Weight: Weight in (lb) to have BMI = 25: 123.5  GEN: WDWN, NAD, Non-toxic, A & O x 3, overweight, looks well HEENT: Atraumatic, Normocephalic. Neck supple. No masses, No LAD.  Bilateral TM wnl, oropharynx normal.  PEERL,EOMI. both ear canals and TM appear quite normal.  She has mild cervical lymphadenopathy which is tender on the right Ears and Nose: No external deformity. CV: RRR, No M/G/R. No JVD. No thrill. No extra heart sounds. PULM: CTA B, no wheezes, crackles, rhonchi. No retractions. No resp. distress. No accessory muscle use. EXTR: No c/c/e NEURO Normal gait.  PSYCH: Normally interactive. Conversant. Not depressed or anxious appearing.  Calm demeanor.    Assessment and Plan: Right ear  pain - Plan: cefdinir (OMNICEF) 300 MG capsule  Visit due to right ear pain.  Katherine Wang was recently treated with doxycycline for another upper respiratory infection.  I advised her that I really would suggest waiting a couple of days to see if her ear will feel better.  I have given her eardrops to use in the meantime.  She is very concerned that she needs another oral antibiotic.  I did send in a prescription for Omnicef, which is safe to use with methotrexate I encouraged her to hold the [facility] prescription and only use if she is not improved in the next few days I have asked her to keep me posted as to her progress via [place], MD

## 2019-02-12 NOTE — Patient Instructions (Signed)
It was very nice to talk to you today, I hope that the drops are helpful for your ear pain.  You can use them for 5 to 7 days.  Let me know if any problems or concerns  JC

## 2019-02-18 ENCOUNTER — Encounter (HOSPITAL_COMMUNITY): Payer: Self-pay | Admitting: Cardiology

## 2019-02-21 ENCOUNTER — Other Ambulatory Visit: Payer: Self-pay | Admitting: Family Medicine

## 2019-02-21 ENCOUNTER — Other Ambulatory Visit: Payer: Self-pay | Admitting: Rheumatology

## 2019-02-21 DIAGNOSIS — F32A Depression, unspecified: Secondary | ICD-10-CM

## 2019-02-21 DIAGNOSIS — F329 Major depressive disorder, single episode, unspecified: Secondary | ICD-10-CM

## 2019-02-23 ENCOUNTER — Telehealth: Payer: Self-pay | Admitting: *Deleted

## 2019-02-23 NOTE — Telephone Encounter (Signed)
Patient is scheduled for a virtual visit tomorrow 02/24/2019 with Dr. Estanislado Pandy to discuss.

## 2019-02-23 NOTE — Telephone Encounter (Signed)
Last Visit: 12/08/2018 Next Visit: 03/30/2019 Labs: 11/03/2018 CBC, 10/01/2018 CMP   Attempted to contact patient and left message on machine that she is due to update labs. Advised patient she would need to go to a [facility] or [facility].   Okay to refill 30 day supply, per Dr. Estanislado Pandy.

## 2019-02-23 NOTE — Progress Notes (Signed)
Virtual Visit via Telephone Note  I connected with Katherine Wang on 02/23/19 at 11:15 AM EDT by telephone and verified that I am speaking with the correct person using two identifiers.   I discussed the limitations, risks, security and privacy concerns of performing an evaluation and management service by telephone and the availability of in person appointments. I also discussed with the patient that there may be a patient responsible charge related to this service. The patient expressed understanding and agreed to proceed. This service was conducted via virtual visit.  Patient unable to use Webex.  Reached patient via telephone. The patient was located at home. I was located in my office.  Consent was obtained prior to the virtual visit and is aware of possible charges through their insurance for this visit.  The patient is an established patient.  Dr. Estanislado Pandy, MD conducted the virtual visit and Hazel Sams, PA-C acted as scribe during the service.  Office staff helped with scheduling follow up visits after the service was conducted.   CC: Right elbow joint pain   History of Present Illness: Patient is a 73 year old female with a past medical history of seropositive rheumatoid arthritis and osteoarthritis.  She is on MTX 0.4 ml sq once weekly, folic acid 1 mg po daily, and Arava 10 mg po daily. She held MTX for 3 weeks at the beginning of the coronavirus pandemic.  She restarted on MTX last week due to having increased joint pain.  She is currently having right elbow joint pain and swelling.  She has noticed nodules on her elbow.   She is having increased pain in both knee joints. She needs a refill of MTX and syringes .     Review of Systems  Constitutional: Positive for malaise/fatigue. Negative for fever.  Eyes: Negative for photophobia, pain, discharge and redness.  Respiratory: Negative for cough, shortness of breath and wheezing.   Cardiovascular: Negative for chest pain and palpitations.   Gastrointestinal: Negative for blood in stool, constipation and diarrhea.  Genitourinary: Negative for dysuria.  Musculoskeletal: Positive for joint pain. Negative for back pain, myalgias and neck pain.  Skin: Negative for rash.  Neurological: Negative for dizziness and headaches.  Psychiatric/Behavioral: Positive for depression. The patient is nervous/anxious. The patient does not have insomnia.    Observations/Objective:  Physical Exam  Constitutional: She is oriented to person, place, and time.  Neurological: She is alert and oriented to person, place, and time.  Psychiatric: Mood, memory, affect and judgment normal.     Patient reports morning stiffness for 10  minutes.   Patient reports nocturnal pain.  Difficulty dressing/grooming: Denies Difficulty climbing stairs: Reports Difficulty getting out of chair: Reports Difficulty using hands for taps, buttons, cutlery, and/or writing: Reports   Assessment and Plan: Rheumatoid arthritis involving multiple sites with positive rheumatoid factor (HCC)-She is currently having a rheumatoid arthritis flare.  She recently held her MTX for 3 weeks due to worrying about her susceptibility to infections during the COVID-19 pandemic.  She resumed MTX last week due to experiencing increased arthralgias.  She is having right elbow joint pain and swelling.  She is having difficulty using her right elbow joint.  She is also having increased pain in both knee joints.  She will continue on MTX 0.4 ml sq once weekly, folic acid 1 mg po daily, and Arava 10 mg po daily. She will be started on prednisone 20 mg and she will reduce by 5 mg every 4 days.  We will send in refills of MTX and a syringe.  She will be having lab work drawn tomorrow.  She was advised to notify us if her elbow joint pain persists after the prednisone taper. If her joint pain and swelling worsens we will obtain a X-ray and perform a cortisone injection in the future.  She will follow up in  2 months.    High risk medications (not anticoagulants) long-term use - MTX 0.4 ml sq qwk, Arava 10 mg po qd , folic acid 2mg  po qd.  She had lab work on 11/03/18.  She is overdue for lab work.  Standing orders are in place today.  She will have her labs drawn tomorrow.  She was advised to hold MTX and Arava if she develops an infection and resume once the infection has completely cleared.   Other medical problems are listed as follows:  Essential hypertension  History of melanoma   History of vitamin D deficiency  History of hypothyroidism  History of macular degeneration  History of cholecystectomy  History of depression  History of anxiety  History of hypercholesterolemia  Aortic valve disorder  History of anemia  History of neuropathy  History of basal cell carcinoma  History of asthma  Follow Up Instructions: She will follow up in 2 months.   Standing orders are in place.   Prednisone taper will be sent to the pharmacy.   Refill of MTX and syringes will be sent to the pharmacy.     I discussed the assessment and treatment plan with the patient. The patient was provided an opportunity to ask questions and all were answered. The patient agreed with the plan and demonstrated an understanding of the instructions.   The patient was advised to call back or seek an in-person evaluation if the symptoms worsen or if the condition fails to improve as anticipated.  I provided 25 minutes of non-face-to-face time during this encounter. Bo Merino, MD   Scribed by-  Hazel Sams, PA-C

## 2019-02-23 NOTE — Telephone Encounter (Signed)
Left elbow pain, swelling x 4 days, no injury, no redness, warm to the touch, patient's contact [phone], [facility], [employer], taking IBU - not helping.

## 2019-02-24 ENCOUNTER — Encounter: Payer: Self-pay | Admitting: Rheumatology

## 2019-02-24 ENCOUNTER — Telehealth: Payer: Self-pay | Admitting: Rheumatology

## 2019-02-24 ENCOUNTER — Ambulatory Visit (HOSPITAL_COMMUNITY)
Admission: RE | Admit: 2019-02-24 | Discharge: 2019-02-24 | Disposition: A | Discharge status: Home / Self Care | Payer: Medicare Other | Source: Ambulatory Visit | Attending: Cardiology | Admitting: Cardiology

## 2019-02-24 ENCOUNTER — Telehealth (INDEPENDENT_AMBULATORY_CARE_PROVIDER_SITE_OTHER): Payer: Medicare Other | Admitting: Rheumatology

## 2019-02-24 ENCOUNTER — Other Ambulatory Visit: Payer: Self-pay

## 2019-02-24 DIAGNOSIS — M19042 Primary osteoarthritis, left hand: Secondary | ICD-10-CM

## 2019-02-24 DIAGNOSIS — Z9049 Acquired absence of other specified parts of digestive tract: Secondary | ICD-10-CM

## 2019-02-24 DIAGNOSIS — M170 Bilateral primary osteoarthritis of knee: Secondary | ICD-10-CM | POA: Diagnosis not present

## 2019-02-24 DIAGNOSIS — R0789 Other chest pain: Secondary | ICD-10-CM

## 2019-02-24 DIAGNOSIS — M19041 Primary osteoarthritis, right hand: Secondary | ICD-10-CM

## 2019-02-24 DIAGNOSIS — M19071 Primary osteoarthritis, right ankle and foot: Secondary | ICD-10-CM

## 2019-02-24 DIAGNOSIS — M0579 Rheumatoid arthritis with rheumatoid factor of multiple sites without organ or systems involvement: Secondary | ICD-10-CM | POA: Diagnosis not present

## 2019-02-24 DIAGNOSIS — M19072 Primary osteoarthritis, left ankle and foot: Secondary | ICD-10-CM | POA: Diagnosis not present

## 2019-02-24 DIAGNOSIS — Z8669 Personal history of other diseases of the nervous system and sense organs: Secondary | ICD-10-CM

## 2019-02-24 DIAGNOSIS — Z79899 Other long term (current) drug therapy: Secondary | ICD-10-CM

## 2019-02-24 DIAGNOSIS — Z8679 Personal history of other diseases of the circulatory system: Secondary | ICD-10-CM | POA: Diagnosis not present

## 2019-02-24 DIAGNOSIS — Z85820 Personal history of malignant melanoma of skin: Secondary | ICD-10-CM

## 2019-02-24 DIAGNOSIS — Z8659 Personal history of other mental and behavioral disorders: Secondary | ICD-10-CM | POA: Diagnosis not present

## 2019-02-24 DIAGNOSIS — I359 Nonrheumatic aortic valve disorder, unspecified: Secondary | ICD-10-CM | POA: Diagnosis not present

## 2019-02-24 DIAGNOSIS — Z8709 Personal history of other diseases of the respiratory system: Secondary | ICD-10-CM | POA: Diagnosis not present

## 2019-02-24 DIAGNOSIS — Z862 Personal history of diseases of the blood and blood-forming organs and certain disorders involving the immune mechanism: Secondary | ICD-10-CM

## 2019-02-24 DIAGNOSIS — Z8639 Personal history of other endocrine, nutritional and metabolic disease: Secondary | ICD-10-CM | POA: Diagnosis not present

## 2019-02-24 DIAGNOSIS — Z85828 Personal history of other malignant neoplasm of skin: Secondary | ICD-10-CM

## 2019-02-24 MED ORDER — "TUBERCULIN SYRINGE 27G X 1/2"" 1 ML MISC": 3 refills | Status: DC

## 2019-02-24 MED ORDER — CARVEDILOL 3.125 MG PO TABS: 3.1250 mg | ORAL_TABLET | Freq: Two times a day (BID) | ORAL | 3 refills | Status: DC

## 2019-02-24 MED ORDER — PREDNISONE 5 MG PO TABS: ORAL_TABLET | ORAL | 0 refills | Status: DC

## 2019-02-24 NOTE — Telephone Encounter (Signed)
Copied from [place] [id number]. Topic: Quick Communication - Rx Refill/Question >> Feb 24, 2019 10:53 AM Ahmed Prima L wrote: Medication: sertraline (ZOLOFT) 100 MG tablet  Has the patient contacted their pharmacy? Yes - on Friday ( she thinks ) (Agent: If no, request that the patient contact the pharmacy for the refill.) (Agent: If yes, when and what did the pharmacy advise?)  Preferred Pharmacy (with phone number or street name): [facility] [place], [address] [place] [address] Phone: [phone] Fax: [phone]    Agent: Please be advised that RX refills may take up to 3 business days. We ask that you follow-up with your pharmacy.

## 2019-02-24 NOTE — Patient Instructions (Signed)
REFILLED Carvedilol  Your physician has requested that you have an echocardiogram. Echocardiography is a painless test that uses sound waves to create images of your heart. It provides your doctor with information about the size and shape of your heart and how well your heart's chambers and valves are working. This procedure takes approximately one hour. There are no restrictions for this procedure. This will be done at your follow up appointment in 6 months with Dr. Aundra Dubin.  Please follow up with Dr. Aundra Dubin in 6 months with an echocardiogram.

## 2019-02-24 NOTE — Progress Notes (Signed)
avs sent via mail. Pt placed on recall.

## 2019-02-24 NOTE — Telephone Encounter (Signed)
Last Visit: 02/24/2019 Next Visit: 03/30/2019  Okay to refill per Dr. Estanislado Pandy.   Patient has been notified.

## 2019-02-24 NOTE — Progress Notes (Signed)
Heart Failure TeleHealth Note  Due to national recommendations of social distancing due to [place] 19, Audio/video telehealth visit is felt to be most appropriate for this patient at this time.  See MyChart message from today for patient consent regarding telehealth for [facility].  Date:  02/24/2019   ID:  Katherine Wang, DOB [birth year], MRN [id number]  Location: Home  Provider location: [facility] Type of Visit: Established patient   PCP:  Copland, Gay Filler, MD  Cardiologist:  Dr. Aundra Dubin  Chief Complaint: Chest pain   History of Present Illness: Katherine Wang is a 73 y.o. female who presents via audio/video conferencing for a telehealth visit today.     she denies symptoms worrisome for COVID 19.   Patient has history of rheumatoid arthritis, aortic insufficiency, mild ascending aortic dilatation, and suspected prior acute pericarditis.  Patient's cardiac history begins in 2004 when she had possible acute pericarditis in [place].  LHC at that time showed no significant disease.  In our office, she has been followed for a malformed (though trileaflet) aortic valve.  She has had aortic insufficiency and a mildly dilated ascending aorta.  She moved to [place] in 2012 to help her daughter who has an autistic child.  She is back in [place] now.  She says she had a stress test at some time in 62 in [place].  She thinks it was normal.  Echo in 7/17 showed EF 65-70% with moderate AI.  CTA chest in 6/17 showed 3.9 cm ascending aorta.   She had what sounds like a pericarditis flare in 10/17 and was treated with colchicine and ibuprofen with improvement.  Cardiolite was ordered but she cancelled it.   Echo in 1/19 showed EF 60-65% with mild-moderate AI, mild AS.  CTA chest 2/19 showed 3.9 cm ascending aorta.    She seems to be doing well.  BP is finally under reasonable control.  She has actually cut back on HCTZ to 12.5 mg daily due to lightheadedness with  standing.  She has had an arthritis flare involving her left shoulder.  Stable dyspnea with heavy exertion.  She has occasional sharp chest pain that will last < 1 minute.  No definite trigger.   Labs (6/12): BNP 86, ESR 83 Labs (11/15): K 3.8, creatinine 0.94, TSH normal Labs (6/17): Na 131, K 3.7, creatinine 0.8, LDL 77, HDL 52 Labs (1/19): K 3.9, creatinine 0.91 Labs (12/19): K 3.9, creatinine 0.7, hgb 12.2, LDL 79, HDL 42  PMH: 1. Acute pericarditis in 2004: Hospitalized in [place].  Had LHC at that time that she reports as showing no significant CAD.  Possible recurrent pericarditis 6/12 and 10/17.  2. 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 Patient has a trileaflet but malformed aortic valve.  Last echo (4/10) showed EF 55-60%, mild LV dilation, mild LV hypertrophy, mild aortic insufficiency, mild MR.  Cardiac MRI (4/10) confirmed that the aortic valve was trileaflet but malformed, EF 64%.  - Echo (6/12): EF 50%, moderate (grade II) diastolic dysfunction, mild AI, mild MR, no pericardial effusion.   - Echo (2/16): EF 50-55%, moderate AI, ascending aorta 4.0 cm, mild MR.  - Echo (7/17): EF 65-70%, moderate AI - Echo (1/19): EF 60-65%, mild LVH, mild-moderate AI with mild AS, 4.1 cm ascending aorta, mild MR.  3.  Ascending aorta dilation: MRA chest (4/10) with 4 x 3.6 cm ascending aorta.  CTA chest (7/11) with 3.7 x 3.7 cm ascending aorta and aberrant right subclavian  course.  - CTA chest 6/17 with 3.9 cm ascending aorta.  - CTA chest 2/19 with 3.9 cm ascending aorta.  4.  HTN 5.  Rheumatoid arthritis  6.  H/o rheumatic fever in childhood.  7. Hyperlipidemia 8. Aberrant course right subclavian artery 9. 1/19 carotid doppler: mild bilateral stenosis.   Current Outpatient Medications  Medication Sig Dispense Refill  . acetic acid-hydrocortisone (VOSOL-HC) OTIC solution Place 3 drops into the right ear 4 (four) times daily. Use for 5 days for ear pain 10 mL 0  . albuterol  (PROVENTIL HFA;VENTOLIN HFA) 108 (90 Base) MCG/ACT inhaler Inhale 2 puffs into the lungs every 6 (six) hours as needed for wheezing or shortness of breath. 18 g 2  . amLODipine (NORVASC) 10 MG tablet Take 1 tablet (10 mg total) by mouth daily. At bedtime 90 tablet 3  . aspirin EC 81 MG tablet Take 1 tablet (81 mg total) by mouth daily. 90 tablet 3  . carvedilol (COREG) 3.125 MG tablet Take 1 tablet (3.125 mg total) by mouth 2 (two) times daily with a meal. 180 tablet 3  . cefdinir (OMNICEF) 300 MG capsule Take 1 capsule (300 mg total) by mouth 2 (two) times daily. 20 capsule 0  . clonazePAM (KLONOPIN) 0.5 MG tablet TAKE 1 TABLET BY MOUTH AT BEDTIME. May take BID if needed on occasion 60 tablet 2  . Cyanocobalamin (VITAMIN B 12 PO) Take 1 tablet by mouth daily.    . cycloSPORINE (RESTASIS) 0.05 % ophthalmic emulsion 1 drop 2 (two) times daily.    Marland Kitchen doxycycline (VIBRAMYCIN) 100 MG capsule Take 1 capsule (100 mg total) by mouth 2 (two) times daily. 20 capsule 0  . famotidine (PEPCID) 20 MG tablet TAKE 1 TABLET(20 MG) BY MOUTH AT BEDTIME 30 tablet 5  . famotidine (PEPCID) 40 MG tablet Take 40 mg by mouth daily.    . folic acid (FOLVITE) 1 MG tablet Take 2 tablets (2 mg total) by mouth daily. 180 tablet 3  . hydrochlorothiazide (HYDRODIURIL) 12.5 MG tablet Take 1 tablet (12.5 mg total) by mouth daily. 30 tablet 5  . hydrochlorothiazide (HYDRODIURIL) 25 MG tablet TK 1 T PO QD    . ibuprofen (ADVIL,MOTRIN) 200 MG tablet Take 600 mg by mouth 2 (two) times daily as needed for mild pain.    Marland Kitchen ipratropium (ATROVENT) 0.03 % nasal spray Place 2 sprays into the nose 4 (four) times daily. 30 mL 6  . irbesartan (AVAPRO) 300 MG tablet Take 1 tablet (300 mg total) by mouth daily. 30 tablet 11  . leflunomide (ARAVA) 10 MG tablet TAKE 1 TABLET BY MOUTH EVERY DAY 90 tablet 0  . levothyroxine (SYNTHROID, LEVOTHROID) 112 MCG tablet Take 1 tablet (112 mcg total) by mouth daily. 30 tablet 6  . methotrexate 50 MG/2ML  injection ADMINISTER 0.4 ML(10 MG) UNDER THE SKIN 1 TIME A WEEK 5 mL 0  . Multiple Vitamins-Minerals (PRESERVISION AREDS) TABS Take 1 tablet by mouth 2 (two) times daily.    . predniSONE (DELTASONE) 5 MG tablet Take 4 tablets by mouth daily x4 days, 3 tablets by mouth daily x4 days, 2 tablets by mouth daily x4 days, 1 tablet by mouth daily x4 days. 40 tablet 0  . rosuvastatin (CRESTOR) 10 MG tablet Take 5m on Monday, Wednesday and Friday, 130mall other days. 45 tablet 11  . sertraline (ZOLOFT) 100 MG tablet Take 2 tablets (200 mg total) by mouth daily. 180 tablet 0  . TUBERCULIN SYR 1CC/27GX1/2" (SAFETY-LOK TB SYRINGE  27GX.5") 27G X 1/2" 1 ML MISC USE AS DIRECTED 12 each 3   No current facility-administered medications for this encounter.     Allergies:   Tramadol and Statins   Social History:  The patient  reports that she quit smoking about 35 years ago. Her smoking use included cigarettes. She has a 40.00 pack-year smoking history. She has never used smokeless tobacco. She reports that she does not drink alcohol or use drugs.   Family History:  The patient's family history includes Allergies in her father, mother, sister, and sister; Aneurysm in her son; Diabetes in her sister; Emphysema in her father; Epilepsy in her son; Heart attack in her father; Heart disease in her father and mother; Hyperlipidemia in her sister; Hypertension in her daughter, father, mother, sister, and sister; Hyperthyroidism in her daughter; Hypothyroidism in her daughter; Mental illness in her father and mother.   ROS:  Please see the history of present illness.   All other systems are personally reviewed and negative.   Exam:  (Video/Tele Health Call; Exam is subjective and or/visual.) 127/78 (measured by patient) General:  Speaks in full sentences. No resp difficulty. Lungs: Normal respiratory effort with conversation.  Abdomen: Non-distended per patient report Extremities: Pt denies edema. Neuro: Alert &  oriented x 3.   Recent Labs: 09/22/2018: TSH 1.57 10/01/2018: BUN 15; Creat 0.70; Potassium 3.9; Sodium 138 10/14/2018: ALT 15 11/03/2018: Hemoglobin 12.2; Platelets 275  Personally reviewed   Wt Readings from Last 3 Encounters:  02/12/19 68 kg (150 lb)  12/08/18 67.1 kg (148 lb)  11/03/18 67.6 kg (149 lb)      ASSESSMENT AND PLAN:  1. Aortic valve disorder: Malformed aortic valve with mild to moderate AI and mild AS on 1/19 echo. Suspect rheumatic heart disease. - Repeat echo in 6 months at followup.  2. Ascending aorta dilation: Mild on CTA chest in 2/19.  Can repeat in 2 years in 2/21.   3. HTN: BP controlled.  Continue current meds.    4. Hyperlipidemia: Continue atorvastatin, good lipids in 12/19.   5. Chest pain: Atypical, minimal. No change in pattern.   COVID screen The patient does not have any symptoms that suggest any further testing/ screening at this time.  Social distancing reinforced today.  Patient Risk: After full review of this patients clinical status, I feel that they are at moderate risk for cardiac decompensation at this time.  Relevant cardiac medications were reviewed at length with the patient today. The patient does not have concerns regarding their medications at this time.   Recommended follow-up:  6 months with echo  Today, I have spent 15 minutes with the patient with telehealth technology discussing the above issues .    Signed, Loralie Champagne, MD  02/24/2019   [facility] [address] [facility] [place] [address] [phone] (office) [phone] (fax)

## 2019-02-24 NOTE — Telephone Encounter (Signed)
Patient called requesting a prescription for Syringes (to use with her Methotrexate) be sent to [facility] at [address].  Patient states she is due to take her injection tomorrow 02/25/19.

## 2019-02-25 ENCOUNTER — Other Ambulatory Visit: Payer: Self-pay

## 2019-02-25 DIAGNOSIS — M0579 Rheumatoid arthritis with rheumatoid factor of multiple sites without organ or systems involvement: Secondary | ICD-10-CM

## 2019-02-25 LAB — CBC WITH DIFFERENTIAL/PLATELET
Absolute Monocytes: 189 cells/uL — ABNORMAL LOW (ref 200–950)
Basophils Absolute: 10 cells/uL (ref 0–200)
Basophils Relative: 0.3 %
Eosinophils Absolute: 0 cells/uL — ABNORMAL LOW (ref 15–500)
Eosinophils Relative: 0 %
HCT: 35.7 % (ref 35.0–45.0)
Hemoglobin: 12.2 g/dL (ref 11.7–15.5)
Lymphs Abs: 598 cells/uL — ABNORMAL LOW (ref 850–3900)
MCH: 31.8 pg (ref 27.0–33.0)
MCHC: 34.2 g/dL (ref 32.0–36.0)
MCV: 93 fL (ref 80.0–100.0)
MPV: 9.5 fL (ref 7.5–12.5)
Monocytes Relative: 5.9 %
Neutro Abs: 2403 cells/uL (ref 1500–7800)
Neutrophils Relative %: 75.1 %
Platelets: 242 10*3/uL (ref 140–400)
RBC: 3.84 10*6/uL (ref 3.80–5.10)
RDW: 15.3 % — ABNORMAL HIGH (ref 11.0–15.0)
Total Lymphocyte: 18.7 %
WBC: 3.2 10*3/uL — ABNORMAL LOW (ref 3.8–10.8)

## 2019-02-25 LAB — COMPLETE METABOLIC PANEL WITH GFR
AG Ratio: 1.8 (calc) (ref 1.0–2.5)
ALT: 15 U/L (ref 6–29)
AST: 19 U/L (ref 10–35)
Albumin: 4.2 g/dL (ref 3.6–5.1)
Alkaline phosphatase (APISO): 70 U/L (ref 37–153)
BUN: 20 mg/dL (ref 7–25)
CO2: 26 mmol/L (ref 20–32)
Calcium: 9.2 mg/dL (ref 8.6–10.4)
Chloride: 100 mmol/L (ref 98–110)
Creat: 0.8 mg/dL (ref 0.60–0.93)
GFR, Est African American: 84 mL/min/{1.73_m2} (ref >=60)
GFR, Est Non African American: 72 mL/min/{1.73_m2} (ref >=60)
Globulin: 2.4 g/dL (calc) (ref 1.9–3.7)
Glucose, Bld: 115 mg/dL — ABNORMAL HIGH (ref 65–99)
Potassium: 3.8 mmol/L (ref 3.5–5.3)
Sodium: 137 mmol/L (ref 135–146)
Total Bilirubin: 0.7 mg/dL (ref 0.2–1.2)
Total Protein: 6.6 g/dL (ref 6.1–8.1)

## 2019-02-26 NOTE — Progress Notes (Signed)
Labs are stable.

## 2019-03-07 ENCOUNTER — Other Ambulatory Visit: Payer: Self-pay | Admitting: Rheumatology

## 2019-03-09 NOTE — Telephone Encounter (Signed)
Last Visit: 12/08/2018 Next Visit: 03/30/2019  Okay to refill per Dr. Estanislado Pandy

## 2019-03-12 NOTE — Progress Notes (Signed)
Office Visit Note  Patient: Katherine Wang             Date of Birth: 07/03/1946           MRN: [id number]             PCP: [name], MD Referring: [name], MD Visit Date: 03/25/2019 Occupation: @GUAROCC @  Subjective:  Left elbow joint pain    History of Present Illness: Katherine Wang is a 73 y.o. female with history of seropositive rheumatoid arthritis and osteoarthritis.  She is on MTX 0.4 ml sq once weekly, folic acid 1 mg po daily, and Arava 10 mg po daily. She is having left elbow joint pain.  She states when she was on prednisone the pain resolved and her ROM returned to normal.  She is having pain in both ankle joints.  She experiences bilateral knee joint pain.  She denies any joint swelling.    Activities of Daily Living:  Patient reports morning stiffness for 5  minutes.   Patient Reports nocturnal pain.  Difficulty dressing/grooming: Denies Difficulty climbing stairs: Denies Difficulty getting out of chair: Reports Difficulty using hands for taps, buttons, cutlery, and/or writing: Reports  Review of Systems  Constitutional: Negative for fatigue.  HENT: Negative for mouth sores, mouth dryness and nose dryness.   Eyes: Positive for dryness ([place]). Negative for pain and visual disturbance.  Respiratory: Negative for cough, hemoptysis, shortness of breath and difficulty breathing.   Cardiovascular: Negative for chest pain, palpitations, hypertension and swelling in legs/feet.  Gastrointestinal: Negative for blood in stool, constipation and diarrhea.  Endocrine: Negative for increased urination.  Genitourinary: Negative for painful urination.  Musculoskeletal: Positive for arthralgias, joint pain and morning stiffness. Negative for joint swelling, myalgias, muscle weakness, muscle tenderness and myalgias.  Skin: Negative for color change, pallor, rash, hair loss, nodules/bumps, skin tightness, ulcers and sensitivity to sunlight.   Allergic/Immunologic: Negative for susceptible to infections.  Neurological: Negative for dizziness, numbness, headaches and weakness.  Hematological: Negative for swollen glands.  Psychiatric/Behavioral: Negative for depressed mood and sleep disturbance. The patient is not nervous/anxious.     PMFS History:  Patient Active Problem List   Diagnosis Date Noted  . Moderate aortic regurgitation 11/02/2017  . Lightheadedness 11/02/2017  . Tachycardia 07/26/2017  . Atypical chest pain 07/26/2017  . High risk medications (not anticoagulants) long-term use 01/16/2017  . Baker's cyst of knee, left 12/19/2016  . Pain in both knees 12/19/2016  . Pain of both elbows 12/19/2016  . Malignant melanoma ([place]) 12/19/2016  . Basal cell carcinoma 12/19/2016  . Chronic pansinusitis 08/15/2016  . Deviated septum 08/15/2016  . Epistaxis 08/15/2016  . Hypertrophy, nasal, turbinate 08/15/2016  . Upper airway cough syndrome 04/25/2016  . Hx of adenomatous colonic polyps 06/21/2015  . Hyperlipidemia 11/15/2014  . Rheumatoid arthritis ([place]) 09/01/2014  . Pericarditis 04/24/2011  . Aortic valve disorder 02/14/2009  . ELECTROCARDIOGRAM, ABNORMAL 02/14/2009  . PERNICIOUS ANEMIA 07/10/2007  . DEPRESSION 07/03/2007  . MALAISE AND FATIGUE 07/03/2007  . HYPOTHYROIDISM 04/30/2007  . Essential hypertension 04/30/2007  . Aneurysm of thoracic aorta ([place]) 02/13/2006    Past Medical History:  Diagnosis Date  . Adenomatous colon polyp 2012  . Allergy   . Anxiety   . Aortic insufficiency   . Arthritis   . Basal cell carcinoma   . Cataract   . Depression   . Diverticulosis   . Fibromyalgia   . Fibromyalgia   . Hyperlipidemia   .  Hypertension   . Macular degeneration   . Melanoma ([place])   . Murmur, heart   . Reflux   . Stroke ([place]) 20 years ago   per pt. mild stroke  . Thyroid disease    hypothroidism     Family History  Problem Relation Age of Onset  . Heart disease Mother   . Hypertension  Mother   . Mental illness Mother   . Allergies Mother   . Heart disease Father   . Hypertension Father   . Mental illness Father   . Emphysema Father        smoked  . Allergies Father   . Heart attack Father   . Hyperlipidemia Sister   . Hypertension Sister   . Allergies Sister   . Hypertension Sister   . Diabetes Sister   . Allergies Sister   . Epilepsy Son   . Aneurysm Son   . Hyperthyroidism Daughter   . Hypothyroidism Daughter   . Hypertension Daughter   . Colon cancer Neg Hx   . Esophageal cancer Neg Hx   . Rectal cancer Neg Hx   . Stomach cancer Neg Hx   . Pancreatic cancer Neg Hx   . Prostate cancer Neg Hx    Past Surgical History:  Procedure Laterality Date  . ABDOMINAL HYSTERECTOMY    . APPENDECTOMY    . BASAL CELL CARCINOMA EXCISION     nose   . CARDIAC CATHETERIZATION    . CHOLECYSTECTOMY    . COLONOSCOPY     30 + years ago, unsure of where  . EYE SURGERY    . LASIK Bilateral   . MELANOMA EXCISION    . NASAL SEPTUM SURGERY    . TUBAL LIGATION     Social History   Social History Narrative   Lives alone in a one story home.  Has 2 living children.  Retired Engineer, water.     Immunization History  Administered Date(s) Administered  . Influenza, High Dose Seasonal PF 08/16/2016, 12/12/2017, 09/22/2018  . Pneumococcal Conjugate-13 09/01/2014  . Pneumococcal Polysaccharide-23 12/12/2017  . Td 11/29/2006     Objective: Vital Signs: BP 115/77 (BP Location: Left Arm, Patient Position: Sitting, Cuff Size: Normal)   Pulse 84   Resp 13   Ht 4' 11.75" (1.518 m)   Wt 151 lb (68.5 kg)   BMI 29.74 kg/m    Physical Exam Vitals signs and nursing note reviewed.  Constitutional:      Appearance: She is well-developed.  HENT:     Head: Normocephalic and atraumatic.  Eyes:     Conjunctiva/sclera: Conjunctivae normal.  Neck:     Musculoskeletal: Normal range of motion.  Cardiovascular:     Rate and Rhythm: Normal rate and regular rhythm.     Heart  sounds: Normal heart sounds.  Pulmonary:     Effort: Pulmonary effort is normal.     Breath sounds: Normal breath sounds.  Abdominal:     General: Bowel sounds are normal.     Palpations: Abdomen is soft.  Lymphadenopathy:     Cervical: No cervical adenopathy.  Skin:    General: Skin is warm and dry.     Capillary Refill: Capillary refill takes less than 2 seconds.  Neurological:     Mental Status: She is alert and oriented to person, place, and time.  Psychiatric:        Behavior: Behavior normal.      Musculoskeletal Exam: C-spine, thoracic spine, and lumbar spine good  ROM. Limited abduction of bilateral shoulder joints. Tenderness of both shoulder joints.   Left elbow contracture.  Tenderness of left elbow joint.   Synovial thickening of right 2nd MCP.  Tenderness and synovitis of 2nd, 3rd, and 4th PIP joints.  CMC joint synovial thickening.  Tenderness of left 2nd and 3rd PIP joints.  Hip joints have good ROM with no discomfort. Left knee joint warmth and swelling.  Right knee no warmth or effusion.  Tenderness of both ankle joints.    CDAI Exam: CDAI Score: Not documented Patient Global Assessment: Not documented; Provider Global Assessment: Not documented Swollen: 4 ; Tender: 11  Joint Exam      Right  Left  Glenohumeral   Tender   Tender  Elbow      Tender  PIP 2  Swollen Tender   Tender  PIP 3  Swollen Tender   Tender  PIP 4  Swollen Tender     Knee     Swollen Tender  Ankle   Tender   Tender     Investigation: No additional findings.  Imaging: No results found.  Recent Labs: Lab Results  Component Value Date   WBC 3.2 (L) 02/25/2019   HGB 12.2 02/25/2019   PLT 242 02/25/2019   NA 137 02/25/2019   K 3.8 02/25/2019   CL 100 02/25/2019   CO2 26 02/25/2019   GLUCOSE 115 (H) 02/25/2019   BUN 20 02/25/2019   CREATININE 0.80 02/25/2019   BILITOT 0.7 02/25/2019   ALKPHOS 80 10/14/2018   AST 19 02/25/2019   ALT 15 02/25/2019   PROT 6.6 02/25/2019    ALBUMIN 4.0 10/14/2018   CALCIUM 9.2 02/25/2019   GFRAA 84 02/25/2019    Speciality Comments: No specialty comments available.  Procedures:  No procedures performed Allergies: Tramadol and Statins      Assessment / Plan:     Visit Diagnoses: Rheumatoid arthritis involving multiple sites with positive rheumatoid factor ([place]): She has active synovitis as described above.  She is currently having a rheumatoid arthritis flare that started after completing a prednisone taper 2 weeks ago.  She restarted on MTX 0.4 ml sq once weekly, folic acid 1 mg po daily, and Arava 10 mg po daily 1 month ago.   She developed a flare while holding Arava and MTX during the COVID-19 pandemic and was started on a prednisone taper which was effective temporarily but her joint pain and joint swelling have returned.  She will be started on low dose prednisone 10 mg po daily and she will reduce by 2.5 mg every week.  She will also increase Arava to 10 mg alternating with 20 mg every other day. She has a history of neutropenia and melanoma, so she has limited treatment options.  She is in agreement of the plan.  The prescriptions will be sent to the pharmacy.  She will follow up in 3 months.   High risk medications (not anticoagulants) long-term use - MTX 0.4 ml sq qwk, Arava 10 mg alternating with 20 mg po every other day, folic acid 2mg  po qd.  Most recent CBC/CMP stable on 02/25/2019 and will monitor every 3 months.  She has chronic neutropenia and a history of melanoma so she has limited treatment options.  Standing orders are in place.  She received her flu vaccine in November and previously Prevnar 13 and Pneumovax 23 vaccines.    Primary osteoarthritis of both hands: She has PIP and DIP synovial thickening consistent with osteoarthritis.  CMC joint synovial thickening bilaterally.  She has incomplete fist formation bilaterally.  She has active synovitis as described above.  Joint protection and muscle strengthening were  discussed.   Primary osteoarthritis of both knees: She has left knee joint warmth and inflammation on exam.  No warmth or effusion of the right knee joint.  She has good ROM of both knees.    Primary osteoarthritis of both feet: She has no feet pain or joint tenderness.  She wears proper fitting shoes.   Other medical conditions are listed as follows:   History of asthma  History of hypertension  History of hypothyroidism  History of macular degeneration  History of anxiety  History of depression  History of basal cell carcinoma  History of anemia  History of cholecystectomy  History of vitamin D deficiency  History of hypercholesterolemia  History of neuropathy  History of melanoma   Orders: No orders of the defined types were placed in this encounter.  Meds ordered this encounter  Medications  . predniSONE (DELTASONE) 5 MG tablet    Sig: Take 2 tablets (10 mg total) by mouth daily x1wk, 1.5 tablets (7.5 mg total) by mouth daily x1wk, 1 tablet (5 mg total) by mouth daily x1wk, 1/2 tablet (2.5 mg total) by mouth daily x1wk.    Dispense:  35 tablet    Refill:  0  . leflunomide (ARAVA) 10 MG tablet    Sig: Take 1 tablet (10 mg total) by mouth alternating with 2 tablets (20 mg total) by mouth every other day.    Dispense:  45 tablet    Refill:  2    Face-to-face time spent with patient was 30 minutes. Greater than 50% of time was spent in counseling and coordination of care.  Follow-Up Instructions: Return in about 3 months (around 06/25/2019) for Rheumatoid arthritis, Osteoarthritis.   Hazel Sams PA-C  I examined and evaluated the patient with Hazel Sams PA.  Patient experiencing a flare with increased pain and discomfort in multiple joints.  She has synovitis on my examination as described above.  We had detailed discussion regarding treatment options.  Since she has history of melanoma there are very limited treatment choices.  She also had neutropenia which  limits dose adjustment of her medications.  We will try to increase Arava to 20 mg alternating with 10 mg every other day.  A prednisone gradual taper at low-dose was also given.  We will monitor her labs closely.  The plan of care was discussed as noted above.  Bo Merino, MD  Note - This record has been created using Editor, commissioning.  Chart creation errors have been sought, but may not always  have been located. Such creation errors do not reflect on  the standard of medical care.

## 2019-03-13 ENCOUNTER — Ambulatory Visit: Payer: Self-pay | Admitting: *Deleted

## 2019-03-13 NOTE — Telephone Encounter (Signed)
Pt called with having a low grade fever of 99 and was concerned also because of he age. She is normally 97.6. she had chills a couple of weeks ago. But now has a scratchy throat with some drainage and runny nose. States her breathing feels  different.  She has not been around anyone that has tested positive for the virus. But she goes out and wear her mask in public. Per protocol, a provider needs to be notified regarding her symptoms. On call provider, Dr. Volanda Napoleon notified and recommendation received.  Pt is able to do a virtual appointment and is scheduled for 9:30 in the morning with Dr. Volanda Napoleon. Advised to the ED if respiratory distress or chest ;pains. Pt voiced understanding. Routing to flow at [facility] at [facility] for review, Reason for Disposition . HIGH RISK patient (e.g., age > 34 years, diabetes, heart or lung disease, weak immune system)  Answer Assessment - Initial Assessment Questions 1. COVID-19 DIAGNOSIS: "Who made your Coronavirus (COVID-19) diagnosis?" "Was it confirmed by a positive lab test?" If not diagnosed by a HCP, ask "Are there lots of cases (community spread) where you live?" (See public health department website, if unsure)   * MAJOR community spread: high number of cases; numbers of cases are increasing; many people hospitalized.   * MINOR community spread: low number of cases; not increasing; few or no people hospitalized     In the community 2. ONSET: "When did the COVID-19 symptoms start?"      Over the last 3 days 3. WORST SYMPTOM: "What is your worst symptom?" (e.g., cough, fever, shortness of breath, muscle aches)     fever 4. COUGH: "Do you have a cough?" If so, ask: "How bad is the cough?"       yes 5. FEVER: "Do you have a fever?" If so, ask: "What is your temperature, how was it measured, and when did it start?"     99, digital thermometer 6. RESPIRATORY STATUS: "Describe your breathing?" (e.g., shortness of breath, wheezing, unable to speak)      Some chest  tightness 7. BETTER-SAME-WORSE: "Are you getting better, staying the same or getting worse compared to yesterday?"  If getting worse, ask, "In what way?"     Worst than yesterday 8. HIGH RISK DISEASE: "Do you have any chronic medical problems?" (e.g., asthma, heart or lung disease, weak immune system, etc.)     Hypertension, rheumatoid arthritis  9. PREGNANCY: "Is there any chance you are pregnant?" "When was your last menstrual period?"     n/a 10. OTHER SYMPTOMS: "Do you have any other symptoms?"  (e.g., runny nose, headache, sore throat, loss of smell)       Runny nose, scratchy throat  Protocols used: CORONAVIRUS (COVID-19) DIAGNOSED OR SUSPECTED-A-AH

## 2019-03-14 ENCOUNTER — Ambulatory Visit: Payer: Medicare Other | Admitting: Family Medicine

## 2019-03-14 NOTE — Progress Notes (Deleted)
error 

## 2019-03-24 ENCOUNTER — Other Ambulatory Visit: Payer: Self-pay | Admitting: Family Medicine

## 2019-03-24 DIAGNOSIS — E034 Atrophy of thyroid (acquired): Secondary | ICD-10-CM

## 2019-03-25 ENCOUNTER — Other Ambulatory Visit: Payer: Self-pay

## 2019-03-25 ENCOUNTER — Encounter: Payer: Self-pay | Admitting: Rheumatology

## 2019-03-25 ENCOUNTER — Ambulatory Visit (INDEPENDENT_AMBULATORY_CARE_PROVIDER_SITE_OTHER): Payer: Medicare Other | Admitting: Rheumatology

## 2019-03-25 VITALS — BP 115/77 | HR 84 | Resp 13 | Ht 59.75 in | Wt 151.0 lb

## 2019-03-25 DIAGNOSIS — Z85820 Personal history of malignant melanoma of skin: Secondary | ICD-10-CM

## 2019-03-25 DIAGNOSIS — M19071 Primary osteoarthritis, right ankle and foot: Secondary | ICD-10-CM

## 2019-03-25 DIAGNOSIS — Z862 Personal history of diseases of the blood and blood-forming organs and certain disorders involving the immune mechanism: Secondary | ICD-10-CM

## 2019-03-25 DIAGNOSIS — Z9049 Acquired absence of other specified parts of digestive tract: Secondary | ICD-10-CM

## 2019-03-25 DIAGNOSIS — Z85828 Personal history of other malignant neoplasm of skin: Secondary | ICD-10-CM | POA: Diagnosis not present

## 2019-03-25 DIAGNOSIS — Z8709 Personal history of other diseases of the respiratory system: Secondary | ICD-10-CM | POA: Diagnosis not present

## 2019-03-25 DIAGNOSIS — M19072 Primary osteoarthritis, left ankle and foot: Secondary | ICD-10-CM

## 2019-03-25 DIAGNOSIS — Z79899 Other long term (current) drug therapy: Secondary | ICD-10-CM | POA: Diagnosis not present

## 2019-03-25 DIAGNOSIS — Z8639 Personal history of other endocrine, nutritional and metabolic disease: Secondary | ICD-10-CM

## 2019-03-25 DIAGNOSIS — Z8669 Personal history of other diseases of the nervous system and sense organs: Secondary | ICD-10-CM

## 2019-03-25 DIAGNOSIS — M170 Bilateral primary osteoarthritis of knee: Secondary | ICD-10-CM | POA: Diagnosis not present

## 2019-03-25 DIAGNOSIS — Z8679 Personal history of other diseases of the circulatory system: Secondary | ICD-10-CM

## 2019-03-25 DIAGNOSIS — Z8659 Personal history of other mental and behavioral disorders: Secondary | ICD-10-CM

## 2019-03-25 DIAGNOSIS — M0579 Rheumatoid arthritis with rheumatoid factor of multiple sites without organ or systems involvement: Secondary | ICD-10-CM

## 2019-03-25 DIAGNOSIS — M19041 Primary osteoarthritis, right hand: Secondary | ICD-10-CM

## 2019-03-25 DIAGNOSIS — M19042 Primary osteoarthritis, left hand: Secondary | ICD-10-CM

## 2019-03-25 MED ORDER — PREDNISONE 5 MG PO TABS: ORAL_TABLET | ORAL | 0 refills | Status: DC

## 2019-03-25 MED ORDER — LEFLUNOMIDE 10 MG PO TABS: ORAL_TABLET | ORAL | 2 refills | Status: DC

## 2019-03-25 NOTE — Patient Instructions (Addendum)
Take Arava 10 mg alternating with 20 mg every other day Take prednisone 10 mg by mouth daily for 1 week, 7.5 mg by mouth daily x1 week, 5 mg by mouth daily x1 week, and 2.5 mg by mouth daily x1 wk  Standing Labs We placed an order today for your standing lab work.    Please come back and get your standing labs in 2 weeks, 1 month, then in 2 months  We have open lab daily Monday through Thursday from 8:30-12:30 PM and 1:30-4:30 PM and Friday from 8:30-12:30 PM and 1:30 -4:00 PM at the office of Dr. Bo Merino.   You may experience shorter wait times on Monday and Friday afternoons. The office is located at [address], [place], [place], [place] [address] No appointment is necessary.   Labs are drawn by [facility].  You may receive a bill from [place] [facility] for your lab work.  If you wish to have your labs drawn at another location, please call the office 24 hours in advance to send orders.  If you have any questions regarding directions or hours of operation,  please call [phone].   Just as a reminder please drink plenty of water prior to coming for your lab work. Thanks!

## 2019-03-30 ENCOUNTER — Ambulatory Visit: Payer: Self-pay | Admitting: Rheumatology

## 2019-04-02 ENCOUNTER — Ambulatory Visit: Payer: Medicare Other | Admitting: Rheumatology

## 2019-04-04 ENCOUNTER — Observation Stay (HOSPITAL_COMMUNITY)
Admission: EM | Admit: 2019-04-04 | Discharge: 2019-04-04 | Disposition: A | Discharge status: Home / Self Care | Payer: Medicare Other | Attending: Internal Medicine | Admitting: Internal Medicine

## 2019-04-04 ENCOUNTER — Observation Stay (HOSPITAL_COMMUNITY): Payer: Medicare Other

## 2019-04-04 ENCOUNTER — Emergency Department (HOSPITAL_COMMUNITY): Payer: Medicare Other

## 2019-04-04 ENCOUNTER — Encounter (HOSPITAL_COMMUNITY): Payer: Self-pay | Admitting: Emergency Medicine

## 2019-04-04 ENCOUNTER — Other Ambulatory Visit: Payer: Self-pay

## 2019-04-04 DIAGNOSIS — R0609 Other forms of dyspnea: Secondary | ICD-10-CM | POA: Diagnosis not present

## 2019-04-04 DIAGNOSIS — M8580 Other specified disorders of bone density and structure, unspecified site: Secondary | ICD-10-CM | POA: Insufficient documentation

## 2019-04-04 DIAGNOSIS — Z79890 Hormone replacement therapy (postmenopausal): Secondary | ICD-10-CM | POA: Diagnosis not present

## 2019-04-04 DIAGNOSIS — Z1159 Encounter for screening for other viral diseases: Secondary | ICD-10-CM | POA: Diagnosis not present

## 2019-04-04 DIAGNOSIS — Z9049 Acquired absence of other specified parts of digestive tract: Secondary | ICD-10-CM | POA: Insufficient documentation

## 2019-04-04 DIAGNOSIS — K219 Gastro-esophageal reflux disease without esophagitis: Secondary | ICD-10-CM | POA: Diagnosis not present

## 2019-04-04 DIAGNOSIS — I351 Nonrheumatic aortic (valve) insufficiency: Secondary | ICD-10-CM | POA: Insufficient documentation

## 2019-04-04 DIAGNOSIS — Z7951 Long term (current) use of inhaled steroids: Secondary | ICD-10-CM | POA: Insufficient documentation

## 2019-04-04 DIAGNOSIS — J189 Pneumonia, unspecified organism: Secondary | ICD-10-CM

## 2019-04-04 DIAGNOSIS — Z20828 Contact with and (suspected) exposure to other viral communicable diseases: Secondary | ICD-10-CM | POA: Diagnosis not present

## 2019-04-04 DIAGNOSIS — I700 Atherosclerosis of aorta: Secondary | ICD-10-CM | POA: Insufficient documentation

## 2019-04-04 DIAGNOSIS — R05 Cough: Secondary | ICD-10-CM | POA: Diagnosis not present

## 2019-04-04 DIAGNOSIS — Z85820 Personal history of malignant melanoma of skin: Secondary | ICD-10-CM | POA: Insufficient documentation

## 2019-04-04 DIAGNOSIS — Z87891 Personal history of nicotine dependence: Secondary | ICD-10-CM | POA: Insufficient documentation

## 2019-04-04 DIAGNOSIS — E785 Hyperlipidemia, unspecified: Secondary | ICD-10-CM | POA: Diagnosis not present

## 2019-04-04 DIAGNOSIS — R0600 Dyspnea, unspecified: Secondary | ICD-10-CM | POA: Diagnosis not present

## 2019-04-04 DIAGNOSIS — Z8673 Personal history of transient ischemic attack (TIA), and cerebral infarction without residual deficits: Secondary | ICD-10-CM | POA: Insufficient documentation

## 2019-04-04 DIAGNOSIS — I10 Essential (primary) hypertension: Secondary | ICD-10-CM | POA: Diagnosis present

## 2019-04-04 DIAGNOSIS — Z79899 Other long term (current) drug therapy: Secondary | ICD-10-CM | POA: Insufficient documentation

## 2019-04-04 DIAGNOSIS — D72819 Decreased white blood cell count, unspecified: Secondary | ICD-10-CM | POA: Diagnosis present

## 2019-04-04 DIAGNOSIS — E039 Hypothyroidism, unspecified: Secondary | ICD-10-CM | POA: Diagnosis not present

## 2019-04-04 DIAGNOSIS — R0602 Shortness of breath: Secondary | ICD-10-CM | POA: Diagnosis not present

## 2019-04-04 DIAGNOSIS — F329 Major depressive disorder, single episode, unspecified: Secondary | ICD-10-CM | POA: Diagnosis not present

## 2019-04-04 DIAGNOSIS — M0579 Rheumatoid arthritis with rheumatoid factor of multiple sites without organ or systems involvement: Secondary | ICD-10-CM

## 2019-04-04 DIAGNOSIS — M069 Rheumatoid arthritis, unspecified: Secondary | ICD-10-CM | POA: Diagnosis not present

## 2019-04-04 DIAGNOSIS — F419 Anxiety disorder, unspecified: Secondary | ICD-10-CM | POA: Diagnosis not present

## 2019-04-04 DIAGNOSIS — R0789 Other chest pain: Secondary | ICD-10-CM | POA: Insufficient documentation

## 2019-04-04 DIAGNOSIS — R079 Chest pain, unspecified: Secondary | ICD-10-CM | POA: Diagnosis not present

## 2019-04-04 DIAGNOSIS — M797 Fibromyalgia: Secondary | ICD-10-CM | POA: Diagnosis not present

## 2019-04-04 DIAGNOSIS — Z7982 Long term (current) use of aspirin: Secondary | ICD-10-CM | POA: Diagnosis not present

## 2019-04-04 LAB — BASIC METABOLIC PANEL
Anion gap: 12 (ref 5–15)
BUN: 17 mg/dL (ref 8–23)
CO2: 21 mmol/L — ABNORMAL LOW (ref 22–32)
Calcium: 9.2 mg/dL (ref 8.9–10.3)
Chloride: 103 mmol/L (ref 98–111)
Creatinine, Ser: 0.8 mg/dL (ref 0.44–1.00)
GFR calc Af Amer: >60 mL/min (ref >60)
GFR calc non Af Amer: >60 mL/min (ref >60)
Glucose, Bld: 92 mg/dL (ref 70–99)
Potassium: 3.5 mmol/L (ref 3.5–5.1)
Sodium: 136 mmol/L (ref 135–145)

## 2019-04-04 LAB — DIFFERENTIAL
Abs Immature Granulocytes: 0.01 10*3/uL (ref 0.00–0.07)
Basophils Absolute: 0 10*3/uL (ref 0.0–0.1)
Basophils Relative: 1 %
Eosinophils Absolute: 0 10*3/uL (ref 0.0–0.5)
Eosinophils Relative: 2 %
Immature Granulocytes: 0 %
Lymphocytes Relative: 44 %
Lymphs Abs: 1.1 10*3/uL (ref 0.7–4.0)
Monocytes Absolute: 0.2 10*3/uL (ref 0.1–1.0)
Monocytes Relative: 9 %
Neutro Abs: 1.2 10*3/uL — ABNORMAL LOW (ref 1.7–7.7)
Neutrophils Relative %: 44 %

## 2019-04-04 LAB — CBC
HCT: 37 % (ref 36.0–46.0)
Hemoglobin: 12.5 g/dL (ref 12.0–15.0)
MCH: 32.1 pg (ref 26.0–34.0)
MCHC: 33.8 g/dL (ref 30.0–36.0)
MCV: 94.9 fL (ref 80.0–100.0)
Platelets: 215 10*3/uL (ref 150–400)
RBC: 3.9 MIL/uL (ref 3.87–5.11)
RDW: 15.8 % — ABNORMAL HIGH (ref 11.5–15.5)
WBC: 2.7 10*3/uL — ABNORMAL LOW (ref 4.0–10.5)
nRBC: 0 % (ref 0.0–0.2)

## 2019-04-04 LAB — D-DIMER, QUANTITATIVE: D-Dimer, Quant: 0.71 ug/mL-FEU — ABNORMAL HIGH (ref 0.00–0.50)

## 2019-04-04 LAB — TROPONIN I
Troponin I: <0.03 ng/mL (ref <0.03)
Troponin I: <0.03 ng/mL (ref <0.03)
Troponin I: <0.03 ng/mL (ref <0.03)

## 2019-04-04 LAB — SARS CORONAVIRUS 2 BY RT PCR (HOSPITAL ORDER, PERFORMED IN ~~LOC~~ HOSPITAL LAB): SARS Coronavirus 2: NEGATIVE

## 2019-04-04 MED ORDER — ENOXAPARIN SODIUM 40 MG/0.4ML ~~LOC~~ SOLN
40.0000 mg | Freq: Every day | SUBCUTANEOUS | Status: DC
  Administered 2019-04-04: 40 mg via SUBCUTANEOUS
  Filled 2019-04-04: qty 0.4

## 2019-04-04 MED ORDER — ASPIRIN 81 MG PO CHEW
324.0000 mg | CHEWABLE_TABLET | ORAL | Status: AC
  Administered 2019-04-04: 324 mg via ORAL
  Filled 2019-04-04: qty 4

## 2019-04-04 MED ORDER — PREDNISONE 5 MG PO TABS: 5.0000 mg | ORAL_TABLET | Freq: Every day | ORAL | Status: DC

## 2019-04-04 MED ORDER — SODIUM CHLORIDE 0.9% FLUSH: 3.0000 mL | Freq: Once | INTRAVENOUS | Status: DC

## 2019-04-04 MED ORDER — ROSUVASTATIN CALCIUM 10 MG PO TABS: 10.0000 mg | ORAL_TABLET | Freq: Every day | ORAL | Status: DC

## 2019-04-04 MED ORDER — LEFLUNOMIDE 20 MG PO TABS
10.0000 mg | ORAL_TABLET | Freq: Every day | ORAL | Status: DC
  Administered 2019-04-04: 10 mg via ORAL
  Filled 2019-04-04: qty 0.5

## 2019-04-04 MED ORDER — AMLODIPINE BESYLATE 10 MG PO TABS
10.0000 mg | ORAL_TABLET | Freq: Every day | ORAL | Status: DC
  Administered 2019-04-04: 10 mg via ORAL
  Filled 2019-04-04: qty 1

## 2019-04-04 MED ORDER — ROSUVASTATIN CALCIUM 5 MG PO TABS: 10.0000 mg | ORAL_TABLET | Freq: Every day | ORAL | Status: DC

## 2019-04-04 MED ORDER — FOLIC ACID 1 MG PO TABS
2.0000 mg | ORAL_TABLET | Freq: Every day | ORAL | Status: DC
  Administered 2019-04-04: 2 mg via ORAL
  Filled 2019-04-04 (×2): qty 2

## 2019-04-04 MED ORDER — IRBESARTAN 300 MG PO TABS
300.0000 mg | ORAL_TABLET | Freq: Every day | ORAL | Status: DC
  Administered 2019-04-04: 300 mg via ORAL
  Filled 2019-04-04: qty 1

## 2019-04-04 MED ORDER — CLONAZEPAM 0.5 MG PO TABS: 0.5000 mg | ORAL_TABLET | Freq: Every day | ORAL | Status: DC

## 2019-04-04 MED ORDER — HYDROCHLOROTHIAZIDE 12.5 MG PO CAPS
12.5000 mg | ORAL_CAPSULE | Freq: Every day | ORAL | Status: DC
  Administered 2019-04-04: 12.5 mg via ORAL
  Filled 2019-04-04: qty 1

## 2019-04-04 MED ORDER — CLONAZEPAM 0.5 MG PO TABS: 0.5000 mg | ORAL_TABLET | Freq: Every day | ORAL | Status: DC | PRN

## 2019-04-04 MED ORDER — ASPIRIN 300 MG RE SUPP: 300.0000 mg | RECTAL | Status: AC

## 2019-04-04 MED ORDER — PREDNISONE 5 MG PO TABS: 5.0000 mg | ORAL_TABLET | ORAL | Status: DC

## 2019-04-04 MED ORDER — SODIUM CHLORIDE 0.9% FLUSH
3.0000 mL | Freq: Two times a day (BID) | INTRAVENOUS | Status: DC
  Administered 2019-04-04: 3 mL via INTRAVENOUS

## 2019-04-04 MED ORDER — SODIUM CHLORIDE 0.9 % IV SOLN
1.0000 g | Freq: Every day | INTRAVENOUS | Status: DC
  Administered 2019-04-04: 1 g via INTRAVENOUS
  Filled 2019-04-04: qty 10

## 2019-04-04 MED ORDER — LEVOTHYROXINE SODIUM 112 MCG PO TABS
112.0000 ug | ORAL_TABLET | Freq: Every day | ORAL | Status: DC
  Administered 2019-04-04: 112 ug via ORAL
  Filled 2019-04-04: qty 1

## 2019-04-04 MED ORDER — SERTRALINE HCL 100 MG PO TABS
200.0000 mg | ORAL_TABLET | Freq: Every day | ORAL | Status: DC
  Administered 2019-04-04: 200 mg via ORAL
  Filled 2019-04-04: qty 2

## 2019-04-04 MED ORDER — IOHEXOL 350 MG/ML SOLN
100.0000 mL | Freq: Once | INTRAVENOUS | Status: AC | PRN
  Administered 2019-04-04: 100 mL via INTRAVENOUS

## 2019-04-04 MED ORDER — DOXYCYCLINE HYCLATE 100 MG PO CAPS: 100.0000 mg | ORAL_CAPSULE | Freq: Two times a day (BID) | ORAL | 0 refills | Status: DC

## 2019-04-04 MED ORDER — FAMOTIDINE 20 MG PO TABS
20.0000 mg | ORAL_TABLET | Freq: Every day | ORAL | Status: DC
  Administered 2019-04-04: 20 mg via ORAL
  Filled 2019-04-04: qty 1

## 2019-04-04 MED ORDER — CYCLOSPORINE 0.05 % OP EMUL
1.0000 [drp] | Freq: Two times a day (BID) | OPHTHALMIC | Status: DC
  Administered 2019-04-04: 1 [drp] via OPHTHALMIC
  Filled 2019-04-04 (×2): qty 30

## 2019-04-04 MED ORDER — SODIUM CHLORIDE 0.9 % IV SOLN
500.0000 mg | Freq: Every day | INTRAVENOUS | Status: DC
  Administered 2019-04-04: 500 mg via INTRAVENOUS
  Filled 2019-04-04: qty 500

## 2019-04-04 MED ORDER — SODIUM CHLORIDE 0.9% FLUSH: 3.0000 mL | INTRAVENOUS | Status: DC | PRN

## 2019-04-04 MED ORDER — PREDNISONE 5 MG PO TABS
7.5000 mg | ORAL_TABLET | Freq: Every day | ORAL | Status: DC
  Administered 2019-04-04: 7.5 mg via ORAL
  Filled 2019-04-04: qty 2

## 2019-04-04 MED ORDER — CARVEDILOL 3.125 MG PO TABS
3.1250 mg | ORAL_TABLET | Freq: Two times a day (BID) | ORAL | Status: DC
  Administered 2019-04-04: 3.125 mg via ORAL
  Filled 2019-04-04: qty 1

## 2019-04-04 MED ORDER — ASPIRIN EC 81 MG PO TBEC
81.0000 mg | DELAYED_RELEASE_TABLET | Freq: Every day | ORAL | Status: DC
  Filled 2019-04-04: qty 1

## 2019-04-04 MED ORDER — NITROGLYCERIN 0.4 MG SL SUBL: 0.4000 mg | SUBLINGUAL_TABLET | SUBLINGUAL | Status: DC | PRN

## 2019-04-04 MED ORDER — ONDANSETRON HCL 4 MG/2ML IJ SOLN: 4.0000 mg | Freq: Four times a day (QID) | INTRAMUSCULAR | Status: DC | PRN

## 2019-04-04 MED ORDER — ALBUTEROL SULFATE (2.5 MG/3ML) 0.083% IN NEBU: 2.5000 mg | INHALATION_SOLUTION | Freq: Four times a day (QID) | RESPIRATORY_TRACT | Status: DC | PRN

## 2019-04-04 MED ORDER — SODIUM CHLORIDE 0.9 % IV SOLN: 250.0000 mL | INTRAVENOUS | Status: DC | PRN

## 2019-04-04 MED ORDER — ACETAMINOPHEN 325 MG PO TABS: 650.0000 mg | ORAL_TABLET | ORAL | Status: DC | PRN

## 2019-04-04 MED ORDER — PREDNISONE 5 MG PO TABS: 2.5000 mg | ORAL_TABLET | Freq: Every day | ORAL | Status: DC

## 2019-04-04 NOTE — ED Notes (Signed)
To ct

## 2019-04-04 NOTE — H&P (Signed)
History and Physical    DHAMAR GREGORY OZD:[id number] DOB: 1946-07-22 DOA: 04/04/2019  PCP: [name], MD   Patient coming from: Home   Chief Complaint: SOB, chest discomfort   HPI: Katherine Wang is a 73 y.o. female with medical history significant for aortic insufficiency, mild ascending aortic dilatation, rheumatoid arthritis, hypertension, hypothyroidism, and anxiety, now presenting to the emergency department for evaluation of shortness of breath and intermittent chest discomfort.  Patient reports that she has occasional fleeting pain in her mid chest that has been chronic, but more frequent in recent days.  She then developed shortness of breath last night that came on fairly acutely.  She denies any lower extremity swelling, but was experiencing some tenderness in the right calf yesterday.  She had not been feeling febrile, but checked her temperature at home and found it to be 95 F, she was concerned that may be her shortness of breath and abnormal temperature could be related to the novel coronavirus, and so she wanted to be tested for that.  She has not been experiencing nausea or diaphoresis, denies any sick contacts but has been going out to the store recently.    ED Course: Upon arrival to the ED, patient is found to be afebrile, saturating mid 90s on room air, and with remaining vitals also normal.  EKG features a sinus rhythm with ST to ST abnormalities that are similar to priors.  Chest x-ray is negative for acute cardiopulmonary disease.  Chemistry panel is unremarkable.  CBC is notable for a chronic leukopenia, now 2700.  Troponin is undetectable.  D-dimer is 0.71.  ED physician has ordered CTA chest that has not yet been performed, but is concerned that her dyspnea is an anginal equivalent and requests a hospitalist admission for ACS rule out.  Review of Systems:  All other systems reviewed and apart from HPI, are negative.  Past Medical History:  Diagnosis Date  .  Adenomatous colon polyp 2012  . Allergy   . Anxiety   . Aortic insufficiency   . Arthritis   . Basal cell carcinoma   . Cataract   . Depression   . Diverticulosis   . Fibromyalgia   . Fibromyalgia   . Hyperlipidemia   . Hypertension   . Macular degeneration   . Melanoma ([place])   . Murmur, heart   . Reflux   . Stroke ([place]) 20 years ago   per pt. mild stroke  . Thyroid disease    hypothroidism     Past Surgical History:  Procedure Laterality Date  . ABDOMINAL HYSTERECTOMY    . APPENDECTOMY    . BASAL CELL CARCINOMA EXCISION     nose   . CARDIAC CATHETERIZATION    . CHOLECYSTECTOMY    . COLONOSCOPY     30 + years ago, unsure of where  . EYE SURGERY    . LASIK Bilateral   . MELANOMA EXCISION    . NASAL SEPTUM SURGERY    . TUBAL LIGATION       reports that she quit smoking about 35 years ago. Her smoking use included cigarettes. She has a 40.00 pack-year smoking history. She has never used smokeless tobacco. She reports that she does not drink alcohol or use drugs.  Allergies  Allergen Reactions  . Tramadol Other (See Comments)    dizzy  . Statins     Body ache    Family History  Problem Relation Age of Onset  . Heart disease  Mother   . Hypertension Mother   . Mental illness Mother   . Allergies Mother   . Heart disease Father   . Hypertension Father   . Mental illness Father   . Emphysema Father        smoked  . Allergies Father   . Heart attack Father   . Hyperlipidemia Sister   . Hypertension Sister   . Allergies Sister   . Hypertension Sister   . Diabetes Sister   . Allergies Sister   . Epilepsy Son   . Aneurysm Son   . Hyperthyroidism Daughter   . Hypothyroidism Daughter   . Hypertension Daughter   . Colon cancer Neg Hx   . Esophageal cancer Neg Hx   . Rectal cancer Neg Hx   . Stomach cancer Neg Hx   . Pancreatic cancer Neg Hx   . Prostate cancer Neg Hx      Prior to Admission medications   Medication Sig Start Date End Date Taking?  Authorizing Provider  albuterol (PROVENTIL HFA;VENTOLIN HFA) 108 (90 Base) MCG/ACT inhaler Inhale 2 puffs into the lungs every 6 (six) hours as needed for wheezing or shortness of breath. 04/09/16  Yes Copland, Gay Filler, MD  amLODipine (NORVASC) 10 MG tablet Take 1 tablet (10 mg total) by mouth daily. At bedtime 01/20/19  Yes Larey Dresser, MD  aspirin EC 81 MG tablet Take 1 tablet (81 mg total) by mouth daily. 04/20/16  Yes Larey Dresser, MD  carvedilol (COREG) 3.125 MG tablet Take 1 tablet (3.125 mg total) by mouth 2 (two) times daily with a meal. 02/24/19  Yes Larey Dresser, MD  cholecalciferol (VITAMIN D3) 25 MCG (1000 UT) tablet Take 2,000 Units by mouth daily.   Yes [provider]  clonazePAM (KLONOPIN) 0.5 MG tablet TAKE 1 TABLET BY MOUTH AT BEDTIME. May take BID if needed on occasion Patient taking differently: Take 0.5 mg by mouth See admin instructions. TAKE 1 TABLET BY MOUTH AT BEDTIME. May take BID if needed on occasion 02/02/19  Yes Copland, Gay Filler, MD  Cyanocobalamin (VITAMIN B 12 PO) Take 1 tablet by mouth daily.   Yes [provider]  cycloSPORINE (RESTASIS) 0.05 % ophthalmic emulsion Place 1 drop into both eyes 2 (two) times daily.    Yes [provider]  famotidine (PEPCID) 20 MG tablet TAKE 1 TABLET(20 MG) BY MOUTH AT BEDTIME Patient taking differently: Take 20 mg by mouth daily.  09/22/18  Yes Copland, Gay Filler, MD  folic acid (FOLVITE) 1 MG tablet TAKE 2 TABLETS(2 MG) BY MOUTH DAILY Patient taking differently: Take 2 mg by mouth daily.  03/09/19  Yes Deveshwar, Abel Presto, MD  hydrochlorothiazide (HYDRODIURIL) 25 MG tablet Take 12.5 mg by mouth daily.  11/17/18  Yes [provider]  ibuprofen (ADVIL,MOTRIN) 200 MG tablet Take 600 mg by mouth 2 (two) times daily as needed for mild pain.   Yes [provider]  irbesartan (AVAPRO) 300 MG tablet Take 1 tablet (300 mg total) by mouth daily. 06/04/18  Yes Larey Dresser, MD  leflunomide  (ARAVA) 10 MG tablet Take 1 tablet (10 mg total) by mouth alternating with 2 tablets (20 mg total) by mouth every other day. Patient taking differently: Take 10-15 mg by mouth See admin instructions. Take 1 tablet (10 mg total) by mouth alternating with 1.5 tablets (15 mg total) by mouth every other day. 03/25/19  Yes Ofilia Neas, PA-C  methotrexate 50 MG/2ML injection ADMINISTER 0.4 ML(10  MG) UNDER THE SKIN 1 TIME A WEEK Patient taking differently: Inject 10 mg into the skin once a week.  02/23/19  Yes Deveshwar, Abel Presto, MD  Multiple Vitamins-Minerals (PRESERVISION AREDS) TABS Take 1 tablet by mouth 2 (two) times daily.   Yes [provider]  predniSONE (DELTASONE) 5 MG tablet Take 2 tablets (10 mg total) by mouth daily x1wk, 1.5 tablets (7.5 mg total) by mouth daily x1wk, 1 tablet (5 mg total) by mouth daily x1wk, 1/2 tablet (2.5 mg total) by mouth daily x1wk. 03/25/19  Yes Ofilia Neas, PA-C  rosuvastatin (CRESTOR) 10 MG tablet Take 15mg  on Monday, Wednesday and Friday, 10mg  all other days. Patient taking differently: Take 10 mg by mouth daily.  11/26/18  Yes Larey Dresser, MD  sertraline (ZOLOFT) 100 MG tablet Take 2 tablets (200 mg total) by mouth daily. 02/24/19  Yes Copland, Gay Filler, MD  SYNTHROID 112 MCG tablet TAKE 1 TABLET(112 MCG) BY MOUTH DAILY Patient taking differently: Take 112 mcg by mouth daily.  03/25/19  Yes Copland, Gay Filler, MD  TUBERCULIN SYR 1CC/27GX1/2" (SAFETY-LOK TB SYRINGE 27GX.5") 27G X 1/2" 1 ML MISC USE AS DIRECTED 02/24/19  Yes Deveshwar, Abel Presto, MD  doxycycline (VIBRAMYCIN) 100 MG capsule Take 1 capsule (100 mg total) by mouth 2 (two) times daily. Patient not taking: Reported on 04/04/2019 01/19/19   [name], MD    Physical Exam: Vitals:   04/04/19 0300 04/04/19 0315 04/04/19 0345 04/04/19 0400  BP: 139/71 138/74 (!) 144/74 (!) 145/76  Pulse: 73 80 73 73  Resp: 11 13 14  (!) 9  Temp:      TempSrc:      SpO2: 94% 94% 94% 94%     Constitutional: NAD, calm  Eyes: PERTLA, lids and conjunctivae normal ENMT: Mucous membranes are moist. Posterior pharynx clear of any exudate or lesions.   Neck: normal, supple, no masses, no thyromegaly Respiratory: no wheezing, no crackles. No accessory muscle use.  Cardiovascular: S1 & S2 heard, regular rate and rhythm. No extremity edema.   Abdomen: No distension, no tenderness, soft. Bowel sounds normal.  Musculoskeletal: no clubbing / cyanosis. No joint deformity upper and lower extremities.    Skin: no significant rashes, lesions, ulcers. Warm, dry, well-perfused. Neurologic: CN 2-12 grossly intact. Sensation intact. Strength 5/5 in all 4 limbs.  Psychiatric: Alert and oriented to person, place, and situation. Pleasant, cooperative.    Labs on Admission: I have personally reviewed following labs and imaging studies  CBC: Recent Labs  Lab 04/04/19 0148  WBC 2.7*  NEUTROABS 1.2*  HGB 12.5  HCT 37.0  MCV 94.9  PLT 528   Basic Metabolic Panel: Recent Labs  Lab 04/04/19 0148  NA 136  K 3.5  CL 103  CO2 21*  GLUCOSE 92  BUN 17  CREATININE 0.80  CALCIUM 9.2   GFR: Estimated Creatinine Clearance: 52.1 mL/min (by C-G formula based on SCr of 0.8 mg/dL). Liver Function Tests: No results for input(s): AST, ALT, ALKPHOS, BILITOT, PROT, ALBUMIN in the last 168 hours. No results for input(s): LIPASE, AMYLASE in the last 168 hours. No results for input(s): AMMONIA in the last 168 hours. Coagulation Profile: No results for input(s): INR, PROTIME in the last 168 hours. Cardiac Enzymes: Recent Labs  Lab 04/04/19 0148  TROPONINI <0.03   BNP (last 3 results) No results for input(s): PROBNP in the last 8760 hours. HbA1C: No results for input(s): HGBA1C in the last 72 hours. CBG: No results for input(s): GLUCAP  in the last 168 hours. Lipid Profile: No results for input(s): CHOL, HDL, LDLCALC, TRIG, CHOLHDL, LDLDIRECT in the last 72 hours. Thyroid Function Tests: No  results for input(s): TSH, T4TOTAL, FREET4, T3FREE, THYROIDAB in the last 72 hours. Anemia Panel: No results for input(s): VITAMINB12, FOLATE, FERRITIN, TIBC, IRON, RETICCTPCT in the last 72 hours. Urine analysis:    Component Value Date/Time   BILIRUBINUR neg 04/12/2015 0842   PROTEINUR neg 04/12/2015 0842   UROBILINOGEN 0.2 04/12/2015 0842   NITRITE neg 04/12/2015 0842   LEUKOCYTESUR small (1+) (A) 04/12/2015 0842   Sepsis Labs: @LABRCNTIP (procalcitonin:4,lacticidven:4) )No results found for this or any previous visit (from the past 240 hour(s)).   Radiological Exams on Admission: Dg Chest 2 View  Result Date: 04/04/2019 CLINICAL DATA:  73 year old female with shortness of breath. EXAM: CHEST - 2 VIEW COMPARISON:  Chest CT dated 12/05/2017 FINDINGS: The lungs are clear. There is no pleural effusion or pneumothorax. The cardiac silhouette is within normal limits. Coronary vascular calcification noted. The aorta is tortuous. Atherosclerotic calcification of the aortic arch. No acute osseous pathology. Osteopenia with thoracolumbar scoliosis. There is atherosclerotic calcification of the abdominal aorta. Right upper quadrant cholecystectomy clips. IMPRESSION: No active cardiopulmonary disease. Electronically Signed   By: Anner Crete M.D.   On: 04/04/2019 02:21    EKG: Independently reviewed. Sinus rhythm, ST-T abnormalities, similar to priors.   Assessment/Plan   1. SOB, chest discomfort  - Presents with acute-onset SOB without cough or fever, has been experiencing fleeting chest pains, and had right calf tenderness  - EKG features ST-T abnormalities that do not appear significantly different than priors  - She had nuc med stress test in 2017 with small mild defect that was not reversible, no ischemic, low-risk study, LV EF 55-65%  - Initial troponin is <0.03  - No acute findings on CXR  - CTA chest is pending, ASA 324 mg, trend troponin, repeat EKG, continue cardiac monitoring,  continue ASA, statin, and beta-blocker    2. Hypertension  - BP at goal  - Continue Norvasc, Coreg, HCTZ, ARB    3. Rheumatoid arthritis  - Continue leflunomide and prednisone    4. Anxiety  - Klonopin as needed   5. Leukopenia  - WBC 2.7 on admission, appears to be chronic, possible from RA medications, culture if febrile   6. Hypothyroidism  - Continue Synthroid    7. Aortic insufficiency; ascending aortic dilatation - Follows with cardiology, planned for repeat CTA in February 2021 and echo in October 2020    PPE: Mask, face shield. Patient wearing mask.  DVT prophylaxis: Lovenox  Code Status: Full  Family Communication: Discussed with patient  Consults called: None Admission status: observation     Vianne Bulls, MD [facility] Pager [phone]  If 7PM-7AM, please contact night-coverage [email or web address] Password [email or web address]  04/04/2019, 4:40 AM

## 2019-04-04 NOTE — ED Triage Notes (Signed)
Pt c/o shortness of breath that started tonight. Denies chest pain or cough. States she has been taking her temp at home and is concerned because tonight it was 95.9 and it is usually 97.

## 2019-04-04 NOTE — ED Notes (Signed)
Pt c/o some sob tonight sl mid chest pain  None now    Alert oriented skin warm and dry  No distress

## 2019-04-04 NOTE — ED Provider Notes (Signed)
[place] EMERGENCY DEPARTMENT Provider Note   CSN: [id number] Arrival date & time: 04/04/19  0107    History   Chief Complaint Chief Complaint  Patient presents with  . Shortness of Breath    HPI Katherine Wang is a 73 y.o. female.     Patient has history of rheumatoid arthritis, aortic insufficiency, mild ascending aortic dilatation, and suspected prior acute pericarditis.  She presents tonight with progressively worsening shortness of breath that she noticed this evening when she tried to go to bed.  This concerned her and she checked her temperature which was 95.9 So she wanted to get checked out.  She states that she has been out to multiple stores and she is worried about coronavirus because she is immunocompromised on methotrexate and prednisone for rheumatoid arthritis.  States she has a cough intermittently productive of clear and white mucus.  This is associated with some sneezing and congestion.  She also has intermittent central chest pain that is chronic for her that comes and goes lasting for several minutes at a time.  She is not having any change in his chronic pain pattern today.  She is not have any chest pain currently.  Her shortness of breath is not worse with exertion.  It is not pleuritic.  She denies any leg pain or leg swelling.  No history of CAD, CHF, asthma or bronchitis.  She has no known coronavirus exposures.  She has an abnormal EKG at baseline with ST depressions that she brought with her.  Her EKG today is unchanged.  Echo in 1/19 showed EF 60-65% with mild-moderate AI, mild AS. CTA chest 2/19 showed 3.9 cm ascending aorta.    Shortness of Breath  Associated symptoms: cough   Associated symptoms: no abdominal pain, no chest pain, no fever, no headaches, no rash and no vomiting     Past Medical History:  Diagnosis Date  . Adenomatous colon polyp 2012  . Allergy   . Anxiety   . Aortic insufficiency   . Arthritis   . Basal cell  carcinoma   . Cataract   . Depression   . Diverticulosis   . Fibromyalgia   . Fibromyalgia   . Hyperlipidemia   . Hypertension   . Macular degeneration   . Melanoma ([place])   . Murmur, heart   . Reflux   . Stroke ([place]) 20 years ago   per pt. mild stroke  . Thyroid disease    hypothroidism     Patient Active Problem List   Diagnosis Date Noted  . Moderate aortic regurgitation 11/02/2017  . Lightheadedness 11/02/2017  . Tachycardia 07/26/2017  . Atypical chest pain 07/26/2017  . High risk medications (not anticoagulants) long-term use 01/16/2017  . Baker's cyst of knee, left 12/19/2016  . Pain in both knees 12/19/2016  . Pain of both elbows 12/19/2016  . Malignant melanoma ([place]) 12/19/2016  . Basal cell carcinoma 12/19/2016  . Chronic pansinusitis 08/15/2016  . Deviated septum 08/15/2016  . Epistaxis 08/15/2016  . Hypertrophy, nasal, turbinate 08/15/2016  . Upper airway cough syndrome 04/25/2016  . Hx of adenomatous colonic polyps 06/21/2015  . Hyperlipidemia 11/15/2014  . Rheumatoid arthritis ([place]) 09/01/2014  . Pericarditis 04/24/2011  . Aortic valve disorder 02/14/2009  . ELECTROCARDIOGRAM, ABNORMAL 02/14/2009  . PERNICIOUS ANEMIA 07/10/2007  . DEPRESSION 07/03/2007  . MALAISE AND FATIGUE 07/03/2007  . HYPOTHYROIDISM 04/30/2007  . Essential hypertension 04/30/2007  . Aneurysm of thoracic aorta ([place]) 02/13/2006    Past  Surgical History:  Procedure Laterality Date  . ABDOMINAL HYSTERECTOMY    . APPENDECTOMY    . BASAL CELL CARCINOMA EXCISION     nose   . CARDIAC CATHETERIZATION    . CHOLECYSTECTOMY    . COLONOSCOPY     30 + years ago, unsure of where  . EYE SURGERY    . LASIK Bilateral   . MELANOMA EXCISION    . NASAL SEPTUM SURGERY    . TUBAL LIGATION       OB History   No obstetric history on file.      Home Medications    Prior to Admission medications   Medication Sig Start Date End Date Taking? Authorizing Provider  albuterol (PROVENTIL  HFA;VENTOLIN HFA) 108 (90 Base) MCG/ACT inhaler Inhale 2 puffs into the lungs every 6 (six) hours as needed for wheezing or shortness of breath. 04/09/16   Copland, Gay Filler, MD  amLODipine (NORVASC) 10 MG tablet Take 1 tablet (10 mg total) by mouth daily. At bedtime 01/20/19   Larey Dresser, MD  aspirin EC 81 MG tablet Take 1 tablet (81 mg total) by mouth daily. 04/20/16   Larey Dresser, MD  carvedilol (COREG) 3.125 MG tablet Take 1 tablet (3.125 mg total) by mouth 2 (two) times daily with a meal. 02/24/19   Larey Dresser, MD  clonazePAM (KLONOPIN) 0.5 MG tablet TAKE 1 TABLET BY MOUTH AT BEDTIME. May take BID if needed on occasion 02/02/19   Copland, Gay Filler, MD  Cyanocobalamin (VITAMIN B 12 PO) Take 1 tablet by mouth daily.    [provider]  cycloSPORINE (RESTASIS) 0.05 % ophthalmic emulsion 1 drop 2 (two) times daily.    [provider]  doxycycline (VIBRAMYCIN) 100 MG capsule Take 1 capsule (100 mg total) by mouth 2 (two) times daily. 01/19/19   Copland, Gay Filler, MD  famotidine (PEPCID) 20 MG tablet TAKE 1 TABLET(20 MG) BY MOUTH AT BEDTIME 09/22/18   Copland, Gay Filler, MD  folic acid (FOLVITE) 1 MG tablet TAKE 2 TABLETS(2 MG) BY MOUTH DAILY 03/09/19   Bo Merino, MD  hydrochlorothiazide (HYDRODIURIL) 25 MG tablet TK 1 T PO QD 11/17/18   [provider]  ibuprofen (ADVIL,MOTRIN) 200 MG tablet Take 600 mg by mouth 2 (two) times daily as needed for mild pain.    [provider]  irbesartan (AVAPRO) 300 MG tablet Take 1 tablet (300 mg total) by mouth daily. 06/04/18   Larey Dresser, MD  leflunomide (ARAVA) 10 MG tablet Take 1 tablet (10 mg total) by mouth alternating with 2 tablets (20 mg total) by mouth every other day. 03/25/19   Ofilia Neas, PA-C  methotrexate 50 MG/2ML injection ADMINISTER 0.4 ML(10 MG) UNDER THE SKIN 1 TIME A WEEK 02/23/19   Bo Merino, MD  Multiple Vitamins-Minerals (PRESERVISION AREDS) TABS Take 1 tablet by mouth 2  (two) times daily.    [provider]  predniSONE (DELTASONE) 5 MG tablet Take 2 tablets (10 mg total) by mouth daily x1wk, 1.5 tablets (7.5 mg total) by mouth daily x1wk, 1 tablet (5 mg total) by mouth daily x1wk, 1/2 tablet (2.5 mg total) by mouth daily x1wk. 03/25/19   Ofilia Neas, PA-C  rosuvastatin (CRESTOR) 10 MG tablet Take 15mg  on Monday, Wednesday and Friday, 10mg  all other days. 11/26/18   Larey Dresser, MD  sertraline (ZOLOFT) 100 MG tablet Take 2 tablets (200 mg total) by mouth daily. 02/24/19   Copland, Gay Filler, MD  SYNTHROID 112 MCG tablet TAKE 1 TABLET(112 MCG) BY MOUTH DAILY 03/25/19   Copland, Gay Filler, MD  TUBERCULIN SYR 1CC/27GX1/2" (SAFETY-LOK TB SYRINGE 27GX.5") 27G X 1/2" 1 ML MISC USE AS DIRECTED 02/24/19   Bo Merino, MD    Family History Family History  Problem Relation Age of Onset  . Heart disease Mother   . Hypertension Mother   . Mental illness Mother   . Allergies Mother   . Heart disease Father   . Hypertension Father   . Mental illness Father   . Emphysema Father        smoked  . Allergies Father   . Heart attack Father   . Hyperlipidemia Sister   . Hypertension Sister   . Allergies Sister   . Hypertension Sister   . Diabetes Sister   . Allergies Sister   . Epilepsy Son   . Aneurysm Son   . Hyperthyroidism Daughter   . Hypothyroidism Daughter   . Hypertension Daughter   . Colon cancer Neg Hx   . Esophageal cancer Neg Hx   . Rectal cancer Neg Hx   . Stomach cancer Neg Hx   . Pancreatic cancer Neg Hx   . Prostate cancer Neg Hx     Social History Social History   Tobacco Use  . Smoking status: Former Smoker    Packs/day: 2.00    Years: 20.00    Pack years: 40.00    Types: Cigarettes    Last attempt to quit: 10/30/1983    Years since quitting: 35.4  . Smokeless tobacco: Never Used  Substance Use Topics  . Alcohol use: No    Alcohol/week: 0.0 standard drinks  . Drug use: Never     Allergies   Tramadol and  Statins   Review of Systems Review of Systems  Constitutional: Negative for activity change, appetite change and fever.  HENT: Negative for congestion.   Eyes: Negative for photophobia.  Respiratory: Positive for cough, chest tightness and shortness of breath.   Cardiovascular: Negative for chest pain and leg swelling.  Gastrointestinal: Negative for abdominal pain, nausea and vomiting.  Genitourinary: Negative for dysuria and hematuria.  Musculoskeletal: Negative for arthralgias and myalgias.  Skin: Negative for rash.  Neurological: Negative for dizziness, weakness, light-headedness and headaches.    all other systems are negative except as noted in the HPI and PMH.    Physical Exam Updated Vital Signs BP (!) 164/86 (BP Location: Right Arm)   Pulse 85   Temp 97.6 F (36.4 C) (Oral)   Resp 20   SpO2 100%   Physical Exam Vitals signs and nursing note reviewed.  Constitutional:      General: She is not in acute distress.    Appearance: She is well-developed.  HENT:     Head: Normocephalic and atraumatic.     Nose: Nose normal. No rhinorrhea.     Mouth/Throat:     Mouth: Mucous membranes are moist.     Pharynx: No oropharyngeal exudate.  Eyes:     Conjunctiva/sclera: Conjunctivae normal.     Pupils: Pupils are equal, round, and reactive to light.  Neck:     Musculoskeletal: Normal range of motion and neck supple.     Comments: No meningismus. Cardiovascular:     Rate and Rhythm: Normal rate and regular rhythm.     Heart sounds: Normal heart sounds. No murmur.  Pulmonary:     Effort: Pulmonary effort is normal. No respiratory distress.     Breath  sounds: Normal breath sounds.  Chest:     Chest wall: No tenderness.  Abdominal:     Palpations: Abdomen is soft.     Tenderness: There is no abdominal tenderness. There is no guarding or rebound.  Musculoskeletal: Normal range of motion.        General: No tenderness.  Skin:    General: Skin is warm.  Neurological:      Mental Status: She is alert and oriented to person, place, and time.     Cranial Nerves: No cranial nerve deficit.     Motor: No abnormal muscle tone.     Coordination: Coordination normal.     Comments:  5/5 strength throughout. CN 2-12 intact.Equal grip strength.   Psychiatric:        Behavior: Behavior normal.      ED Treatments / Results  Labs (all labs ordered are listed, but only abnormal results are displayed) Labs Reviewed  BASIC METABOLIC PANEL - Abnormal; Notable for the following components:      Result Value   CO2 21 (*)    All other components within normal limits  CBC - Abnormal; Notable for the following components:   WBC 2.7 (*)    RDW 15.8 (*)    All other components within normal limits  D-DIMER, QUANTITATIVE (NOT AT [facility]) - Abnormal; Notable for the following components:   D-Dimer, Quant 0.71 (*)    All other components within normal limits  DIFFERENTIAL - Abnormal; Notable for the following components:   Neutro Abs 1.2 (*)    All other components within normal limits  SARS CORONAVIRUS 2 (HOSPITAL ORDER, [place] [facility])  TROPONIN I    EKG EKG Interpretation  Date/Time:  Saturday April 04 2019 01:20:59 EDT Ventricular Rate:  81 PR Interval:  140 QRS Duration: 88 QT Interval:  364 QTC Calculation: 422 R Axis:   29 Text Interpretation:  Normal sinus rhythm ST & T wave abnormality, consider inferior ischemia ST & T wave abnormality, consider anterolateral ischemia Abnormal ECG worsening ST depressions inferiorly and laterally  similar to 2017 Reconfirmed by Ezequiel Essex [phone]) on 04/04/2019 1:55:40 AM   Radiology Dg Chest 2 View  Result Date: 04/04/2019 CLINICAL DATA:  73 year old female with shortness of breath. EXAM: CHEST - 2 VIEW COMPARISON:  Chest CT dated 12/05/2017 FINDINGS: The lungs are clear. There is no pleural effusion or pneumothorax. The cardiac silhouette is within normal limits. Coronary vascular calcification  noted. The aorta is tortuous. Atherosclerotic calcification of the aortic arch. No acute osseous pathology. Osteopenia with thoracolumbar scoliosis. There is atherosclerotic calcification of the abdominal aorta. Right upper quadrant cholecystectomy clips. IMPRESSION: No active cardiopulmonary disease. Electronically Signed   By: Anner Crete M.D.   On: 04/04/2019 02:21    Procedures Procedures (including critical care time)  Medications Ordered in ED Medications  sodium chloride flush (NS) 0.9 % injection 3 mL (has no administration in time range)     Initial Impression / Assessment and Plan / ED Course  I have reviewed the triage vital signs and the nursing notes.  Pertinent labs & imaging results that were available during my care of the patient were reviewed by me and considered in my medical decision making (see chart for details).       Patient presents with shortness of breath that onset tonight.  She denies any significant cough or fever.  She does have intermittent chest pain that is typical for her and not significantly changed  today.  It is in the center of her chest lasting for several minutes at a time that should not have any currently.  Her EKG shows ST depressions inferolaterally which are chronic and unchanged. She denies any dyspnea with exertion or pleuritic nature of the pain.  Her lungs are clear.  Chest x-ray is normal.  O2 saturation 100%. Initial troponin is negative.  D-dimer is elevated and CT scan will be obtained.  Coronavirus testing will be obtained though low suspicion for this.  Cardiology records reviewed.  Patient's last stress test was in 2017  Heart score is 5.  Description is however atypical for ACS.  EKG is abnormal though unchanged. Dyspnea persists though patient states her intermittent chest pain is at baseline. Concern for possible anginal equivalent.  She is agreeable to observation admission.  D/w Dr. Myna Hidalgo. CTA pending at time of  admission.  Final Clinical Impressions(s) / ED Diagnoses   Final diagnoses:  Dyspnea on exertion    ED Discharge Orders    None       Jaivon Vanbeek, Annie Main, MD 04/04/19 [phone]

## 2019-04-04 NOTE — ED Notes (Signed)
Patient transported to X-ray 

## 2019-04-04 NOTE — Discharge Summary (Signed)
Physician Discharge Summary  Katherine Wang TOI:[id number] DOB: [birth year] DOA: 04/04/2019  PCP: [name], MD  Admit date: 04/04/2019 Discharge date: 04/04/2019  Admitted From: home Discharge disposition: home   Recommendations for Outpatient Follow-Up:    Recommend follow-up chest CT in 2-3 months to assess for interval resolution.  Ascending thoracic aorta measures 4.1 cm. Recommend annual imaging followup by CTA or MRA. This recommendation follows 2010 ACCF/AHA/AATS/ACR/ASA/SCA/SCAI/SIR/STS/SVM Guidelines for the Diagnosis and Management of Patients with Thoracic Aortic Disease. Circulation. 2010; 121: I338-S505. Aortic aneurysm NOS (ICD10-I71.9)   Discharge Diagnosis:    Active Problems:   Hypothyroidism   Essential hypertension   Rheumatoid arthritis ([place])   Anxiety   Leukopenia   Pneumonia    Discharge Condition: Improved.  Diet recommendation: Low sodium, heart healthy..  Wound care: None.  Code status: Full.   History of Present Illness:   Katherine Wang is a 73 y.o. female with medical history significant for aortic insufficiency, mild ascending aortic dilatation, rheumatoid arthritis, hypertension, hypothyroidism, and anxiety, now presenting to the emergency department for evaluation of shortness of breath and intermittent chest discomfort.  Patient reports that she has occasional fleeting pain in her mid chest that has been chronic, but more frequent in recent days.  She then developed shortness of breath last night that came on fairly acutely.  She denies any lower extremity swelling, but was experiencing some tenderness in the right calf yesterday.  She had not been feeling febrile, but checked her temperature at home and found it to be 95 F, she was concerned that may be her shortness of breath and abnormal temperature could be related to the novel coronavirus, and so she wanted to be tested for that.  She has not been experiencing nausea or  diaphoresis, denies any sick contacts but has been going out to the store recently.     Hospital Course by Problem:   Chest pain due to pneumonia -IV abx x 1 dose-- change to doxy to finish course -will need close follow up -CE negative -patient feeling better today -COVID negative    Medical Consultants:      Discharge Exam:   Vitals:   04/04/19 0859 04/04/19 1150  BP: (!) 158/86 (!) 146/80  Pulse: 80 80  Resp:  20  Temp:  98 F (36.7 C)  SpO2: 96% 96%   Vitals:   04/04/19 0430 04/04/19 0600 04/04/19 0859 04/04/19 1150  BP: (!) 179/91 (!) 175/76 (!) 158/86 (!) 146/80  Pulse: 72 72 80 80  Resp: 16 20  20   Temp:  98.4 F (36.9 C)  98 F (36.7 C)  TempSrc:  Oral  Oral  SpO2: 97% 99% 96% 96%  Weight:  67.7 kg    Height:  4' 11.75" (1.518 m)      General exam: Appears calm and comfortable.   The results of significant diagnostics from this hospitalization (including imaging, microbiology, ancillary and laboratory) are listed below for reference.     Procedures and Diagnostic Studies:   Dg Chest 2 View  Result Date: 04/04/2019 CLINICAL DATA:  73 year old female with shortness of breath. EXAM: CHEST - 2 VIEW COMPARISON:  Chest CT dated 12/05/2017 FINDINGS: The lungs are clear. There is no pleural effusion or pneumothorax. The cardiac silhouette is within normal limits. Coronary vascular calcification noted. The aorta is tortuous. Atherosclerotic calcification of the aortic arch. No acute osseous pathology. Osteopenia with thoracolumbar scoliosis. There is atherosclerotic calcification of the abdominal aorta.  Right upper quadrant cholecystectomy clips. IMPRESSION: No active cardiopulmonary disease. Electronically Signed   By: Anner Crete M.D.   On: 04/04/2019 02:21   Ct Angio Chest Pe W And/or Wo Contrast  Result Date: 04/04/2019 CLINICAL DATA:  Shortness of breath. Evaluate for pulmonary embolus. EXAM: CT ANGIOGRAPHY CHEST WITH CONTRAST TECHNIQUE: Multidetector  CT imaging of the chest was performed using the standard protocol during bolus administration of intravenous contrast. Multiplanar CT image reconstructions and MIPs were obtained to evaluate the vascular anatomy. CONTRAST:  134mL OMNIPAQUE IOHEXOL 350 MG/ML SOLN COMPARISON:  CT chest 12/05/2017 FINDINGS: Cardiovascular: Heart is mildly enlarged. No pericardial effusion. Ascending thoracic aorta measures 4.1 cm, similar to prior. Aberrant origin of the right subclavian artery. Adequate opacification of the pulmonary arteries. Mild motion artifact limits evaluation. No evidence for intraluminal filling defect to suggest acute pulmonary embolus. Mediastinum/Nodes: No enlarged axillary, mediastinal or hilar lymphadenopathy. Moderate-sized hiatal hernia. Lungs/Pleura: Central airways are patent. Dependent atelectasis within the bilateral lower lobes. Interval development of a 1.3 cm nodular area of consolidation within the lingula (image 71; series 7). 1.1 x 0.6 cm ground-glass nodule within the right upper (image 51; series 7). Ground-glass opacities within the right middle lobe centrally (image 85; series 7). No pleural effusion or pneumothorax. Upper Abdomen: Unremarkable Musculoskeletal: No aggressive or acute appearing osseous lesions. Thoracic spine degenerative changes. Review of the MIP images confirms the above findings. IMPRESSION: 1. No evidence for acute pulmonary embolus. 2. Interval development of 1.1 cm nodular area of consolidation within the lingula which may be infectious/inflammatory in etiology. True underlying nodule not excluded. Recommend follow-up chest CT in 2-3 months to assess for interval resolution. 3. New ground-glass nodule within the right upper lobe which may be infectious/inflammatory in etiology. Recommend attention on follow-up chest CT. 4. Ascending thoracic aorta measures 4.1 cm. Recommend annual imaging followup by CTA or MRA. This recommendation follows 2010  ACCF/AHA/AATS/ACR/ASA/SCA/SCAI/SIR/STS/SVM Guidelines for the Diagnosis and Management of Patients with Thoracic Aortic Disease. Circulation. 2010; 121: I627-O350. Aortic aneurysm NOS (ICD10-I71.9) 5. Aortic Atherosclerosis (ICD10-I70.0). Electronically Signed   By: Lovey Newcomer M.D.   On: 04/04/2019 05:13     Labs:   Basic Metabolic Panel: Recent Labs  Lab 04/04/19 0148  NA 136  K 3.5  CL 103  CO2 21*  GLUCOSE 92  BUN 17  CREATININE 0.80  CALCIUM 9.2   GFR Estimated Creatinine Clearance: 51.8 mL/min (by C-G formula based on SCr of 0.8 mg/dL). Liver Function Tests: No results for input(s): AST, ALT, ALKPHOS, BILITOT, PROT, ALBUMIN in the last 168 hours. No results for input(s): LIPASE, AMYLASE in the last 168 hours. No results for input(s): AMMONIA in the last 168 hours. Coagulation profile No results for input(s): INR, PROTIME in the last 168 hours.  CBC: Recent Labs  Lab 04/04/19 0148  WBC 2.7*  NEUTROABS 1.2*  HGB 12.5  HCT 37.0  MCV 94.9  PLT 215   Cardiac Enzymes: Recent Labs  Lab 04/04/19 0148 04/04/19 0518 04/04/19 1058  TROPONINI <0.03 <0.03 <0.03   BNP: Invalid input(s): POCBNP CBG: No results for input(s): GLUCAP in the last 168 hours. D-Dimer Recent Labs    04/04/19 0148  DDIMER 0.71*   Hgb A1c No results for input(s): HGBA1C in the last 72 hours. Lipid Profile No results for input(s): CHOL, HDL, LDLCALC, TRIG, CHOLHDL, LDLDIRECT in the last 72 hours. Thyroid function studies No results for input(s): TSH, T4TOTAL, T3FREE, THYROIDAB in the last 72 hours.  Invalid input(s): FREET3 Anemia  work up No results for input(s): VITAMINB12, FOLATE, FERRITIN, TIBC, IRON, RETICCTPCT in the last 72 hours. Microbiology Recent Results (from the past 240 hour(s))  SARS Coronavirus 2 (CEPHEID - Performed in [place] hospital lab), Hosp Order     Status: None   Collection Time: 04/04/19  4:00 AM  Result Value Ref Range Status   SARS Coronavirus 2  NEGATIVE NEGATIVE Final    Comment: (NOTE) If result is NEGATIVE SARS-CoV-2 target nucleic acids are NOT DETECTED. The SARS-CoV-2 RNA is generally detectable in upper and lower  respiratory specimens during the acute phase of infection. The lowest  concentration of SARS-CoV-2 viral copies this assay can detect is 250  copies / mL. A negative result does not preclude SARS-CoV-2 infection  and should not be used as the sole basis for treatment or other  patient management decisions.  A negative result may occur with  improper specimen collection / handling, submission of specimen other  than nasopharyngeal swab, presence of viral mutation(s) within the  areas targeted by this assay, and inadequate number of viral copies  (<250 copies / mL). A negative result must be combined with clinical  observations, patient history, and epidemiological information. If result is POSITIVE SARS-CoV-2 target nucleic acids are DETECTED. The SARS-CoV-2 RNA is generally detectable in upper and lower  respiratory specimens dur ing the acute phase of infection.  Positive  results are indicative of active infection with SARS-CoV-2.  Clinical  correlation with patient history and other diagnostic information is  necessary to determine patient infection status.  Positive results do  not rule out bacterial infection or co-infection with other viruses. If result is PRESUMPTIVE POSTIVE SARS-CoV-2 nucleic acids MAY BE PRESENT.   A presumptive positive result was obtained on the submitted specimen  and confirmed on repeat testing.  While 2019 novel coronavirus  (SARS-CoV-2) nucleic acids may be present in the submitted sample  additional confirmatory testing may be necessary for epidemiological  and / or clinical management purposes  to differentiate between  SARS-CoV-2 and other Sarbecovirus currently known to infect humans.  If clinically indicated additional testing with an alternate test  methodology [phone])  is advised. The SARS-CoV-2 RNA is generally  detectable in upper and lower respiratory sp ecimens during the acute  phase of infection. The expected result is Negative. Fact Sheet for Patients:  StrictlyIdeas.no Fact Sheet for Healthcare Providers: BankingDealers.co.za This test is not yet approved or cleared by the [place] FDA and has been authorized for detection and/or diagnosis of SARS-CoV-2 by FDA under an Emergency Use Authorization (EUA).  This EUA will remain in effect (meaning this test can be used) for the duration of the COVID-19 declaration under Section 564(b)(1) of the Act, 21 U.S.C. section 360bbb-3(b)(1), unless the authorization is terminated or revoked sooner. Performed at [facility], [place] [address] [place],  [address]      Discharge Instructions:   Discharge Instructions    Diet - low sodium heart healthy   Complete by:  As directed    Discharge instructions   Complete by:  As directed    Pick up a pulse oximeter and thermometer (both currently available on amazon)   Increase activity slowly   Complete by:  As directed      Allergies as of 04/04/2019      Reactions   Tramadol Other (See Comments)   dizzy   Statins    Body ache      Medication List  TAKE these medications   albuterol 108 (90 Base) MCG/ACT inhaler Commonly known as:  VENTOLIN HFA Inhale 2 puffs into the lungs every 6 (six) hours as needed for wheezing or shortness of breath.   amLODipine 10 MG tablet Commonly known as:  NORVASC Take 1 tablet (10 mg total) by mouth daily. At bedtime Notes to patient:  6/7   aspirin EC 81 MG tablet Take 1 tablet (81 mg total) by mouth daily. Notes to patient:  6/7   carvedilol 3.125 MG tablet Commonly known as:  COREG Take 1 tablet (3.125 mg total) by mouth 2 (two) times daily with a meal. Notes to patient:  6/6   cholecalciferol 25 MCG (1000 UT) tablet Commonly known as:   VITAMIN D3 Take 2,000 Units by mouth daily.   clonazePAM 0.5 MG tablet Commonly known as:  KLONOPIN TAKE 1 TABLET BY MOUTH AT BEDTIME. May take BID if needed on occasion What changed:    how much to take  how to take this  when to take this   cycloSPORINE 0.05 % ophthalmic emulsion Commonly known as:  RESTASIS Place 1 drop into both eyes 2 (two) times daily. Notes to patient:  6/6   doxycycline 100 MG capsule Commonly known as:  VIBRAMYCIN Take 1 capsule (100 mg total) by mouth 2 (two) times daily. Start taking on:  April 05, 2019 Notes to patient:  6/7   famotidine 20 MG tablet Commonly known as:  PEPCID TAKE 1 TABLET(20 MG) BY MOUTH AT BEDTIME What changed:  See the new instructions. Notes to patient:  6/7   folic acid 1 MG tablet Commonly known as:  FOLVITE TAKE 2 TABLETS(2 MG) BY MOUTH DAILY What changed:  See the new instructions. Notes to patient:  6/7   hydrochlorothiazide 25 MG tablet Commonly known as:  HYDRODIURIL Take 12.5 mg by mouth daily. Notes to patient:  6/7   ibuprofen 200 MG tablet Commonly known as:  ADVIL Take 600 mg by mouth 2 (two) times daily as needed for mild pain.   irbesartan 300 MG tablet Commonly known as:  AVAPRO Take 1 tablet (300 mg total) by mouth daily. Notes to patient:  6/7   leflunomide 10 MG tablet Commonly known as:  ARAVA Take 1 tablet (10 mg total) by mouth alternating with 2 tablets (20 mg total) by mouth every other day. What changed:    how much to take  how to take this  when to take this  additional instructions Notes to patient:  6/7   methotrexate 50 MG/2ML injection ADMINISTER 0.4 ML(10 MG) UNDER THE SKIN 1 TIME A WEEK What changed:  See the new instructions.   predniSONE 5 MG tablet Commonly known as:  DELTASONE Take 2 tablets (10 mg total) by mouth daily x1wk, 1.5 tablets (7.5 mg total) by mouth daily x1wk, 1 tablet (5 mg total) by mouth daily x1wk, 1/2 tablet (2.5 mg total) by mouth daily  x1wk. Notes to patient:  6/7   PreserVision AREDS Tabs Take 1 tablet by mouth 2 (two) times daily.   rosuvastatin 10 MG tablet Commonly known as:  CRESTOR Take 1 tablet (10 mg total) by mouth daily.   sertraline 100 MG tablet Commonly known as:  ZOLOFT Take 2 tablets (200 mg total) by mouth daily. Notes to patient:  6/7   Synthroid 112 MCG tablet Generic drug:  levothyroxine TAKE 1 TABLET(112 MCG) BY MOUTH DAILY What changed:  See the new instructions. Notes to patient:  6/7  TUBERCULIN SYR 1CC/27GX1/2" 27G X 1/2" 1 ML Misc Commonly known as:  SAFETY-LOK TB SYRINGE 27GX.5" USE AS DIRECTED   VITAMIN B 12 PO Take 1 tablet by mouth daily.      Follow-up Information    Copland, Gay Filler, MD Follow up in 1 week(s).   Specialty:  Family Medicine Contact information: [place] [address] [phone]        Nelva Bush, MD .   Specialty:  Cardiology Contact information: [place] [address] [phone]            Time coordinating discharge: 25 min  Signed:  Geradine Girt DO  [facility] 04/04/2019, 4:02 PM

## 2019-04-04 NOTE — Discharge Instructions (Signed)
After treatment of your pneumonia, you will need follow up imaging to ensure resolution (likely in 2-3 months)

## 2019-04-04 NOTE — Progress Notes (Signed)
Arrived to room from ed via stretcher. Ambulated steadily to bed.

## 2019-04-06 ENCOUNTER — Telehealth: Payer: Self-pay | Admitting: *Deleted

## 2019-04-06 NOTE — Telephone Encounter (Signed)
Transition Care Management Follow-up Telephone Call   Date discharged?04/04/19   How have you been since you were released from the hospital? "feeling better. Still a little weak"   Do you understand why you were in the hospital? yes   Do you understand the discharge instructions? yes   Where were you discharged to? home   Items Reviewed:  Medications reviewed:pt reports they added doxycycline  Allergies reviewed: yes  Dietary changes reviewed: yes  Referrals reviewed: yes   Functional Questionnaire:   Activities of Daily Living (ADLs):   She states they are independent in the following: ambulation, bathing and hygiene, feeding, continence, grooming, toileting and dressing States they require assistance with the following: n/a   Any transportation issues/concerns?: no   Any patient concerns? no   Confirmed importance and date/time of follow-up visits scheduled yes  Provider Appointment booked with PCP 04/08/19  Confirmed with patient if condition begins to worsen call PCP or go to the ER.  Patient was given the office number and encouraged to call back with question or concerns.  : yes

## 2019-04-07 NOTE — Progress Notes (Signed)
[name] at [facility] [address] [place], [place] [address] [phone]  Date:  04/08/2019   Name:  Katherine Wang   DOB:  04-21-1946   MRN:  [id number]  PCP:  Katherine Mclean, MD    Chief Complaint: No chief complaint on file.   History of Present Illness:  Katherine Wang is a 73 y.o. very pleasant female patient who presents with the following:  Here today for virtual hospital follow-up visit Pt location is home, provider location is home Pt ID confirmed with 2 factors, she gives consent for virtual visit today Katherine Wang has history of rheumatoid arthritis, hypothyroidism, hypertension, aortic valve disorder, ascending thoracic aortic aneurysm  She presented to the emergency department last weekend with shortness of breath and chest discomfort.  She was diagnosed with pneumonia, was treated with IV antibiotics and released with doxycycline. She did have a coronavirus test while inpatient which was negative Inpatient team recommended a follow-up CT of her chest in 2 to 3 months to ensure resolution of pneumonia/eval lung nodule and annual follow-up of her thoracic aorta by CTA or MRA However Dr. Aundra Wang is already aware of her aorta issue- per his note from April he plans a follow-up CTA in February 2021  CT angio 04/04/19: IMPRESSION: 1. No evidence for acute pulmonary embolus. 2. Interval development of 1.1 cm nodular area of consolidation within the lingula which may be infectious/inflammatory in etiology. True underlying nodule not excluded. Recommend follow-up chest CT in 2-3 months to assess for interval resolution. 3. New ground-glass nodule within the right upper lobe which may be infectious/inflammatory in etiology. Recommend attention on follow-up chest CT. 4. Ascending thoracic aorta measures 4.1 cm. Recommend annual imaging followup by CTA or MRA. This recommendation follows  2010 ACCF/AHA/AATS/ACR/ASA/SCA/SCAI/SIR/STS/SVM Guidelines for the Diagnosis and Management of Patients with Thoracic Aortic Disease. Circulation. 2010; 121: F749-S496. Aortic aneurysm NOS (ICD10-I71.9) 5. Aortic Atherosclerosis (ICD10-I70.0).  She is improving since she got home, and is taking her doxycycline.   She has not noted any fever  She does cough some at baseline, this is a bit worse with current illness Mostly a dry cough, occasionally productive Her back may hurt in the am but it gets better as she gets moving appetite is ok Energy level is still reduced   Her rheum asked her to hold methotrexate for now- she was advised to see me for a recheck prior to going back on MTX. They are monitoring her wbc count- she has leukopenia due to her rhem meds, white blood cell count was 2700 in the ER.  Other cell lines are normal Patient Active Problem List   Diagnosis Date Noted  . Chest pain 04/04/2019  . Anxiety 04/04/2019  . Leukopenia 04/04/2019  . Pneumonia 04/04/2019  . Dyspnea on exertion   . Moderate aortic regurgitation 11/02/2017  . Lightheadedness 11/02/2017  . Tachycardia 07/26/2017  . Atypical chest pain 07/26/2017  . High risk medications (not anticoagulants) long-term use 01/16/2017  . Baker's cyst of knee, left 12/19/2016  . Pain in both knees 12/19/2016  . Pain of both elbows 12/19/2016  . Malignant melanoma ([place]) 12/19/2016  . Basal cell carcinoma 12/19/2016  . Chronic pansinusitis 08/15/2016  . Deviated septum 08/15/2016  . Epistaxis 08/15/2016  . Hypertrophy, nasal, turbinate 08/15/2016  . Upper airway cough syndrome 04/25/2016  . Hx of adenomatous colonic polyps 06/21/2015  . Hyperlipidemia 11/15/2014  . Rheumatoid arthritis ([place]) 09/01/2014  .  Pericarditis 04/24/2011  . Aortic valve disorder 02/14/2009  . ELECTROCARDIOGRAM, ABNORMAL 02/14/2009  . PERNICIOUS ANEMIA 07/10/2007  . DEPRESSION 07/03/2007  . MALAISE AND FATIGUE 07/03/2007  .  Hypothyroidism 04/30/2007  . Essential hypertension 04/30/2007  . Aneurysm of thoracic aorta ([place]) 02/13/2006    Past Medical History:  Diagnosis Date  . Adenomatous colon polyp 2012  . Allergy   . Anxiety   . Aortic insufficiency   . Arthritis   . Basal cell carcinoma   . Cataract   . Depression   . Diverticulosis   . Fibromyalgia   . Fibromyalgia   . Hyperlipidemia   . Hypertension   . Macular degeneration   . Melanoma ([place])   . Murmur, heart   . Reflux   . Stroke ([place]) 20 years ago   per pt. mild stroke  . Thyroid disease    hypothroidism     Past Surgical History:  Procedure Laterality Date  . ABDOMINAL HYSTERECTOMY    . APPENDECTOMY    . BASAL CELL CARCINOMA EXCISION     nose   . CARDIAC CATHETERIZATION    . CHOLECYSTECTOMY    . COLONOSCOPY     30 + years ago, unsure of where  . EYE SURGERY    . LASIK Bilateral   . MELANOMA EXCISION    . NASAL SEPTUM SURGERY    . TUBAL LIGATION      Social History   Tobacco Use  . Smoking status: Former Smoker    Packs/day: 2.00    Years: 20.00    Pack years: 40.00    Types: Cigarettes    Last attempt to quit: 10/30/1983    Years since quitting: 35.4  . Smokeless tobacco: Never Used  Substance Use Topics  . Alcohol use: No    Alcohol/week: 0.0 standard drinks  . Drug use: Never    Family History  Problem Relation Age of Onset  . Heart disease Mother   . Hypertension Mother   . Mental illness Mother   . Allergies Mother   . Heart disease Father   . Hypertension Father   . Mental illness Father   . Emphysema Father        smoked  . Allergies Father   . Heart attack Father   . Hyperlipidemia Sister   . Hypertension Sister   . Allergies Sister   . Hypertension Sister   . Diabetes Sister   . Allergies Sister   . Epilepsy Son   . Aneurysm Son   . Hyperthyroidism Daughter   . Hypothyroidism Daughter   . Hypertension Daughter   . Colon cancer Neg Hx   . Esophageal cancer Neg Hx   . Rectal cancer Neg  Hx   . Stomach cancer Neg Hx   . Pancreatic cancer Neg Hx   . Prostate cancer Neg Hx     Allergies  Allergen Reactions  . Tramadol Other (See Comments)    dizzy  . Statins     Body ache    Medication list has been reviewed and updated.  Current Outpatient Medications on File Prior to Visit  Medication Sig Dispense Refill  . albuterol (PROVENTIL HFA;VENTOLIN HFA) 108 (90 Base) MCG/ACT inhaler Inhale 2 puffs into the lungs every 6 (six) hours as needed for wheezing or shortness of breath. 18 g 2  . amLODipine (NORVASC) 10 MG tablet Take 1 tablet (10 mg total) by mouth daily. At bedtime 90 tablet 3  . aspirin EC 81 MG tablet  Take 1 tablet (81 mg total) by mouth daily. 90 tablet 3  . carvedilol (COREG) 3.125 MG tablet Take 1 tablet (3.125 mg total) by mouth 2 (two) times daily with a meal. 180 tablet 3  . cholecalciferol (VITAMIN D3) 25 MCG (1000 UT) tablet Take 2,000 Units by mouth daily.    . clonazePAM (KLONOPIN) 0.5 MG tablet TAKE 1 TABLET BY MOUTH AT BEDTIME. May take BID if needed on occasion (Patient taking differently: Take 0.5 mg by mouth See admin instructions. TAKE 1 TABLET BY MOUTH AT BEDTIME. May take BID if needed on occasion) 60 tablet 2  . Cyanocobalamin (VITAMIN B 12 PO) Take 1 tablet by mouth daily.    . cycloSPORINE (RESTASIS) 0.05 % ophthalmic emulsion Place 1 drop into both eyes 2 (two) times daily.     Marland Kitchen doxycycline (VIBRAMYCIN) 100 MG capsule Take 1 capsule (100 mg total) by mouth 2 (two) times daily. 10 capsule 0  . famotidine (PEPCID) 20 MG tablet TAKE 1 TABLET(20 MG) BY MOUTH AT BEDTIME (Patient taking differently: Take 20 mg by mouth daily. ) 30 tablet 5  . folic acid (FOLVITE) 1 MG tablet TAKE 2 TABLETS(2 MG) BY MOUTH DAILY (Patient taking differently: Take 2 mg by mouth daily. ) 180 tablet 3  . hydrochlorothiazide (HYDRODIURIL) 25 MG tablet Take 12.5 mg by mouth daily.     Marland Kitchen ibuprofen (ADVIL,MOTRIN) 200 MG tablet Take 600 mg by mouth 2 (two) times daily as  needed for mild pain.    Marland Kitchen irbesartan (AVAPRO) 300 MG tablet Take 1 tablet (300 mg total) by mouth daily. 30 tablet 11  . leflunomide (ARAVA) 10 MG tablet Take 1 tablet (10 mg total) by mouth alternating with 2 tablets (20 mg total) by mouth every other day. (Patient taking differently: Take 10-15 mg by mouth See admin instructions. Take 1 tablet (10 mg total) by mouth alternating with 1.5 tablets (15 mg total) by mouth every other day.) 45 tablet 2  . methotrexate 50 MG/2ML injection ADMINISTER 0.4 ML(10 MG) UNDER THE SKIN 1 TIME A WEEK (Patient taking differently: Inject 10 mg into the skin once a week. ) 5 mL 0  . Multiple Vitamins-Minerals (PRESERVISION AREDS) TABS Take 1 tablet by mouth 2 (two) times daily.    . predniSONE (DELTASONE) 5 MG tablet Take 2 tablets (10 mg total) by mouth daily x1wk, 1.5 tablets (7.5 mg total) by mouth daily x1wk, 1 tablet (5 mg total) by mouth daily x1wk, 1/2 tablet (2.5 mg total) by mouth daily x1wk. 35 tablet 0  . rosuvastatin (CRESTOR) 10 MG tablet Take 1 tablet (10 mg total) by mouth daily.    . sertraline (ZOLOFT) 100 MG tablet Take 2 tablets (200 mg total) by mouth daily. 180 tablet 0  . SYNTHROID 112 MCG tablet TAKE 1 TABLET(112 MCG) BY MOUTH DAILY (Patient taking differently: Take 112 mcg by mouth daily. ) 30 tablet 6  . TUBERCULIN SYR 1CC/27GX1/2" (SAFETY-LOK TB SYRINGE 27GX.5") 27G X 1/2" 1 ML MISC USE AS DIRECTED 12 each 3   No current facility-administered medications on file prior to visit.     Review of Systems:  As per HPI- otherwise negative. No fever.  Recovering from pneumonia  Physical Examination: There were no vitals filed for this visit. There were no vitals filed for this visit. There is no height or weight on file to calculate BMI. Ideal Body Weight:    Pt observed on video today and looks well  No cough or wheezing, no  SOB noted  She had been checking her home BP but stopped as it was always ok  BP Readings from Last 3  Encounters:  04/04/19 (!) 146/80  03/25/19 115/77  02/12/19 (!) 150/83    Assessment and Plan: Hospital discharge follow-up  Community acquired pneumonia, unspecified laterality  Lung nodule  Solitary pulmonary nodule - Plan: [facility] follow-up visit today.  Patient was briefly admitted with a community-acquired pneumonia last week.  She is doing better on oral antibiotics I spoke with radiologist who read her recent CT angiogram, he recommended a noncontrasted CT for follow-up; ordered today Follow-up plan for thoracic aorta is already in place We scheduled in person visit for next week, so I can repeat her CBC.  Need to monitor her leukopenia, so that she can restart methotrexate  Signed Lamar Blinks, MD

## 2019-04-08 ENCOUNTER — Encounter: Payer: Self-pay | Admitting: Family Medicine

## 2019-04-08 ENCOUNTER — Other Ambulatory Visit: Payer: Self-pay

## 2019-04-08 ENCOUNTER — Ambulatory Visit (INDEPENDENT_AMBULATORY_CARE_PROVIDER_SITE_OTHER): Payer: Medicare Other | Admitting: Family Medicine

## 2019-04-08 ENCOUNTER — Telehealth: Payer: Self-pay | Admitting: Rheumatology

## 2019-04-08 DIAGNOSIS — J189 Pneumonia, unspecified organism: Secondary | ICD-10-CM

## 2019-04-08 DIAGNOSIS — R911 Solitary pulmonary nodule: Secondary | ICD-10-CM

## 2019-04-08 DIAGNOSIS — Z09 Encounter for follow-up examination after completed treatment for conditions other than malignant neoplasm: Secondary | ICD-10-CM

## 2019-04-08 NOTE — Telephone Encounter (Signed)
Patient had lab work while hospitalized on 04/04/19.  WBC count was low.  Please advise patient to return for lab work in 2 weeks.   Advise patient to hold MTX and Arava until she has completed antibiotics and has been cleared by PCP to restart.

## 2019-04-08 NOTE — Telephone Encounter (Signed)
Patient advised shehad lab work while hospitalized on 04/04/19.  WBC count was low.  Patient advised to return for lab work in 2 weeks.   Advised patient to hold MTX and Arava until she has completed antibiotics and has been cleared by PCP to restart. Patient verbalized understanding.

## 2019-04-08 NOTE — Telephone Encounter (Signed)
Patient calling because she was in the hospital for Pneumonia this weekend. Patient was sent home with antibiotics. Patient is due for MTX injection today, but does not know if she should take it or not. Please call to advise.

## 2019-04-08 NOTE — Telephone Encounter (Signed)
Patient states she was in the hospital over the weekend with Pneumonia. Patient states she is on antibiotics. Patient is on MTX and Arava. Patient is due her MTX today and advised she should the injection until she has completed the antibiotics and is well. Patient states she was due to come for blood work this week and would like to know if she should postpone it and if so for how long.

## 2019-04-11 NOTE — Progress Notes (Signed)
[place] at [facility] [address], [place], [place] [address] [phone]  Date:  04/13/2019   Name:  Katherine Wang   DOB:  1946/03/13   MRN:  [id number]  PCP:  [name], MD    Chief Complaint: Lab Work (repeat cbc)   History of Present Illness:  Katherine Wang is a 73 y.o. very pleasant female patient who presents with the following:  In person visit today to follow-up from recent brief hospital admission  History of RA, hypothyroidism, HTN Per virtual visit last week:  Hospital follow-up visit today.  Patient was briefly admitted with a community-acquired pneumonia last week.  She is doing better on oral antibiotics I spoke with radiologist who read her recent CT angiogram, he recommended a noncontrasted CT for follow-up; ordered today Follow-up plan for thoracic aorta is already in place We scheduled in person visit for next week, so I can repeat her CBC.  Need to monitor her leukopenia, so that she can restart methotrexate  She has noted some aching in her upper back the last 3-4 days. She will then cough up some mucus and feel better She finished up the abx a few days ago.   She is concerned that she might get sick again and would like to use the doxy a bit longer if possible.  This is fine No fever We need to repeat her CBC today so she will know when she can go on methotrexate  Overall she does feel like her sx are improving. Breathing is better and her energy level is slowly increasing  Patient Active Problem List   Diagnosis Date Noted  . Chest pain 04/04/2019  . Anxiety 04/04/2019  . Leukopenia 04/04/2019  . Pneumonia 04/04/2019  . Dyspnea on exertion   . Moderate aortic regurgitation 11/02/2017  . Lightheadedness 11/02/2017  . Tachycardia 07/26/2017  . Atypical chest pain 07/26/2017  . High risk medications (not anticoagulants) long-term use 01/16/2017  . Baker's cyst of knee, left 12/19/2016  .  Pain in both knees 12/19/2016  . Pain of both elbows 12/19/2016  . Malignant melanoma ([place]) 12/19/2016  . Basal cell carcinoma 12/19/2016  . Chronic pansinusitis 08/15/2016  . Deviated septum 08/15/2016  . Epistaxis 08/15/2016  . Hypertrophy, nasal, turbinate 08/15/2016  . Upper airway cough syndrome 04/25/2016  . Hx of adenomatous colonic polyps 06/21/2015  . Hyperlipidemia 11/15/2014  . Rheumatoid arthritis ([place]) 09/01/2014  . Pericarditis 04/24/2011  . Aortic valve disorder 02/14/2009  . ELECTROCARDIOGRAM, ABNORMAL 02/14/2009  . PERNICIOUS ANEMIA 07/10/2007  . DEPRESSION 07/03/2007  . MALAISE AND FATIGUE 07/03/2007  . Hypothyroidism 04/30/2007  . Essential hypertension 04/30/2007  . Aneurysm of thoracic aorta ([place]) 02/13/2006    Past Medical History:  Diagnosis Date  . Adenomatous colon polyp 2012  . Allergy   . Anxiety   . Aortic insufficiency   . Arthritis   . Basal cell carcinoma   . Cataract   . Depression   . Diverticulosis   . Fibromyalgia   . Fibromyalgia   . Hyperlipidemia   . Hypertension   . Macular degeneration   . Melanoma ([place])   . Murmur, heart   . Reflux   . Stroke ([place]) 20 years ago   per pt. mild stroke  . Thyroid disease    hypothroidism     Past Surgical History:  Procedure Laterality Date  . ABDOMINAL HYSTERECTOMY    . APPENDECTOMY    .  BASAL CELL CARCINOMA EXCISION     nose   . CARDIAC CATHETERIZATION    . CHOLECYSTECTOMY    . COLONOSCOPY     30 + years ago, unsure of where  . EYE SURGERY    . LASIK Bilateral   . MELANOMA EXCISION    . NASAL SEPTUM SURGERY    . TUBAL LIGATION      Social History   Tobacco Use  . Smoking status: Former Smoker    Packs/day: 2.00    Years: 20.00    Pack years: 40.00    Types: Cigarettes    Quit date: 10/30/1983    Years since quitting: 35.4  . Smokeless tobacco: Never Used  Substance Use Topics  . Alcohol use: No    Alcohol/week: 0.0 standard drinks  . Drug use: Never    Family  History  Problem Relation Age of Onset  . Heart disease Mother   . Hypertension Mother   . Mental illness Mother   . Allergies Mother   . Heart disease Father   . Hypertension Father   . Mental illness Father   . Emphysema Father        smoked  . Allergies Father   . Heart attack Father   . Hyperlipidemia Sister   . Hypertension Sister   . Allergies Sister   . Hypertension Sister   . Diabetes Sister   . Allergies Sister   . Epilepsy Son   . Aneurysm Son   . Hyperthyroidism Daughter   . Hypothyroidism Daughter   . Hypertension Daughter   . Colon cancer Neg Hx   . Esophageal cancer Neg Hx   . Rectal cancer Neg Hx   . Stomach cancer Neg Hx   . Pancreatic cancer Neg Hx   . Prostate cancer Neg Hx     Allergies  Allergen Reactions  . Tramadol Other (See Comments)    dizzy  . Statins     Body ache    Medication list has been reviewed and updated.  Current Outpatient Medications on File Prior to Visit  Medication Sig Dispense Refill  . albuterol (PROVENTIL HFA;VENTOLIN HFA) 108 (90 Base) MCG/ACT inhaler Inhale 2 puffs into the lungs every 6 (six) hours as needed for wheezing or shortness of breath. 18 g 2  . amLODipine (NORVASC) 10 MG tablet Take 1 tablet (10 mg total) by mouth daily. At bedtime 90 tablet 3  . aspirin EC 81 MG tablet Take 1 tablet (81 mg total) by mouth daily. 90 tablet 3  . carvedilol (COREG) 3.125 MG tablet Take 1 tablet (3.125 mg total) by mouth 2 (two) times daily with a meal. 180 tablet 3  . cholecalciferol (VITAMIN D3) 25 MCG (1000 UT) tablet Take 2,000 Units by mouth daily.    . clonazePAM (KLONOPIN) 0.5 MG tablet TAKE 1 TABLET BY MOUTH AT BEDTIME. May take BID if needed on occasion (Patient taking differently: Take 0.5 mg by mouth See admin instructions. TAKE 1 TABLET BY MOUTH AT BEDTIME. May take BID if needed on occasion) 60 tablet 2  . Cyanocobalamin (VITAMIN B 12 PO) Take 1 tablet by mouth daily.    . cycloSPORINE (RESTASIS) 0.05 % ophthalmic  emulsion Place 1 drop into both eyes 2 (two) times daily.     . famotidine (PEPCID) 20 MG tablet TAKE 1 TABLET(20 MG) BY MOUTH AT BEDTIME (Patient taking differently: Take 20 mg by mouth daily. ) 30 tablet 5  . folic acid (FOLVITE) 1 MG tablet TAKE  2 TABLETS(2 MG) BY MOUTH DAILY (Patient taking differently: Take 2 mg by mouth daily. ) 180 tablet 3  . hydrochlorothiazide (HYDRODIURIL) 25 MG tablet Take 12.5 mg by mouth daily.     Marland Kitchen ibuprofen (ADVIL,MOTRIN) 200 MG tablet Take 600 mg by mouth 2 (two) times daily as needed for mild pain.    Marland Kitchen irbesartan (AVAPRO) 300 MG tablet Take 1 tablet (300 mg total) by mouth daily. 30 tablet 11  . leflunomide (ARAVA) 10 MG tablet Take 1 tablet (10 mg total) by mouth alternating with 2 tablets (20 mg total) by mouth every other day. (Patient taking differently: Take 10-15 mg by mouth See admin instructions. Take 1 tablet (10 mg total) by mouth alternating with 1.5 tablets (15 mg total) by mouth every other day.) 45 tablet 2  . methotrexate 50 MG/2ML injection ADMINISTER 0.4 ML(10 MG) UNDER THE SKIN 1 TIME A WEEK (Patient taking differently: Inject 10 mg into the skin once a week. ) 5 mL 0  . Multiple Vitamins-Minerals (PRESERVISION AREDS) TABS Take 1 tablet by mouth 2 (two) times daily.    . predniSONE (DELTASONE) 5 MG tablet Take 2 tablets (10 mg total) by mouth daily x1wk, 1.5 tablets (7.5 mg total) by mouth daily x1wk, 1 tablet (5 mg total) by mouth daily x1wk, 1/2 tablet (2.5 mg total) by mouth daily x1wk. 35 tablet 0  . rosuvastatin (CRESTOR) 10 MG tablet Take 1 tablet (10 mg total) by mouth daily.    . sertraline (ZOLOFT) 100 MG tablet Take 2 tablets (200 mg total) by mouth daily. 180 tablet 0  . SYNTHROID 112 MCG tablet TAKE 1 TABLET(112 MCG) BY MOUTH DAILY (Patient taking differently: Take 112 mcg by mouth daily. ) 30 tablet 6  . TUBERCULIN SYR 1CC/27GX1/2" (SAFETY-LOK TB SYRINGE 27GX.5") 27G X 1/2" 1 ML MISC USE AS DIRECTED 12 each 3   No current  facility-administered medications on file prior to visit.     Review of Systems:  As per HPI- otherwise negative. No fever Occasional cough as above   Physical Examination: Vitals:   04/13/19 1434  BP: 140/80  Pulse: 78  Resp: 17  Temp: 97.8 F (36.6 C)  SpO2: 98%   Vitals:   04/13/19 1434  Weight: 153 lb (69.4 kg)  Height: 4' 11.75" (1.518 m)   Body mass index is 30.13 kg/m. Ideal Body Weight: Weight in (lb) to have BMI = 25: 126.7  GEN: WDWN, NAD, Non-toxic, A & O x 3, looks well  HEENT: Atraumatic, Normocephalic. Neck supple. No masses, No LAD. Ears and Nose: No external deformity. CV: RRR, No M/G/R. No JVD. No thrill. No extra heart sounds. PULM: CTA B, no wheezes, crackles, rhonchi. No retractions. No resp. distress. No accessory muscle use. EXTR: No c/c/e NEURO Normal gait.  PSYCH: Normally interactive. Conversant. Not depressed or anxious appearing.  Calm demeanor.    Assessment and Plan:   ICD-10-CM   1. Community acquired pneumonia, unspecified laterality  J18.9 doxycycline (VIBRAMYCIN) 100 MG capsule    CBC     Follow-up: No follow-ups on file.  Meds ordered this encounter  Medications  . doxycycline (VIBRAMYCIN) 100 MG capsule    Sig: Take 1 capsule (100 mg total) by mouth 2 (two) times daily.    Dispense:  14 capsule    Refill:  0   Orders Placed This Encounter  Procedures  . CBC   Following up from recent CAP admission Needs CBC to follow leukopenia She plans to hold  methotrexate for another week 7 days more of doxy Her repeat CT scan is ordered for august She will let me know if not continuing to feel better     Signed Lamar Blinks, MD

## 2019-04-13 ENCOUNTER — Encounter: Payer: Self-pay | Admitting: Family Medicine

## 2019-04-13 ENCOUNTER — Ambulatory Visit (INDEPENDENT_AMBULATORY_CARE_PROVIDER_SITE_OTHER): Payer: Medicare Other | Admitting: Family Medicine

## 2019-04-13 ENCOUNTER — Other Ambulatory Visit: Payer: Self-pay

## 2019-04-13 VITALS — BP 140/80 | HR 78 | Temp 97.8°F | Resp 17 | Ht 59.75 in | Wt 153.0 lb

## 2019-04-13 DIAGNOSIS — J189 Pneumonia, unspecified organism: Secondary | ICD-10-CM | POA: Diagnosis not present

## 2019-04-13 MED ORDER — DOXYCYCLINE HYCLATE 100 MG PO CAPS: 100.0000 mg | ORAL_CAPSULE | Freq: Two times a day (BID) | ORAL | 0 refills | Status: DC

## 2019-04-13 NOTE — Patient Instructions (Signed)
Good to see you today!   Let me know if you don't hear about your repeat CT by August Otherwise let's do one more week of doxycycline for your lungs Let me know if not continuing to improve

## 2019-04-14 ENCOUNTER — Encounter: Payer: Self-pay | Admitting: Family Medicine

## 2019-04-14 LAB — CBC
HCT: 37 % (ref 36.0–46.0)
Hemoglobin: 12.4 g/dL (ref 12.0–15.0)
MCHC: 33.4 g/dL (ref 30.0–36.0)
MCV: 96.1 fl (ref 78.0–100.0)
Platelets: 201 10*3/uL (ref 150.0–400.0)
RBC: 3.85 Mil/uL — ABNORMAL LOW (ref 3.87–5.11)
RDW: 17.5 % — ABNORMAL HIGH (ref 11.5–15.5)
WBC: 3.7 10*3/uL — ABNORMAL LOW (ref 4.0–10.5)

## 2019-04-21 DIAGNOSIS — H353132 Nonexudative age-related macular degeneration, bilateral, intermediate dry stage: Secondary | ICD-10-CM | POA: Diagnosis not present

## 2019-04-21 DIAGNOSIS — H40003 Preglaucoma, unspecified, bilateral: Secondary | ICD-10-CM | POA: Diagnosis not present

## 2019-04-27 ENCOUNTER — Encounter: Payer: Self-pay | Admitting: Physician Assistant

## 2019-04-27 ENCOUNTER — Ambulatory Visit (INDEPENDENT_AMBULATORY_CARE_PROVIDER_SITE_OTHER): Payer: Medicare Other | Admitting: Physician Assistant

## 2019-04-27 ENCOUNTER — Ambulatory Visit: Payer: Self-pay

## 2019-04-27 ENCOUNTER — Ambulatory Visit (INDEPENDENT_AMBULATORY_CARE_PROVIDER_SITE_OTHER): Payer: Medicare Other

## 2019-04-27 ENCOUNTER — Other Ambulatory Visit: Payer: Self-pay

## 2019-04-27 ENCOUNTER — Telehealth: Payer: Self-pay | Admitting: Rheumatology

## 2019-04-27 VITALS — BP 129/83 | HR 82 | Resp 12 | Ht 59.75 in | Wt 153.2 lb

## 2019-04-27 DIAGNOSIS — Z8709 Personal history of other diseases of the respiratory system: Secondary | ICD-10-CM

## 2019-04-27 DIAGNOSIS — Z79899 Other long term (current) drug therapy: Secondary | ICD-10-CM | POA: Diagnosis not present

## 2019-04-27 DIAGNOSIS — Z8679 Personal history of other diseases of the circulatory system: Secondary | ICD-10-CM

## 2019-04-27 DIAGNOSIS — Z862 Personal history of diseases of the blood and blood-forming organs and certain disorders involving the immune mechanism: Secondary | ICD-10-CM | POA: Diagnosis not present

## 2019-04-27 DIAGNOSIS — Z8639 Personal history of other endocrine, nutritional and metabolic disease: Secondary | ICD-10-CM | POA: Diagnosis not present

## 2019-04-27 DIAGNOSIS — M25562 Pain in left knee: Secondary | ICD-10-CM

## 2019-04-27 DIAGNOSIS — Z8659 Personal history of other mental and behavioral disorders: Secondary | ICD-10-CM

## 2019-04-27 DIAGNOSIS — M0579 Rheumatoid arthritis with rheumatoid factor of multiple sites without organ or systems involvement: Secondary | ICD-10-CM | POA: Diagnosis not present

## 2019-04-27 DIAGNOSIS — M19071 Primary osteoarthritis, right ankle and foot: Secondary | ICD-10-CM | POA: Diagnosis not present

## 2019-04-27 DIAGNOSIS — M25561 Pain in right knee: Secondary | ICD-10-CM | POA: Diagnosis not present

## 2019-04-27 DIAGNOSIS — M19042 Primary osteoarthritis, left hand: Secondary | ICD-10-CM

## 2019-04-27 DIAGNOSIS — M19072 Primary osteoarthritis, left ankle and foot: Secondary | ICD-10-CM

## 2019-04-27 DIAGNOSIS — G8929 Other chronic pain: Secondary | ICD-10-CM

## 2019-04-27 DIAGNOSIS — Z85828 Personal history of other malignant neoplasm of skin: Secondary | ICD-10-CM

## 2019-04-27 DIAGNOSIS — Z8669 Personal history of other diseases of the nervous system and sense organs: Secondary | ICD-10-CM

## 2019-04-27 DIAGNOSIS — M170 Bilateral primary osteoarthritis of knee: Secondary | ICD-10-CM

## 2019-04-27 DIAGNOSIS — M19041 Primary osteoarthritis, right hand: Secondary | ICD-10-CM

## 2019-04-27 DIAGNOSIS — Z9049 Acquired absence of other specified parts of digestive tract: Secondary | ICD-10-CM

## 2019-04-27 MED ORDER — TRIAMCINOLONE ACETONIDE 40 MG/ML IJ SUSP
40.0000 mg | INTRAMUSCULAR | Status: AC | PRN
  Administered 2019-04-27: 40 mg via INTRA_ARTICULAR

## 2019-04-27 MED ORDER — LIDOCAINE HCL 1 % IJ SOLN
1.5000 mL | INTRAMUSCULAR | Status: AC | PRN
  Administered 2019-04-27: 1.5 mL

## 2019-04-27 NOTE — Patient Instructions (Addendum)
Standing Labs We placed an order today for your standing lab work.    Please come back and get your standing labs in 2 weeks and every 3 months   We have open lab daily Monday through Thursday from 8:30-12:30 PM and 1:30-4:30 PM and Friday from 8:30-12:30 PM and 1:30 -4:00 PM at the office of Dr. Bo Merino.   You may experience shorter wait times on Monday and Friday afternoons. The office is located at [address] [place], [place], [place] [address] No appointment is necessary.   Labs are drawn by [facility].  You may receive a bill from [facility] for your lab work.  If you wish to have your labs drawn at another location, please call the office 24 hours in advance to send orders.  If you have any questions regarding directions or hours of operation,  please call [phone].   Just as a reminder please drink plenty of water prior to coming for your lab work. Thanks!

## 2019-04-27 NOTE — Telephone Encounter (Signed)
Spoke with patient and scheduled an appointment at 1:00 pm.

## 2019-04-27 NOTE — Telephone Encounter (Signed)
Patient called stating her left knee has become swollen and painful.  Patient states she is having difficulty walking and requesting a return call regarding a cortisone injection.

## 2019-04-27 NOTE — Progress Notes (Signed)
Office Visit Note  Patient: Katherine Wang             Date of Birth: 02/02/1946           MRN: [id number]             PCP: [name], MD Referring: [name], MD Visit Date: 04/27/2019 Occupation: @GUAROCC @  Subjective:  Left knee joint pain   History of Present Illness: Katherine Wang is a 73 y.o. female with history of seropositive rheumatoid arthritis and osteoarthritis.  She is prescribed MTX 0.4 ml sq once weekly, folic acid 1 mg po daily, and Arava 10 mg alternating with 20 mg every other day.  She has a history of neutropenia and melanoma, so she has limited treatment options.   She was diagnosed with pneumonia on 04/04/19 and was on doxycyline and has been holding MTX and Arava.  She was on a prednisone taper starting at 10 mg tapering by 2.5 mg every week and discontinued about 3 days ago.  She was planning on restarting MTX and Arava today.  She presents today with left knee joint pain and inflammation.  She states the pain started 3 days ago and has been severe.  She states she was having difficulty walking.  She continues to have pain in the left elbow, left shoulder, and hands.     Activities of Daily Living:  Patient reports morning stiffness for 5 minutes.   Patient Denies nocturnal pain.  Difficulty dressing/grooming: Denies Difficulty climbing stairs: Denies Difficulty getting out of chair: Reports Difficulty using hands for taps, buttons, cutlery, and/or writing: Reports  Review of Systems  Constitutional: Negative for fatigue.  HENT: Negative for mouth sores, mouth dryness and nose dryness.   Eyes: Positive for dryness. Negative for pain and visual disturbance.  Respiratory: Negative for cough, hemoptysis, shortness of breath and difficulty breathing.   Cardiovascular: Negative for chest pain, palpitations, hypertension and swelling in legs/feet.  Gastrointestinal: Positive for constipation. Negative for blood in stool and diarrhea.  Endocrine:  Negative for increased urination.  Genitourinary: Negative for painful urination.  Musculoskeletal: Positive for arthralgias, joint pain, joint swelling and morning stiffness. Negative for myalgias, muscle weakness, muscle tenderness and myalgias.  Skin: Negative for color change, pallor, rash, hair loss, nodules/bumps, skin tightness, ulcers and sensitivity to sunlight.  Allergic/Immunologic: Negative for susceptible to infections.  Neurological: Negative for dizziness, numbness, headaches and weakness.  Hematological: Negative for swollen glands.  Psychiatric/Behavioral: Negative for depressed mood and sleep disturbance. The patient is not nervous/anxious.     PMFS History:  Patient Active Problem List   Diagnosis Date Noted   Chest pain 04/04/2019   Anxiety 04/04/2019   Leukopenia 04/04/2019   Pneumonia 04/04/2019   Dyspnea on exertion    Moderate aortic regurgitation 11/02/2017   Lightheadedness 11/02/2017   Tachycardia 07/26/2017   Atypical chest pain 07/26/2017   High risk medications (not anticoagulants) long-term use 01/16/2017   Baker's cyst of knee, left 12/19/2016   Pain in both knees 12/19/2016   Pain of both elbows 12/19/2016   Malignant melanoma ([place]) 12/19/2016   Basal cell carcinoma 12/19/2016   Chronic pansinusitis 08/15/2016   Deviated septum 08/15/2016   Epistaxis 08/15/2016   Hypertrophy, nasal, turbinate 08/15/2016   Upper airway cough syndrome 04/25/2016   Hx of adenomatous colonic polyps 06/21/2015   Hyperlipidemia 11/15/2014   Rheumatoid arthritis ([place]) 09/01/2014   Pericarditis 04/24/2011   Aortic valve disorder 02/14/2009   ELECTROCARDIOGRAM, ABNORMAL 02/14/2009  PERNICIOUS ANEMIA 07/10/2007   DEPRESSION 07/03/2007   MALAISE AND FATIGUE 07/03/2007   Hypothyroidism 04/30/2007   Essential hypertension 04/30/2007   Aneurysm of thoracic aorta ([place]) 02/13/2006    Past Medical History:  Diagnosis Date   Adenomatous  colon polyp 2012   Allergy    Anxiety    Aortic insufficiency    Arthritis    Basal cell carcinoma    Cataract    Depression    Diverticulosis    Fibromyalgia    Fibromyalgia    Hyperlipidemia    Hypertension    Macular degeneration    Melanoma ([place])    Murmur, heart    Reflux    Stroke ([place]) 20 years ago   per pt. mild stroke   Thyroid disease    hypothroidism     Family History  Problem Relation Age of Onset   Heart disease Mother    Hypertension Mother    Mental illness Mother    Allergies Mother    Heart disease Father    Hypertension Father    Mental illness Father    Emphysema Father        smoked   Allergies Father    Heart attack Father    Hyperlipidemia Sister    Hypertension Sister    Allergies Sister    Hypertension Sister    Diabetes Sister    Allergies Sister    Epilepsy Son    Aneurysm Son    Hyperthyroidism Daughter    Hypothyroidism Daughter    Hypertension Daughter    Colon cancer Neg Hx    Esophageal cancer Neg Hx    Rectal cancer Neg Hx    Stomach cancer Neg Hx    Pancreatic cancer Neg Hx    Prostate cancer Neg Hx    Past Surgical History:  Procedure Laterality Date   ABDOMINAL HYSTERECTOMY     APPENDECTOMY     BASAL CELL CARCINOMA EXCISION     nose    CARDIAC CATHETERIZATION     CHOLECYSTECTOMY     COLONOSCOPY     30 + years ago, unsure of where   EYE SURGERY     LASIK Bilateral    MELANOMA EXCISION     NASAL SEPTUM SURGERY     TUBAL LIGATION     Social History   Social History Narrative   Lives alone in a one story home.  Has 2 living children.  Retired Engineer, water.     Immunization History  Administered Date(s) Administered   Influenza, High Dose Seasonal PF 08/16/2016, 12/12/2017, 09/22/2018   Pneumococcal Conjugate-13 09/01/2014   Pneumococcal Polysaccharide-23 12/12/2017   Td 11/29/2006     Objective: Vital Signs: BP 129/83 (BP Location: Left Arm,  Patient Position: Sitting, Cuff Size: Small)    Pulse 82    Resp 12    Ht 4' 11.75" (1.518 m)    Wt 153 lb 3.2 oz (69.5 kg)    BMI 30.17 kg/m    Physical Exam Vitals signs and nursing note reviewed.  Constitutional:      Appearance: She is well-developed.  HENT:     Head: Normocephalic and atraumatic.  Eyes:     Conjunctiva/sclera: Conjunctivae normal.  Neck:     Musculoskeletal: Normal range of motion.  Cardiovascular:     Rate and Rhythm: Normal rate and regular rhythm.     Heart sounds: Normal heart sounds.  Pulmonary:     Effort: Pulmonary effort is normal.     Breath  sounds: Normal breath sounds.  Abdominal:     General: Bowel sounds are normal.     Palpations: Abdomen is soft.  Lymphadenopathy:     Cervical: No cervical adenopathy.  Skin:    General: Skin is warm and dry.     Capillary Refill: Capillary refill takes less than 2 seconds.  Neurological:     Mental Status: She is alert and oriented to person, place, and time.  Psychiatric:        Behavior: Behavior normal.      Musculoskeletal Exam: C-spine, thoracic spine, and lumbar spine good ROM.  No midline spinal tenderness.  No SI joint tenderness.  Shoulder joints limited abduction and forward flexion of the left shoulder joint. Tenderness of both shoulder joints.  Left elbow joint contracture, tenderness, and warmth.  Right elbow has good ROM with no tenderness or inflammation.  Wrist joints, MCPs, PIPs, and DIPs good ROM.  Synovial thickening of bilateral CMC joints.  Tenderness of PIP joints and MCP joints as described above.  Hip joints good ROM . Limited extension of both knee joints.  Left knee warmth and swelling noted.  Ankle joints have good ROM with no warmth, tenderness, or inflammation.    CDAI Exam: CDAI Score: 17  Patient Global: 5 mm; Provider Global: 5 mm Swollen: 3 ; Tender: 13  Joint Exam      Right  Left  Glenohumeral   Tender   Tender  Elbow     Swollen Tender  MCP 1   Tender   Tender  MCP  2      Tender  PIP 2   Tender   Tender  PIP 3  Swollen Tender   Tender  PIP 4   Tender   Tender  Knee     Swollen Tender     Investigation: No additional findings.  Imaging: Dg Chest 2 View  Result Date: 04/04/2019 CLINICAL DATA:  73 year old female with shortness of breath. EXAM: CHEST - 2 VIEW COMPARISON:  Chest CT dated 12/05/2017 FINDINGS: The lungs are clear. There is no pleural effusion or pneumothorax. The cardiac silhouette is within normal limits. Coronary vascular calcification noted. The aorta is tortuous. Atherosclerotic calcification of the aortic arch. No acute osseous pathology. Osteopenia with thoracolumbar scoliosis. There is atherosclerotic calcification of the abdominal aorta. Right upper quadrant cholecystectomy clips. IMPRESSION: No active cardiopulmonary disease. Electronically Signed   By: Anner Crete M.D.   On: 04/04/2019 02:21   Ct Angio Chest Pe W And/or Wo Contrast  Result Date: 04/04/2019 CLINICAL DATA:  Shortness of breath. Evaluate for pulmonary embolus. EXAM: CT ANGIOGRAPHY CHEST WITH CONTRAST TECHNIQUE: Multidetector CT imaging of the chest was performed using the standard protocol during bolus administration of intravenous contrast. Multiplanar CT image reconstructions and MIPs were obtained to evaluate the vascular anatomy. CONTRAST:  169mL OMNIPAQUE IOHEXOL 350 MG/ML SOLN COMPARISON:  CT chest 12/05/2017 FINDINGS: Cardiovascular: Heart is mildly enlarged. No pericardial effusion. Ascending thoracic aorta measures 4.1 cm, similar to prior. Aberrant origin of the right subclavian artery. Adequate opacification of the pulmonary arteries. Mild motion artifact limits evaluation. No evidence for intraluminal filling defect to suggest acute pulmonary embolus. Mediastinum/Nodes: No enlarged axillary, mediastinal or hilar lymphadenopathy. Moderate-sized hiatal hernia. Lungs/Pleura: Central airways are patent. Dependent atelectasis within the bilateral lower lobes.  Interval development of a 1.3 cm nodular area of consolidation within the lingula (image 71; series 7). 1.1 x 0.6 cm ground-glass nodule within the right upper (image 51; series 7). Ground-glass opacities  within the right middle lobe centrally (image 85; series 7). No pleural effusion or pneumothorax. Upper Abdomen: Unremarkable Musculoskeletal: No aggressive or acute appearing osseous lesions. Thoracic spine degenerative changes. Review of the MIP images confirms the above findings. IMPRESSION: 1. No evidence for acute pulmonary embolus. 2. Interval development of 1.1 cm nodular area of consolidation within the lingula which may be infectious/inflammatory in etiology. True underlying nodule not excluded. Recommend follow-up chest CT in 2-3 months to assess for interval resolution. 3. New ground-glass nodule within the right upper lobe which may be infectious/inflammatory in etiology. Recommend attention on follow-up chest CT. 4. Ascending thoracic aorta measures 4.1 cm. Recommend annual imaging followup by CTA or MRA. This recommendation follows 2010 ACCF/AHA/AATS/ACR/ASA/SCA/SCAI/SIR/STS/SVM Guidelines for the Diagnosis and Management of Patients with Thoracic Aortic Disease. Circulation. 2010; 121: U932-T557. Aortic aneurysm NOS (ICD10-I71.9) 5. Aortic Atherosclerosis (ICD10-I70.0). Electronically Signed   By: Lovey Newcomer M.D.   On: 04/04/2019 05:13    Recent Labs: Lab Results  Component Value Date   WBC 3.7 (L) 04/13/2019   HGB 12.4 04/13/2019   PLT 201.0 04/13/2019   NA 136 04/04/2019   K 3.5 04/04/2019   CL 103 04/04/2019   CO2 21 (L) 04/04/2019   GLUCOSE 92 04/04/2019   BUN 17 04/04/2019   CREATININE 0.80 04/04/2019   BILITOT 0.7 02/25/2019   ALKPHOS 80 10/14/2018   AST 19 02/25/2019   ALT 15 02/25/2019   PROT 6.6 02/25/2019   ALBUMIN 4.0 10/14/2018   CALCIUM 9.2 04/04/2019   GFRAA >60 04/04/2019    Speciality Comments: No specialty comments available.  Procedures:  Large Joint  Inj: L knee on 04/27/2019 1:30 PM Indications: pain Details: 27 G 1.5 in needle, medial approach  Arthrogram: No  Medications: 1.5 mL lidocaine 1 %; 40 mg triamcinolone acetonide 40 MG/ML Aspirate: 0 mL Outcome: tolerated well, no immediate complications Procedure, treatment alternatives, risks and benefits explained, specific risks discussed. Consent was given by the patient. Immediately prior to procedure a time out was called to verify the correct patient, procedure, equipment, support staff and site/side marked as required. Patient was prepped and draped in the usual sterile fashion.     Allergies: Tramadol and Statins   Assessment / Plan:     Visit Diagnoses: Rheumatoid arthritis involving multiple sites with positive rheumatoid factor ([place]) - She has active inflammation and synovitis as described above.  She presents today with left knee joint pain and swelling.  She has been having severe pain and difficulty bearing weight on the left knee for the past 3 days.  She is currently walking with cane. She has limited extension of both knee joints. A left knee joint cortisone injection was performed today. X-rays of both knee joints were obtained today.   She has tenderness, warmth, and flexion contracture of the left elbow joint.  She has tenderness in MCP and PIP joints as described above.  She was diagnosed with pneumonia on 04/04/19 and was treated with a course of doxycyline and was holding MTX and Arava at that time.  On 03/25/19 she was prescribed prednisone 10 mg and she was advised to tapered by 2.5 mg every week.  She discontinued prednisone 3 days ago, and she has been having increased joint pain and swelling since then. Today she will be restarting on MTX 0.28ml sq injections once weekly, folic acid 1 mg po daily, and Arava 10 mg alternating with 20 mg every other day. She will return in 2 weeks for lab  work.  She does not need any refills at this time.  She was advised to notify us if she  has persistent or worsening joint pain and joint swelling.  She will follow up in 3 months.   High risk medications (not anticoagulants) long-term use - MTX 0.4 ml sq qwk, Arava 10 mg alternating with 20 mg po every other day, folic acid 1 mg po qd.  She will return for lab work in 2 weeks (after restarting on MTX and [place]).  She has a history of neutropenia, so we will continue to monitor CBC closely.  She was advised to hold MTX and Arava if she develops signs or symptoms of an infection. The importance of social distancing and following the standard precautions recommended by the CDC were discussed.   Primary osteoarthritis of both hands -She has PIP and DIP synovial thickening.  She has bilateral CMC joint synovial thickening.  She has been having increased pain in both hands.  Joint protection and muscle strengthening were discussed.   Primary osteoarthritis of both knees -She has chronic pain in both knee joints.  She presents today with severe left knee joint pain that started 3 days ago. She has warmth and swelling of the left knee on exam.  Limited extension of both knee joints noted.  X-rays of both knee joints were updated today.  She requested a left knee joint cortisone injection.  She tolerated the procedure well.  Procedure note completed above.  Plan: XR KNEE 3 VIEW LEFT, XR KNEE 3 VIEW RIGHT.  X-ray was consistent with moderate osteoarthritis and mild chondromalacia patella.  Chronic pain of both knees -She has chronic bilateral knee join pain.  She has limited extension with discomfort bilaterally.  Left knee has warmth and swelling.  She requested a left knee joint cortisone injection today.  Plan: XR KNEE 3 VIEW LEFT, XR KNEE 3 VIEW RIGHT.  The x-ray showed mild chondromalacia patella and moderate osteoarthritis.  Primary osteoarthritis of both feet - She has PIP and DIP synovial thickening consistent with osteoarthritis.  Bunions noted bilaterally.  No tenderness at this time.    Other medical conditions are listed as follows:   History of asthma   History of hypertension   History of hypothyroidism   History of macular degeneration   History of anxiety   History of depression  History of basal cell carcinoma   History of anemia  History of cholecystectomy     Orders: Orders Placed This Encounter  Procedures   Large Joint Inj   XR KNEE 3 VIEW LEFT   XR KNEE 3 VIEW RIGHT   No orders of the defined types were placed in this encounter.   Face-to-face time spent with patient was 30 minutes. Greater than 50% of time was spent in counseling and coordination of care.  Follow-Up Instructions: Return in about 5 months (around 09/27/2019) for Rheumatoid arthritis, Osteoarthritis.   Ofilia Neas, PA-C  Note - This record has been created using Dragon software.  Chart creation errors have been sought, but may not always  have been located. Such creation errors do not reflect on  the standard of medical care.

## 2019-05-06 ENCOUNTER — Other Ambulatory Visit: Payer: Self-pay | Admitting: Family Medicine

## 2019-05-06 DIAGNOSIS — R05 Cough: Secondary | ICD-10-CM

## 2019-05-06 DIAGNOSIS — R058 Other specified cough: Secondary | ICD-10-CM

## 2019-05-13 ENCOUNTER — Ambulatory Visit (INDEPENDENT_AMBULATORY_CARE_PROVIDER_SITE_OTHER): Payer: Medicare Other | Admitting: Physician Assistant

## 2019-05-13 ENCOUNTER — Encounter: Payer: Self-pay | Admitting: Physician Assistant

## 2019-05-13 ENCOUNTER — Other Ambulatory Visit: Payer: Self-pay

## 2019-05-13 VITALS — BP 130/82 | HR 84

## 2019-05-13 DIAGNOSIS — M25522 Pain in left elbow: Secondary | ICD-10-CM | POA: Diagnosis not present

## 2019-05-13 DIAGNOSIS — M24522 Contracture, left elbow: Secondary | ICD-10-CM

## 2019-05-13 DIAGNOSIS — M1711 Unilateral primary osteoarthritis, right knee: Secondary | ICD-10-CM

## 2019-05-13 DIAGNOSIS — M0579 Rheumatoid arthritis with rheumatoid factor of multiple sites without organ or systems involvement: Secondary | ICD-10-CM

## 2019-05-13 MED ORDER — FOLIC ACID 1 MG PO TABS: ORAL_TABLET | ORAL | 3 refills | Status: DC

## 2019-05-13 MED ORDER — TRIAMCINOLONE ACETONIDE 40 MG/ML IJ SUSP
30.0000 mg | INTRAMUSCULAR | Status: AC | PRN
  Administered 2019-05-13: 30 mg via INTRA_ARTICULAR

## 2019-05-13 MED ORDER — LIDOCAINE HCL 1 % IJ SOLN
1.0000 mL | INTRAMUSCULAR | Status: AC | PRN
  Administered 2019-05-13: 1 mL

## 2019-05-13 MED ORDER — METHOTREXATE SODIUM CHEMO INJECTION 50 MG/2ML: INTRAMUSCULAR | 0 refills | Status: DC

## 2019-05-13 NOTE — Progress Notes (Signed)
   Procedure Note  Patient: MIXTLI RENO             Date of Birth: 1946/04/09           MRN: [id number]             Visit Date: 05/13/2019  Procedures: Visit Diagnoses:  1. Primary osteoarthritis of right knee   2. Contracture of left elbow   3. Pain in joint of left elbow     Indications: pain Details: 27 G 1.5 in needle, medial approach  Arthrogram: No  Medications: 1.5 mL lidocaine 1 %; 40 mg triamcinolone acetonide 40 MG/ML Aspirate: 0 mL Outcome: tolerated well, no immediate complications Procedure, treatment alternatives, risks and benefits explained, specific risks discussed. Consent was given by the patient. Immediately prior to procedure a time out was called to verify the correct patient, procedure, equipment, support staff and site/side marked as required. Patient was prepped and draped in the usual sterile fashion.   Medium Joint Inj: L elbow on 05/13/2019 2:01 PM Indications: pain and joint swelling Details: 27 G 1.5 in needle, posterior approach Medications: 1 mL lidocaine 1 %; 30 mg triamcinolone acetonide 40 MG/ML Aspirate: 0 mL Outcome: tolerated well, no immediate complications Procedure, treatment alternatives, risks and benefits explained, specific risks discussed. Consent was given by the patient. Immediately prior to procedure a time out was called to verify the correct patient, procedure, equipment, support staff and site/side marked as required. Patient was prepped and draped in the usual sterile fashion.      Patient tolerated the procedures well.  Hazel Sams, PA-C

## 2019-05-22 DIAGNOSIS — D692 Other nonthrombocytopenic purpura: Secondary | ICD-10-CM | POA: Diagnosis not present

## 2019-05-22 DIAGNOSIS — L720 Epidermal cyst: Secondary | ICD-10-CM | POA: Diagnosis not present

## 2019-05-22 DIAGNOSIS — D2262 Melanocytic nevi of left upper limb, including shoulder: Secondary | ICD-10-CM | POA: Diagnosis not present

## 2019-05-22 DIAGNOSIS — D224 Melanocytic nevi of scalp and neck: Secondary | ICD-10-CM | POA: Diagnosis not present

## 2019-05-22 DIAGNOSIS — D225 Melanocytic nevi of trunk: Secondary | ICD-10-CM | POA: Diagnosis not present

## 2019-05-22 DIAGNOSIS — D1801 Hemangioma of skin and subcutaneous tissue: Secondary | ICD-10-CM | POA: Diagnosis not present

## 2019-05-22 DIAGNOSIS — Z85828 Personal history of other malignant neoplasm of skin: Secondary | ICD-10-CM | POA: Diagnosis not present

## 2019-05-22 DIAGNOSIS — L821 Other seborrheic keratosis: Secondary | ICD-10-CM | POA: Diagnosis not present

## 2019-05-22 DIAGNOSIS — Z85820 Personal history of malignant melanoma of skin: Secondary | ICD-10-CM | POA: Diagnosis not present

## 2019-05-22 DIAGNOSIS — L570 Actinic keratosis: Secondary | ICD-10-CM | POA: Diagnosis not present

## 2019-05-25 ENCOUNTER — Other Ambulatory Visit: Payer: Self-pay | Admitting: Family Medicine

## 2019-05-25 ENCOUNTER — Ambulatory Visit: Payer: Medicare Other | Admitting: Physician Assistant

## 2019-05-25 DIAGNOSIS — F329 Major depressive disorder, single episode, unspecified: Secondary | ICD-10-CM

## 2019-05-25 DIAGNOSIS — F32A Depression, unspecified: Secondary | ICD-10-CM

## 2019-06-01 ENCOUNTER — Other Ambulatory Visit (HOSPITAL_COMMUNITY): Payer: Self-pay

## 2019-06-01 DIAGNOSIS — H353132 Nonexudative age-related macular degeneration, bilateral, intermediate dry stage: Secondary | ICD-10-CM | POA: Diagnosis not present

## 2019-06-01 MED ORDER — IRBESARTAN 300 MG PO TABS: 300.0000 mg | ORAL_TABLET | Freq: Every day | ORAL | 11 refills | Status: AC

## 2019-06-17 ENCOUNTER — Ambulatory Visit (HOSPITAL_BASED_OUTPATIENT_CLINIC_OR_DEPARTMENT_OTHER)
Admission: RE | Admit: 2019-06-17 | Discharge: 2019-06-17 | Disposition: A | Discharge status: Home / Self Care | Payer: Medicare Other | Source: Ambulatory Visit | Attending: Family Medicine | Admitting: Family Medicine

## 2019-06-17 ENCOUNTER — Other Ambulatory Visit: Payer: Self-pay

## 2019-06-17 DIAGNOSIS — R911 Solitary pulmonary nodule: Secondary | ICD-10-CM | POA: Diagnosis not present

## 2019-06-17 DIAGNOSIS — J189 Pneumonia, unspecified organism: Secondary | ICD-10-CM | POA: Diagnosis not present

## 2019-06-17 DIAGNOSIS — I700 Atherosclerosis of aorta: Secondary | ICD-10-CM | POA: Diagnosis not present

## 2019-06-18 ENCOUNTER — Encounter: Payer: Self-pay | Admitting: Family Medicine

## 2019-06-22 DIAGNOSIS — H40003 Preglaucoma, unspecified, bilateral: Secondary | ICD-10-CM | POA: Diagnosis not present

## 2019-06-25 ENCOUNTER — Ambulatory Visit: Payer: Medicare Other | Admitting: Rheumatology

## 2019-07-01 ENCOUNTER — Other Ambulatory Visit: Payer: Self-pay | Admitting: Physician Assistant

## 2019-07-01 DIAGNOSIS — M0579 Rheumatoid arthritis with rheumatoid factor of multiple sites without organ or systems involvement: Secondary | ICD-10-CM

## 2019-07-01 NOTE — Telephone Encounter (Signed)
ok 

## 2019-07-01 NOTE — Telephone Encounter (Signed)
Last Visit: 05/13/19 Next Visit: 07/28/19 Labs:  CBC- 04/13/19 WBC 3.7 RBC 3.85 RDW 17.5  04/04/19 BMP CO2 21  Okay to refill MTX?

## 2019-07-07 ENCOUNTER — Other Ambulatory Visit: Payer: Self-pay | Admitting: Family Medicine

## 2019-07-07 DIAGNOSIS — F419 Anxiety disorder, unspecified: Secondary | ICD-10-CM

## 2019-07-08 ENCOUNTER — Other Ambulatory Visit: Payer: Self-pay

## 2019-07-08 DIAGNOSIS — M0579 Rheumatoid arthritis with rheumatoid factor of multiple sites without organ or systems involvement: Secondary | ICD-10-CM | POA: Diagnosis not present

## 2019-07-09 LAB — COMPLETE METABOLIC PANEL WITH GFR
AG Ratio: 1.7 (calc) (ref 1.0–2.5)
ALT: 18 U/L (ref 6–29)
AST: 22 U/L (ref 10–35)
Albumin: 3.8 g/dL (ref 3.6–5.1)
Alkaline phosphatase (APISO): 64 U/L (ref 37–153)
BUN: 18 mg/dL (ref 7–25)
CO2: 27 mmol/L (ref 20–32)
Calcium: 9.3 mg/dL (ref 8.6–10.4)
Chloride: 100 mmol/L (ref 98–110)
Creat: 0.86 mg/dL (ref 0.60–0.93)
GFR, Est African American: 77 mL/min/{1.73_m2} (ref >=60)
GFR, Est Non African American: 66 mL/min/{1.73_m2} (ref >=60)
Globulin: 2.2 g/dL (calc) (ref 1.9–3.7)
Glucose, Bld: 86 mg/dL (ref 65–99)
Potassium: 3.9 mmol/L (ref 3.5–5.3)
Sodium: 135 mmol/L (ref 135–146)
Total Bilirubin: 0.5 mg/dL (ref 0.2–1.2)
Total Protein: 6 g/dL — ABNORMAL LOW (ref 6.1–8.1)

## 2019-07-09 LAB — CBC WITH DIFFERENTIAL/PLATELET
Absolute Monocytes: 275 cells/uL (ref 200–950)
Basophils Absolute: 19 cells/uL (ref 0–200)
Basophils Relative: 0.6 %
Eosinophils Absolute: 42 cells/uL (ref 15–500)
Eosinophils Relative: 1.3 %
HCT: 35.9 % (ref 35.0–45.0)
Hemoglobin: 12.2 g/dL (ref 11.7–15.5)
Lymphs Abs: 1238 cells/uL (ref 850–3900)
MCH: 33.4 pg — ABNORMAL HIGH (ref 27.0–33.0)
MCHC: 34 g/dL (ref 32.0–36.0)
MCV: 98.4 fL (ref 80.0–100.0)
MPV: 9.7 fL (ref 7.5–12.5)
Monocytes Relative: 8.6 %
Neutro Abs: 1626 cells/uL (ref 1500–7800)
Neutrophils Relative %: 50.8 %
Platelets: 202 10*3/uL (ref 140–400)
RBC: 3.65 10*6/uL — ABNORMAL LOW (ref 3.80–5.10)
RDW: 15.6 % — ABNORMAL HIGH (ref 11.0–15.0)
Total Lymphocyte: 38.7 %
WBC: 3.2 10*3/uL — ABNORMAL LOW (ref 3.8–10.8)

## 2019-07-09 NOTE — Progress Notes (Signed)
Stable. Will continue to monitor.

## 2019-07-13 NOTE — Progress Notes (Signed)
Office Visit Note  Patient: Katherine Wang             Date of Birth: 04-19-1946           MRN: [id number]             PCP: [name], MD Referring: [name], MD Visit Date: 07/14/2019 Occupation: @GUAROCC @  Subjective:  Pain of the Right Shoulder and Pain of the Right Knee    History of Present Illness: Katherine Wang is a 73 y.o. female with history of seropositive rheumatoid arthritis.  She has been on methotrexate 0.  4 mL subcu weekly and Arava 10 mg p.o. alternating with 20 mg p.o. daily.  She states about 2 weeks ago she started having pain and discomfort in her right shoulder joint.  She has been having discomfort in her right arm as well.  She has pain and discomfort in her right knee.  She also describes discomfort in her right [place] joint and bilateral trochanteric area.  None of the other joints are painful.  Activities of Daily Living:  Patient reports morning stiffness for 10 minutes.   Patient Reports nocturnal pain.  Difficulty dressing/grooming: Reports Difficulty climbing stairs: Reports Difficulty getting out of chair: Reports Difficulty using hands for taps, buttons, cutlery, and/or writing: Reports  Review of Systems  Constitutional: Negative for fatigue, night sweats, weight gain and weight loss.  HENT: Negative for mouth sores, trouble swallowing, trouble swallowing, mouth dryness and nose dryness.   Eyes: Positive for dryness. Negative for pain, redness and visual disturbance.  Respiratory: Negative for cough, shortness of breath and difficulty breathing.   Cardiovascular: Negative for chest pain, palpitations, hypertension, irregular heartbeat and swelling in legs/feet.  Gastrointestinal: Positive for constipation. Negative for blood in stool and diarrhea.  Endocrine: Positive for increased urination.  Genitourinary: Negative for difficulty urinating and vaginal dryness.  Musculoskeletal: Positive for arthralgias, gait problem, joint pain,  joint swelling and morning stiffness. Negative for myalgias, muscle weakness, muscle tenderness and myalgias.  Skin: Negative for color change, rash, hair loss, skin tightness, ulcers and sensitivity to sunlight.  Allergic/Immunologic: Negative for susceptible to infections.  Neurological: Negative for dizziness, memory loss, night sweats and weakness.  Hematological: Negative for bruising/bleeding tendency and swollen glands.  Psychiatric/Behavioral: Negative for depressed mood and sleep disturbance. The patient is not nervous/anxious.     PMFS History:  Patient Active Problem List   Diagnosis Date Noted  . Chest pain 04/04/2019  . Anxiety 04/04/2019  . Leukopenia 04/04/2019  . Pneumonia 04/04/2019  . Dyspnea on exertion   . Moderate aortic regurgitation 11/02/2017  . Lightheadedness 11/02/2017  . Tachycardia 07/26/2017  . Atypical chest pain 07/26/2017  . High risk medications (not anticoagulants) long-term use 01/16/2017  . Baker's cyst of knee, left 12/19/2016  . Pain in both knees 12/19/2016  . Pain of both elbows 12/19/2016  . Malignant melanoma ([place]) 12/19/2016  . Basal cell carcinoma 12/19/2016  . Chronic pansinusitis 08/15/2016  . Deviated septum 08/15/2016  . Epistaxis 08/15/2016  . Hypertrophy, nasal, turbinate 08/15/2016  . Upper airway cough syndrome 04/25/2016  . Hx of adenomatous colonic polyps 06/21/2015  . Hyperlipidemia 11/15/2014  . Rheumatoid arthritis ([place]) 09/01/2014  . Pericarditis 04/24/2011  . Aortic valve disorder 02/14/2009  . ELECTROCARDIOGRAM, ABNORMAL 02/14/2009  . PERNICIOUS ANEMIA 07/10/2007  . DEPRESSION 07/03/2007  . MALAISE AND FATIGUE 07/03/2007  . Hypothyroidism 04/30/2007  . Essential hypertension 04/30/2007  . Aneurysm of thoracic aorta ([place]) 02/13/2006  Past Medical History:  Diagnosis Date  . Adenomatous colon polyp 2012  . Allergy   . Anxiety   . Aortic insufficiency   . Arthritis   . Basal cell carcinoma   . Cataract    . Depression   . Diverticulosis   . Fibromyalgia   . Fibromyalgia   . Hyperlipidemia   . Hypertension   . Macular degeneration   . Melanoma ([place])   . Murmur, heart   . Reflux   . Stroke ([place]) 20 years ago   per pt. mild stroke  . Thyroid disease    hypothroidism     Family History  Problem Relation Age of Onset  . Heart disease Mother   . Hypertension Mother   . Mental illness Mother   . Allergies Mother   . Heart disease Father   . Hypertension Father   . Mental illness Father   . Emphysema Father        smoked  . Allergies Father   . Heart attack Father   . Hyperlipidemia Sister   . Hypertension Sister   . Allergies Sister   . Hypertension Sister   . Diabetes Sister   . Allergies Sister   . Epilepsy Son   . Aneurysm Son   . Hyperthyroidism Daughter   . Hypothyroidism Daughter   . Hypertension Daughter   . Colon cancer Neg Hx   . Esophageal cancer Neg Hx   . Rectal cancer Neg Hx   . Stomach cancer Neg Hx   . Pancreatic cancer Neg Hx   . Prostate cancer Neg Hx    Past Surgical History:  Procedure Laterality Date  . ABDOMINAL HYSTERECTOMY    . APPENDECTOMY    . BASAL CELL CARCINOMA EXCISION     nose   . CARDIAC CATHETERIZATION    . CHOLECYSTECTOMY    . COLONOSCOPY     30 + years ago, unsure of where  . EYE SURGERY    . LASIK Bilateral   . MELANOMA EXCISION    . NASAL SEPTUM SURGERY    . TUBAL LIGATION     Social History   Social History Narrative   Lives alone in a one story home.  Has 2 living children.  Retired Engineer, water.     Immunization History  Administered Date(s) Administered  . Influenza, High Dose Seasonal PF 08/16/2016, 12/12/2017, 09/22/2018  . Pneumococcal Conjugate-13 09/01/2014  . Pneumococcal Polysaccharide-23 12/12/2017  . Td 11/29/2006     Objective: Vital Signs: BP 138/83 (BP Location: Left Arm, Patient Position: Sitting, Cuff Size: Normal)   Pulse 83   Resp 18   Ht 4' 11.75" (1.518 m)   Wt 153 lb 3.2 oz (69.5 kg)    BMI 30.17 kg/m    Physical Exam Vitals signs and nursing note reviewed.  Constitutional:      Appearance: She is well-developed.  HENT:     Head: Normocephalic and atraumatic.  Eyes:     Conjunctiva/sclera: Conjunctivae normal.  Neck:     Musculoskeletal: Normal range of motion.  Cardiovascular:     Rate and Rhythm: Normal rate and regular rhythm.     Heart sounds: Normal heart sounds.  Pulmonary:     Effort: Pulmonary effort is normal.     Breath sounds: Normal breath sounds.  Abdominal:     General: Bowel sounds are normal.     Palpations: Abdomen is soft.  Lymphadenopathy:     Cervical: No cervical adenopathy.  Skin:  General: Skin is warm and dry.     Capillary Refill: Capillary refill takes less than 2 seconds.  Neurological:     Mental Status: She is alert and oriented to person, place, and time.  Psychiatric:        Behavior: Behavior normal.      Musculoskeletal Exam: C-spine was in good range of motion.  She had painful range of motion of her right shoulder with tenderness over subacromial bursa.  Elbow joints and wrist joints with good range of motion.  She has DIP and PIP thickening but no synovitis was noted.  She has some popping sensation in her right hip.  Right knee joint was swollen and warm.  All the joints in good range of motion.  CDAI Exam: CDAI Score: 2.4  Patient Global: 7 mm; Provider Global: 7 mm Swollen: 0 ; Tender: 2  Joint Exam      Right  Left  Glenohumeral   Tender     CMC   Tender        Investigation: No additional findings.  Imaging: Ct Chest Wo Contrast  Result Date: 06/17/2019 CLINICAL DATA:  Recent pneumonia, follow-up lung nodule EXAM: CT CHEST WITHOUT CONTRAST TECHNIQUE: Multidetector CT imaging of the chest was performed following the standard protocol without IV contrast. COMPARISON:  04/04/2019, 12/05/2017 FINDINGS: Cardiovascular: Aortic atherosclerosis. Unchanged enlargement of the tubular ascending thoracic aorta,  measuring 4.1 x 4.1 cm. Normal heart size. Left coronary artery calcifications. No pericardial effusion. Mediastinum/Nodes: No enlarged mediastinal, hilar, or axillary lymph nodes. Thyroid gland, trachea, and esophagus demonstrate no significant findings. Lungs/Pleura: Near complete interval resolution of a previously seen nodular opacity of the lingula (series 3, image 75). There is scattered, persistent ground-glass in the lingula, right middle lobe, and lung bases, improved compared to immediate prior examination and similar to appearance on examination dated 12/05/2017. No pleural effusion or pneumothorax. Upper Abdomen: No acute abnormality. Stable, benign left adrenal adenoma. Musculoskeletal: No chest wall mass or suspicious bone lesions identified. IMPRESSION: 1. Near complete interval resolution of a previously seen nodular opacity of the lingula (series 3, image 75). There is scattered, persistent ground-glass in the lingula, right middle lobe, and lung bases, improved compared to immediate prior examination and similar to appearance on examination dated 12/05/2017. Underlying findings are most consistent with mild post infectious or inflammatory scarring. Minimal changes of early interstitial lung disease are not strictly excluded. Consider follow-up ILD protocol CT in 1 year if interstitial lung disease is clinically suspected. 2. Aortic atherosclerosis. Unchanged enlargement of the tubular ascending thoracic aorta, measuring 4.1 x 4.1 cm. 3.  Coronary artery disease. Electronically Signed   By: Eddie Candle M.D.   On: 06/17/2019 12:42   Xr Hip Unilat W Or W/o Pelvis 2-3 Views Right  Result Date: 07/14/2019 No hip joint narrowing was noted.  No chondrocalcinosis was noted.  No SI joint changes were noted. Impression: Unremarkable x-ray of the hip joint.   Recent Labs: Lab Results  Component Value Date   WBC 3.2 (L) 07/08/2019   HGB 12.2 07/08/2019   PLT 202 07/08/2019   NA 135 07/08/2019    K 3.9 07/08/2019   CL 100 07/08/2019   CO2 27 07/08/2019   GLUCOSE 86 07/08/2019   BUN 18 07/08/2019   CREATININE 0.86 07/08/2019   BILITOT 0.5 07/08/2019   ALKPHOS 80 10/14/2018   AST 22 07/08/2019   ALT 18 07/08/2019   PROT 6.0 (L) 07/08/2019   ALBUMIN 4.0 10/14/2018  CALCIUM 9.3 07/08/2019   GFRAA 77 07/08/2019    Speciality Comments: No specialty comments available.  Procedures:  Large Joint Inj: R subacromial bursa on 07/14/2019 1:30 PM Indications: pain Details: 27 G 1.5 in needle, posterior approach  Arthrogram: No  Medications: 1 mL lidocaine 1 %; 30 mg triamcinolone acetonide 40 MG/ML Aspirate: 0 mL Outcome: tolerated well, no immediate complications Procedure, treatment alternatives, risks and benefits explained, specific risks discussed. Consent was given by the patient. Immediately prior to procedure a time out was called to verify the correct patient, procedure, equipment, support staff and site/side marked as required. Patient was prepped and draped in the usual sterile fashion.   Large Joint Inj: R knee on 07/14/2019 1:31 PM Indications: pain Details: 27 G 1.5 in needle, medial approach  Arthrogram: No  Medications: 1.5 mL lidocaine 1 %; 40 mg triamcinolone acetonide 40 MG/ML Aspirate: 0 mL Outcome: tolerated well, no immediate complications Procedure, treatment alternatives, risks and benefits explained, specific risks discussed. Consent was given by the patient. Immediately prior to procedure a time out was called to verify the correct patient, procedure, equipment, support staff and site/side marked as required. Patient was prepped and draped in the usual sterile fashion.     Allergies: Tramadol and Statins   Assessment / Plan:     Visit Diagnoses: Rheumatoid arthritis involving multiple sites with positive rheumatoid factor (HCC)-patient is having a flare of rheumatoid arthritis with increased pain and discomfort in her right knee joint.  Her right knee  joint was swollen.  She had right subacromial bursitis.  None of the other joints were swollen.  Unfortunately with her history of melanoma she cannot take any other medications.  We had to reduce her dose of methotrexate in the past due to neutropenia.  Her labs have been stable.  My plan is to increase her methotrexate to 0.5 mL subcu weekly along with Arava 10 mg alternating with 20 mg/day as she is doing.  I briefly discussed Rituxan again but she is hesitant to go on the medication.  High risk medications (not anticoagulants) long-term use -methotrexate 0.4 ml every 7 days, folic acid 1 mg 2 tablets daily, and Arava 10 mg 1 tablet daily alternating with 2 tablets daily. Most recent CBC/CMP within normal limits except for low WBC on 07/08/2019.  I will repeat her labs in October due to increase in the methotrexate dose.  Acute pain of right shoulder-examination was consistent with right subacromial bursitis.  After informed consent was obtained and different treatment options were discussed it was injected with cortisone as described above.  Acute pain of right knee-she had warmth and swelling in her right knee joint which was also injected with a steroid as described above.  She tolerated the procedure well.  Pain in right hip -she is concerned that something is going on in her hip joint and it pops in and out.  Her x-ray today was unremarkable.  I have advised her to do some strengthening exercises.  Plan: XR HIP UNILAT W OR W/O PELVIS 2-3 VIEWS RIGHT  Primary osteoarthritis of both hands-joint protection was discussed.  Primary osteoarthritis of both knees-she is currently not having much discomfort in her left knee joint.  Primary osteoarthritis of both feet  History of melanoma-patient is unable to use aggressive Biologics.  Rituxan is the only option.  Postmenopausal-I will schedule DEXA scan.  Patient states that she does not recall when her last bone density was.  Other medical problems  are  listed as follows:  History of cholecystectomy  History of macular degeneration  History of hypertension  History of depression  History of anxiety  History of anemia  History of hypothyroidism  History of asthma  History of basal cell carcinoma  Orders: Orders Placed This Encounter  Procedures  . Large Joint Inj: R subacromial bursa  . Large Joint Inj: R knee  . XR HIP UNILAT W OR W/O PELVIS 2-3 VIEWS RIGHT  . DG Bone Density   Meds ordered this encounter  Medications  . methotrexate 50 MG/2ML injection    Sig: ADMINISTER 0.5 ML UNDER THE SKIN 1 TIME A WEEK    Dispense:  6 mL    Refill:  0     Follow-Up Instructions: Return in about 3 months (around 10/13/2019) for Rheumatoid arthritis, Osteoarthritis.   Bo Merino, MD  Note - This record has been created using Editor, commissioning.  Chart creation errors have been sought, but may not always  have been located. Such creation errors do not reflect on  the standard of medical care.

## 2019-07-14 ENCOUNTER — Encounter: Payer: Self-pay | Admitting: Physician Assistant

## 2019-07-14 ENCOUNTER — Other Ambulatory Visit: Payer: Self-pay

## 2019-07-14 ENCOUNTER — Telehealth: Payer: Self-pay | Admitting: *Deleted

## 2019-07-14 ENCOUNTER — Ambulatory Visit (INDEPENDENT_AMBULATORY_CARE_PROVIDER_SITE_OTHER): Payer: Medicare Other

## 2019-07-14 ENCOUNTER — Ambulatory Visit (INDEPENDENT_AMBULATORY_CARE_PROVIDER_SITE_OTHER): Payer: Medicare Other | Admitting: Rheumatology

## 2019-07-14 VITALS — BP 138/83 | HR 83 | Resp 18 | Ht 59.75 in | Wt 153.2 lb

## 2019-07-14 DIAGNOSIS — Z85828 Personal history of other malignant neoplasm of skin: Secondary | ICD-10-CM

## 2019-07-14 DIAGNOSIS — M19071 Primary osteoarthritis, right ankle and foot: Secondary | ICD-10-CM | POA: Diagnosis not present

## 2019-07-14 DIAGNOSIS — Z8679 Personal history of other diseases of the circulatory system: Secondary | ICD-10-CM

## 2019-07-14 DIAGNOSIS — Z862 Personal history of diseases of the blood and blood-forming organs and certain disorders involving the immune mechanism: Secondary | ICD-10-CM | POA: Diagnosis not present

## 2019-07-14 DIAGNOSIS — M25561 Pain in right knee: Secondary | ICD-10-CM | POA: Diagnosis not present

## 2019-07-14 DIAGNOSIS — Z8659 Personal history of other mental and behavioral disorders: Secondary | ICD-10-CM | POA: Diagnosis not present

## 2019-07-14 DIAGNOSIS — Z9049 Acquired absence of other specified parts of digestive tract: Secondary | ICD-10-CM

## 2019-07-14 DIAGNOSIS — M25511 Pain in right shoulder: Secondary | ICD-10-CM | POA: Diagnosis not present

## 2019-07-14 DIAGNOSIS — M170 Bilateral primary osteoarthritis of knee: Secondary | ICD-10-CM

## 2019-07-14 DIAGNOSIS — Z79899 Other long term (current) drug therapy: Secondary | ICD-10-CM | POA: Diagnosis not present

## 2019-07-14 DIAGNOSIS — M25551 Pain in right hip: Secondary | ICD-10-CM

## 2019-07-14 DIAGNOSIS — M19042 Primary osteoarthritis, left hand: Secondary | ICD-10-CM

## 2019-07-14 DIAGNOSIS — Z85820 Personal history of malignant melanoma of skin: Secondary | ICD-10-CM | POA: Diagnosis not present

## 2019-07-14 DIAGNOSIS — Z8669 Personal history of other diseases of the nervous system and sense organs: Secondary | ICD-10-CM

## 2019-07-14 DIAGNOSIS — Z8709 Personal history of other diseases of the respiratory system: Secondary | ICD-10-CM

## 2019-07-14 DIAGNOSIS — M19072 Primary osteoarthritis, left ankle and foot: Secondary | ICD-10-CM

## 2019-07-14 DIAGNOSIS — M19041 Primary osteoarthritis, right hand: Secondary | ICD-10-CM

## 2019-07-14 DIAGNOSIS — Z780 Asymptomatic menopausal state: Secondary | ICD-10-CM

## 2019-07-14 DIAGNOSIS — M0579 Rheumatoid arthritis with rheumatoid factor of multiple sites without organ or systems involvement: Secondary | ICD-10-CM | POA: Diagnosis not present

## 2019-07-14 DIAGNOSIS — Z8639 Personal history of other endocrine, nutritional and metabolic disease: Secondary | ICD-10-CM

## 2019-07-14 MED ORDER — LIDOCAINE HCL 1 % IJ SOLN
1.0000 mL | INTRAMUSCULAR | Status: AC | PRN
  Administered 2019-07-14: 1 mL

## 2019-07-14 MED ORDER — TRIAMCINOLONE ACETONIDE 40 MG/ML IJ SUSP
40.0000 mg | INTRAMUSCULAR | Status: AC | PRN
  Administered 2019-07-14: 40 mg via INTRA_ARTICULAR

## 2019-07-14 MED ORDER — LIDOCAINE HCL 1 % IJ SOLN
1.5000 mL | INTRAMUSCULAR | Status: AC | PRN
  Administered 2019-07-14: 1.5 mL

## 2019-07-14 MED ORDER — TRIAMCINOLONE ACETONIDE 40 MG/ML IJ SUSP
30.0000 mg | INTRAMUSCULAR | Status: AC | PRN
  Administered 2019-07-14: 30 mg via INTRA_ARTICULAR

## 2019-07-14 MED ORDER — METHOTREXATE SODIUM CHEMO INJECTION 50 MG/2ML: INTRAMUSCULAR | 0 refills | Status: DC

## 2019-07-14 NOTE — Patient Instructions (Addendum)
Standing Labs We placed an order today for your standing lab work.    Please come back and get your standing labs in October and every 3 months   We have open lab daily Monday through Thursday from 8:30-12:30 PM and 1:30-4:30 PM and Friday from 8:30-12:30 PM and 1:30 -4:00 PM at the office of Dr. Isai Gottlieb.   You may experience shorter wait times on Monday and Friday afternoons. The office is located at [address], [address], [place], [place] [address] No appointment is necessary.   Labs are drawn by [facility].  You may receive a bill from [facility] for your lab work.  If you wish to have your labs drawn at another location, please call the office 24 hours in advance to send orders.  If you have any questions regarding directions or hours of operation,  please call [phone].   Just as a reminder please drink plenty of water prior to coming for your lab work. Thanks!   

## 2019-07-14 NOTE — Telephone Encounter (Signed)
I called patient, Dr. Estanislado Pandy advised patient to return in 1 month for labs due to increase in medication.

## 2019-07-28 ENCOUNTER — Other Ambulatory Visit: Payer: Self-pay | Admitting: Physician Assistant

## 2019-07-28 ENCOUNTER — Ambulatory Visit: Payer: Medicare Other | Admitting: Rheumatology

## 2019-07-28 NOTE — Telephone Encounter (Signed)
Last Visit: 07/14/19 Next Visit: 10/09/19 Labs: 07/08/19 stable  Okay to refill per Dr. Estanislado Pandy

## 2019-08-03 ENCOUNTER — Emergency Department (HOSPITAL_BASED_OUTPATIENT_CLINIC_OR_DEPARTMENT_OTHER): Payer: Medicare Other

## 2019-08-03 ENCOUNTER — Emergency Department (HOSPITAL_BASED_OUTPATIENT_CLINIC_OR_DEPARTMENT_OTHER)
Admission: EM | Admit: 2019-08-03 | Discharge: 2019-08-03 | Disposition: A | Discharge status: Home / Self Care | Payer: Medicare Other | Attending: Emergency Medicine | Admitting: Emergency Medicine

## 2019-08-03 ENCOUNTER — Encounter (HOSPITAL_BASED_OUTPATIENT_CLINIC_OR_DEPARTMENT_OTHER): Payer: Self-pay

## 2019-08-03 ENCOUNTER — Ambulatory Visit (INDEPENDENT_AMBULATORY_CARE_PROVIDER_SITE_OTHER): Payer: Medicare Other | Admitting: Family Medicine

## 2019-08-03 ENCOUNTER — Other Ambulatory Visit: Payer: Self-pay

## 2019-08-03 ENCOUNTER — Encounter: Payer: Self-pay | Admitting: Family Medicine

## 2019-08-03 DIAGNOSIS — I10 Essential (primary) hypertension: Secondary | ICD-10-CM | POA: Diagnosis not present

## 2019-08-03 DIAGNOSIS — M797 Fibromyalgia: Secondary | ICD-10-CM | POA: Diagnosis not present

## 2019-08-03 DIAGNOSIS — R079 Chest pain, unspecified: Secondary | ICD-10-CM

## 2019-08-03 DIAGNOSIS — M069 Rheumatoid arthritis, unspecified: Secondary | ICD-10-CM | POA: Insufficient documentation

## 2019-08-03 DIAGNOSIS — Z79899 Other long term (current) drug therapy: Secondary | ICD-10-CM | POA: Insufficient documentation

## 2019-08-03 DIAGNOSIS — Z885 Allergy status to narcotic agent status: Secondary | ICD-10-CM | POA: Diagnosis not present

## 2019-08-03 DIAGNOSIS — Z87891 Personal history of nicotine dependence: Secondary | ICD-10-CM | POA: Diagnosis not present

## 2019-08-03 DIAGNOSIS — E039 Hypothyroidism, unspecified: Secondary | ICD-10-CM | POA: Diagnosis not present

## 2019-08-03 DIAGNOSIS — Z85828 Personal history of other malignant neoplasm of skin: Secondary | ICD-10-CM | POA: Insufficient documentation

## 2019-08-03 DIAGNOSIS — Z7982 Long term (current) use of aspirin: Secondary | ICD-10-CM | POA: Diagnosis not present

## 2019-08-03 DIAGNOSIS — R42 Dizziness and giddiness: Secondary | ICD-10-CM | POA: Diagnosis not present

## 2019-08-03 DIAGNOSIS — R0789 Other chest pain: Secondary | ICD-10-CM | POA: Diagnosis not present

## 2019-08-03 DIAGNOSIS — Z888 Allergy status to other drugs, medicaments and biological substances status: Secondary | ICD-10-CM | POA: Diagnosis not present

## 2019-08-03 DIAGNOSIS — R9431 Abnormal electrocardiogram [ECG] [EKG]: Secondary | ICD-10-CM | POA: Diagnosis not present

## 2019-08-03 DIAGNOSIS — R072 Precordial pain: Secondary | ICD-10-CM | POA: Diagnosis not present

## 2019-08-03 LAB — CBC WITH DIFFERENTIAL/PLATELET
Abs Immature Granulocytes: 0.01 10*3/uL (ref 0.00–0.07)
Basophils Absolute: 0 10*3/uL (ref 0.0–0.1)
Basophils Relative: 1 %
Eosinophils Absolute: 0.1 10*3/uL (ref 0.0–0.5)
Eosinophils Relative: 2 %
HCT: 37.2 % (ref 36.0–46.0)
Hemoglobin: 12.2 g/dL (ref 12.0–15.0)
Immature Granulocytes: 0 %
Lymphocytes Relative: 33 %
Lymphs Abs: 1.1 10*3/uL (ref 0.7–4.0)
MCH: 34.1 pg — ABNORMAL HIGH (ref 26.0–34.0)
MCHC: 32.8 g/dL (ref 30.0–36.0)
MCV: 103.9 fL — ABNORMAL HIGH (ref 80.0–100.0)
Monocytes Absolute: 0.2 10*3/uL (ref 0.1–1.0)
Monocytes Relative: 7 %
Neutro Abs: 1.9 10*3/uL (ref 1.7–7.7)
Neutrophils Relative %: 57 %
Platelets: 195 10*3/uL (ref 150–400)
RBC: 3.58 MIL/uL — ABNORMAL LOW (ref 3.87–5.11)
RDW: 16.2 % — ABNORMAL HIGH (ref 11.5–15.5)
WBC: 3.3 10*3/uL — ABNORMAL LOW (ref 4.0–10.5)
nRBC: 0.6 % — ABNORMAL HIGH (ref 0.0–0.2)

## 2019-08-03 LAB — TROPONIN I (HIGH SENSITIVITY)
Troponin I (High Sensitivity): 7 ng/L (ref <18)
Troponin I (High Sensitivity): 8 ng/L (ref <18)

## 2019-08-03 LAB — URINALYSIS, ROUTINE W REFLEX MICROSCOPIC
Bilirubin Urine: NEGATIVE
Glucose, UA: NEGATIVE mg/dL
Hgb urine dipstick: NEGATIVE
Ketones, ur: NEGATIVE mg/dL
Nitrite: NEGATIVE
Protein, ur: NEGATIVE mg/dL
Specific Gravity, Urine: 1.015 (ref 1.005–1.030)
pH: 6 (ref 5.0–8.0)

## 2019-08-03 LAB — BASIC METABOLIC PANEL
Anion gap: 9 (ref 5–15)
BUN: 17 mg/dL (ref 8–23)
CO2: 25 mmol/L (ref 22–32)
Calcium: 8.7 mg/dL — ABNORMAL LOW (ref 8.9–10.3)
Chloride: 99 mmol/L (ref 98–111)
Creatinine, Ser: 0.9 mg/dL (ref 0.44–1.00)
GFR calc Af Amer: >60 mL/min (ref >60)
GFR calc non Af Amer: >60 mL/min (ref >60)
Glucose, Bld: 152 mg/dL — ABNORMAL HIGH (ref 70–99)
Potassium: 3.3 mmol/L — ABNORMAL LOW (ref 3.5–5.1)
Sodium: 133 mmol/L — ABNORMAL LOW (ref 135–145)

## 2019-08-03 LAB — URINALYSIS, MICROSCOPIC (REFLEX)

## 2019-08-03 NOTE — Discharge Instructions (Addendum)
Follow-up with cardiology as discussed.  Please return to the ED if your symptoms worsen.

## 2019-08-03 NOTE — Progress Notes (Signed)
[facility] [facility] [address] [place], [place] [address] [phone]  Date:  08/03/2019   Name:  Katherine Wang   DOB:  [birth year]   MRN:  [id number]  PCP:  [name], MD    Chief Complaint: No chief complaint on file.   History of Present Illness:  Katherine Wang is a 73 y.o. very pleasant female patient who presents with the following:  Virtual visit today for follow-up Pt location is home, provider is at office Pt ID confirmed with 2 factors, she gives consent for virtual visit today History of RA, melanoma, mitral regurg, hyperlipidemia, hypertension, stable aortic aneurysm most recently imaged on CT in August of this year   Visit with rheumatology about 3 weeks ago Seen by myself in June following up admission for CAP  She notes that last Thursday (today is Monday) she was out clothes shopping when she suddenly felt faint and dizzy.  She had eaten so she did not think it was low blood sugar She was sweaty  She felt like she had better sit down or she might fall down.  She was nauseated.  She laid down on the floor in the dressing room and her sx passed.  She thinks these sx passed after about 10 minutes  She did not have any SOB She went home and rested She had a similar episode the next day- this was more just dizziness, not quite as severe Yesterday she finished a shower and felt really dizzy- she was nauseated, she was not sure if she was sweaty or not as she just got out of the shower.   She laid down again and recovered  She has a known history of chest pain but this is becoming more frequent over the last 3-4 weeks.  May last 5- 10 seconds per episode She did a stress test in 2017- looked ok The CP is not new, but is occurring "right much more often" than it did in the past.    She has a pulse oximeter and she has noted oxygen in the 96+ range Pulse has been up to 110 today She did not check her BP  today  Today she is feeling "sort of weak," but no acute sx No current chest pain or shortness of breath  Her grandson is getting married [place] the beach this [place].  There may be 93 guests there as well as staff She is feeling nervous about this as she is afraid people will not be wearing masks.  She is also not sure if she is ok to drive to the beach on her own  No fever No CP now   Pulse Readings from Last 3 Encounters:  07/14/19 83  05/13/19 84  04/27/19 82     Patient Active Problem List   Diagnosis Date Noted  . Chest pain 04/04/2019  . Anxiety 04/04/2019  . Leukopenia 04/04/2019  . Pneumonia 04/04/2019  . Dyspnea on exertion   . Moderate aortic regurgitation 11/02/2017  . Lightheadedness 11/02/2017  . Tachycardia 07/26/2017  . Atypical chest pain 07/26/2017  . High risk medications (not anticoagulants) long-term use 01/16/2017  . Baker's cyst of knee, left 12/19/2016  . Pain in both knees 12/19/2016  . Pain of both elbows 12/19/2016  . Malignant melanoma ([place]) 12/19/2016  . Basal cell carcinoma 12/19/2016  . Chronic pansinusitis 08/15/2016  . Deviated septum 08/15/2016  . Epistaxis 08/15/2016  .  Hypertrophy, nasal, turbinate 08/15/2016  . Upper airway cough syndrome 04/25/2016  . Hx of adenomatous colonic polyps 06/21/2015  . Hyperlipidemia 11/15/2014  . Rheumatoid arthritis ([place]) 09/01/2014  . Pericarditis 04/24/2011  . Aortic valve disorder 02/14/2009  . ELECTROCARDIOGRAM, ABNORMAL 02/14/2009  . PERNICIOUS ANEMIA 07/10/2007  . DEPRESSION 07/03/2007  . MALAISE AND FATIGUE 07/03/2007  . Hypothyroidism 04/30/2007  . Essential hypertension 04/30/2007  . Aneurysm of thoracic aorta ([place]) 02/13/2006    Past Medical History:  Diagnosis Date  . Adenomatous colon polyp 2012  . Allergy   . Anxiety   . Aortic insufficiency   . Arthritis   . Basal cell carcinoma   . Cataract   . Depression   . Diverticulosis   . Fibromyalgia   . Fibromyalgia   .  Hyperlipidemia   . Hypertension   . Macular degeneration   . Melanoma ([place])   . Murmur, heart   . Reflux   . Stroke ([place]) 20 years ago   per pt. mild stroke  . Thyroid disease    hypothroidism     Past Surgical History:  Procedure Laterality Date  . ABDOMINAL HYSTERECTOMY    . APPENDECTOMY    . BASAL CELL CARCINOMA EXCISION     nose   . CARDIAC CATHETERIZATION    . CHOLECYSTECTOMY    . COLONOSCOPY     30 + years ago, unsure of where  . EYE SURGERY    . LASIK Bilateral   . MELANOMA EXCISION    . NASAL SEPTUM SURGERY    . TUBAL LIGATION      Social History   Tobacco Use  . Smoking status: Former Smoker    Packs/day: 2.00    Years: 20.00    Pack years: 40.00    Types: Cigarettes    Quit date: 10/30/1983    Years since quitting: 35.7  . Smokeless tobacco: Never Used  Substance Use Topics  . Alcohol use: No    Alcohol/week: 0.0 standard drinks  . Drug use: Never    Family History  Problem Relation Age of Onset  . Heart disease Mother   . Hypertension Mother   . Mental illness Mother   . Allergies Mother   . Heart disease Father   . Hypertension Father   . Mental illness Father   . Emphysema Father        smoked  . Allergies Father   . Heart attack Father   . Hyperlipidemia Sister   . Hypertension Sister   . Allergies Sister   . Hypertension Sister   . Diabetes Sister   . Allergies Sister   . Epilepsy Son   . Aneurysm Son   . Hyperthyroidism Daughter   . Hypothyroidism Daughter   . Hypertension Daughter   . Colon cancer Neg Hx   . Esophageal cancer Neg Hx   . Rectal cancer Neg Hx   . Stomach cancer Neg Hx   . Pancreatic cancer Neg Hx   . Prostate cancer Neg Hx     Allergies  Allergen Reactions  . Tramadol Other (See Comments)    dizzy  . Statins     Body ache    Medication list has been reviewed and updated.  Current Outpatient Medications on File Prior to Visit  Medication Sig Dispense Refill  . albuterol (PROVENTIL HFA;VENTOLIN HFA)  108 (90 Base) MCG/ACT inhaler Inhale 2 puffs into the lungs every 6 (six) hours as needed for wheezing or shortness of breath. 18 g  2  . amLODipine (NORVASC) 10 MG tablet Take 1 tablet (10 mg total) by mouth daily. At bedtime 90 tablet 3  . aspirin EC 81 MG tablet Take 1 tablet (81 mg total) by mouth daily. 90 tablet 3  . carvedilol (COREG) 3.125 MG tablet Take 1 tablet (3.125 mg total) by mouth 2 (two) times daily with a meal. 180 tablet 3  . cholecalciferol (VITAMIN D3) 25 MCG (1000 UT) tablet Take 2,000 Units by mouth daily.    . clonazePAM (KLONOPIN) 0.5 MG tablet TAKE 1 TABLET BY MOUTH AT BEDTIME. MAY TAKE 2 TIMES DAILY AS NEEDED ON OCCASION 60 tablet 2  . Cyanocobalamin (VITAMIN B 12 PO) Take 1 tablet by mouth daily.    . cycloSPORINE (RESTASIS) 0.05 % ophthalmic emulsion Place 1 drop into both eyes 2 (two) times daily.     . famotidine (PEPCID) 20 MG tablet Take 1 tablet (20 mg total) by mouth daily. 30 tablet 5  . folic acid (FOLVITE) 1 MG tablet TAKE 1 TABLET BY MOUTH DAILY 90 tablet 3  . hydrochlorothiazide (HYDRODIURIL) 12.5 MG tablet TK 1 T PO D    . hydrochlorothiazide (HYDRODIURIL) 25 MG tablet Take 12.5 mg by mouth daily.     Marland Kitchen ibuprofen (ADVIL,MOTRIN) 200 MG tablet Take 600 mg by mouth 2 (two) times daily as needed for mild pain.    Marland Kitchen irbesartan (AVAPRO) 300 MG tablet Take 1 tablet (300 mg total) by mouth daily. 30 tablet 11  . leflunomide (ARAVA) 10 MG tablet TAKE 1 TABLET BY MOUTH ONCE EVERY OTHER DAY ALTERNATING WITH 2 TABLETS 45 tablet 2  . methotrexate 50 MG/2ML injection ADMINISTER 0.5 ML UNDER THE SKIN 1 TIME A WEEK 6 mL 0  . Multiple Vitamins-Minerals (PRESERVISION AREDS) TABS Take 1 tablet by mouth 2 (two) times daily.    . rosuvastatin (CRESTOR) 10 MG tablet Take 1 tablet (10 mg total) by mouth daily.    . sertraline (ZOLOFT) 100 MG tablet TAKE 2 TABLETS(200 MG) BY MOUTH DAILY 180 tablet 1  . SYNTHROID 112 MCG tablet TAKE 1 TABLET(112 MCG) BY MOUTH DAILY (Patient taking  differently: Take 112 mcg by mouth daily. ) 30 tablet 6  . TUBERCULIN SYR 1CC/27GX1/2" (SAFETY-LOK TB SYRINGE 27GX.5") 27G X 1/2" 1 ML MISC USE AS DIRECTED 12 each 3   No current facility-administered medications on file prior to visit.     Review of Systems:  As per HPI- otherwise negative.   Physical Examination: There were no vitals filed for this visit. There were no vitals filed for this visit. There is no height or weight on file to calculate BMI. Ideal Body Weight:    Pt observed over video- she looks well  No cough or SOB is apparent.   Assessment and Plan: Feeling faint  Chest pain, unspecified type  Virtual visit today for episodes of feeling faint and chest pain. I advised and that she should go to the emergency room right away for evaluation.  Her symptoms may represent unstable angina, or other dangerous pathology.  However at this time Kaela is uncertain about going to the ER.  She is concerned about cost.  She wonders if she can see her cardiologist as an outpatient. I reiterated that I think the ER is her safest option at this time.  However I will also send a message to her cardiologist, Dr. Aundra Dubin, and inquire if he might be able to see her this week.  She promises me that if her chest  pain returns or she is feeling worse she will go to the ER right away  We discussed her concerns about the wedding.  The event will be outdoors.  I advised her that if she is wearing a mask, and maintains several feet between herself and others, she is relatively unlikely to contract COVID of this event.  However certainly if she does not feel safe I think she is justified in not attending the event  Signed Lamar Blinks, MD

## 2019-08-03 NOTE — ED Triage Notes (Signed)
Pt has been having episodes of lightheadedness, dizziness, diaphoresis, indigestion feeling, intermittent CP, generalized weakness for a week.

## 2019-08-03 NOTE — ED Provider Notes (Signed)
[facility] Provider Note   CSN: [id number] Arrival date & time: 08/03/19  1914     History   Chief Complaint Chief Complaint  Patient presents with  . Chest Pain    HPI Katherine Wang is a 73 y.o. female.     The history is provided by the patient.  Chest Pain Pain location:  Substernal area Pain quality: aching and dull   Pain radiates to:  Does not radiate Onset quality:  Gradual Timing:  Intermittent Progression:  Waxing and waning Chronicity:  Recurrent Context: at rest   Relieved by:  Nothing Worsened by:  Movement Associated symptoms: no abdominal pain, no altered mental status, no anorexia, no anxiety, no back pain, no cough, no fever, no palpitations, no shortness of breath and no vomiting   Risk factors: high cholesterol and hypertension   Risk factors: no coronary artery disease and no prior DVT/PE     Past Medical History:  Diagnosis Date  . Adenomatous colon polyp 2012  . Allergy   . Anxiety   . Aortic insufficiency   . Arthritis   . Basal cell carcinoma   . Cataract   . Depression   . Diverticulosis   . Fibromyalgia   . Fibromyalgia   . Hyperlipidemia   . Hypertension   . Macular degeneration   . Melanoma ([place])   . Murmur, heart   . Reflux   . Stroke ([place]) 20 years ago   per pt. mild stroke  . Thyroid disease    hypothroidism     Patient Active Problem List   Diagnosis Date Noted  . Chest pain 04/04/2019  . Anxiety 04/04/2019  . Leukopenia 04/04/2019  . Pneumonia 04/04/2019  . Dyspnea on exertion   . Moderate aortic regurgitation 11/02/2017  . Lightheadedness 11/02/2017  . Tachycardia 07/26/2017  . Atypical chest pain 07/26/2017  . High risk medications (not anticoagulants) long-term use 01/16/2017  . Baker's cyst of knee, left 12/19/2016  . Pain in both knees 12/19/2016  . Pain of both elbows 12/19/2016  . Malignant melanoma ([place]) 12/19/2016  . Basal cell carcinoma 12/19/2016  . Chronic  pansinusitis 08/15/2016  . Deviated septum 08/15/2016  . Epistaxis 08/15/2016  . Hypertrophy, nasal, turbinate 08/15/2016  . Upper airway cough syndrome 04/25/2016  . Hx of adenomatous colonic polyps 06/21/2015  . Hyperlipidemia 11/15/2014  . Rheumatoid arthritis ([place]) 09/01/2014  . Pericarditis 04/24/2011  . Aortic valve disorder 02/14/2009  . ELECTROCARDIOGRAM, ABNORMAL 02/14/2009  . PERNICIOUS ANEMIA 07/10/2007  . DEPRESSION 07/03/2007  . MALAISE AND FATIGUE 07/03/2007  . Hypothyroidism 04/30/2007  . Essential hypertension 04/30/2007  . Aneurysm of thoracic aorta ([place]) 02/13/2006    Past Surgical History:  Procedure Laterality Date  . ABDOMINAL HYSTERECTOMY    . APPENDECTOMY    . BASAL CELL CARCINOMA EXCISION     nose   . CARDIAC CATHETERIZATION    . CHOLECYSTECTOMY    . COLONOSCOPY     30 + years ago, unsure of where  . EYE SURGERY    . LASIK Bilateral   . MELANOMA EXCISION    . NASAL SEPTUM SURGERY    . TUBAL LIGATION       OB History   No obstetric history on file.      Home Medications    Prior to Admission medications   Medication Sig Start Date End Date Taking? Authorizing Provider  albuterol (PROVENTIL HFA;VENTOLIN HFA) 108 (90 Base) MCG/ACT inhaler Inhale 2 puffs into the  lungs every 6 (six) hours as needed for wheezing or shortness of breath. 04/09/16   Copland, Gay Filler, MD  amLODipine (NORVASC) 10 MG tablet Take 1 tablet (10 mg total) by mouth daily. At bedtime 01/20/19   Larey Dresser, MD  aspirin EC 81 MG tablet Take 1 tablet (81 mg total) by mouth daily. 04/20/16   Larey Dresser, MD  carvedilol (COREG) 3.125 MG tablet Take 1 tablet (3.125 mg total) by mouth 2 (two) times daily with a meal. 02/24/19   Larey Dresser, MD  cholecalciferol (VITAMIN D3) 25 MCG (1000 UT) tablet Take 2,000 Units by mouth daily.    [provider]  clonazePAM (KLONOPIN) 0.5 MG tablet TAKE 1 TABLET BY MOUTH AT BEDTIME. MAY TAKE 2 TIMES DAILY AS NEEDED ON  OCCASION 07/08/19   Copland, Gay Filler, MD  Cyanocobalamin (VITAMIN B 12 PO) Take 1 tablet by mouth daily.    [provider]  cycloSPORINE (RESTASIS) 0.05 % ophthalmic emulsion Place 1 drop into both eyes 2 (two) times daily.     [provider]  famotidine (PEPCID) 20 MG tablet Take 1 tablet (20 mg total) by mouth daily. 05/08/19   Copland, Gay Filler, MD  folic acid (FOLVITE) 1 MG tablet TAKE 1 TABLET BY MOUTH DAILY 05/13/19   Ofilia Neas, PA-C  hydrochlorothiazide (HYDRODIURIL) 12.5 MG tablet TK 1 T PO D 06/24/19   [provider]  hydrochlorothiazide (HYDRODIURIL) 25 MG tablet Take 12.5 mg by mouth daily.  11/17/18   [provider]  ibuprofen (ADVIL,MOTRIN) 200 MG tablet Take 600 mg by mouth 2 (two) times daily as needed for mild pain.    [provider]  irbesartan (AVAPRO) 300 MG tablet Take 1 tablet (300 mg total) by mouth daily. 06/01/19   Larey Dresser, MD  leflunomide (ARAVA) 10 MG tablet TAKE 1 TABLET BY MOUTH ONCE EVERY OTHER DAY ALTERNATING WITH 2 TABLETS 07/28/19   Bo Merino, MD  methotrexate 50 MG/2ML injection ADMINISTER 0.5 ML UNDER THE SKIN 1 TIME A WEEK 07/14/19   Bo Merino, MD  Multiple Vitamins-Minerals (PRESERVISION AREDS) TABS Take 1 tablet by mouth 2 (two) times daily.    [provider]  rosuvastatin (CRESTOR) 10 MG tablet Take 1 tablet (10 mg total) by mouth daily. 04/04/19   Geradine Girt, DO  sertraline (ZOLOFT) 100 MG tablet TAKE 2 TABLETS(200 MG) BY MOUTH DAILY 05/26/19   Copland, Gay Filler, MD  SYNTHROID 112 MCG tablet TAKE 1 TABLET(112 MCG) BY MOUTH DAILY Patient taking differently: Take 112 mcg by mouth daily.  03/25/19   Copland, Gay Filler, MD  TUBERCULIN SYR 1CC/27GX1/2" (SAFETY-LOK TB SYRINGE 27GX.5") 27G X 1/2" 1 ML MISC USE AS DIRECTED 02/24/19   Bo Merino, MD    Family History Family History  Problem Relation Age of Onset  . Heart disease Mother   . Hypertension Mother   . Mental  illness Mother   . Allergies Mother   . Heart disease Father   . Hypertension Father   . Mental illness Father   . Emphysema Father        smoked  . Allergies Father   . Heart attack Father   . Hyperlipidemia Sister   . Hypertension Sister   . Allergies Sister   . Hypertension Sister   . Diabetes Sister   . Allergies Sister   . Epilepsy Son   . Aneurysm Son   . Hyperthyroidism Daughter   . Hypothyroidism Daughter   .  Hypertension Daughter   . Colon cancer Neg Hx   . Esophageal cancer Neg Hx   . Rectal cancer Neg Hx   . Stomach cancer Neg Hx   . Pancreatic cancer Neg Hx   . Prostate cancer Neg Hx     Social History Social History   Tobacco Use  . Smoking status: Former Smoker    Packs/day: 2.00    Years: 20.00    Pack years: 40.00    Types: Cigarettes    Quit date: 10/30/1983    Years since quitting: 35.7  . Smokeless tobacco: Never Used  Substance Use Topics  . Alcohol use: No    Alcohol/week: 0.0 standard drinks  . Drug use: Never     Allergies   Tramadol and Statins   Review of Systems Review of Systems  Constitutional: Negative for chills and fever.  HENT: Negative for ear pain and sore throat.   Eyes: Negative for pain and visual disturbance.  Respiratory: Negative for cough and shortness of breath.   Cardiovascular: Positive for chest pain. Negative for palpitations.  Gastrointestinal: Negative for abdominal pain, anorexia and vomiting.  Genitourinary: Negative for dysuria and hematuria.  Musculoskeletal: Negative for arthralgias and back pain.  Skin: Negative for color change and rash.  Neurological: Negative for seizures and syncope.  All other systems reviewed and are negative.    Physical Exam Updated Vital Signs  ED Triage Vitals  Enc Vitals Group     BP 08/03/19 1931 (!) 167/101     Pulse Rate 08/03/19 1931 98     Resp 08/03/19 1931 20     Temp 08/03/19 1931 98.2 F (36.8 C)     Temp Source 08/03/19 1931 Oral     SpO2 08/03/19  1931 98 %     Weight 08/03/19 1932 148 lb (67.1 kg)     Height 08/03/19 1932 4' 11.75" (1.518 m)     Head Circumference --      Peak Flow --      Pain Score 08/03/19 1932 1     Pain Loc --      Pain Edu? --      Excl. in [place]? --     Physical Exam Vitals signs and nursing note reviewed.  Constitutional:      General: She is not in acute distress.    Appearance: She is well-developed.  HENT:     Head: Normocephalic and atraumatic.  Eyes:     Extraocular Movements: Extraocular movements intact.     Conjunctiva/sclera: Conjunctivae normal.     Pupils: Pupils are equal, round, and reactive to light.  Neck:     Musculoskeletal: Normal range of motion and neck supple.  Cardiovascular:     Rate and Rhythm: Normal rate and regular rhythm.     Pulses:          Radial pulses are 2+ on the right side and 2+ on the left side.     Heart sounds: Normal heart sounds. No murmur.  Pulmonary:     Effort: Pulmonary effort is normal. No respiratory distress.     Breath sounds: Normal breath sounds. No decreased breath sounds, wheezing, rhonchi or rales.  Chest:     Chest wall: Tenderness present.  Abdominal:     Palpations: Abdomen is soft.     Tenderness: There is no abdominal tenderness.  Musculoskeletal: Normal range of motion.     Right lower leg: No edema.     Left lower leg: No  edema.  Skin:    General: Skin is warm and dry.     Capillary Refill: Capillary refill takes less than 2 seconds.  Neurological:     General: No focal deficit present.     Mental Status: She is alert.  Psychiatric:        Mood and Affect: Mood normal.      ED Treatments / Results  Labs (all labs ordered are listed, but only abnormal results are displayed) Labs Reviewed  CBC WITH DIFFERENTIAL/PLATELET - Abnormal; Notable for the following components:      Result Value   WBC 3.3 (*)    RBC 3.58 (*)    MCV 103.9 (*)    MCH 34.1 (*)    RDW 16.2 (*)    nRBC 0.6 (*)    All other components within  normal limits  BASIC METABOLIC PANEL - Abnormal; Notable for the following components:   Sodium 133 (*)    Potassium 3.3 (*)    Glucose, Bld 152 (*)    Calcium 8.7 (*)    All other components within normal limits  URINALYSIS, ROUTINE W REFLEX MICROSCOPIC - Abnormal; Notable for the following components:   Leukocytes,Ua SMALL (*)    All other components within normal limits  URINALYSIS, MICROSCOPIC (REFLEX) - Abnormal; Notable for the following components:   Bacteria, UA RARE (*)    All other components within normal limits  TROPONIN I (HIGH SENSITIVITY)  TROPONIN I (HIGH SENSITIVITY)    EKG EKG Interpretation  Date/Time:  Monday August 03 2019 19:29:41 EDT Ventricular Rate:  89 PR Interval:  142 QRS Duration: 92 QT Interval:  358 QTC Calculation: 435 R Axis:   24 Text Interpretation:  Normal sinus rhythm with sinus arrhythmia ST & T wave abnormality, consider inferolateral ischemia Abnormal ECG Confirmed by Lennice Sites [id number]) on 08/03/2019 7:39:14 PM   Radiology Dg Chest Portable 1 View  Result Date: 08/03/2019 CLINICAL DATA:  Chest pain EXAM: PORTABLE CHEST 1 VIEW COMPARISON:  April 04, 2019 FINDINGS: The heart size and mediastinal contours are within normal limits. Aortic knob calcifications. Both lungs are clear. The visualized skeletal structures are unremarkable. IMPRESSION: No acute cardiopulmonary process. Electronically Signed   By: Prudencio Pair M.D.   On: 08/03/2019 20:12    Procedures Procedures (including critical care time)  Medications Ordered in ED Medications - No data to display   Initial Impression / Assessment and Plan / ED Course  I have reviewed the triage vital signs and the nursing notes.  Pertinent labs & imaging results that were available during my care of the patient were reviewed by me and considered in my medical decision making (see chart for details).     Katherine Wang is a 73 year old female with history of hypertension, high  cholesterol, fibromyalgia, anxiety who presents to the ED with chest pain.  Patient with normal vitals except for mild hypertension.  Otherwise no fever.  Patient has had some chest pain over the last several weeks on and off.  Last episode was several days ago.  No active chest pain today.  Has follow-up with cardiology this week due to anginal type symptoms.  Was sent by primary care provider today for evaluation.  EKG shows sinus rhythm.  Has some ST depressions inferiorly that are seen on previous EKGs.  Has some T wave inversions laterally that are also seen on prior EKGs.  Overall EKG does not show any new ischemic changes.  Patient denies any shortness of  breath, abdominal pain.  Overall patient is asymptomatic at this time.  She was told to come for troponins.  She does have multiple cardiac risk factors.  Story could be concerning for ACS.  However, patient also has very reproducible chest wall pain in the same area that she does have her chest pain in the past.  Patient does not want to be admitted despite elevated heart score.  No concern for PE. Possible MSK. Shared decision was made to get 2 troponins.  She prefers to follow-up with cardiology outpatient as she does not have any discomfort currently and has felt well for the last several days.  I believe that this is reasonable.  Patient understands the risks and benefits versus admission versus follow-up outpatient.   No significant anemia, electrolyte abnormality, kidney injury.  First troponin is within normal limits.  Chest x-ray showed no signs of pneumonia, no pneumothorax, no pleural effusion.  Awaiting second troponin.  Patient with no active chest pain.  Repeat troponin within normal limits.  Patient prefers to follow-up with cardiology as she already has scheduled.  Has not had any chest pain today or throughout my care.  Understands return precautions.  Shared decision was made for her to follow-up.  Discharged in good condition.  This  chart was dictated using voice recognition software.  Despite best efforts to proofread,  errors can occur which can change the documentation meaning.    Final Clinical Impressions(s) / ED Diagnoses   Final diagnoses:  Nonspecific chest pain    ED Discharge Orders    None       Lennice Sites, DO 08/03/19 2310

## 2019-08-04 ENCOUNTER — Encounter: Payer: Self-pay | Admitting: Family Medicine

## 2019-08-04 NOTE — Telephone Encounter (Signed)
FYI

## 2019-08-07 ENCOUNTER — Encounter (HOSPITAL_COMMUNITY): Payer: Self-pay | Admitting: Cardiology

## 2019-08-07 ENCOUNTER — Ambulatory Visit (HOSPITAL_COMMUNITY)
Admission: RE | Admit: 2019-08-07 | Discharge: 2019-08-07 | Disposition: A | Discharge status: Home / Self Care | Payer: Medicare Other | Source: Ambulatory Visit | Attending: Cardiology | Admitting: Cardiology

## 2019-08-07 ENCOUNTER — Other Ambulatory Visit: Payer: Self-pay

## 2019-08-07 VITALS — BP 160/78 | HR 81 | Wt 150.0 lb

## 2019-08-07 DIAGNOSIS — Z885 Allergy status to narcotic agent status: Secondary | ICD-10-CM | POA: Insufficient documentation

## 2019-08-07 DIAGNOSIS — Z888 Allergy status to other drugs, medicaments and biological substances status: Secondary | ICD-10-CM | POA: Insufficient documentation

## 2019-08-07 DIAGNOSIS — I10 Essential (primary) hypertension: Secondary | ICD-10-CM | POA: Diagnosis not present

## 2019-08-07 DIAGNOSIS — Z833 Family history of diabetes mellitus: Secondary | ICD-10-CM | POA: Insufficient documentation

## 2019-08-07 DIAGNOSIS — R55 Syncope and collapse: Secondary | ICD-10-CM | POA: Diagnosis not present

## 2019-08-07 DIAGNOSIS — R0789 Other chest pain: Secondary | ICD-10-CM

## 2019-08-07 DIAGNOSIS — Z8249 Family history of ischemic heart disease and other diseases of the circulatory system: Secondary | ICD-10-CM | POA: Insufficient documentation

## 2019-08-07 DIAGNOSIS — Z79899 Other long term (current) drug therapy: Secondary | ICD-10-CM | POA: Diagnosis not present

## 2019-08-07 DIAGNOSIS — E785 Hyperlipidemia, unspecified: Secondary | ICD-10-CM | POA: Insufficient documentation

## 2019-08-07 DIAGNOSIS — R9431 Abnormal electrocardiogram [ECG] [EKG]: Secondary | ICD-10-CM | POA: Insufficient documentation

## 2019-08-07 DIAGNOSIS — R079 Chest pain, unspecified: Secondary | ICD-10-CM

## 2019-08-07 DIAGNOSIS — I351 Nonrheumatic aortic (valve) insufficiency: Secondary | ICD-10-CM | POA: Insufficient documentation

## 2019-08-07 DIAGNOSIS — I359 Nonrheumatic aortic valve disorder, unspecified: Secondary | ICD-10-CM

## 2019-08-07 DIAGNOSIS — Z79890 Hormone replacement therapy (postmenopausal): Secondary | ICD-10-CM | POA: Diagnosis not present

## 2019-08-07 DIAGNOSIS — I350 Nonrheumatic aortic (valve) stenosis: Secondary | ICD-10-CM | POA: Diagnosis not present

## 2019-08-07 DIAGNOSIS — Z87891 Personal history of nicotine dependence: Secondary | ICD-10-CM | POA: Diagnosis not present

## 2019-08-07 DIAGNOSIS — M069 Rheumatoid arthritis, unspecified: Secondary | ICD-10-CM | POA: Diagnosis not present

## 2019-08-07 DIAGNOSIS — Z7982 Long term (current) use of aspirin: Secondary | ICD-10-CM | POA: Diagnosis not present

## 2019-08-07 DIAGNOSIS — R42 Dizziness and giddiness: Secondary | ICD-10-CM | POA: Diagnosis not present

## 2019-08-07 MED ORDER — METOPROLOL TARTRATE 50 MG PO TABS: 50.0000 mg | ORAL_TABLET | Freq: Once | ORAL | 0 refills | Status: DC

## 2019-08-07 NOTE — Patient Instructions (Signed)
Your provider has recommended that  you wear a Zio Patch for 14 days.  This monitor will record your heart rhythm for our review.  IF you have any symptoms while wearing the monitor please press the button.  If you have any issues with the patch or you notice a red or orange light on it please call the company at [phone].  Once you remove the patch please mail it back to the company as soon as possible so we can get the results.  Your physician has requested that you have an echocardiogram. Echocardiography is a painless test that uses sound waves to create images of your heart. It provides your doctor with information about the size and shape of your heart and how well your heart's chambers and valves are working. This procedure takes approximately one hour. There are no restrictions for this procedure.   You have been ordered for a CT SCAN of your Heart.  You will get a call to schedule this appointment.  TAKE Metoprolol 50mg  x1 dose 1 hour before your CT SCAN.  CHECK your Blood Pressure daily x2 weeks then call or fax [place] with the readings.  Fax [phone]  Your physician recommends that you schedule a follow-up appointment in: 1 month with Dr Aundra Dubin.   At the [facility], you and your health needs are our priority. As part of our continuing mission to provide you with exceptional heart care, we have created designated Provider Care Teams. These Care Teams include your primary Cardiologist (physician) and Advanced Practice Providers (APPs- Physician Assistants and Nurse Practitioners) who all work together to provide you with the care you need, when you need it.   You may see any of the following providers on your designated Care Team at your next follow up: Marland Kitchen Dr Glori Bickers . Dr Loralie Champagne . Darrick Grinder, NP . Lyda Jester, PA   Please be sure to bring in all your medications bottles to every appointment.

## 2019-08-07 NOTE — Progress Notes (Signed)
Zio patch placed onto patient.  All instructions and information reviewed with patient, they verbalize understanding with no questions. 

## 2019-08-09 NOTE — Progress Notes (Signed)
History of Present Illness: Katherine Wang is a 73 y.o. female who has history of rheumatoid arthritis, aortic insufficiency, mild ascending aortic dilatation, and suspected prior acute pericarditis. Patient's cardiac history begins in 2004 when she had possible acute pericarditis in [place].  LHC at that time showed no significant disease.  In our office, she has been followed for a malformed (though trileaflet) aortic valve.  She has had aortic insufficiency and a mildly dilated ascending aorta.  She moved to [place] in 2012 to help her daughter who has an autistic child.  She is back in [place] now.  She says she had a stress test at some time in 58 in [place].  She thinks it was normal.  Echo in 7/17 showed EF 65-70% with moderate AI.  CTA chest in 6/17 showed 3.9 cm ascending aorta.   She had what sounds like a pericarditis flare in 10/17 and was treated with colchicine and ibuprofen with improvement.  Cardiolite was ordered but she cancelled it.   Echo in 1/19 showed EF 60-65% with mild-moderate AI, mild AS.  CTA chest 2/19 showed 3.9 cm ascending aorta.    Recently, she has had a number of episodes of nausea/diaphoresis and lightheadedness.  These episodes do not seem to have a particular trigger.  However, the worse episode occurred last Thursday when she was struggling to get out of a dress in a store dressing room.  She developed warmth, diaphoresis, and lightheadedness to the point that she had to lie down to stop from passing out.  No palpitations noted.  She also has been having episodes of atypical central chest pain. The pain usually occurs when she is more active, but is fleeting (only lasting for 10 seconds or so).  When she had the episode in the dressing room, she went to the ER.  She is frustrated because she says that they focused on the chest pain and did not address the dizziness.  Troponin was negative and ECG was unchanged.  Her main concern is the  lightheaded/sweating spells.  ECG (personally reviewed): NSR, inferior/lateral slight ST depression (similar to prior).  Labs (6/12): BNP 86, ESR 83 Labs (11/15): K 3.8, creatinine 0.94, TSH normal Labs (6/17): Na 131, K 3.7, creatinine 0.8, LDL 77, HDL 52 Labs (1/19): K 3.9, creatinine 0.91 Labs (12/19): K 3.9, creatinine 0.7, hgb 12.2, LDL 79, HDL 42 Labs (10/20): K 3.3, creatinine 0.9, Hs-TnI normal  PMH: 1. Acute pericarditis in 2004: Hospitalized in [place].  Had LHC at that time that she reports as showing no significant CAD.  Possible recurrent pericarditis 6/12 and 10/17.  2. 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 Patient has a trileaflet but malformed aortic valve.  Last echo (4/10) showed EF 55-60%, mild LV dilation, mild LV hypertrophy, mild aortic insufficiency, mild MR.  Cardiac MRI (4/10) confirmed that the aortic valve was trileaflet but malformed, EF 64%.  - Echo (6/12): EF 50%, moderate (grade II) diastolic dysfunction, mild AI, mild MR, no pericardial effusion.   - Echo (2/16): EF 50-55%, moderate AI, ascending aorta 4.0 cm, mild MR.  - Echo (7/17): EF 65-70%, moderate AI - Echo (1/19): EF 60-65%, mild LVH, mild-moderate AI with mild AS, 4.1 cm ascending aorta, mild MR.  3.  Ascending aorta dilation: MRA chest (4/10) with 4 x 3.6 cm ascending aorta.  CTA chest (7/11) with 3.7 x 3.7 cm ascending aorta and aberrant right subclavian course.  - CTA chest 6/17 with 3.9 cm ascending  aorta.  - CTA chest 2/19 with 3.9 cm ascending aorta.  4.  HTN 5.  Rheumatoid arthritis  6.  H/o rheumatic fever in childhood.  7. Hyperlipidemia 8. Aberrant course right subclavian artery 9. 1/19 carotid doppler: mild bilateral stenosis.   Current Outpatient Medications  Medication Sig Dispense Refill   albuterol (PROVENTIL HFA;VENTOLIN HFA) 108 (90 Base) MCG/ACT inhaler Inhale 2 puffs into the lungs every 6 (six) hours as needed for wheezing or shortness of breath. 18 g 2    amLODipine (NORVASC) 10 MG tablet Take 1 tablet (10 mg total) by mouth daily. At bedtime 90 tablet 3   aspirin EC 81 MG tablet Take 1 tablet (81 mg total) by mouth daily. 90 tablet 3   carvedilol (COREG) 3.125 MG tablet Take 1 tablet (3.125 mg total) by mouth 2 (two) times daily with a meal. 180 tablet 3   cholecalciferol (VITAMIN D3) 25 MCG (1000 UT) tablet Take 2,000 Units by mouth daily.     clonazePAM (KLONOPIN) 0.5 MG tablet TAKE 1 TABLET BY MOUTH AT BEDTIME. MAY TAKE 2 TIMES DAILY AS NEEDED ON OCCASION 60 tablet 2   Cyanocobalamin (VITAMIN B 12 PO) Take 1 tablet by mouth daily.     cycloSPORINE (RESTASIS) 0.05 % ophthalmic emulsion Place 1 drop into both eyes 2 (two) times daily.      famotidine (PEPCID) 20 MG tablet Take 1 tablet (20 mg total) by mouth daily. 30 tablet 5   folic acid (FOLVITE) 1 MG tablet Take 2 mg by mouth daily.     hydrochlorothiazide (HYDRODIURIL) 12.5 MG tablet TK 1 T PO D     ibuprofen (ADVIL,MOTRIN) 200 MG tablet Take 600 mg by mouth 2 (two) times daily as needed for mild pain.     irbesartan (AVAPRO) 300 MG tablet Take 1 tablet (300 mg total) by mouth daily. 30 tablet 11   leflunomide (ARAVA) 10 MG tablet TAKE 1 TABLET BY MOUTH ONCE EVERY OTHER DAY ALTERNATING WITH 2 TABLETS 45 tablet 2   methotrexate 50 MG/2ML injection ADMINISTER 0.5 ML UNDER THE SKIN 1 TIME A WEEK 6 mL 0   Multiple Vitamins-Minerals (PRESERVISION AREDS) TABS Take 1 tablet by mouth 2 (two) times daily.     rosuvastatin (CRESTOR) 10 MG tablet Take 1 tablet (10 mg total) by mouth daily.     sertraline (ZOLOFT) 100 MG tablet TAKE 2 TABLETS(200 MG) BY MOUTH DAILY 180 tablet 1   SYNTHROID 112 MCG tablet TAKE 1 TABLET(112 MCG) BY MOUTH DAILY (Patient taking differently: Take 112 mcg by mouth daily. ) 30 tablet 6   TUBERCULIN SYR 1CC/27GX1/2" (SAFETY-LOK TB SYRINGE 27GX.5") 27G X 1/2" 1 ML MISC USE AS DIRECTED 12 each 3   metoprolol tartrate (LOPRESSOR) 50 MG tablet Take 1 tablet (50  mg total) by mouth once for 1 dose. Take 1 hour before CT scan 1 tablet 0   No current facility-administered medications for this encounter.     Allergies:   Tramadol and Statins   Social History:  The patient  reports that she quit smoking about 35 years ago. Her smoking use included cigarettes. She has a 40.00 pack-year smoking history. She has never used smokeless tobacco. She reports that she does not drink alcohol or use drugs.   Family History:  The patient's family history includes Allergies in her father, mother, sister, and sister; Aneurysm in her son; Diabetes in her sister; Emphysema in her father; Epilepsy in her son; Heart attack in her father; Heart disease  in her father and mother; Hyperlipidemia in her sister; Hypertension in her daughter, father, mother, sister, and sister; Hyperthyroidism in her daughter; Hypothyroidism in her daughter; Mental illness in her father and mother.   ROS:  Please see the history of present illness.   All other systems are personally reviewed and negative.   Exam:   BP (!) 160/78    Pulse 81    Wt 68 kg (150 lb)    SpO2 98%    BMI 29.54 kg/m  General: NAD Neck: No JVD, no thyromegaly or thyroid nodule.  Lungs: Clear to auscultation bilaterally with normal respiratory effort. CV: Nondisplaced PMI.  Heart regular S1/S2, no S3/S4, 1/6 SEM RUSB.  No peripheral edema.  No carotid bruit.  Normal pedal pulses.  Abdomen: Soft, nontender, no hepatosplenomegaly, no distention.  Skin: Intact without lesions or rashes.  Neurologic: Alert and oriented x 3.  Psych: Normal affect. Extremities: No clubbing or cyanosis.  HEENT: Normal.   Recent Labs: 09/22/2018: TSH 1.57 07/08/2019: ALT 18 08/03/2019: BUN 17; Creatinine, Ser 0.90; Hemoglobin 12.2; Platelets 195; Potassium 3.3; Sodium 133  Personally reviewed   Wt Readings from Last 3 Encounters:  08/07/19 68 kg (150 lb)  08/03/19 67.1 kg (148 lb)  07/14/19 69.5 kg (153 lb 3.2 oz)      ASSESSMENT AND  PLAN:  1. Aortic valve disorder: History of malformed aortic valve with mild to moderate AI and mild AS on 1/19 echo. Suspect rheumatic heart disease. - I will arrange for repeat echo.  2. Ascending aorta dilation: Mild on CTA chest in 2/19.  Can repeat in 2 years in 2/21.   3. HTN: BP is high in the office today but she says that is is generally normal at home.  - She will check BP daily x 2 wks and call in readings to me before I increase her meds.   4. Hyperlipidemia: Continue atorvastatin, good lipids in 12/19.   5. Chest pain: Having increased atypical chest pain.  She has an abnormal ECG but this is chronic and unchanged.  - I will arrange for coronary CT angiogram with FFR to assess for significant CAD as cause of her symptoms.  6. Lightheaded/diaphoretic spells: No particular trigger, occurring frequently more recently.  One severe episode last week of presyncope.  Episodes seem almost vasovagal in nature but cannot identify a trigger as above.  She does not note palpitations but cannot rule out tachy or brady-arrhythmia.   - I will arrange for a 2 wk Zio patch to assess for arrhythmias.   Signed, Loralie Champagne, MD  08/09/2019   [facility] [address] [facility] [place] [address] [phone] (office) [phone] (fax)

## 2019-08-10 ENCOUNTER — Telehealth (HOSPITAL_COMMUNITY): Payer: Self-pay

## 2019-08-10 NOTE — Telephone Encounter (Signed)
Pt left vm on triage line with questions regarding zio patch. LM for patient to return call to office to discuss

## 2019-08-11 ENCOUNTER — Telehealth (HOSPITAL_COMMUNITY): Payer: Self-pay

## 2019-08-11 NOTE — Telephone Encounter (Signed)
Katherine Wang called to report her BP and heart rate for today. See below  BP:124/85 HR: 93   After walking vitals  BP138/104 HR: 87

## 2019-08-13 ENCOUNTER — Other Ambulatory Visit: Payer: Self-pay

## 2019-08-13 ENCOUNTER — Ambulatory Visit (HOSPITAL_COMMUNITY)
Admission: RE | Admit: 2019-08-13 | Discharge: 2019-08-13 | Disposition: A | Discharge status: Home / Self Care | Payer: Medicare Other | Source: Ambulatory Visit | Attending: Cardiology | Admitting: Cardiology

## 2019-08-13 DIAGNOSIS — I77819 Aortic ectasia, unspecified site: Secondary | ICD-10-CM | POA: Diagnosis not present

## 2019-08-13 DIAGNOSIS — I080 Rheumatic disorders of both mitral and aortic valves: Secondary | ICD-10-CM | POA: Diagnosis not present

## 2019-08-13 DIAGNOSIS — I359 Nonrheumatic aortic valve disorder, unspecified: Secondary | ICD-10-CM | POA: Diagnosis not present

## 2019-08-13 NOTE — Progress Notes (Signed)
Echocardiogram 2D Echocardiogram has been performed.  Oneal Deputy Harry Bark 08/13/2019, 11:22 AM

## 2019-08-25 ENCOUNTER — Telehealth (HOSPITAL_COMMUNITY): Payer: Self-pay | Admitting: Emergency Medicine

## 2019-08-25 ENCOUNTER — Ambulatory Visit (HOSPITAL_COMMUNITY): Payer: Medicare Other

## 2019-08-25 NOTE — Telephone Encounter (Signed)
Reaching out to patient to offer assistance regarding upcoming cardiac imaging study; pt verbalizes understanding of appt date/time, parking situation and where to check in, pre-test NPO status and medications ordered, and verified current allergies; name and call back number provided for further questions should they arise Gavinn Collard RN Navigator Cardiac [facility] [phone] office [phone] cell 

## 2019-08-26 ENCOUNTER — Other Ambulatory Visit (HOSPITAL_COMMUNITY): Payer: Self-pay

## 2019-08-26 MED ORDER — METOPROLOL TARTRATE 50 MG PO TABS: 50.0000 mg | ORAL_TABLET | Freq: Once | ORAL | 0 refills | Status: DC

## 2019-08-26 NOTE — Telephone Encounter (Signed)
Received message from patient stating that she needs metoprolol called in for CT tomorrow. Med was sent on day of last visit however patient did not get it. New rx sent to pharmacy. Pt aware.

## 2019-08-27 ENCOUNTER — Ambulatory Visit (HOSPITAL_COMMUNITY)
Admission: RE | Admit: 2019-08-27 | Discharge: 2019-08-27 | Disposition: A | Discharge status: Home / Self Care | Payer: Medicare Other | Source: Ambulatory Visit | Attending: Cardiology | Admitting: Cardiology

## 2019-08-27 ENCOUNTER — Telehealth (HOSPITAL_COMMUNITY): Payer: Self-pay | Admitting: Emergency Medicine

## 2019-08-27 ENCOUNTER — Encounter: Payer: Medicare Other | Admitting: *Deleted

## 2019-08-27 ENCOUNTER — Other Ambulatory Visit: Payer: Self-pay

## 2019-08-27 DIAGNOSIS — Z006 Encounter for examination for normal comparison and control in clinical research program: Secondary | ICD-10-CM

## 2019-08-27 DIAGNOSIS — I359 Nonrheumatic aortic valve disorder, unspecified: Secondary | ICD-10-CM | POA: Diagnosis not present

## 2019-08-27 DIAGNOSIS — R079 Chest pain, unspecified: Secondary | ICD-10-CM | POA: Insufficient documentation

## 2019-08-27 MED ORDER — METOPROLOL TARTRATE 5 MG/5ML IV SOLN
INTRAVENOUS | Status: AC
  Filled 2019-08-27: qty 5

## 2019-08-27 MED ORDER — IOHEXOL 350 MG/ML SOLN
80.0000 mL | Freq: Once | INTRAVENOUS | Status: AC | PRN
  Administered 2019-08-27: 80 mL via INTRAVENOUS

## 2019-08-27 MED ORDER — METOPROLOL TARTRATE 5 MG/5ML IV SOLN
5.0000 mg | INTRAVENOUS | Status: DC | PRN
  Administered 2019-08-27: 5 mg via INTRAVENOUS

## 2019-08-27 MED ORDER — NITROGLYCERIN 0.4 MG SL SUBL
SUBLINGUAL_TABLET | SUBLINGUAL | Status: AC
  Filled 2019-08-27: qty 2

## 2019-08-27 MED ORDER — NITROGLYCERIN 0.4 MG SL SUBL
0.8000 mg | SUBLINGUAL_TABLET | Freq: Once | SUBLINGUAL | Status: AC
  Administered 2019-08-27: 0.8 mg via SUBLINGUAL

## 2019-08-27 NOTE — Telephone Encounter (Signed)
Pt calling to clarify some questions; pt appreciated the input; denied further questions  Katherine Wang

## 2019-08-27 NOTE — Research (Signed)
Patient meets criteria for CADFEM trial. Trial was discussed with the patient including risk/benefits. Patient was given opportunity to read consent and ask questions. Consent was signed and copy was given to patient. No study procedures were performed prior to consent.

## 2019-08-28 ENCOUNTER — Telehealth (HOSPITAL_COMMUNITY): Payer: Self-pay | Admitting: Cardiology

## 2019-08-28 DIAGNOSIS — I359 Nonrheumatic aortic valve disorder, unspecified: Secondary | ICD-10-CM | POA: Diagnosis not present

## 2019-08-28 NOTE — Telephone Encounter (Signed)
Ok.  You can let her know that her coronary CTA showed a nonobstructive stenosis in the LAD, do not think that it would cause chest pain, medical management.

## 2019-08-28 NOTE — Telephone Encounter (Signed)
Pt aware of results and voiced understanding. 

## 2019-08-28 NOTE — Telephone Encounter (Signed)
Patient called to report b/p readings 10/22 115/77 HR 96 10/30 136/86 HR 91

## 2019-08-29 DIAGNOSIS — Z87898 Personal history of other specified conditions: Secondary | ICD-10-CM | POA: Diagnosis not present

## 2019-09-03 NOTE — Addendum Note (Signed)
Encounter addended by: Micki Riley, RN on: 09/03/2019 10:05 AM  Actions taken: Imaging Exam ended

## 2019-09-03 NOTE — Addendum Note (Signed)
Encounter addended by: Micki Riley, RN on: 09/03/2019 10:22 AM  Actions taken: Imaging Exam ended

## 2019-09-07 ENCOUNTER — Other Ambulatory Visit (HOSPITAL_COMMUNITY): Payer: Self-pay

## 2019-09-07 ENCOUNTER — Ambulatory Visit (INDEPENDENT_AMBULATORY_CARE_PROVIDER_SITE_OTHER): Payer: Medicare Other | Admitting: Family Medicine

## 2019-09-07 ENCOUNTER — Encounter: Payer: Self-pay | Admitting: Family Medicine

## 2019-09-07 ENCOUNTER — Telehealth (HOSPITAL_COMMUNITY): Payer: Self-pay | Admitting: Cardiology

## 2019-09-07 ENCOUNTER — Other Ambulatory Visit: Payer: Self-pay

## 2019-09-07 DIAGNOSIS — R05 Cough: Secondary | ICD-10-CM

## 2019-09-07 DIAGNOSIS — R059 Cough, unspecified: Secondary | ICD-10-CM

## 2019-09-07 MED ORDER — CARVEDILOL 6.25 MG PO TABS: 6.2500 mg | ORAL_TABLET | Freq: Two times a day (BID) | ORAL | 3 refills | Status: DC

## 2019-09-07 MED ORDER — HYDROCHLOROTHIAZIDE 12.5 MG PO TABS: ORAL_TABLET | ORAL | 3 refills | Status: AC

## 2019-09-07 MED ORDER — HYDROCHLOROTHIAZIDE 12.5 MG PO TABS: ORAL_TABLET | ORAL | 3 refills | Status: DC

## 2019-09-07 NOTE — Telephone Encounter (Signed)
Result Notes for LONG TERM MONITOR (3-14 DAYS)  Notes recorded by Kerry Dory, CMA on 09/07/2019 at 11:31 AM EST  Patient aware. Patient voiced understanding   ------   Notes recorded by Shonna Chock, CMA on [id number] at 11:30 AM EST  Left message to return call  ------   Notes recorded by Larey Dresser, MD on 09/06/2019 at 9:13 PM EST  Short runs of SVT, increase Coreg to 6.25 mg bid.

## 2019-09-07 NOTE — Progress Notes (Signed)
[facility] [address], [place], [place] [address] [phone]  Date:  09/07/2019   Name:  Katherine Wang   DOB:  1946/08/06   MRN:  [id number]  PCP:  [name], MD    Chief Complaint: No chief complaint on file.   History of Present Illness:  Katherine Wang is a 73 y.o. very pleasant female patient who presents with the following:  Virtual visit by phone today to discuss illness I saw and most recently about 1 month ago for a presyncopal episode-at that time I encouraged her to go to the emergency room.  She preferred not to be seen in the ER, and was able to see her cardiologist instead.  Dr. Aundra Dubin saw her in the office on October 9: . Aortic valve disorder: History of malformed aortic valve withmild to moderate AI and mild AS on 1/19 echo. Suspect rheumatic heart disease. - I will arrange for repeat echo.  2. Ascending aorta dilation: Mild on CTA chest in2/19. Can repeat in 2 years in 2/21.  3. HTN:BP is high in the office today but she says that is is generally normal at home.  - She will check BP daily x 2 wks and call in readings to me before I increase her meds.  4. Hyperlipidemia: Continue atorvastatin, good lipids in 12/19.  5. Chest pain: Having increased atypical chest pain.  She has an abnormal ECG but this is chronic and unchanged.  - I will arrange for coronary CT angiogram with FFR to assess for significant CAD as cause of her symptoms.  6. Lightheaded/diaphoretic spells: No particular trigger, occurring frequently more recently.  One severe episode last week of presyncope.  Episodes seem almost vasovagal in nature but cannot identify a trigger as above.  She does not note palpitations but cannot rule out tachy or brady-arrhythmia.   - I will arrange for a 2 wk Zio patch to assess for arrhythmias.   Patient location is home, provider location is office.  Patient identity confirmed with 2  factors, she gives consent for virtual visit today  Today she is concerned about a ST for the last 4 days Feels like it might be due to sinus drainage She has noted some sneezing and nasal drainage  Her temp has been running about 97.3 orally  Mild cough which can be occasionally productive of some clear mucus.  She does not feel that bad, but she is concerned about possible Covid infection.  She wonders if she should be screened  No shortness of breath Patient Active Problem List   Diagnosis Date Noted  . Chest pain 04/04/2019  . Anxiety 04/04/2019  . Leukopenia 04/04/2019  . Pneumonia 04/04/2019  . Dyspnea on exertion   . Moderate aortic regurgitation 11/02/2017  . Lightheadedness 11/02/2017  . Tachycardia 07/26/2017  . Atypical chest pain 07/26/2017  . High risk medications (not anticoagulants) long-term use 01/16/2017  . Baker's cyst of knee, left 12/19/2016  . Pain in both knees 12/19/2016  . Pain of both elbows 12/19/2016  . Malignant melanoma ([place]) 12/19/2016  . Basal cell carcinoma 12/19/2016  . Chronic pansinusitis 08/15/2016  . Deviated septum 08/15/2016  . Epistaxis 08/15/2016  . Hypertrophy, nasal, turbinate 08/15/2016  . Upper airway cough syndrome 04/25/2016  . Hx of adenomatous colonic polyps 06/21/2015  . Hyperlipidemia 11/15/2014  . Rheumatoid arthritis ([place]) 09/01/2014  . Pericarditis 04/24/2011  . Aortic valve disorder 02/14/2009  .  ELECTROCARDIOGRAM, ABNORMAL 02/14/2009  . PERNICIOUS ANEMIA 07/10/2007  . DEPRESSION 07/03/2007  . MALAISE AND FATIGUE 07/03/2007  . Hypothyroidism 04/30/2007  . Essential hypertension 04/30/2007  . Aneurysm of thoracic aorta ([place]) 02/13/2006    Past Medical History:  Diagnosis Date  . Adenomatous colon polyp 2012  . Allergy   . Anxiety   . Aortic insufficiency   . Arthritis   . Basal cell carcinoma   . Cataract   . Depression   . Diverticulosis   . Fibromyalgia   . Fibromyalgia   . Hyperlipidemia   .  Hypertension   . Macular degeneration   . Melanoma ([place])   . Murmur, heart   . Reflux   . Stroke ([place]) 20 years ago   per pt. mild stroke  . Thyroid disease    hypothroidism     Past Surgical History:  Procedure Laterality Date  . ABDOMINAL HYSTERECTOMY    . APPENDECTOMY    . BASAL CELL CARCINOMA EXCISION     nose   . CARDIAC CATHETERIZATION    . CHOLECYSTECTOMY    . COLONOSCOPY     30 + years ago, unsure of where  . EYE SURGERY    . LASIK Bilateral   . MELANOMA EXCISION    . NASAL SEPTUM SURGERY    . TUBAL LIGATION      Social History   Tobacco Use  . Smoking status: Former Smoker    Packs/day: 2.00    Years: 20.00    Pack years: 40.00    Types: Cigarettes    Quit date: 10/30/1983    Years since quitting: 35.8  . Smokeless tobacco: Never Used  Substance Use Topics  . Alcohol use: No    Alcohol/week: 0.0 standard drinks  . Drug use: Never    Family History  Problem Relation Age of Onset  . Heart disease Mother   . Hypertension Mother   . Mental illness Mother   . Allergies Mother   . Heart disease Father   . Hypertension Father   . Mental illness Father   . Emphysema Father        smoked  . Allergies Father   . Heart attack Father   . Hyperlipidemia Sister   . Hypertension Sister   . Allergies Sister   . Hypertension Sister   . Diabetes Sister   . Allergies Sister   . Epilepsy Son   . Aneurysm Son   . Hyperthyroidism Daughter   . Hypothyroidism Daughter   . Hypertension Daughter   . Colon cancer Neg Hx   . Esophageal cancer Neg Hx   . Rectal cancer Neg Hx   . Stomach cancer Neg Hx   . Pancreatic cancer Neg Hx   . Prostate cancer Neg Hx     Allergies  Allergen Reactions  . Tramadol Other (See Comments)    dizzy  . Statins     Body ache    Medication list has been reviewed and updated.  Current Outpatient Medications on File Prior to Visit  Medication Sig Dispense Refill  . albuterol (PROVENTIL HFA;VENTOLIN HFA) 108 (90 Base)  MCG/ACT inhaler Inhale 2 puffs into the lungs every 6 (six) hours as needed for wheezing or shortness of breath. 18 g 2  . amLODipine (NORVASC) 10 MG tablet Take 1 tablet (10 mg total) by mouth daily. At bedtime 90 tablet 3  . aspirin EC 81 MG tablet Take 1 tablet (81 mg total) by mouth daily. 90 tablet 3  .  cholecalciferol (VITAMIN D3) 25 MCG (1000 UT) tablet Take 2,000 Units by mouth daily.    . clonazePAM (KLONOPIN) 0.5 MG tablet TAKE 1 TABLET BY MOUTH AT BEDTIME. MAY TAKE 2 TIMES DAILY AS NEEDED ON OCCASION 60 tablet 2  . Cyanocobalamin (VITAMIN B 12 PO) Take 1 tablet by mouth daily.    . cycloSPORINE (RESTASIS) 0.05 % ophthalmic emulsion Place 1 drop into both eyes 2 (two) times daily.     . famotidine (PEPCID) 20 MG tablet Take 1 tablet (20 mg total) by mouth daily. 30 tablet 5  . folic acid (FOLVITE) 1 MG tablet Take 2 mg by mouth daily.    . hydrochlorothiazide (HYDRODIURIL) 12.5 MG tablet Take 1 Tablet Daily 30 tablet 3  . ibuprofen (ADVIL,MOTRIN) 200 MG tablet Take 600 mg by mouth 2 (two) times daily as needed for mild pain.    Marland Kitchen irbesartan (AVAPRO) 300 MG tablet Take 1 tablet (300 mg total) by mouth daily. 30 tablet 11  . leflunomide (ARAVA) 10 MG tablet TAKE 1 TABLET BY MOUTH ONCE EVERY OTHER DAY ALTERNATING WITH 2 TABLETS 45 tablet 2  . methotrexate 50 MG/2ML injection ADMINISTER 0.5 ML UNDER THE SKIN 1 TIME A WEEK 6 mL 0  . metoprolol tartrate (LOPRESSOR) 50 MG tablet Take 1 tablet (50 mg total) by mouth once for 1 dose. Take 1 hour before CT scan 1 tablet 0  . Multiple Vitamins-Minerals (PRESERVISION AREDS) TABS Take 1 tablet by mouth 2 (two) times daily.    . rosuvastatin (CRESTOR) 10 MG tablet Take 1 tablet (10 mg total) by mouth daily.    . sertraline (ZOLOFT) 100 MG tablet TAKE 2 TABLETS(200 MG) BY MOUTH DAILY 180 tablet 1  . SYNTHROID 112 MCG tablet TAKE 1 TABLET(112 MCG) BY MOUTH DAILY (Patient taking differently: Take 112 mcg by mouth daily. ) 30 tablet 6  . TUBERCULIN SYR  1CC/27GX1/2" (SAFETY-LOK TB SYRINGE 27GX.5") 27G X 1/2" 1 ML MISC USE AS DIRECTED 12 each 3   No current facility-administered medications on file prior to visit.     Review of Systems:  As per HPI- otherwise negative. No fever, no nausea or vomiting  Physical Examination: There were no vitals filed for this visit. There were no vitals filed for this visit. There is no height or weight on file to calculate BMI. Ideal Body Weight:    Her sat is 96- 97% per home sat meter She is following her BP and pulse for Dr Aundra Dubin BP is ok, HR is 80s-90s  Spoke to pt on the phone -she sounds well, no cough, wheezing, shortness of breath is noted  Assessment and Plan: Cough - Plan: Novel Coronavirus, NAA ([facility])  Virtual visit today for concern of cough, mild sore throat and nasal symptoms.  Patient does not feel very ill, but is concerned about possible COVID-19.  I have ordered a COVID-19 test for her, and explained drive up testing procedure at the [facility].  She plans to be tested tomorrow.  She will self isolate until her results return-we will let me know if any other concerns  Spoke with patient on the telephone for 7 minutes  Signed Lamar Blinks, MD

## 2019-09-08 ENCOUNTER — Other Ambulatory Visit: Payer: Self-pay

## 2019-09-08 DIAGNOSIS — Z20822 Contact with and (suspected) exposure to covid-19: Secondary | ICD-10-CM

## 2019-09-08 DIAGNOSIS — Z20828 Contact with and (suspected) exposure to other viral communicable diseases: Secondary | ICD-10-CM | POA: Diagnosis not present

## 2019-09-11 ENCOUNTER — Telehealth (HOSPITAL_COMMUNITY): Payer: Medicare Other | Admitting: Cardiology

## 2019-09-11 LAB — NOVEL CORONAVIRUS, NAA: SARS-CoV-2, NAA: NOT DETECTED

## 2019-09-21 ENCOUNTER — Telehealth: Payer: Self-pay | Admitting: Rheumatology

## 2019-09-21 ENCOUNTER — Telehealth (INDEPENDENT_AMBULATORY_CARE_PROVIDER_SITE_OTHER): Payer: Medicare Other | Admitting: Rheumatology

## 2019-09-21 ENCOUNTER — Ambulatory Visit (HOSPITAL_COMMUNITY)
Admission: RE | Admit: 2019-09-21 | Discharge: 2019-09-21 | Disposition: A | Discharge status: Home / Self Care | Payer: Medicare Other | Source: Ambulatory Visit | Attending: Cardiology | Admitting: Cardiology

## 2019-09-21 ENCOUNTER — Encounter (HOSPITAL_COMMUNITY): Payer: Self-pay | Admitting: Cardiology

## 2019-09-21 ENCOUNTER — Other Ambulatory Visit: Payer: Self-pay

## 2019-09-21 VITALS — BP 149/80 | HR 86 | Wt 152.0 lb

## 2019-09-21 DIAGNOSIS — I359 Nonrheumatic aortic valve disorder, unspecified: Secondary | ICD-10-CM

## 2019-09-21 DIAGNOSIS — E785 Hyperlipidemia, unspecified: Secondary | ICD-10-CM | POA: Diagnosis not present

## 2019-09-21 DIAGNOSIS — I2510 Atherosclerotic heart disease of native coronary artery without angina pectoris: Secondary | ICD-10-CM | POA: Diagnosis not present

## 2019-09-21 DIAGNOSIS — I358 Other nonrheumatic aortic valve disorders: Secondary | ICD-10-CM | POA: Diagnosis present

## 2019-09-21 DIAGNOSIS — M25511 Pain in right shoulder: Secondary | ICD-10-CM

## 2019-09-21 DIAGNOSIS — Z79899 Other long term (current) drug therapy: Secondary | ICD-10-CM

## 2019-09-21 DIAGNOSIS — E7849 Other hyperlipidemia: Secondary | ICD-10-CM | POA: Diagnosis not present

## 2019-09-21 DIAGNOSIS — I10 Essential (primary) hypertension: Secondary | ICD-10-CM | POA: Insufficient documentation

## 2019-09-21 DIAGNOSIS — R0789 Other chest pain: Secondary | ICD-10-CM | POA: Diagnosis not present

## 2019-09-21 DIAGNOSIS — Z79890 Hormone replacement therapy (postmenopausal): Secondary | ICD-10-CM | POA: Insufficient documentation

## 2019-09-21 DIAGNOSIS — M069 Rheumatoid arthritis, unspecified: Secondary | ICD-10-CM | POA: Diagnosis not present

## 2019-09-21 DIAGNOSIS — F17210 Nicotine dependence, cigarettes, uncomplicated: Secondary | ICD-10-CM | POA: Insufficient documentation

## 2019-09-21 DIAGNOSIS — I351 Nonrheumatic aortic (valve) insufficiency: Secondary | ICD-10-CM | POA: Insufficient documentation

## 2019-09-21 DIAGNOSIS — M25561 Pain in right knee: Secondary | ICD-10-CM | POA: Diagnosis not present

## 2019-09-21 DIAGNOSIS — Z7982 Long term (current) use of aspirin: Secondary | ICD-10-CM | POA: Diagnosis not present

## 2019-09-21 DIAGNOSIS — R5381 Other malaise: Secondary | ICD-10-CM

## 2019-09-21 DIAGNOSIS — R42 Dizziness and giddiness: Secondary | ICD-10-CM | POA: Diagnosis not present

## 2019-09-21 DIAGNOSIS — R5383 Other fatigue: Secondary | ICD-10-CM

## 2019-09-21 LAB — LIPID PANEL
Cholesterol: 155 mg/dL (ref 0–200)
HDL: 41 mg/dL (ref >40)
LDL Cholesterol: 98 mg/dL (ref 0–99)
Total CHOL/HDL Ratio: 3.8 RATIO
Triglycerides: 82 mg/dL (ref <150)
VLDL: 16 mg/dL (ref 0–40)

## 2019-09-21 MED ORDER — PREDNISONE 5 MG PO TABS: ORAL_TABLET | ORAL | 0 refills | Status: DC

## 2019-09-21 NOTE — Progress Notes (Signed)
Virtual Visit via Video Note  I connected with Katherine Wang on 09/21/19 at 10:15 AM EST by a video enabled telemedicine application and verified that I am speaking with the correct person using two identifiers.  Location: Patient: At home Provider: At the office   I discussed the limitations of evaluation and management by telemedicine and the availability of in person appointments. The patient expressed understanding and agreed to proceed.  History of Present Illness: Katherine Wang is a 73 y.o. female with history of seropositive rheumatoid arthritis.  She has been on methotrexate 0.85mL subcu weekly and Arava 10 mg p.o. alternating with 20 mg p.o. daily.  According to patient she continues to have pain and discomfort in her right shoulder joint.  She states she has difficulty drying her hair.  She had a cortisone injection in September.  She is also having discomfort at nighttime.  Her right knee joint continues to be painful as well.  She states she has a palpable Baker's cyst.  She has been taking her medications on a regular basis.  None of the other joints are painful.  She has some discomfort in her right [facility] joint which is tolerable. Activities of Daily Living:  Patient reports morning stiffness for 5 minutes.   Patient Reports nocturnal pain.  In her right shoulder Difficulty dressing/grooming: Reports Difficulty climbing stairs: Reports Difficulty getting out of chair: Reports Difficulty using hands for taps, buttons, cutlery, and/or writing: Reports  Review of Systems  Constitutional: Negative for fatigue, night sweats, weight gain and weight loss.  HENT: Negative for mouth sores, trouble swallowing, trouble swallowing, mouth dryness and nose dryness.   Eyes: Positive for dryness. Negative for pain, redness and visual disturbance.  Respiratory: Negative for cough, shortness of breath and difficulty breathing.   Cardiovascular: Negative for chest pain, palpitations, hypertension,  irregular heartbeat and swelling in legs/feet.  Gastrointestinal: Positive for constipation. Negative for blood in stool and diarrhea.  Endocrine: Negative Genitourinary: Negative for difficulty urinating and vaginal dryness.  Musculoskeletal: Positive for arthralgias, gait problem, joint pain, joint swelling and morning stiffness. Negative for myalgias, muscle weakness, muscle tenderness and myalgias.  Skin: Negative for color change, rash, hair loss, skin tightness, ulcers and sensitivity to sunlight.  Allergic/Immunologic: Negative for susceptible to infections.  Neurological: Negative for dizziness, memory loss, night sweats and weakness.  Hematological: Negative for bruising/bleeding tendency and swollen glands.  Psychiatric/Behavioral: Negative for depressed mood and sleep disturbance. The patient is not nervous/anxious.    Observations/Objective:   Assessment and Plan: Visit Diagnoses: Rheumatoid arthritis involving multiple sites with positive rheumatoid factor (HCC)-she continues to have pain and discomfort in the right shoulder joint and right knee joint.  Her right shoulder and knee joint were injected in September.  Unfortunately due to neutropenia we cannot increase her dose of medications.  She is requesting a prednisone taper.  I will send a prednisone taper starting at 20 mg and taper by 5 mg every 4 days.  Side effects were reviewed.  Her dose of methotrexate was increased to 0.5 mL subcu weekly after the last visit.  High risk medications (not anticoagulants) long-term use -methotrexate 0.5 ml every 7 days, folic acid 1 mg 2 tablets daily, and Arava 10 mg 1 tablet daily alternating with 2 tablets daily. Most recent CBC/CMP within normal limits except for low WBC on August 03, 2019. I will repeat her labs in January.  Acute pain of right shoulder-she had subacromial bursitis for which she had injection in  September.  She continues to have pain.  I will call in prednisone  taper..  Acute pain of right knee-she states that the cortisone injection lasted for short time and the pain has recurred.  Pain in right hip -the pain has improved.  Primary osteoarthritis of both hands-joint protection was discussed.  Other medical problems are listed as follows:  History of melanoma-patient is unable to use aggressive Biologics.  Rituxan is the only option.  Postmenopausal-I will schedule DEXA scan.  Patient states that she does not recall when her last bone density was.  Other medical problems are listed as follows:  History of cholecystectomy  History of macular degeneration  History of hypertension  History of depression  History of anxiety  History of anemia  History of hypothyroidism  History of asthma  History of basal cell carcinoma  Follow Up Instructions:    I discussed the assessment and treatment plan with the patient. The patient was provided an opportunity to ask questions and all were answered. The patient agreed with the plan and demonstrated an understanding of the instructions.   The patient was advised to call back or seek an in-person evaluation if the symptoms worsen or if the condition fails to improve as anticipated.  I provided 67minutes of non-face-to-face time during this encounter.   Bo Merino, MD

## 2019-09-21 NOTE — Telephone Encounter (Signed)
Patient called stating checking on her prescription of Prednisone and to make sure it is sent to [facility] at [place].

## 2019-09-21 NOTE — Telephone Encounter (Signed)
I LMOM for patient to call, and schedule her next follow up appointment in 3 months (around 12/22/2019).

## 2019-09-21 NOTE — Addendum Note (Signed)
Addended by: Earnestine Mealing on: 09/21/2019 03:37 PM   Modules accepted: Orders

## 2019-09-21 NOTE — Patient Instructions (Signed)
Labs today We will only contact you if something comes back abnormal or we need to make some changes. Otherwise no news is good news!  Your physician recommends that you schedule a follow-up appointment in: 6 months with Dr Aundra Dubin. You will get a call to schedule this appointment.  Please call office at [phone] option 2 if you have any questions or concerns.   At the [facility], you and your health needs are our priority. As part of our continuing mission to provide you with exceptional heart care, we have created designated Provider Care Teams. These Care Teams include your primary Cardiologist (physician) and Advanced Practice Providers (APPs- Physician Assistants and Nurse Practitioners) who all work together to provide you with the care you need, when you need it.   You may see any of the following providers on your designated Care Team at your next follow up: Marland Kitchen Dr Glori Bickers . Dr Loralie Champagne . Darrick Grinder, NP . Lyda Jester, PA   Please be sure to bring in all your medications bottles to every appointment.

## 2019-09-21 NOTE — Telephone Encounter (Signed)
Attempted to contact patient and left message on machine to advise patient prednisone taper has been sent to the pharmacy.

## 2019-09-22 NOTE — Addendum Note (Signed)
Encounter addended by: Larey Dresser, MD on: 09/22/2019 11:48 PM  Actions taken: Clinical Note Signed, Level of Service modified, Visit diagnoses modified

## 2019-09-22 NOTE — Progress Notes (Signed)
History of Present Illness: Katherine Wang is a 73 y.o. female who has history of rheumatoid arthritis, aortic insufficiency, mild ascending aortic dilatation, and suspected prior acute pericarditis. Patient's cardiac history begins in 2004 when she had possible acute pericarditis in [place].  LHC at that time showed no significant disease.  In our office, she has been followed for a malformed (though trileaflet) aortic valve.  She has had aortic insufficiency and a mildly dilated ascending aorta.  She moved to [place] in 2012 to help her daughter who has an autistic child.  She is back in [place] now.  She says she had a stress test at some time in 60 in [place].  She thinks it was normal.  Echo in 7/17 showed EF 65-70% with moderate AI.  CTA chest in 6/17 showed 3.9 cm ascending aorta.   She had what sounds like a pericarditis flare in 10/17 and was treated with colchicine and ibuprofen with improvement.  Cardiolite was ordered but she cancelled it.   Echo in 1/19 showed EF 60-65% with mild-moderate AI, mild AS.  CTA chest 2/19 showed 3.9 cm ascending aorta.    Recently, she has had a number of episodes of nausea/diaphoresis and lightheadedness.  These episodes do not seem to have a particular trigger.  She will develop warmth, diaphoresis, and lightheadedness, at times to the point that she had to lie down to stop from passing out.  No palpitations noted.  She also has been having episodes of atypical central chest pain. The pain usually occurs when she is more active, but is fleeting (only lasting for 10 seconds or so).    Coronary CTA was done in 10/20 showed possible moderate mLAD stenosis.  FFR was 0.87, suggesting not hemodynamically significant.  Echo showed normal EF, mild AI.  2 week Zio patch showed short runs of SVT (not atrial fibrillation).  I had her increase Coreg to 6.25 mg bid.  She says that this has decreased both chest pain episodes and lightheaded episodes.  She  has more fatigue on higher dose Coreg.  She was not orthostatic when checked in in the office today.   Labs (6/12): BNP 86, ESR 83 Labs (11/15): K 3.8, creatinine 0.94, TSH normal Labs (6/17): Na 131, K 3.7, creatinine 0.8, LDL 77, HDL 52 Labs (1/19): K 3.9, creatinine 0.91 Labs (12/19): K 3.9, creatinine 0.7, hgb 12.2, LDL 79, HDL 42 Labs (10/20): K 3.3, creatinine 0.9, Hs-TnI normal  PMH: 1. Acute pericarditis in 2004: Hospitalized in [place].  Had LHC at that time that she reports as showing no significant CAD.  Possible recurrent pericarditis 6/12 and 10/17.  2. 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 Patient has a trileaflet but malformed aortic valve.  Last echo (4/10) showed EF 55-60%, mild LV dilation, mild LV hypertrophy, mild aortic insufficiency, mild MR.  Cardiac MRI (4/10) confirmed that the aortic valve was trileaflet but malformed, EF 64%.  - Echo (6/12): EF 50%, moderate (grade II) diastolic dysfunction, mild AI, mild MR, no pericardial effusion.   - Echo (2/16): EF 50-55%, moderate AI, ascending aorta 4.0 cm, mild MR.  - Echo (7/17): EF 65-70%, moderate AI - Echo (1/19): EF 60-65%, mild LVH, mild-moderate AI with mild AS, 4.1 cm ascending aorta, mild MR.  - Echo (10/20): EF 60-65%, mild AI, mild MR.  3.  Ascending aorta dilation: MRA chest (4/10) with 4 x 3.6 cm ascending aorta.  CTA chest (7/11) with 3.7 x 3.7 cm ascending  aorta and aberrant right subclavian course.  - CTA chest 6/17 with 3.9 cm ascending aorta.  - CTA chest 2/19 with 3.9 cm ascending aorta.  4.  HTN 5.  Rheumatoid arthritis  6.  H/o rheumatic fever in childhood.  7. Hyperlipidemia 8. Aberrant course right subclavian artery 9. 1/19 carotid doppler: mild bilateral stenosis.  10. Chest pain: Coronary CTA in 10/20 showed possible moderate mid LAD stenosis, FFR 0.87 suggested not hemodynamically significant.  11. Palpitations: Zio patch x 2 wks in 11/20 showed 5 short SVT runs, longest 10 beats, no  atrial fibrillation.   Current Outpatient Medications  Medication Sig Dispense Refill   albuterol (PROVENTIL HFA;VENTOLIN HFA) 108 (90 Base) MCG/ACT inhaler Inhale 2 puffs into the lungs every 6 (six) hours as needed for wheezing or shortness of breath. 18 g 2   amLODipine (NORVASC) 10 MG tablet Take 1 tablet (10 mg total) by mouth daily. At bedtime 90 tablet 3   aspirin EC 81 MG tablet Take 1 tablet (81 mg total) by mouth daily. 90 tablet 3   carvedilol (COREG) 6.25 MG tablet Take 1 tablet (6.25 mg total) by mouth 2 (two) times daily with a meal. 180 tablet 3   cholecalciferol (VITAMIN D3) 25 MCG (1000 UT) tablet Take 2,000 Units by mouth daily.     clonazePAM (KLONOPIN) 0.5 MG tablet TAKE 1 TABLET BY MOUTH AT BEDTIME. MAY TAKE 2 TIMES DAILY AS NEEDED ON OCCASION 60 tablet 2   Cyanocobalamin (VITAMIN B 12 PO) Take 1 tablet by mouth daily.     cycloSPORINE (RESTASIS) 0.05 % ophthalmic emulsion Place 1 drop into both eyes 2 (two) times daily.      famotidine (PEPCID) 20 MG tablet Take 1 tablet (20 mg total) by mouth daily. 30 tablet 5   folic acid (FOLVITE) 1 MG tablet Take 2 mg by mouth daily.     hydrochlorothiazide (HYDRODIURIL) 12.5 MG tablet Take 1 Tablet Daily 30 tablet 3   ibuprofen (ADVIL,MOTRIN) 200 MG tablet Take 600 mg by mouth 2 (two) times daily as needed for mild pain.     irbesartan (AVAPRO) 300 MG tablet Take 1 tablet (300 mg total) by mouth daily. 30 tablet 11   leflunomide (ARAVA) 10 MG tablet TAKE 1 TABLET BY MOUTH ONCE EVERY OTHER DAY ALTERNATING WITH 2 TABLETS 45 tablet 2   methotrexate 50 MG/2ML injection ADMINISTER 0.5 ML UNDER THE SKIN 1 TIME A WEEK 6 mL 0   metoprolol tartrate (LOPRESSOR) 50 MG tablet Take 1 tablet (50 mg total) by mouth once for 1 dose. Take 1 hour before CT scan 1 tablet 0   Multiple Vitamins-Minerals (PRESERVISION AREDS) TABS Take 1 tablet by mouth 2 (two) times daily.     rosuvastatin (CRESTOR) 10 MG tablet Take 1 tablet (10 mg  total) by mouth daily.     sertraline (ZOLOFT) 100 MG tablet TAKE 2 TABLETS(200 MG) BY MOUTH DAILY 180 tablet 1   SYNTHROID 112 MCG tablet TAKE 1 TABLET(112 MCG) BY MOUTH DAILY (Patient taking differently: Take 112 mcg by mouth daily. ) 30 tablet 6   TUBERCULIN SYR 1CC/27GX1/2" (SAFETY-LOK TB SYRINGE 27GX.5") 27G X 1/2" 1 ML MISC USE AS DIRECTED 12 each 3   predniSONE (DELTASONE) 5 MG tablet Take 4 tabs po qd x 4 days, 3  tabs po qd x 4 days, 2  tabs po qd x 4 days, 1  tab po qd x 4 days 40 tablet 0   No current facility-administered medications for this  encounter.     Allergies:   Tramadol and Statins   Social History:  The patient  reports that she quit smoking about 35 years ago. Her smoking use included cigarettes. She has a 40.00 pack-year smoking history. She has never used smokeless tobacco. She reports that she does not drink alcohol or use drugs.   Family History:  The patient's family history includes Allergies in her father, mother, sister, and sister; Aneurysm in her son; Diabetes in her sister; Emphysema in her father; Epilepsy in her son; Heart attack in her father; Heart disease in her father and mother; Hyperlipidemia in her sister; Hypertension in her daughter, father, mother, sister, and sister; Hyperthyroidism in her daughter; Hypothyroidism in her daughter; Mental illness in her father and mother.   ROS:  Please see the history of present illness.   All other systems are personally reviewed and negative.   Exam:   BP (!) 149/80 (Patient Position: Standing)    Pulse 86    Wt 68.9 kg (152 lb)    SpO2 98%    BMI 29.93 kg/m  General: NAD Neck: No JVD, no thyromegaly or thyroid nodule.  Lungs: Clear to auscultation bilaterally with normal respiratory effort. CV: Nondisplaced PMI.  Heart regular S1/S2, no S3/S4, 1/6 SEM RUSB.  No peripheral edema.  No carotid bruit.  Normal pedal pulses.  Abdomen: Soft, nontender, no hepatosplenomegaly, no distention.  Skin: Intact without  lesions or rashes.  Neurologic: Alert and oriented x 3.  Psych: Normal affect. Extremities: No clubbing or cyanosis.  HEENT: Normal.   Recent Labs: 07/08/2019: ALT 18 08/03/2019: BUN 17; Creatinine, Ser 0.90; Hemoglobin 12.2; Platelets 195; Potassium 3.3; Sodium 133  Personally reviewed   Wt Readings from Last 3 Encounters:  09/21/19 68.9 kg (152 lb)  08/07/19 68 kg (150 lb)  08/03/19 67.1 kg (148 lb)      ASSESSMENT AND PLAN:  1. Aortic valve disorder: History of malformed aortic valve with mild to moderate AI and mild AS on 1/19 echo. Suspect rheumatic heart disease.  Echo in 10/20 showed mild AI.  2. Ascending aorta dilation: Mild on CTA chest in 2/19.  Can repeat in 2 years in 2/21.   3. HTN: BP is reasonably controlled.   4. Hyperlipidemia: Check lipids today.  With significant CAD on coronary CTA, would aim for LDL < 70.    5. CAD: Coronary CTA in 11/20 showed possible moderate mLAD stenosis, but FFR was only 0.87, suggesting not hemodynamically significant.  This is unlikely to be the cause of chest pain episodes.  - Continue Crestor 10 mg daily, check lipids today with goal LDL < 70.  - Continue ASA 81 daily.   6. Lightheaded/diaphoretic spells: No particular trigger, occurring frequently more recently.  2 week Zio patch showed short (longest 10 beats) runs of SVT (5 episodes), not atrial fibrillation.  Of note, patient has had less lightheadedness on increased Coreg 6.25 mg bid.   Followup in 6 months.   Signed, Loralie Champagne, MD  09/22/2019   [facility] [address] [facility] [place] [address] [phone] (office) [phone] (fax)

## 2019-09-28 ENCOUNTER — Other Ambulatory Visit (HOSPITAL_COMMUNITY): Payer: Self-pay

## 2019-09-28 DIAGNOSIS — E7849 Other hyperlipidemia: Secondary | ICD-10-CM

## 2019-09-28 DIAGNOSIS — Z79899 Other long term (current) drug therapy: Secondary | ICD-10-CM

## 2019-09-28 DIAGNOSIS — E782 Mixed hyperlipidemia: Secondary | ICD-10-CM

## 2019-09-28 NOTE — Progress Notes (Signed)
Orders Placed This Encounter  Procedures  . Lipid Profile    Standing Status:   Future    Standing Expiration Date:   09/27/2020  . Hepatic function panel    Standing Status:   Future    Standing Expiration Date:   09/27/2020

## 2019-09-30 DIAGNOSIS — Z85820 Personal history of malignant melanoma of skin: Secondary | ICD-10-CM | POA: Diagnosis not present

## 2019-09-30 DIAGNOSIS — Z85828 Personal history of other malignant neoplasm of skin: Secondary | ICD-10-CM | POA: Diagnosis not present

## 2019-09-30 DIAGNOSIS — L821 Other seborrheic keratosis: Secondary | ICD-10-CM | POA: Diagnosis not present

## 2019-10-09 ENCOUNTER — Ambulatory Visit: Payer: Medicare Other | Admitting: Physician Assistant

## 2019-10-11 DIAGNOSIS — Z20828 Contact with and (suspected) exposure to other viral communicable diseases: Secondary | ICD-10-CM | POA: Diagnosis not present

## 2019-10-11 DIAGNOSIS — Z03818 Encounter for observation for suspected exposure to other biological agents ruled out: Secondary | ICD-10-CM | POA: Diagnosis not present

## 2019-10-12 ENCOUNTER — Telehealth (HOSPITAL_COMMUNITY): Payer: Self-pay | Admitting: *Deleted

## 2019-10-12 MED ORDER — CARVEDILOL 3.125 MG PO TABS: 3.1250 mg | ORAL_TABLET | Freq: Two times a day (BID) | ORAL | 3 refills | Status: DC

## 2019-10-12 NOTE — Telephone Encounter (Signed)
Pt called c/o weakness since increasing carvedilol to 6.25mg  bid.  Pt said her bp is 100/75 and she feels out of energy. Per Dr.McLean decrease carvedilol to 3.125mg  twice daily.

## 2019-10-14 ENCOUNTER — Ambulatory Visit: Payer: Medicare Other | Admitting: Physician Assistant

## 2019-10-26 ENCOUNTER — Telehealth: Payer: Self-pay | Admitting: Rheumatology

## 2019-10-26 DIAGNOSIS — M0579 Rheumatoid arthritis with rheumatoid factor of multiple sites without organ or systems involvement: Secondary | ICD-10-CM

## 2019-10-26 MED ORDER — METHOTREXATE SODIUM CHEMO INJECTION 50 MG/2ML: INTRAMUSCULAR | 0 refills | Status: DC

## 2019-10-26 NOTE — Telephone Encounter (Signed)
Patient called requesting prescription refill of Methotrexate to be sent to [facility] at [place].  Patient states the pharmacy told her they faxed the refill request and haven't received a response.  Patient states she is out of medication and missed taking her injection last Saturday, 10/24/19.

## 2019-10-26 NOTE — Telephone Encounter (Signed)
Last Visit: 09/21/2019 Next Visit: 12/29/2019 Labs: 08/03/2019 Most recent CBC/CMPwithin normal limits except for low WBC  Okay to refill per Dr. Estanislado Pandy.    Advised patient refill has been sent to the pharmacy and advised patient she could inject MTX today and change the schedule to every Monday. Patient verbalized understanding.

## 2019-10-27 ENCOUNTER — Other Ambulatory Visit: Payer: Self-pay | Admitting: Family Medicine

## 2019-10-27 DIAGNOSIS — E034 Atrophy of thyroid (acquired): Secondary | ICD-10-CM

## 2019-10-27 MED ORDER — LEVOTHYROXINE SODIUM 112 MCG PO TABS: 112.0000 ug | ORAL_TABLET | Freq: Every day | ORAL | 1 refills | Status: DC

## 2019-10-27 MED ORDER — AMLODIPINE BESYLATE 10 MG PO TABS: 10.0000 mg | ORAL_TABLET | Freq: Every day | ORAL | 3 refills | Status: AC

## 2019-10-27 NOTE — Telephone Encounter (Signed)
Pt is requesting a refill for SYNTHROID 112 MCG tablet    Pharmacy: [facility], [place]

## 2019-10-27 NOTE — Telephone Encounter (Signed)
Medication refilled

## 2019-10-27 NOTE — Addendum Note (Signed)
Addended by: Wynonia Musty A on: 10/27/2019 04:26 PM   Modules accepted: Orders

## 2019-11-04 ENCOUNTER — Telehealth: Payer: Self-pay

## 2019-11-04 DIAGNOSIS — M0579 Rheumatoid arthritis with rheumatoid factor of multiple sites without organ or systems involvement: Secondary | ICD-10-CM

## 2019-11-04 MED ORDER — LEFLUNOMIDE 10 MG PO TABS: ORAL_TABLET | ORAL | 0 refills | Status: DC

## 2019-11-04 NOTE — Telephone Encounter (Signed)
Refill request received via fax from [facility] on [address] for leflunomide.   Last Visit: 09/21/2019 telemedicine  Next Visit: 12/29/2019 Labs: 08/03/2019 Most recent CBC/CMPwithin normal limits except for low WBC  Advised patient she is due to update labs, patient verbalized understanding and [facility]. Orders have been released.   Okay to refill 30 day supply, per Dr. Estanislado Pandy.

## 2019-11-10 DIAGNOSIS — M0579 Rheumatoid arthritis with rheumatoid factor of multiple sites without organ or systems involvement: Secondary | ICD-10-CM | POA: Diagnosis not present

## 2019-11-10 NOTE — Progress Notes (Signed)
[place] at [facility] [address], [place], [place] [address] [phone]  Date:  11/11/2019   Name:  Katherine Wang   DOB:  [birth year]   MRN:  [id number]  PCP:  [name], MD    Chief Complaint: No chief complaint on file.   History of Present Illness:  Katherine Wang is a 74 y.o. very pleasant female patient who presents with the following:  Patient with history of of melanoma, basal cell carcinoma, hypertension, pericarditis, RA Virtual visit today to discuss illness Patient location is home, provider location is office Patient identity confirmed with 2 factors, she gives consent for virtual visit today  We had a virtual visit together in November, at that time she was screened for COVID-19 and was negative She was seen in CHF clinic on November 23  She was tested for covid about 3 weeks ago and was negative She had a "stomach bug" a week ago or so- diarrhea, cramping- now resolved Temp has been "a little bit elevated" to 98.8 She is checking her sats- running at least 96% or higher  Today pt notes ongoing chest congestion that tends to be worse in the am when she first arises She has noted this congestion and cough for a few weeks- maybe a month She will cough, and a couple of times she has coughed up a discolored mucus  Flu vaccine: done in September  She has questions about the covid 19 vaccine today as well  Methotrexate injections weekly for her RA-   Patient Active Problem List   Diagnosis Date Noted  . Chest pain 04/04/2019  . Anxiety 04/04/2019  . Leukopenia 04/04/2019  . Pneumonia 04/04/2019  . Dyspnea on exertion   . Moderate aortic regurgitation 11/02/2017  . Lightheadedness 11/02/2017  . Tachycardia 07/26/2017  . Atypical chest pain 07/26/2017  . High risk medications (not anticoagulants) long-term use 01/16/2017  . Baker's cyst of knee, left 12/19/2016  . Pain in both knees 12/19/2016  .  Pain of both elbows 12/19/2016  . Malignant melanoma ([place]) 12/19/2016  . Basal cell carcinoma 12/19/2016  . Chronic pansinusitis 08/15/2016  . Deviated septum 08/15/2016  . Epistaxis 08/15/2016  . Hypertrophy, nasal, turbinate 08/15/2016  . Upper airway cough syndrome 04/25/2016  . Hx of adenomatous colonic polyps 06/21/2015  . Hyperlipidemia 11/15/2014  . Rheumatoid arthritis ([place]) 09/01/2014  . Pericarditis 04/24/2011  . Aortic valve disorder 02/14/2009  . ELECTROCARDIOGRAM, ABNORMAL 02/14/2009  . PERNICIOUS ANEMIA 07/10/2007  . DEPRESSION 07/03/2007  . MALAISE AND FATIGUE 07/03/2007  . Hypothyroidism 04/30/2007  . Essential hypertension 04/30/2007  . Aneurysm of thoracic aorta ([place]) 02/13/2006    Past Medical History:  Diagnosis Date  . Adenomatous colon polyp 2012  . Allergy   . Anxiety   . Aortic insufficiency   . Arthritis   . Basal cell carcinoma   . Cataract   . Depression   . Diverticulosis   . Fibromyalgia   . Fibromyalgia   . Hyperlipidemia   . Hypertension   . Macular degeneration   . Melanoma ([place])   . Murmur, heart   . Reflux   . Stroke ([place]) 20 years ago   per pt. mild stroke  . Thyroid disease    hypothroidism     Past Surgical History:  Procedure Laterality Date  . ABDOMINAL HYSTERECTOMY    . APPENDECTOMY    . BASAL CELL CARCINOMA EXCISION  nose   . CARDIAC CATHETERIZATION    . CHOLECYSTECTOMY    . COLONOSCOPY     30 + years ago, unsure of where  . EYE SURGERY    . LASIK Bilateral   . MELANOMA EXCISION    . NASAL SEPTUM SURGERY    . TUBAL LIGATION      Social History   Tobacco Use  . Smoking status: Former Smoker    Packs/day: 2.00    Years: 20.00    Pack years: 40.00    Types: Cigarettes    Quit date: 10/30/1983    Years since quitting: 36.0  . Smokeless tobacco: Never Used  Substance Use Topics  . Alcohol use: No    Alcohol/week: 0.0 standard drinks  . Drug use: Never    Family History  Problem Relation Age of Onset   . Heart disease Mother   . Hypertension Mother   . Mental illness Mother   . Allergies Mother   . Heart disease Father   . Hypertension Father   . Mental illness Father   . Emphysema Father        smoked  . Allergies Father   . Heart attack Father   . Hyperlipidemia Sister   . Hypertension Sister   . Allergies Sister   . Hypertension Sister   . Diabetes Sister   . Allergies Sister   . Epilepsy Son   . Aneurysm Son   . Hyperthyroidism Daughter   . Hypothyroidism Daughter   . Hypertension Daughter   . Colon cancer Neg Hx   . Esophageal cancer Neg Hx   . Rectal cancer Neg Hx   . Stomach cancer Neg Hx   . Pancreatic cancer Neg Hx   . Prostate cancer Neg Hx     Allergies  Allergen Reactions  . Tramadol Other (See Comments)    dizzy  . Statins     Body ache    Medication list has been reviewed and updated.  Current Outpatient Medications on File Prior to Visit  Medication Sig Dispense Refill  . albuterol (PROVENTIL HFA;VENTOLIN HFA) 108 (90 Base) MCG/ACT inhaler Inhale 2 puffs into the lungs every 6 (six) hours as needed for wheezing or shortness of breath. 18 g 2  . amLODipine (NORVASC) 10 MG tablet Take 1 tablet (10 mg total) by mouth daily. At bedtime 90 tablet 3  . aspirin EC 81 MG tablet Take 1 tablet (81 mg total) by mouth daily. 90 tablet 3  . carvedilol (COREG) 3.125 MG tablet Take 1 tablet (3.125 mg total) by mouth 2 (two) times daily with a meal. 60 tablet 3  . cholecalciferol (VITAMIN D3) 25 MCG (1000 UT) tablet Take 2,000 Units by mouth daily.    . clonazePAM (KLONOPIN) 0.5 MG tablet TAKE 1 TABLET BY MOUTH AT BEDTIME. MAY TAKE 2 TIMES DAILY AS NEEDED ON OCCASION 60 tablet 2  . Cyanocobalamin (VITAMIN B 12 PO) Take 1 tablet by mouth daily.    . cycloSPORINE (RESTASIS) 0.05 % ophthalmic emulsion Place 1 drop into both eyes 2 (two) times daily.     . famotidine (PEPCID) 20 MG tablet Take 1 tablet (20 mg total) by mouth daily. 30 tablet 5  . folic acid (FOLVITE)  1 MG tablet Take 2 mg by mouth daily.    . hydrochlorothiazide (HYDRODIURIL) 12.5 MG tablet Take 1 Tablet Daily 30 tablet 3  . ibuprofen (ADVIL,MOTRIN) 200 MG tablet Take 600 mg by mouth 2 (two) times daily as needed for  mild pain.    Marland Kitchen irbesartan (AVAPRO) 300 MG tablet Take 1 tablet (300 mg total) by mouth daily. 30 tablet 11  . leflunomide (ARAVA) 10 MG tablet TAKE 1 TABLET BY MOUTH ONCE EVERY OTHER DAY ALTERNATING WITH 2 TABLETS 45 tablet 0  . levothyroxine (SYNTHROID) 112 MCG tablet Take 1 tablet (112 mcg total) by mouth daily. 90 tablet 1  . methotrexate 50 MG/2ML injection ADMINISTER 0.5 ML UNDER THE SKIN 1 TIME A WEEK 6 mL 0  . metoprolol tartrate (LOPRESSOR) 50 MG tablet Take 1 tablet (50 mg total) by mouth once for 1 dose. Take 1 hour before CT scan 1 tablet 0  . Multiple Vitamins-Minerals (PRESERVISION AREDS) TABS Take 1 tablet by mouth 2 (two) times daily.    . predniSONE (DELTASONE) 5 MG tablet Take 4 tabs po qd x 4 days, 3  tabs po qd x 4 days, 2  tabs po qd x 4 days, 1  tab po qd x 4 days 40 tablet 0  . rosuvastatin (CRESTOR) 10 MG tablet Take 1 tablet (10 mg total) by mouth daily.    . sertraline (ZOLOFT) 100 MG tablet TAKE 2 TABLETS(200 MG) BY MOUTH DAILY 180 tablet 1  . TUBERCULIN SYR 1CC/27GX1/2" (SAFETY-LOK TB SYRINGE 27GX.5") 27G X 1/2" 1 ML MISC USE AS DIRECTED 12 each 3   No current facility-administered medications on file prior to visit.    Review of Systems:  As per HPI- otherwise negative.   Physical Examination: There were no vitals filed for this visit. There were no vitals filed for this visit. There is no height or weight on file to calculate BMI. Ideal Body Weight:    Spoke to pt on the phone- she sounds well She will check her BP at home  Sounds well, no distress of SOB noted Occasional clearing throat, but not coughing  Assessment and Plan: Chronic cough - Plan: doxycycline (VIBRAMYCIN) 100 MG capsule  Low grade fever  Immunization  counseling  Spoke with pt for 12 minutes today  Tested negative for covid about 3 weeks ago but continues to have suspicious sx- recommended that she repeat testing Treat for cough with doxycycline now Discussed covid vaccine assuming test is negative Moderate medical decision making today  Pt sent the following mychart message: To get a covid shot appt- go to this website:  http://www.har[facility].info/  Please be tested for covid 19 at your earliest convenience- let me know if you are positive If you wish to be tested through [facility] please go to the following website: [email or web address].[email or web address]  Otherwise we will treat you for possible bronchitis with a course of doxycycline I would encourage you to have the covid 19 vaccine as soon as you are able Signed Lamar Blinks, MD

## 2019-11-11 ENCOUNTER — Ambulatory Visit (INDEPENDENT_AMBULATORY_CARE_PROVIDER_SITE_OTHER): Payer: Medicare Other | Admitting: Family Medicine

## 2019-11-11 ENCOUNTER — Other Ambulatory Visit: Payer: Self-pay

## 2019-11-11 ENCOUNTER — Encounter: Payer: Self-pay | Admitting: Family Medicine

## 2019-11-11 DIAGNOSIS — Z7189 Other specified counseling: Secondary | ICD-10-CM | POA: Diagnosis not present

## 2019-11-11 DIAGNOSIS — R509 Fever, unspecified: Secondary | ICD-10-CM

## 2019-11-11 DIAGNOSIS — R05 Cough: Secondary | ICD-10-CM

## 2019-11-11 DIAGNOSIS — R053 Chronic cough: Secondary | ICD-10-CM

## 2019-11-11 DIAGNOSIS — Z7185 Encounter for immunization safety counseling: Secondary | ICD-10-CM

## 2019-11-11 LAB — CBC WITH DIFFERENTIAL/PLATELET
Absolute Monocytes: 251 cells/uL (ref 200–950)
Basophils Absolute: 19 cells/uL (ref 0–200)
Basophils Relative: 0.7 %
Eosinophils Absolute: 30 cells/uL (ref 15–500)
Eosinophils Relative: 1.1 %
HCT: 35.3 % (ref 35.0–45.0)
Hemoglobin: 11.9 g/dL (ref 11.7–15.5)
Lymphs Abs: 1075 cells/uL (ref 850–3900)
MCH: 34 pg — ABNORMAL HIGH (ref 27.0–33.0)
MCHC: 33.7 g/dL (ref 32.0–36.0)
MCV: 100.9 fL — ABNORMAL HIGH (ref 80.0–100.0)
MPV: 10.1 fL (ref 7.5–12.5)
Monocytes Relative: 9.3 %
Neutro Abs: 1326 cells/uL — ABNORMAL LOW (ref 1500–7800)
Neutrophils Relative %: 49.1 %
Platelets: 230 10*3/uL (ref 140–400)
RBC: 3.5 10*6/uL — ABNORMAL LOW (ref 3.80–5.10)
RDW: 13.8 % (ref 11.0–15.0)
Total Lymphocyte: 39.8 %
WBC: 2.7 10*3/uL — ABNORMAL LOW (ref 3.8–10.8)

## 2019-11-11 LAB — COMPLETE METABOLIC PANEL WITH GFR
AG Ratio: 1.9 (calc) (ref 1.0–2.5)
ALT: 13 U/L (ref 6–29)
AST: 16 U/L (ref 10–35)
Albumin: 4 g/dL (ref 3.6–5.1)
Alkaline phosphatase (APISO): 73 U/L (ref 37–153)
BUN: 14 mg/dL (ref 7–25)
CO2: 29 mmol/L (ref 20–32)
Calcium: 9.1 mg/dL (ref 8.6–10.4)
Chloride: 102 mmol/L (ref 98–110)
Creat: 0.83 mg/dL (ref 0.60–0.93)
GFR, Est African American: 80 mL/min/{1.73_m2} (ref >=60)
GFR, Est Non African American: 69 mL/min/{1.73_m2} (ref >=60)
Globulin: 2.1 g/dL (calc) (ref 1.9–3.7)
Glucose, Bld: 111 mg/dL — ABNORMAL HIGH (ref 65–99)
Potassium: 4 mmol/L (ref 3.5–5.3)
Sodium: 139 mmol/L (ref 135–146)
Total Bilirubin: 0.6 mg/dL (ref 0.2–1.2)
Total Protein: 6.1 g/dL (ref 6.1–8.1)

## 2019-11-11 MED ORDER — DOXYCYCLINE HYCLATE 100 MG PO CAPS: 100.0000 mg | ORAL_CAPSULE | Freq: Two times a day (BID) | ORAL | 0 refills | Status: DC

## 2019-11-12 ENCOUNTER — Other Ambulatory Visit: Payer: Self-pay

## 2019-11-12 ENCOUNTER — Telehealth: Payer: Self-pay | Admitting: Rheumatology

## 2019-11-12 ENCOUNTER — Ambulatory Visit: Payer: Self-pay | Admitting: Rheumatology

## 2019-11-12 MED ORDER — PREDNISONE 5 MG PO TABS: ORAL_TABLET | ORAL | 0 refills | Status: DC

## 2019-11-12 MED ORDER — LEFLUNOMIDE 10 MG PO TABS: 10.0000 mg | ORAL_TABLET | Freq: Every day | ORAL | 0 refills | Status: DC

## 2019-11-12 NOTE — Telephone Encounter (Signed)
Advised patient to call and schedule a follow-up appointment in 2 to 3weeks to discuss other treatment options. Patient verbalized understanding. Prednisone taper has been sent to the pharmacy, patient is aware.

## 2019-11-12 NOTE — Telephone Encounter (Signed)
Okay to give prednisone taper starting at 20 mg and taper by 5 mg every 4 days.  He is to schedule a follow-up appointment in 2 to 3weeks to discuss other treatment options.

## 2019-11-12 NOTE — Telephone Encounter (Signed)
Patient was worked in the schedule this morning for an appointment this afternoon and was screened with COVID questions/symptoms. Patient denied any symptoms. Patient then called back stating her PCP is sending for a COVID test due to diarrhea, fatigue and cramps. Appointment has been canceled and she is requesting a prednisone taper for the right shoulder pain she is experiencing. Patient has been taking MTX and arava on schedule. Today, we changed arava dose to 10mg  daily based on lab results. Please advise.

## 2019-11-12 NOTE — Telephone Encounter (Signed)
Patient called requesting prescription of Prednisone for her right shoulder pain to be sent to [facility] at [address] in [place].    Patient states she has been experiencing fatigue, cramps, and diarrhea for the past week and her PCP is sending her for a COVID test.

## 2019-11-16 ENCOUNTER — Ambulatory Visit: Payer: Medicare Other | Attending: Internal Medicine

## 2019-11-16 DIAGNOSIS — Z20822 Contact with and (suspected) exposure to covid-19: Secondary | ICD-10-CM | POA: Diagnosis not present

## 2019-11-17 LAB — NOVEL CORONAVIRUS, NAA: SARS-CoV-2, NAA: NOT DETECTED

## 2019-11-23 ENCOUNTER — Telehealth: Payer: Self-pay | Admitting: Rheumatology

## 2019-11-23 ENCOUNTER — Telehealth: Payer: Self-pay | Admitting: Family Medicine

## 2019-11-23 ENCOUNTER — Encounter: Payer: Self-pay | Admitting: Family Medicine

## 2019-11-23 DIAGNOSIS — F329 Major depressive disorder, single episode, unspecified: Secondary | ICD-10-CM

## 2019-11-23 DIAGNOSIS — F32A Depression, unspecified: Secondary | ICD-10-CM

## 2019-11-23 DIAGNOSIS — R05 Cough: Secondary | ICD-10-CM

## 2019-11-23 DIAGNOSIS — R058 Other specified cough: Secondary | ICD-10-CM

## 2019-11-23 MED ORDER — SERTRALINE HCL 100 MG PO TABS: ORAL_TABLET | ORAL | 1 refills | Status: DC

## 2019-11-23 MED ORDER — FAMOTIDINE 20 MG PO TABS: 20.0000 mg | ORAL_TABLET | Freq: Every day | ORAL | 5 refills | Status: DC

## 2019-11-23 NOTE — Telephone Encounter (Signed)
Medication: sertraline (ZOLOFT) 100 MG tablet & famotidine (PEPCID) 20 MG tablet  Has the patient contacted their pharmacy? Yes - pharmacy notified pt they have requested several times w/o response - pt down to 1 day of sertraline  Preferred Pharmacy (with phone number or street name):  [facility] [place], [place] Phone:  [phone]  Fax:  [phone]

## 2019-11-23 NOTE — Telephone Encounter (Signed)
Patient left a voicemail to let Dr. Estanislado Pandy know that she had a COVID test on 11/16/19 which was negative.  Patient states she is taking her Prednisone prescription.  Patient states she would like to get a cortisone injection in her shoulder, but isn't sure if she needs to be off the Prednisone before having the injection.

## 2019-11-23 NOTE — Telephone Encounter (Signed)
Spoke with patient and advised that she will not need to be off the prednisone to get a cortisone injection. Patient advised we will have to schedule her a few weeks out due to her recent Covid test. Patient verbalized understanding and appointment was scheduled for 12/23/19 at 10:20 am.

## 2019-11-23 NOTE — Telephone Encounter (Signed)
Medications have been refilled and sent to pharmacy 

## 2019-11-24 ENCOUNTER — Other Ambulatory Visit: Payer: Self-pay

## 2019-11-24 DIAGNOSIS — F329 Major depressive disorder, single episode, unspecified: Secondary | ICD-10-CM

## 2019-11-24 DIAGNOSIS — F32A Depression, unspecified: Secondary | ICD-10-CM

## 2019-11-30 ENCOUNTER — Ambulatory Visit (HOSPITAL_COMMUNITY)
Admission: RE | Admit: 2019-11-30 | Discharge: 2019-11-30 | Disposition: A | Discharge status: Home / Self Care | Payer: Medicare Other | Source: Ambulatory Visit | Attending: Internal Medicine | Admitting: Internal Medicine

## 2019-11-30 ENCOUNTER — Other Ambulatory Visit: Payer: Self-pay

## 2019-11-30 DIAGNOSIS — Z79899 Other long term (current) drug therapy: Secondary | ICD-10-CM | POA: Insufficient documentation

## 2019-11-30 DIAGNOSIS — E782 Mixed hyperlipidemia: Secondary | ICD-10-CM | POA: Diagnosis not present

## 2019-11-30 LAB — HEPATIC FUNCTION PANEL
ALT: 20 U/L (ref 0–44)
AST: 19 U/L (ref 15–41)
Albumin: 3.5 g/dL (ref 3.5–5.0)
Alkaline Phosphatase: 58 U/L (ref 38–126)
Bilirubin, Direct: 0.2 mg/dL (ref 0.0–0.2)
Indirect Bilirubin: 0.5 mg/dL (ref 0.3–0.9)
Total Bilirubin: 0.7 mg/dL (ref 0.3–1.2)
Total Protein: 5.8 g/dL — ABNORMAL LOW (ref 6.5–8.1)

## 2019-11-30 LAB — LIPID PANEL
Cholesterol: 164 mg/dL (ref 0–200)
HDL: 52 mg/dL (ref >40)
LDL Cholesterol: 97 mg/dL (ref 0–99)
Total CHOL/HDL Ratio: 3.2 RATIO
Triglycerides: 76 mg/dL (ref <150)
VLDL: 15 mg/dL (ref 0–40)

## 2019-12-01 ENCOUNTER — Telehealth (HOSPITAL_COMMUNITY): Payer: Self-pay

## 2019-12-01 DIAGNOSIS — E782 Mixed hyperlipidemia: Secondary | ICD-10-CM

## 2019-12-01 NOTE — Telephone Encounter (Signed)
Pt aware of results and advised of recommendations. Pt reports that she could not tolerate the 20mg  daily and have been taking 10mg  daily due to muscle aches.  She also have been doing some dietary changes. Per Dr Aundra Dubin, Pt to be referred to lipid clinic for repatha. Referral placed. Patient aware of same.

## 2019-12-01 NOTE — Telephone Encounter (Signed)
-----   Message from Larey Dresser, MD sent at 11/30/2019 10:50 PM EST ----- LDL is still higher than goal (<70).  Increase Crestor to 40 mg daily with lipids/LFTs in 2 months.

## 2019-12-07 ENCOUNTER — Other Ambulatory Visit: Payer: Medicare Other

## 2019-12-08 ENCOUNTER — Other Ambulatory Visit (HOSPITAL_COMMUNITY): Payer: Self-pay

## 2019-12-08 MED ORDER — ROSUVASTATIN CALCIUM 10 MG PO TABS: 10.0000 mg | ORAL_TABLET | Freq: Every day | ORAL | 5 refills | Status: AC

## 2019-12-10 ENCOUNTER — Other Ambulatory Visit: Payer: Self-pay

## 2019-12-10 ENCOUNTER — Encounter: Payer: Self-pay | Admitting: Family Medicine

## 2019-12-10 ENCOUNTER — Ambulatory Visit: Payer: Medicare Other | Attending: Internal Medicine

## 2019-12-10 ENCOUNTER — Ambulatory Visit (INDEPENDENT_AMBULATORY_CARE_PROVIDER_SITE_OTHER): Payer: Medicare Other | Admitting: Family Medicine

## 2019-12-10 DIAGNOSIS — J029 Acute pharyngitis, unspecified: Secondary | ICD-10-CM

## 2019-12-10 DIAGNOSIS — M25511 Pain in right shoulder: Secondary | ICD-10-CM | POA: Diagnosis not present

## 2019-12-10 DIAGNOSIS — Z20822 Contact with and (suspected) exposure to covid-19: Secondary | ICD-10-CM | POA: Insufficient documentation

## 2019-12-10 DIAGNOSIS — G8929 Other chronic pain: Secondary | ICD-10-CM | POA: Diagnosis not present

## 2019-12-10 DIAGNOSIS — M059 Rheumatoid arthritis with rheumatoid factor, unspecified: Secondary | ICD-10-CM | POA: Diagnosis not present

## 2019-12-10 MED ORDER — PENICILLIN V POTASSIUM 500 MG PO TABS: 500.0000 mg | ORAL_TABLET | Freq: Two times a day (BID) | ORAL | 0 refills | Status: DC

## 2019-12-10 NOTE — Progress Notes (Signed)
[place] at [facility] [address] [place], [place] [address] [phone]  Date:  12/10/2019   Name:  Katherine Wang   DOB:  [birth year]   MRN:  [id number]  PCP:  [name], MD    Chief Complaint: No chief complaint on file.   History of Present Illness:  Katherine Wang is a 74 y.o. very pleasant female patient who presents with the following:  Pt with history of RA, hypothyroidism, hyperlipidemia, HTN Virtual visit today to discuss   Called pt and connected with her on the phone, the pt and myself are present on the call today Pt ID confirmed with 2 factors- she gives consent for virtual visit today  She notes a ST for about 2 weeks mostly in the am, she thinks due to dryness over night.  However over the past week she has had a more persistent ST and wonders if she might have an infection She has tried salt water gargles, started zyrtec yesterday  She wonders if she is ok to get her covid shot- she hopes to get a call soon about an appt  She had a temp of 98.6 recently- no other temp noted  No cough   Her other issues is that she needs a steroid injection in her right shoulder.  However she reports that Dr Arlean Hopping office has a policy that patients cannot be seen for 30 days following any covid test, even if negative.  She is not sure what to do as her shoulder is very painful  She is taking methotrexate and arava for her RA      Patient Active Problem List   Diagnosis Date Noted  . Chest pain 04/04/2019  . Anxiety 04/04/2019  . Leukopenia 04/04/2019  . Pneumonia 04/04/2019  . Dyspnea on exertion   . Moderate aortic regurgitation 11/02/2017  . Lightheadedness 11/02/2017  . Tachycardia 07/26/2017  . Atypical chest pain 07/26/2017  . High risk medications (not anticoagulants) long-term use 01/16/2017  . Baker's cyst of knee, left 12/19/2016  . Pain in both knees 12/19/2016  . Pain of both elbows  12/19/2016  . Malignant melanoma ([place]) 12/19/2016  . Basal cell carcinoma 12/19/2016  . Chronic pansinusitis 08/15/2016  . Deviated septum 08/15/2016  . Epistaxis 08/15/2016  . Hypertrophy, nasal, turbinate 08/15/2016  . Upper airway cough syndrome 04/25/2016  . Hx of adenomatous colonic polyps 06/21/2015  . Hyperlipidemia 11/15/2014  . Rheumatoid arthritis ([place]) 09/01/2014  . Pericarditis 04/24/2011  . Aortic valve disorder 02/14/2009  . ELECTROCARDIOGRAM, ABNORMAL 02/14/2009  . PERNICIOUS ANEMIA 07/10/2007  . DEPRESSION 07/03/2007  . MALAISE AND FATIGUE 07/03/2007  . Hypothyroidism 04/30/2007  . Essential hypertension 04/30/2007  . Aneurysm of thoracic aorta ([place]) 02/13/2006    Past Medical History:  Diagnosis Date  . Adenomatous colon polyp 2012  . Allergy   . Anxiety   . Aortic insufficiency   . Arthritis   . Basal cell carcinoma   . Cataract   . Depression   . Diverticulosis   . Fibromyalgia   . Fibromyalgia   . Hyperlipidemia   . Hypertension   . Macular degeneration   . Melanoma ([place])   . Murmur, heart   . Reflux   . Stroke ([place]) 20 years ago   per pt. mild stroke  . Thyroid disease    hypothroidism     Past Surgical History:  Procedure Laterality Date  . ABDOMINAL HYSTERECTOMY    .  APPENDECTOMY    . BASAL CELL CARCINOMA EXCISION     nose   . CARDIAC CATHETERIZATION    . CHOLECYSTECTOMY    . COLONOSCOPY     30 + years ago, unsure of where  . EYE SURGERY    . LASIK Bilateral   . MELANOMA EXCISION    . NASAL SEPTUM SURGERY    . TUBAL LIGATION      Social History   Tobacco Use  . Smoking status: Former Smoker    Packs/day: 2.00    Years: 20.00    Pack years: 40.00    Types: Cigarettes    Quit date: 10/30/1983    Years since quitting: 36.1  . Smokeless tobacco: Never Used  Substance Use Topics  . Alcohol use: No    Alcohol/week: 0.0 standard drinks  . Drug use: Never    Family History  Problem Relation Age of Onset  . Heart disease  Mother   . Hypertension Mother   . Mental illness Mother   . Allergies Mother   . Heart disease Father   . Hypertension Father   . Mental illness Father   . Emphysema Father        smoked  . Allergies Father   . Heart attack Father   . Hyperlipidemia Sister   . Hypertension Sister   . Allergies Sister   . Hypertension Sister   . Diabetes Sister   . Allergies Sister   . Epilepsy Son   . Aneurysm Son   . Hyperthyroidism Daughter   . Hypothyroidism Daughter   . Hypertension Daughter   . Colon cancer Neg Hx   . Esophageal cancer Neg Hx   . Rectal cancer Neg Hx   . Stomach cancer Neg Hx   . Pancreatic cancer Neg Hx   . Prostate cancer Neg Hx     Allergies  Allergen Reactions  . Tramadol Other (See Comments)    dizzy  . Statins     Body ache    Medication list has been reviewed and updated.  Current Outpatient Medications on File Prior to Visit  Medication Sig Dispense Refill  . albuterol (PROVENTIL HFA;VENTOLIN HFA) 108 (90 Base) MCG/ACT inhaler Inhale 2 puffs into the lungs every 6 (six) hours as needed for wheezing or shortness of breath. 18 g 2  . amLODipine (NORVASC) 10 MG tablet Take 1 tablet (10 mg total) by mouth daily. At bedtime 90 tablet 3  . aspirin EC 81 MG tablet Take 1 tablet (81 mg total) by mouth daily. 90 tablet 3  . carvedilol (COREG) 3.125 MG tablet Take 1 tablet (3.125 mg total) by mouth 2 (two) times daily with a meal. 60 tablet 3  . cholecalciferol (VITAMIN D3) 25 MCG (1000 UT) tablet Take 2,000 Units by mouth daily.    . clonazePAM (KLONOPIN) 0.5 MG tablet TAKE 1 TABLET BY MOUTH AT BEDTIME. MAY TAKE 2 TIMES DAILY AS NEEDED ON OCCASION 60 tablet 2  . Cyanocobalamin (VITAMIN B 12 PO) Take 1 tablet by mouth daily.    . cycloSPORINE (RESTASIS) 0.05 % ophthalmic emulsion Place 1 drop into both eyes 2 (two) times daily.     . famotidine (PEPCID) 20 MG tablet Take 1 tablet (20 mg total) by mouth daily. 30 tablet 5  . folic acid (FOLVITE) 1 MG tablet Take  2 mg by mouth daily.    . hydrochlorothiazide (HYDRODIURIL) 12.5 MG tablet Take 1 Tablet Daily 30 tablet 3  . ibuprofen (ADVIL,MOTRIN) 200 MG  tablet Take 600 mg by mouth 2 (two) times daily as needed for mild pain.    Marland Kitchen irbesartan (AVAPRO) 300 MG tablet Take 1 tablet (300 mg total) by mouth daily. 30 tablet 11  . leflunomide (ARAVA) 10 MG tablet Take 1 tablet (10 mg total) by mouth daily. 30 tablet 0  . levothyroxine (SYNTHROID) 112 MCG tablet Take 1 tablet (112 mcg total) by mouth daily. 90 tablet 1  . methotrexate 50 MG/2ML injection ADMINISTER 0.5 ML UNDER THE SKIN 1 TIME A WEEK 6 mL 0  . metoprolol tartrate (LOPRESSOR) 50 MG tablet Take 1 tablet (50 mg total) by mouth once for 1 dose. Take 1 hour before CT scan 1 tablet 0  . Multiple Vitamins-Minerals (PRESERVISION AREDS) TABS Take 1 tablet by mouth 2 (two) times daily.    . predniSONE (DELTASONE) 5 MG tablet Take 4 tabs po qd x 4 days, 3  tabs po qd x 4 days, 2  tabs po qd x 4 days, 1  tab po qd x 4 days 40 tablet 0  . rosuvastatin (CRESTOR) 10 MG tablet Take 1 tablet (10 mg total) by mouth daily. 30 tablet 5  . sertraline (ZOLOFT) 100 MG tablet TAKE 2 TABLETS(200 MG) BY MOUTH DAILY 180 tablet 1  . TUBERCULIN SYR 1CC/27GX1/2" (SAFETY-LOK TB SYRINGE 27GX.5") 27G X 1/2" 1 ML MISC USE AS DIRECTED 12 each 3   No current facility-administered medications on file prior to visit.    Review of Systems:  As per HPI- otherwise negative.   Physical Examination: There were no vitals filed for this visit. There were no vitals filed for this visit. There is no height or weight on file to calculate BMI. Ideal Body Weight:    Spoke to pt on the phone She sounds well No cough or SOB noted   Assessment and Plan: Chronic right shoulder pain - Plan: Ambulatory referral to Orthopedic Surgery  Sore throat - Plan: penicillin v potassium (VEETID) 500 MG tablet  Rheumatoid arthritis with positive rheumatoid factor, involving unspecified site [facility]) -  Plan: Ambulatory referral to Orthopedic Surgery  Pt with history of RA, needing a steroid injection for shoulder pain. She reports that Dr Buckner [place] office will not see her for 30 days if she has a covid test - even if negative.  This seems like an unusual policy, and I have asked her to follow-up and make sure this is still true or if there may have been some confusion.    For current sx of ST, encouraged her to be tested for COVID-19.  Assuming negative, we can treat her with penicillin for possible strep throat.  Also assuming negative, she should be okay to see an orthopedist for her shoulder treatment.  I am not aware of any other offices having the same policy regarding Covid testing as her rheumatology office  Discussed potential interaction between methotrexate and penicillin-Per patient, she has been instructed to continue her methotrexate with antibiotics unless she is seriously ill.  She did have a recent CBC and metabolic profile, renal function normal.  Spoke with patient for about 10 minutes today Moderate medical decision making   Signed Lamar Blinks, MD

## 2019-12-12 LAB — NOVEL CORONAVIRUS, NAA: SARS-CoV-2, NAA: NOT DETECTED

## 2019-12-14 DIAGNOSIS — M25511 Pain in right shoulder: Secondary | ICD-10-CM | POA: Diagnosis not present

## 2019-12-16 ENCOUNTER — Encounter: Payer: Self-pay | Admitting: Family Medicine

## 2019-12-16 DIAGNOSIS — F419 Anxiety disorder, unspecified: Secondary | ICD-10-CM

## 2019-12-17 MED ORDER — CLONAZEPAM 0.5 MG PO TABS: ORAL_TABLET | ORAL | 2 refills | Status: DC

## 2019-12-21 ENCOUNTER — Telehealth: Payer: Self-pay | Admitting: Rheumatology

## 2019-12-21 DIAGNOSIS — M0579 Rheumatoid arthritis with rheumatoid factor of multiple sites without organ or systems involvement: Secondary | ICD-10-CM

## 2019-12-21 DIAGNOSIS — H353132 Nonexudative age-related macular degeneration, bilateral, intermediate dry stage: Secondary | ICD-10-CM | POA: Diagnosis not present

## 2019-12-21 MED ORDER — METHOTREXATE SODIUM CHEMO INJECTION 50 MG/2ML: INTRAMUSCULAR | 0 refills | Status: DC

## 2019-12-21 NOTE — Telephone Encounter (Signed)
Patient request refill on MTX sent to [facility] on [address].

## 2019-12-21 NOTE — Telephone Encounter (Signed)
Last Visit: 09/21/2019 telemedicine  Next Visit: 12/29/2019 Labs: 11/10/19 Glucose is 111. Rest of CMP WNL  WBC count is low-2.7. Rviewed with Dr. Estanislado Pandy. Please notify patient and advise patient to reduce Arava to 10 mg daily   Okay to refill MTX per Dr. Estanislado Pandy

## 2019-12-22 ENCOUNTER — Telehealth: Payer: Self-pay | Admitting: *Deleted

## 2019-12-22 ENCOUNTER — Encounter: Payer: Self-pay | Admitting: Family Medicine

## 2019-12-22 NOTE — Telephone Encounter (Addendum)
Patient states she is having a flare with swelling, pain and warmth in her right knee and left wrist. Patient states she is having pain in her right shoulder. Patient states she saw an Orthopedic on 12/14/19 and was given a cortisone injection in her right shoulder. Patient states she has not had any relief from it. Patient states she is having trouble walking. Patient is due to get her Covid vaccine on Friday. Patient would like to know how much taking her MTX today or giving it after the Covid vaccine would affect the effectiveness. Please advise.

## 2019-12-22 NOTE — Telephone Encounter (Signed)
The recommendations are to hold off methotrexate for 1 week after the Covid vaccine for the full effectiveness of the vaccine.  The use of prednisone will also suppress the effect of vaccine.  If she is having pain and swelling then she may want to take prednisone and also continue with the methotrexate.

## 2019-12-22 NOTE — Telephone Encounter (Signed)
Patient advised recommendations are to hold off methotrexate for 1 week after the Covid vaccine for the full effectiveness of the vaccine. The use of prednisone will also suppress the effect of vaccine.  If she is having pain and swelling then she may want to take prednisone and also continue with the methotrexate. Patient states she will hold on both medications until after vaccination.

## 2019-12-22 NOTE — Telephone Encounter (Signed)
Patient she is due to for her MTX today. Patient states she is due to have her first Covid vaccine on 12/25/19. Patient advised she does not have to hold her medication. Patient verbalized understanding.

## 2019-12-23 ENCOUNTER — Ambulatory Visit: Payer: Medicare Other | Admitting: Physician Assistant

## 2019-12-23 NOTE — Progress Notes (Deleted)
Office Visit Note  Patient: Katherine Wang             Date of Birth: 21-Feb-1946           MRN: [id number]             PCP: [name], MD Referring: [name], MD Visit Date: 12/29/2019 Occupation: @GUAROCC @  Subjective:  No chief complaint on file.   History of Present Illness: Katherine Wang is a 74 y.o. female ***   Activities of Daily Living:  Patient reports morning stiffness for *** {minute/hour:19697}.   Patient {ACTIONS;DENIES/REPORTS:21021675::"Denies"} nocturnal pain.  Difficulty dressing/grooming: {ACTIONS;DENIES/REPORTS:21021675::"Denies"} Difficulty climbing stairs: {ACTIONS;DENIES/REPORTS:21021675::"Denies"} Difficulty getting out of chair: {ACTIONS;DENIES/REPORTS:21021675::"Denies"} Difficulty using hands for taps, buttons, cutlery, and/or writing: {ACTIONS;DENIES/REPORTS:21021675::"Denies"}  No Rheumatology ROS completed.   PMFS History:  Patient Active Problem List   Diagnosis Date Noted  . Chest pain 04/04/2019  . Anxiety 04/04/2019  . Leukopenia 04/04/2019  . Pneumonia 04/04/2019  . Dyspnea on exertion   . Moderate aortic regurgitation 11/02/2017  . Lightheadedness 11/02/2017  . Tachycardia 07/26/2017  . Atypical chest pain 07/26/2017  . High risk medications (not anticoagulants) long-term use 01/16/2017  . Baker's cyst of knee, left 12/19/2016  . Pain in both knees 12/19/2016  . Pain of both elbows 12/19/2016  . Malignant melanoma ([place]) 12/19/2016  . Basal cell carcinoma 12/19/2016  . Chronic pansinusitis 08/15/2016  . Deviated septum 08/15/2016  . Epistaxis 08/15/2016  . Hypertrophy, nasal, turbinate 08/15/2016  . Upper airway cough syndrome 04/25/2016  . Hx of adenomatous colonic polyps 06/21/2015  . Hyperlipidemia 11/15/2014  . Rheumatoid arthritis ([place]) 09/01/2014  . Pericarditis 04/24/2011  . Aortic valve disorder 02/14/2009  . ELECTROCARDIOGRAM, ABNORMAL 02/14/2009  . PERNICIOUS ANEMIA 07/10/2007  . DEPRESSION  07/03/2007  . MALAISE AND FATIGUE 07/03/2007  . Hypothyroidism 04/30/2007  . Essential hypertension 04/30/2007  . Aneurysm of thoracic aorta ([place]) 02/13/2006    Past Medical History:  Diagnosis Date  . Adenomatous colon polyp 2012  . Allergy   . Anxiety   . Aortic insufficiency   . Arthritis   . Basal cell carcinoma   . Cataract   . Depression   . Diverticulosis   . Fibromyalgia   . Fibromyalgia   . Hyperlipidemia   . Hypertension   . Macular degeneration   . Melanoma ([place])   . Murmur, heart   . Reflux   . Stroke ([place]) 20 years ago   per pt. mild stroke  . Thyroid disease    hypothroidism     Family History  Problem Relation Age of Onset  . Heart disease Mother   . Hypertension Mother   . Mental illness Mother   . Allergies Mother   . Heart disease Father   . Hypertension Father   . Mental illness Father   . Emphysema Father        smoked  . Allergies Father   . Heart attack Father   . Hyperlipidemia Sister   . Hypertension Sister   . Allergies Sister   . Hypertension Sister   . Diabetes Sister   . Allergies Sister   . Epilepsy Son   . Aneurysm Son   . Hyperthyroidism Daughter   . Hypothyroidism Daughter   . Hypertension Daughter   . Colon cancer Neg Hx   . Esophageal cancer Neg Hx   . Rectal cancer Neg Hx   . Stomach cancer Neg Hx   . Pancreatic cancer Neg Hx   .  Prostate cancer Neg Hx    Past Surgical History:  Procedure Laterality Date  . ABDOMINAL HYSTERECTOMY    . APPENDECTOMY    . BASAL CELL CARCINOMA EXCISION     nose   . CARDIAC CATHETERIZATION    . CHOLECYSTECTOMY    . COLONOSCOPY     30 + years ago, unsure of where  . EYE SURGERY    . LASIK Bilateral   . MELANOMA EXCISION    . NASAL SEPTUM SURGERY    . TUBAL LIGATION     Social History   Social History Narrative   Lives alone in a one story home.  Has 2 living children.  Retired Engineer, water.     Immunization History  Administered Date(s) Administered  . Influenza, High  Dose Seasonal PF 08/16/2016, 12/12/2017, 09/22/2018  . Influenza,inj,Quad PF,6+ Mos 06/30/2019  . Pneumococcal Conjugate-13 09/01/2014  . Pneumococcal Polysaccharide-23 12/12/2017  . Td 11/29/2006     Objective: Vital Signs: There were no vitals taken for this visit.   Physical Exam   Musculoskeletal Exam: ***  CDAI Exam: CDAI Score: -- Patient Global: --; Provider Global: -- Swollen: --; Tender: -- Joint Exam 12/29/2019   No joint exam has been documented for this visit   There is currently no information documented on the homunculus. Go to the Rheumatology activity and complete the homunculus joint exam.  Investigation: No additional findings.  Imaging: No results found.  Recent Labs: Lab Results  Component Value Date   WBC 2.7 (L) 11/10/2019   HGB 11.9 11/10/2019   PLT 230 11/10/2019   NA 139 11/10/2019   K 4.0 11/10/2019   CL 102 11/10/2019   CO2 29 11/10/2019   GLUCOSE 111 (H) 11/10/2019   BUN 14 11/10/2019   CREATININE 0.83 11/10/2019   BILITOT 0.7 11/30/2019   ALKPHOS 58 11/30/2019   AST 19 11/30/2019   ALT 20 11/30/2019   PROT 5.8 (L) 11/30/2019   ALBUMIN 3.5 11/30/2019   CALCIUM 9.1 11/10/2019   GFRAA 80 11/10/2019    Speciality Comments: No specialty comments available.  Procedures:  No procedures performed Allergies: Tramadol and Statins   Assessment / Plan:     Visit Diagnoses: No diagnosis found.  Orders: No orders of the defined types were placed in this encounter.  No orders of the defined types were placed in this encounter.   Face-to-face time spent with patient was *** minutes. Greater than 50% of time was spent in counseling and coordination of care.  Follow-Up Instructions: No follow-ups on file.   Earnestine Mealing, CMA  Note - This record has been created using Editor, commissioning.  Chart creation errors have been sought, but may not always  have been located. Such creation errors do not reflect on  the standard of medical  care.

## 2019-12-24 ENCOUNTER — Ambulatory Visit: Payer: Medicare Other | Admitting: Physician Assistant

## 2019-12-24 ENCOUNTER — Telehealth: Payer: Self-pay | Admitting: Rheumatology

## 2019-12-24 NOTE — Telephone Encounter (Signed)
Patient scheduled for an appointment for a cortisone injection on 12/24/19 at 1:00 pm.

## 2019-12-24 NOTE — Telephone Encounter (Signed)
Patient called stating she is having a lot of pain in her right knee and difficulty walking.  Patient states she is scheduled for her first COVID vaccine tomorrow, 12/25/19 and is asking if there is any way she could have a cortisone injection today.  Patient requested a return call.

## 2019-12-24 NOTE — Progress Notes (Deleted)
Office Visit Note  Patient: Katherine Wang             Date of Birth: [birth year]           MRN: [id number]             PCP: [name], MD Referring: [name], MD Visit Date: 12/24/2019 Occupation: @GUAROCC @  Subjective:  No chief complaint on file.   History of Present Illness: Katherine Wang is a 74 y.o. female ***   Activities of Daily Living:  Patient reports morning stiffness for *** {minute/hour:19697}.   Patient {ACTIONS;DENIES/REPORTS:21021675::"Denies"} nocturnal pain.  Difficulty dressing/grooming: {ACTIONS;DENIES/REPORTS:21021675::"Denies"} Difficulty climbing stairs: {ACTIONS;DENIES/REPORTS:21021675::"Denies"} Difficulty getting out of chair: {ACTIONS;DENIES/REPORTS:21021675::"Denies"} Difficulty using hands for taps, buttons, cutlery, and/or writing: {ACTIONS;DENIES/REPORTS:21021675::"Denies"}  No Rheumatology ROS completed.   PMFS History:  Patient Active Problem List   Diagnosis Date Noted  . Chest pain 04/04/2019  . Anxiety 04/04/2019  . Leukopenia 04/04/2019  . Pneumonia 04/04/2019  . Dyspnea on exertion   . Moderate aortic regurgitation 11/02/2017  . Lightheadedness 11/02/2017  . Tachycardia 07/26/2017  . Atypical chest pain 07/26/2017  . High risk medications (not anticoagulants) long-term use 01/16/2017  . Baker's cyst of knee, left 12/19/2016  . Pain in both knees 12/19/2016  . Pain of both elbows 12/19/2016  . Malignant melanoma ([place]) 12/19/2016  . Basal cell carcinoma 12/19/2016  . Chronic pansinusitis 08/15/2016  . Deviated septum 08/15/2016  . Epistaxis 08/15/2016  . Hypertrophy, nasal, turbinate 08/15/2016  . Upper airway cough syndrome 04/25/2016  . Hx of adenomatous colonic polyps 06/21/2015  . Hyperlipidemia 11/15/2014  . Rheumatoid arthritis ([place]) 09/01/2014  . Pericarditis 04/24/2011  . Aortic valve disorder 02/14/2009  . ELECTROCARDIOGRAM, ABNORMAL 02/14/2009  . PERNICIOUS ANEMIA 07/10/2007  . DEPRESSION  07/03/2007  . MALAISE AND FATIGUE 07/03/2007  . Hypothyroidism 04/30/2007  . Essential hypertension 04/30/2007  . Aneurysm of thoracic aorta ([place]) 02/13/2006    Past Medical History:  Diagnosis Date  . Adenomatous colon polyp 2012  . Allergy   . Anxiety   . Aortic insufficiency   . Arthritis   . Basal cell carcinoma   . Cataract   . Depression   . Diverticulosis   . Fibromyalgia   . Fibromyalgia   . Hyperlipidemia   . Hypertension   . Macular degeneration   . Melanoma ([place])   . Murmur, heart   . Reflux   . Stroke ([place]) 20 years ago   per pt. mild stroke  . Thyroid disease    hypothroidism     Family History  Problem Relation Age of Onset  . Heart disease Mother   . Hypertension Mother   . Mental illness Mother   . Allergies Mother   . Heart disease Father   . Hypertension Father   . Mental illness Father   . Emphysema Father        smoked  . Allergies Father   . Heart attack Father   . Hyperlipidemia Sister   . Hypertension Sister   . Allergies Sister   . Hypertension Sister   . Diabetes Sister   . Allergies Sister   . Epilepsy Son   . Aneurysm Son   . Hyperthyroidism Daughter   . Hypothyroidism Daughter   . Hypertension Daughter   . Colon cancer Neg Hx   . Esophageal cancer Neg Hx   . Rectal cancer Neg Hx   . Stomach cancer Neg Hx   . Pancreatic cancer Neg Hx   .  Prostate cancer Neg Hx    Past Surgical History:  Procedure Laterality Date  . ABDOMINAL HYSTERECTOMY    . APPENDECTOMY    . BASAL CELL CARCINOMA EXCISION     nose   . CARDIAC CATHETERIZATION    . CHOLECYSTECTOMY    . COLONOSCOPY     30 + years ago, unsure of where  . EYE SURGERY    . LASIK Bilateral   . MELANOMA EXCISION    . NASAL SEPTUM SURGERY    . TUBAL LIGATION     Social History   Social History Narrative   Lives alone in a one story home.  Has 2 living children.  Retired Engineer, water.     Immunization History  Administered Date(s) Administered  . Influenza, High  Dose Seasonal PF 08/16/2016, 12/12/2017, 09/22/2018  . Influenza,inj,Quad PF,6+ Mos 06/30/2019  . Pneumococcal Conjugate-13 09/01/2014  . Pneumococcal Polysaccharide-23 12/12/2017  . Td 11/29/2006     Objective: Vital Signs: There were no vitals taken for this visit.   Physical Exam   Musculoskeletal Exam: ***  CDAI Exam: CDAI Score: -- Patient Global: --; Provider Global: -- Swollen: --; Tender: -- Joint Exam 12/24/2019   No joint exam has been documented for this visit   There is currently no information documented on the homunculus. Go to the Rheumatology activity and complete the homunculus joint exam.  Investigation: No additional findings.  Imaging: No results found.  Recent Labs: Lab Results  Component Value Date   WBC 2.7 (L) 11/10/2019   HGB 11.9 11/10/2019   PLT 230 11/10/2019   NA 139 11/10/2019   K 4.0 11/10/2019   CL 102 11/10/2019   CO2 29 11/10/2019   GLUCOSE 111 (H) 11/10/2019   BUN 14 11/10/2019   CREATININE 0.83 11/10/2019   BILITOT 0.7 11/30/2019   ALKPHOS 58 11/30/2019   AST 19 11/30/2019   ALT 20 11/30/2019   PROT 5.8 (L) 11/30/2019   ALBUMIN 3.5 11/30/2019   CALCIUM 9.1 11/10/2019   GFRAA 80 11/10/2019    Speciality Comments: No specialty comments available.  Procedures:  No procedures performed Allergies: Tramadol and Statins   Assessment / Plan:     Visit Diagnoses: No diagnosis found.  Orders: No orders of the defined types were placed in this encounter.  No orders of the defined types were placed in this encounter.   Face-to-face time spent with patient was *** minutes. Greater than 50% of time was spent in counseling and coordination of care.  Follow-Up Instructions: No follow-ups on file.   Earnestine Mealing, CMA  Note - This record has been created using Editor, commissioning.  Chart creation errors have been sought, but may not always  have been located. Such creation errors do not reflect on  the standard of medical  care.

## 2019-12-25 ENCOUNTER — Ambulatory Visit: Payer: Medicare Other | Attending: Internal Medicine

## 2019-12-25 DIAGNOSIS — Z23 Encounter for immunization: Secondary | ICD-10-CM

## 2019-12-25 NOTE — Progress Notes (Signed)
   Covid-19 Vaccination [facility]  Name:  Katherine Wang    MRN: [id number] DOB: May 16, 1946  12/25/2019  Ms. Katherine Wang was observed post Covid-19 immunization for 15 minutes without incidence. She was provided with Vaccine Information Sheet and instruction to access the V-Safe system.   Ms. Katherine Wang was instructed to call 911 with any severe reactions post vaccine: Marland Kitchen Difficulty breathing  . Swelling of your face and throat  . A fast heartbeat  . A bad rash all over your body  . Dizziness and weakness    Immunizations Administered    Name Date Dose VIS Date Route   Pfizer COVID-19 Vaccine 12/25/2019  9:37 AM 0.3 mL 10/09/2019 Intramuscular   Manufacturer: [employer]   Lot: [id number]   [place]: [id number]

## 2019-12-29 ENCOUNTER — Ambulatory Visit: Payer: Medicare Other | Admitting: Rheumatology

## 2019-12-31 ENCOUNTER — Ambulatory Visit: Payer: Medicare Other

## 2019-12-31 ENCOUNTER — Encounter: Payer: Self-pay | Admitting: Family Medicine

## 2019-12-31 ENCOUNTER — Telehealth: Payer: Self-pay | Admitting: Rheumatology

## 2019-12-31 NOTE — Telephone Encounter (Signed)
Patient called stating her right shoulder, right knee, bilateral wrists and ankles are all swollen and painful.  Patient states she is having difficulty walking.  Patient states she had a cortisone injection in her right shoulder on 12/14/19 by her orthopedic surgeon which didn't give her any pain relief.  Patient states she has appointment scheduled for 01/07/20 and is asking if there is anything Dr. Estanislado Pandy could call in to the pharmacy that she can take until her appointment.  Patient states she cannot come in earlier because she had a COVID test on 12/10/19 which was negative.  Please advise.

## 2019-12-31 NOTE — Telephone Encounter (Signed)
Patient scheduled for 01/01/2020.

## 2019-12-31 NOTE — Telephone Encounter (Signed)
Her Covid test is negative.  Please schedule an office appointment for evaluation.

## 2020-01-01 ENCOUNTER — Other Ambulatory Visit: Payer: Self-pay

## 2020-01-01 ENCOUNTER — Encounter: Payer: Self-pay | Admitting: Physician Assistant

## 2020-01-01 ENCOUNTER — Ambulatory Visit (INDEPENDENT_AMBULATORY_CARE_PROVIDER_SITE_OTHER): Payer: Medicare Other | Admitting: Physician Assistant

## 2020-01-01 VITALS — BP 112/72 | HR 91 | Resp 14 | Ht 60.0 in | Wt 144.0 lb

## 2020-01-01 DIAGNOSIS — Z862 Personal history of diseases of the blood and blood-forming organs and certain disorders involving the immune mechanism: Secondary | ICD-10-CM | POA: Diagnosis not present

## 2020-01-01 DIAGNOSIS — Z8659 Personal history of other mental and behavioral disorders: Secondary | ICD-10-CM

## 2020-01-01 DIAGNOSIS — M19071 Primary osteoarthritis, right ankle and foot: Secondary | ICD-10-CM | POA: Diagnosis not present

## 2020-01-01 DIAGNOSIS — Z79899 Other long term (current) drug therapy: Secondary | ICD-10-CM | POA: Diagnosis not present

## 2020-01-01 DIAGNOSIS — Z9049 Acquired absence of other specified parts of digestive tract: Secondary | ICD-10-CM

## 2020-01-01 DIAGNOSIS — M170 Bilateral primary osteoarthritis of knee: Secondary | ICD-10-CM

## 2020-01-01 DIAGNOSIS — M19041 Primary osteoarthritis, right hand: Secondary | ICD-10-CM

## 2020-01-01 DIAGNOSIS — Z8669 Personal history of other diseases of the nervous system and sense organs: Secondary | ICD-10-CM | POA: Diagnosis not present

## 2020-01-01 DIAGNOSIS — Z8639 Personal history of other endocrine, nutritional and metabolic disease: Secondary | ICD-10-CM | POA: Diagnosis not present

## 2020-01-01 DIAGNOSIS — G8929 Other chronic pain: Secondary | ICD-10-CM

## 2020-01-01 DIAGNOSIS — Z8679 Personal history of other diseases of the circulatory system: Secondary | ICD-10-CM

## 2020-01-01 DIAGNOSIS — M0579 Rheumatoid arthritis with rheumatoid factor of multiple sites without organ or systems involvement: Secondary | ICD-10-CM | POA: Diagnosis not present

## 2020-01-01 DIAGNOSIS — Z85828 Personal history of other malignant neoplasm of skin: Secondary | ICD-10-CM

## 2020-01-01 DIAGNOSIS — Z85820 Personal history of malignant melanoma of skin: Secondary | ICD-10-CM | POA: Diagnosis not present

## 2020-01-01 DIAGNOSIS — M19072 Primary osteoarthritis, left ankle and foot: Secondary | ICD-10-CM

## 2020-01-01 DIAGNOSIS — M25561 Pain in right knee: Secondary | ICD-10-CM

## 2020-01-01 DIAGNOSIS — Z8709 Personal history of other diseases of the respiratory system: Secondary | ICD-10-CM

## 2020-01-01 DIAGNOSIS — M19042 Primary osteoarthritis, left hand: Secondary | ICD-10-CM

## 2020-01-01 MED ORDER — PREDNISONE 5 MG PO TABS: ORAL_TABLET | ORAL | 0 refills | Status: DC

## 2020-01-01 NOTE — Progress Notes (Signed)
Office Visit Note  Patient: Katherine Wang             Date of Birth: 1946/07/10           MRN: [id number]             PCP: [name], MD Referring: [name], MD Visit Date: 01/01/2020 Occupation: @GUAROCC @  Subjective:  Right shoulder joint pain   History of Present Illness: RICQUEL DUNGAN is a 74 y.o. female with history of seropositive rheumatoid arthritis and osteoarthritis.  She is taking methotrexate 0.5 mL once weekly, folic acid 2 mg by mouth daily, and Arava 10 mg 1 tablet by mouth daily.  She has been having frequent and severe rheumatoid arthritis flares recently.  She is having pain in the right shoulder, both wrist joints, right knee joint, and both ankle joints.  She is having swelling in the right knee, wrist joints intermittently, and both ankle joints.She presents today with severe pain in the right shoulder joint.  She saw her orthopedist on 12/14/2019 and received a right glenohumeral cortisone injection at that time.  She states her orthopedist recommended a right shoulder replacement.  She will be establishing care with Dr. Mardelle Matte on Monday to discuss surgery.   Activities of Daily Living:  Patient reports morning stiffness for 0 minutes.   Patient Reports nocturnal pain.  Difficulty dressing/grooming: Reports Difficulty climbing stairs: Reports Difficulty getting out of chair: Reports Difficulty using hands for taps, buttons, cutlery, and/or writing: Reports  Review of Systems  Constitutional: Positive for fatigue.  HENT: Negative for mouth sores, mouth dryness and nose dryness.   Eyes: Negative for pain, itching, visual disturbance and dryness.  Respiratory: Negative for cough, hemoptysis, shortness of breath and difficulty breathing.   Cardiovascular: Negative for chest pain, palpitations, hypertension and swelling in legs/feet.  Gastrointestinal: Negative for blood in stool, constipation, diarrhea, nausea and vomiting.  Endocrine: Negative for  increased urination.  Genitourinary: Negative for difficulty urinating and painful urination.  Musculoskeletal: Positive for arthralgias, gait problem, joint pain, joint swelling and morning stiffness. Negative for myalgias, muscle weakness, muscle tenderness and myalgias.  Skin: Negative for color change, pallor, rash, hair loss, nodules/bumps, skin tightness, ulcers and sensitivity to sunlight.  Allergic/Immunologic: Negative for susceptible to infections.  Neurological: Positive for numbness and weakness. Negative for dizziness, headaches and memory loss.  Hematological: Negative for swollen glands.  Psychiatric/Behavioral: Negative for depressed mood, confusion and sleep disturbance. The patient is not nervous/anxious.     PMFS History:  Patient Active Problem List   Diagnosis Date Noted  . Chest pain 04/04/2019  . Anxiety 04/04/2019  . Leukopenia 04/04/2019  . Pneumonia 04/04/2019  . Dyspnea on exertion   . Moderate aortic regurgitation 11/02/2017  . Lightheadedness 11/02/2017  . Tachycardia 07/26/2017  . Atypical chest pain 07/26/2017  . High risk medications (not anticoagulants) long-term use 01/16/2017  . Baker's cyst of knee, left 12/19/2016  . Pain in both knees 12/19/2016  . Pain of both elbows 12/19/2016  . Malignant melanoma ([place]) 12/19/2016  . Basal cell carcinoma 12/19/2016  . Chronic pansinusitis 08/15/2016  . Deviated septum 08/15/2016  . Epistaxis 08/15/2016  . Hypertrophy, nasal, turbinate 08/15/2016  . Upper airway cough syndrome 04/25/2016  . Hx of adenomatous colonic polyps 06/21/2015  . Hyperlipidemia 11/15/2014  . Rheumatoid arthritis ([place]) 09/01/2014  . Pericarditis 04/24/2011  . Aortic valve disorder 02/14/2009  . ELECTROCARDIOGRAM, ABNORMAL 02/14/2009  . PERNICIOUS ANEMIA 07/10/2007  . DEPRESSION 07/03/2007  .  MALAISE AND FATIGUE 07/03/2007  . Hypothyroidism 04/30/2007  . Essential hypertension 04/30/2007  . Aneurysm of thoracic aorta ([place])  02/13/2006    Past Medical History:  Diagnosis Date  . Adenomatous colon polyp 2012  . Allergy   . Anxiety   . Aortic insufficiency   . Arthritis   . Basal cell carcinoma   . Cataract   . Depression   . Diverticulosis   . Fibromyalgia   . Fibromyalgia   . Hyperlipidemia   . Hypertension   . Macular degeneration   . Melanoma ([place])   . Murmur, heart   . Reflux   . Stroke ([place]) 20 years ago   per pt. mild stroke  . Thyroid disease    hypothroidism     Family History  Problem Relation Age of Onset  . Heart disease Mother   . Hypertension Mother   . Mental illness Mother   . Allergies Mother   . Heart disease Father   . Hypertension Father   . Mental illness Father   . Emphysema Father        smoked  . Allergies Father   . Heart attack Father   . Hyperlipidemia Sister   . Hypertension Sister   . Allergies Sister   . Cancer Sister        oral  . Hypertension Sister   . Diabetes Sister   . Allergies Sister   . Epilepsy Son   . Aneurysm Son   . Hyperthyroidism Daughter   . Hypothyroidism Daughter   . Hypertension Daughter   . Colon cancer Neg Hx   . Esophageal cancer Neg Hx   . Rectal cancer Neg Hx   . Stomach cancer Neg Hx   . Pancreatic cancer Neg Hx   . Prostate cancer Neg Hx    Past Surgical History:  Procedure Laterality Date  . ABDOMINAL HYSTERECTOMY    . APPENDECTOMY    . BASAL CELL CARCINOMA EXCISION     nose   . CARDIAC CATHETERIZATION    . CHOLECYSTECTOMY    . COLONOSCOPY     30 + years ago, unsure of where  . EYE SURGERY    . LASIK Bilateral   . MELANOMA EXCISION    . NASAL SEPTUM SURGERY    . TUBAL LIGATION     Social History   Social History Narrative   Lives alone in a one story home.  Has 2 living children.  Retired Engineer, water.     Immunization History  Administered Date(s) Administered  . Influenza, High Dose Seasonal PF 08/16/2016, 12/12/2017, 09/22/2018  . Influenza,inj,Quad PF,6+ Mos 06/30/2019  . PFIZER SARS-COV-2  Vaccination 12/25/2019  . Pneumococcal Conjugate-13 09/01/2014  . Pneumococcal Polysaccharide-23 12/12/2017  . Td 11/29/2006     Objective: Vital Signs: BP 112/72 (BP Location: Left Arm, Patient Position: Sitting, Cuff Size: Normal)   Pulse 91   Resp 14   Ht 5' (1.524 m)   Wt 144 lb (65.3 kg)   BMI 28.12 kg/m    Physical Exam Vitals and nursing note reviewed.  Constitutional:      Appearance: She is well-developed.  HENT:     Head: Normocephalic and atraumatic.  Eyes:     Conjunctiva/sclera: Conjunctivae normal.  Pulmonary:     Effort: Pulmonary effort is normal.  Abdominal:     General: Bowel sounds are normal.     Palpations: Abdomen is soft.  Musculoskeletal:     Cervical back: Normal range of motion.  Lymphadenopathy:  Cervical: No cervical adenopathy.  Skin:    General: Skin is warm and dry.     Capillary Refill: Capillary refill takes less than 2 seconds.  Neurological:     Mental Status: She is alert and oriented to person, place, and time.  Psychiatric:        Behavior: Behavior normal.      Musculoskeletal Exam:C-spine good ROM.  Thoracic kyphosis noted. Right shoulder tenderness and limited ROM with discomfort.  Left shoulder good ROM with no discomfort at this time. Elbow joints good ROM with no tenderness or synovitis.  Tenderness and synovitis of the left wrist joint.  Tenderness of the right CMC joint.  MCPs, PIPs, and DIPs good ROM with no synovitis.  Hip joints good ROM.  Right knee joint painful ROM.  Right knee warmth with minimal effusion.  Left knee has good ROM with no warmth or effusion. Tenderness of both ankle joints.    CDAI Exam: CDAI Score: -- Patient Global: --; Provider Global: -- Swollen: 1 ; Tender: 1  Joint Exam 01/01/2020      Right  Left  Wrist     Swollen Tender     Investigation: No additional findings.  Imaging: No results found.  Recent Labs: Lab Results  Component Value Date   WBC 2.7 (L) 11/10/2019   HGB 11.9  11/10/2019   PLT 230 11/10/2019   NA 139 11/10/2019   K 4.0 11/10/2019   CL 102 11/10/2019   CO2 29 11/10/2019   GLUCOSE 111 (H) 11/10/2019   BUN 14 11/10/2019   CREATININE 0.83 11/10/2019   BILITOT 0.7 11/30/2019   ALKPHOS 58 11/30/2019   AST 19 11/30/2019   ALT 20 11/30/2019   PROT 5.8 (L) 11/30/2019   ALBUMIN 3.5 11/30/2019   CALCIUM 9.1 11/10/2019   GFRAA 80 11/10/2019    Speciality Comments: No specialty comments available.  Procedures:  Large Joint Inj: R knee on 01/01/2020 11:41 AM Indications: pain Details: 27 G 1.5 in needle, medial approach  Arthrogram: No  Medications: 1.5 mL lidocaine 1 %; 40 mg triamcinolone acetonide 40 MG/ML Aspirate: 0 mL Outcome: tolerated well, no immediate complications Procedure, treatment alternatives, risks and benefits explained, specific risks discussed. Consent was given by the patient. Immediately prior to procedure a time out was called to verify the correct patient, procedure, equipment, support staff and site/side marked as required. Patient was prepped and draped in the usual sterile fashion.     Allergies: Tramadol and Statins  Patient has history of rheumatoid arthritis and has been seen in our practice since 2009. Pertinent past medical history includes macular degeneration, melanoma(2005), basal cell carcinoma (2013) and history of neutropenia (medication induced).   Previous therapy includes Enbrel/Humira with an adequate response, Plaquenil, Remicade with good response. Plaquenil was stopped due to good response to methotrexate. Discussed resuming Plaquenil in the past but patient declined as she is concerned due to her macular degeneration.  Reviewed potential medication options and precautions/contraindications below.   Methotrexate: patient currently on and has neutropenia; risk of thrombocytopenia and leukopenia  Plaquenil: Patient declined due to concerns for her macular degeneration  Sulfasalazine: Risk of  myelosuppression  Arava: She is currently on and experiencing neutropenia  Imuran: Risk of myelosuppression, thrombocytopenia, and leukopenia  Anti-TNF: history of melanoma and risk of neutropenia  JAK: risk of myelosupression  IL-6 and IL-1 inhibitors: risk of neutropenia  CD20: risk of neutropenia   [place] does not have a contraindication or precaution for history of melanoma  and less risk for hematologic abnormalities. This could be an option but some providers avoid use as monoclonal antibody treatments that activate T cells have shown benefit in treating melanoma.   Have discussed Rituxian but it can also cause late-onset neutropenia.    Assessment / Plan:     Visit Diagnoses: Rheumatoid arthritis involving multiple sites with positive rheumatoid factor [facility]): She presents today with pain and inflammation of multiple joints.  She has tenderness and painful range of motion of the right shoulder on exam.  She has tenderness and synovitis of the left breast, right knee, and bilateral ankle joints.  She has had significant discomfort in the right shoulder and was evaluated by her orthopedist who performed a cortisone injection on 12/13/2018 which did not provide any relief.  She will be establishing care with Dr. Mardelle Matte on Monday to discuss a right shoulder replacement.  She presents today with severe pain in the right knee joint and has warmth and inflammation on exam today.  She requested a right knee joint cortisone injection.  She tolerated the procedure well.  The procedure note was completed above.  Patient reports that she held her dose of methotrexate and Arava after her Covid vaccine but is planning on resuming both medications today.  We discussed the importance of staying on methotrexate 0.5 mL once weekly, folic acid 2 mg a mouth daily, and Arava 10 mg 1 tablet daily.  Her treatment options are limited due to her history of melanoma and neutropenia.  She does not want to make any  medication changes at this time.  A prednisone taper starting at 20 mg tapering by 5 mg every 4 days was sent to the pharmacy.  She was advised to notify us if she continues to have recurrent flares.  She will follow-up in the office in 2 months.  High risk medications (not anticoagulants) long-term use - Methotrexate 0.5 ml every 7 days, folic acid 1 mg 2 tablets daily, and Arava 10 mg 1 tablet daily.   Primary osteoarthritis of both hands: She has no tenderness or synovitis on exam.  She has complete fist formation bilaterally.  She is experiencing tenderness and synovitis in the left wrist joint.  Primary osteoarthritis of both knees: She presents today with warmth and inflammation in the right knee joint.  She has a small effusion in the right knee joint on exam.  She has been experiencing severe pain which is made it difficult to ambulate.  She requested a right knee joint cortisone injection.  She tolerated the procedure well.  A prednisone taper starting at 20 mg tapering by 5 mg every 4 days was sent to the pharmacy.  She is not having any discomfort in her left knee joint at this time.  Primary osteoarthritis of both feet: She has tenderness of MTP joints on exam.  She wears proper fitting shoes.  History of melanoma - She is not a good candidate for biologic agents.  Rituxan is the only option.  History of macular degeneration: She is apprehensive to restart on Plaquenil due to the concern for MD.  Other medical conditions are listed as follows:   History of cholecystectomy  History of anemia  History of basal cell carcinoma  History of hypothyroidism  History of anxiety  History of depression  History of asthma  History of hypertension  Orders: Orders Placed This Encounter  Procedures  . Large Joint Inj   Meds ordered this encounter  Medications  . predniSONE (DELTASONE)  5 MG tablet    Sig: Take 4 tablets by mouth daily x4 days, 3 tablets by mouth daily x4 days, 2  tablets by mouth daily x4 days, 1 tablet by mouth daily x4 days.    Dispense:  40 tablet    Refill:  0    Face-to-face time spent with patient was 30 minutes. Greater than 50% of time was spent in counseling and coordination of care.  Follow-Up Instructions: Return in about 2 months (around 03/02/2020) for Rheumatoid arthritis, Osteoarthritis.   Ofilia Neas, PA-C  Note - This record has been created using Dragon software.  Chart creation errors have been sought, but may not always  have been located. Such creation errors do not reflect on  the standard of medical care.

## 2020-01-01 NOTE — Patient Instructions (Signed)
Standing Labs We placed an order today for your standing lab work.    Please come back and get your standing labs in April and every 3 months   We have open lab daily Monday through Thursday from 8:30-12:30 PM and 1:30-4:30 PM and Friday from 8:30-12:30 PM and 1:30-4:00 PM at the office of Dr. Shaili Deveshwar.   You may experience shorter wait times on Monday and Friday afternoons. The office is located at [address], [address], [place], [place] [address] No appointment is necessary.   Labs are drawn by [facility].  You may receive a bill from [facility] for your lab work.  If you wish to have your labs drawn at another location, please call the office 24 hours in advance to send orders.  If you have any questions regarding directions or hours of operation,  please call [phone].   Just as a reminder please drink plenty of water prior to coming for your lab work. Thanks!   

## 2020-01-04 ENCOUNTER — Other Ambulatory Visit: Payer: Self-pay | Admitting: *Deleted

## 2020-01-04 DIAGNOSIS — M25511 Pain in right shoulder: Secondary | ICD-10-CM | POA: Diagnosis not present

## 2020-01-04 MED ORDER — LEFLUNOMIDE 10 MG PO TABS: 10.0000 mg | ORAL_TABLET | Freq: Every day | ORAL | 0 refills | Status: DC

## 2020-01-04 NOTE — Telephone Encounter (Signed)
Refill request received via fax  Last Visit: 01/01/20 Next Visit: 03/04/20 Labs: 11/10/19 WBC count is low-2.7. Glucose is 111. Rest of CMP WNL   Okay to refill per Dr./ Estanislado Pandy

## 2020-01-07 ENCOUNTER — Ambulatory Visit: Payer: Medicare Other | Admitting: Physician Assistant

## 2020-01-19 ENCOUNTER — Ambulatory Visit: Payer: Medicare Other | Attending: Internal Medicine

## 2020-01-19 DIAGNOSIS — Z23 Encounter for immunization: Secondary | ICD-10-CM

## 2020-01-19 NOTE — Progress Notes (Signed)
   Covid-19 Vaccination Clinic  Name:  Katherine Wang    MRN: [id number] DOB: 25-Aug-1946  01/19/2020  Katherine Wang was observed post Covid-19 immunization for 15 minutes without incident. She was provided with Vaccine Information Sheet and instruction to access the V-Safe system.   Katherine Wang was instructed to call 911 with any severe reactions post vaccine: Marland Kitchen Difficulty breathing  . Swelling of face and throat  . A fast heartbeat  . A bad rash all over body  . Dizziness and weakness   Immunizations Administered    Name Date Dose VIS Date Route   Pfizer COVID-19 Vaccine 01/19/2020 11:14 AM 0.3 mL 10/09/2019 Intramuscular   Manufacturer: [place]   Lot: [id number]   [place]: [id number]

## 2020-01-26 ENCOUNTER — Ambulatory Visit (INDEPENDENT_AMBULATORY_CARE_PROVIDER_SITE_OTHER): Payer: Medicare Other | Admitting: Pharmacist

## 2020-01-26 ENCOUNTER — Other Ambulatory Visit: Payer: Self-pay

## 2020-01-26 VITALS — BP 128/72 | HR 88

## 2020-01-26 DIAGNOSIS — E7849 Other hyperlipidemia: Secondary | ICD-10-CM | POA: Diagnosis not present

## 2020-01-26 MED ORDER — EZETIMIBE 10 MG PO TABS: 10.0000 mg | ORAL_TABLET | Freq: Every day | ORAL | 1 refills | Status: DC

## 2020-01-26 NOTE — Progress Notes (Signed)
Patient ID: [name]                 DOB: Feb 21, 1946                    MRN: [id number]     HPI: Katherine Wang is a 74 y.o. female patient referred to lipid clinic by Dr Aundra Dubin. PMH is significant for hypertension, hyperlipidemia,  aortic insufficiency, stroke, AAA, pericarditis, aortic regurgitation, and fibromyalgia. Patient is intolerant to atorvastatin and rosuvastatin 20mg  daily. Presents for potential PCSK9i initiation and medication titration.  Current Medications:  Rosuvastatin 10mg  daily  Intolerances:  Rosuvastatin 20mg  - body aches  LDL goal: < 70mg /dL  Diet: working on plant base diet, eating less butter, and less cheese  Exercise: minimal due to chronic pain  Family History: The patient's family history includes Allergies in her father, mother, sister, and sister; Aneurysm in her son; Diabetes in her sister; Emphysema in her father; Epilepsy in her son; Heart attack in her father; Heart disease in her father and mother; Hyperlipidemia in her sister; Hypertension in her daughter, father, mother, sister, and sister; Hyperthyroidism in her daughter; Hypothyroidism in her daughter; Mental illness in her father and mother.  Social History: The patient  reports that she quit smoking about 35 years ago. Her smoking use included cigarettes. She has a 40.00 pack-year smoking history. She has never used smokeless tobacco. She reports that she does not drink alcohol or use drugs.   Labs: 11/30/2019: CHO 164, TG 76, HDL 52, LDL-c 97 (on rosuvastatin 10mg  daily)  Past Medical History:  Diagnosis Date  . Adenomatous colon polyp 2012  . Allergy   . Anxiety   . Aortic insufficiency   . Arthritis   . Basal cell carcinoma   . Cataract   . Depression   . Diverticulosis   . Fibromyalgia   . Fibromyalgia   . Hyperlipidemia   . Hypertension   . Macular degeneration   . Melanoma ([place])   . Murmur, heart   . Reflux   . Stroke ([place]) 20 years ago   per pt. mild stroke  . Thyroid  disease    hypothroidism     Current Outpatient Medications on File Prior to Visit  Medication Sig Dispense Refill  . albuterol (PROVENTIL HFA;VENTOLIN HFA) 108 (90 Base) MCG/ACT inhaler Inhale 2 puffs into the lungs every 6 (six) hours as needed for wheezing or shortness of breath. 18 g 2  . amLODipine (NORVASC) 10 MG tablet Take 1 tablet (10 mg total) by mouth daily. At bedtime 90 tablet 3  . aspirin EC 81 MG tablet Take 1 tablet (81 mg total) by mouth daily. 90 tablet 3  . carvedilol (COREG) 3.125 MG tablet Take 1 tablet (3.125 mg total) by mouth 2 (two) times daily with a meal. 60 tablet 3  . cholecalciferol (VITAMIN D3) 25 MCG (1000 UT) tablet Take 2,000 Units by mouth daily.    . clonazePAM (KLONOPIN) 0.5 MG tablet TAKE 1 TABLET BY MOUTH AT BEDTIME. MAY TAKE 2 TIMES DAILY AS NEEDED ON OCCASION 60 tablet 2  . Cyanocobalamin (VITAMIN B 12 PO) Take 1 tablet by mouth daily.    . cycloSPORINE (RESTASIS) 0.05 % ophthalmic emulsion Place 1 drop into both eyes 2 (two) times daily.     . famotidine (PEPCID) 20 MG tablet Take 1 tablet (20 mg total) by mouth daily. 30 tablet 5  . folic acid (FOLVITE) 1 MG tablet Take 2 mg by  mouth daily.    . hydrochlorothiazide (HYDRODIURIL) 12.5 MG tablet Take 1 Tablet Daily 30 tablet 3  . ibuprofen (ADVIL,MOTRIN) 200 MG tablet Take 600 mg by mouth 2 (two) times daily as needed for mild pain.    Marland Kitchen irbesartan (AVAPRO) 300 MG tablet Take 1 tablet (300 mg total) by mouth daily. 30 tablet 11  . leflunomide (ARAVA) 10 MG tablet Take 1 tablet (10 mg total) by mouth daily. 30 tablet 0  . levothyroxine (SYNTHROID) 112 MCG tablet Take 1 tablet (112 mcg total) by mouth daily. 90 tablet 1  . methotrexate 50 MG/2ML injection ADMINISTER 0.5 ML UNDER THE SKIN 1 TIME A WEEK 6 mL 0  . Multiple Vitamins-Minerals (PRESERVISION AREDS) TABS Take 1 tablet by mouth 2 (two) times daily.    . predniSONE (DELTASONE) 5 MG tablet Take 4 tablets by mouth daily x4 days, 3 tablets by mouth  daily x4 days, 2 tablets by mouth daily x4 days, 1 tablet by mouth daily x4 days. 40 tablet 0  . rosuvastatin (CRESTOR) 10 MG tablet Take 1 tablet (10 mg total) by mouth daily. 30 tablet 5  . sertraline (ZOLOFT) 100 MG tablet TAKE 2 TABLETS(200 MG) BY MOUTH DAILY 180 tablet 1  . TUBERCULIN SYR 1CC/27GX1/2" (SAFETY-LOK TB SYRINGE 27GX.5") 27G X 1/2" 1 ML MISC USE AS DIRECTED 12 each 3   No current facility-administered medications on file prior to visit.    Allergies  Allergen Reactions  . Tramadol Other (See Comments)    dizzy  . Statins     Body ache    Hyperlipidemia LDL remains above goal for secondary prevention. Patient is a good candidate for PCSK9i therapy but will like to avoid injectables for now. We discussed options of adding ezetimibe 10mg  daily, or bempedoic acid to her current therapy. Patient is also transitioning to a plant based diet.   Will add ezetimibe 10mg  daily to current regimen. Continue transition to plant based diet ,and repeat fasting blood work in 2 months. If LDL remains above 70mg /dL in 2 months, we will add Repatha/Praluent every 14 days.    Tokiko Diefenderfer Rodriguez-Guzman PharmD, BCPS, [place] [address] 01/31/2020 2:59 PM

## 2020-01-26 NOTE — Patient Instructions (Addendum)
  Your Results:             Your most recent labs Goal  Total Cholesterol 164 < 200  Triglycerides 76 < 150  HDL (happy/good cholesterol) 52 > 40  LDL (lousy/bad cholesterol 97 < 70     Medication changes: ADD zetia (ezetimibe) 10mg  daily  (plan to start Repatha/Praluent in 2 months if LDL remains above 70mg /dL)  Lab orders: *REPEAT fasting blood work 2 months after starting Zetia*  Patient Assistance:  The [employer] foundation offers assistance to help pay for medication copays.  They will cover copays for all cholesterol lowering meds, including statins, fibrates, omega-3 oils, ezetimibe, Repatha, Praluent, Nexletol, Nexlizet.  The cards are usually good for $2,500 or 12 months, whichever comes first. 1. Go to healthwellfoundation.org 2. Click on "Apply Now" 3. Answer questions as to whom is applying (patient or representative) 4. Your disease fund will be "hypercholesterolemia - Medicare access" 5. They will ask questions about finances and which medications you are taking for cholesterol 6. When you submit, the approval is usually within minutes.  You will need to print the card information from the site 7. You will need to show this information to your pharmacy, they will bill your Medicare Part D plan first -then bill [employer] --for the copay.   You can also call them at [phone], although the hold times can be quite long.   Thank you for choosing [facility]

## 2020-01-28 ENCOUNTER — Telehealth: Payer: Self-pay | Admitting: Rheumatology

## 2020-01-28 MED ORDER — PREDNISONE 5 MG PO TABS: ORAL_TABLET | ORAL | 0 refills | Status: DC

## 2020-01-28 NOTE — Telephone Encounter (Signed)
Patient left a voicemail requesting a prescription of Prednisone.  Patient states she stopped taking her medication for a week after her COVID vaccine and now her hand and knee are swollen.

## 2020-01-28 NOTE — Telephone Encounter (Signed)
Patient she is having swelling her left hand and her right knee. Patient states her left hand her fingers are swollen as well and are painful.  Patient she held her MTX for 1 week after her Covid vaccine. Patient states she restarted the MTX yesterday. Patient states it is warm to the touch. Patient is requesting a prednisone taper. Please advise.

## 2020-01-28 NOTE — Telephone Encounter (Signed)
Patient advised prescription for Prednisone sent to the pharmacy.

## 2020-01-28 NOTE — Telephone Encounter (Signed)
Ok to send in prednisone taper starting at 20 mg tapering by 5 mg every 4 days.

## 2020-01-29 ENCOUNTER — Encounter: Payer: Self-pay | Admitting: Family Medicine

## 2020-01-31 ENCOUNTER — Encounter: Payer: Self-pay | Admitting: Pharmacist

## 2020-01-31 NOTE — Assessment & Plan Note (Signed)
LDL remains above goal for secondary prevention. Patient is a good candidate for PCSK9i therapy but will like to avoid injectables for now. We discussed options of adding ezetimibe 10mg  daily, or bempedoic acid to her current therapy. Patient is also transitioning to a plant based diet.   Will add ezetimibe 10mg  daily to current regimen. Continue transition to plant based diet ,and repeat fasting blood work in 2 months. If LDL remains above 70mg /dL in 2 months, we will add Repatha/Praluent every 14 days.

## 2020-02-18 ENCOUNTER — Other Ambulatory Visit: Payer: Self-pay | Admitting: Rheumatology

## 2020-02-18 NOTE — Telephone Encounter (Signed)
Please wait to refill Arava until she has updated lab work due to low WBC count in January 2021.

## 2020-02-18 NOTE — Telephone Encounter (Signed)
Patient called requesting prescription refill of Leflunomide to be sent to [facility] at [place].  Patient states she is out of medication and requesting it be sent to the pharmacy today.  Patient states she is coming in tomorrow, 02/19/20 in the afternoon for labwork.

## 2020-02-18 NOTE — Telephone Encounter (Signed)
Last Visit: 01/01/2020  Next Visit: 03/04/2020  Labs: 11/10/2019 Glucose is 111. Rest of CMP WNL  WBC count is low-2.7.  Per patient, she will update labs tomorrow 02/19/2020.   Okay to refill 30 day supply of arava 10mg  daily?

## 2020-02-19 NOTE — Telephone Encounter (Signed)
Advised patient we cannot refill Arava until she has updated lab work due to low WBC count in January 2021. patient verbalized understanding and states she cannot update labs today because she has been vomiting this morning.

## 2020-02-22 ENCOUNTER — Encounter: Payer: Self-pay | Admitting: Family Medicine

## 2020-02-22 ENCOUNTER — Other Ambulatory Visit: Payer: Self-pay

## 2020-02-22 ENCOUNTER — Ambulatory Visit (INDEPENDENT_AMBULATORY_CARE_PROVIDER_SITE_OTHER): Payer: Medicare Other | Admitting: Family Medicine

## 2020-02-22 DIAGNOSIS — R059 Cough, unspecified: Secondary | ICD-10-CM

## 2020-02-22 DIAGNOSIS — Z20822 Contact with and (suspected) exposure to covid-19: Secondary | ICD-10-CM | POA: Diagnosis not present

## 2020-02-22 DIAGNOSIS — R05 Cough: Secondary | ICD-10-CM | POA: Diagnosis not present

## 2020-02-22 MED ORDER — DOXYCYCLINE HYCLATE 100 MG PO CAPS: 100.0000 mg | ORAL_CAPSULE | Freq: Two times a day (BID) | ORAL | 0 refills | Status: DC

## 2020-02-22 NOTE — Progress Notes (Addendum)
[facility] at [facility] [address] [place], [place] [address] [phone]  Date:  02/22/2020   Name:  Katherine Wang   DOB:  1946-04-19   MRN:  [id number]  PCP:  [name], MD    Chief Complaint: No chief complaint on file.   History of Present Illness:  Katherine Wang is a 74 y.o. very pleasant female patient who presents with the following:  Telephone visit today for concern of possible illness Patient location is home, provider location is office.  The patient identity was confirmed with 2 factors, she gives consent for virtual visit today.  The patient and myself are present on the call today Patient history of hypertension, rheumatoid arthritis, hypothyroidism Last seen by myself in February for virtual visit  She is under the care of rheumatology, Dr. Estanislado Pandy  She has completed her COVID-19 vaccination series  She notes that over he last 4-5 days she had noted nausea and temp up to 101.  Her temperature is now back to normal, and nausea has improved She feels weak and tired still however She does have chest congestion and is coughing up some phlegm- generally clear  No sinus congestion noted  Occasional sneezing only No PND - this does not seem like a sinus infection  She has noted diarrhea, she vomited once- non post- tussive  She is not aware of any sick contacts   Patient Active Problem List   Diagnosis Date Noted  . Chest pain 04/04/2019  . Anxiety 04/04/2019  . Leukopenia 04/04/2019  . Pneumonia 04/04/2019  . Dyspnea on exertion   . Moderate aortic regurgitation 11/02/2017  . Lightheadedness 11/02/2017  . Tachycardia 07/26/2017  . Atypical chest pain 07/26/2017  . High risk medications (not anticoagulants) long-term use 01/16/2017  . Baker's cyst of knee, left 12/19/2016  . Pain in both knees 12/19/2016  . Pain of both elbows 12/19/2016  . Malignant melanoma ([place]) 12/19/2016  . Basal cell  carcinoma 12/19/2016  . Chronic pansinusitis 08/15/2016  . Deviated septum 08/15/2016  . Epistaxis 08/15/2016  . Hypertrophy, nasal, turbinate 08/15/2016  . Upper airway cough syndrome 04/25/2016  . Hx of adenomatous colonic polyps 06/21/2015  . Hyperlipidemia 11/15/2014  . Rheumatoid arthritis ([place]) 09/01/2014  . Pericarditis 04/24/2011  . Aortic valve disorder 02/14/2009  . ELECTROCARDIOGRAM, ABNORMAL 02/14/2009  . PERNICIOUS ANEMIA 07/10/2007  . DEPRESSION 07/03/2007  . MALAISE AND FATIGUE 07/03/2007  . Hypothyroidism 04/30/2007  . Essential hypertension 04/30/2007  . Aneurysm of thoracic aorta ([place]) 02/13/2006    Past Medical History:  Diagnosis Date  . Adenomatous colon polyp 2012  . Allergy   . Anxiety   . Aortic insufficiency   . Arthritis   . Basal cell carcinoma   . Cataract   . Depression   . Diverticulosis   . Fibromyalgia   . Fibromyalgia   . Hyperlipidemia   . Hypertension   . Macular degeneration   . Melanoma ([place])   . Murmur, heart   . Reflux   . Stroke ([place]) 20 years ago   per pt. mild stroke  . Thyroid disease    hypothroidism     Past Surgical History:  Procedure Laterality Date  . ABDOMINAL HYSTERECTOMY    . APPENDECTOMY    . BASAL CELL CARCINOMA EXCISION     nose   . CARDIAC CATHETERIZATION    . CHOLECYSTECTOMY    . COLONOSCOPY  30 + years ago, unsure of where  . EYE SURGERY    . LASIK Bilateral   . MELANOMA EXCISION    . NASAL SEPTUM SURGERY    . TUBAL LIGATION      Social History   Tobacco Use  . Smoking status: Former Smoker    Packs/day: 2.00    Years: 20.00    Pack years: 40.00    Types: Cigarettes    Quit date: 10/30/1983    Years since quitting: 36.3  . Smokeless tobacco: Never Used  Substance Use Topics  . Alcohol use: No    Alcohol/week: 0.0 standard drinks  . Drug use: Never    Family History  Problem Relation Age of Onset  . Heart disease Mother   . Hypertension Mother   . Mental illness Mother   .  Allergies Mother   . Heart disease Father   . Hypertension Father   . Mental illness Father   . Emphysema Father        smoked  . Allergies Father   . Heart attack Father   . Hyperlipidemia Sister   . Hypertension Sister   . Allergies Sister   . Cancer Sister        oral  . Hypertension Sister   . Diabetes Sister   . Allergies Sister   . Epilepsy Son   . Aneurysm Son   . Hyperthyroidism Daughter   . Hypothyroidism Daughter   . Hypertension Daughter   . Colon cancer Neg Hx   . Esophageal cancer Neg Hx   . Rectal cancer Neg Hx   . Stomach cancer Neg Hx   . Pancreatic cancer Neg Hx   . Prostate cancer Neg Hx     Allergies  Allergen Reactions  . Tramadol Other (See Comments)    dizzy  . Statins     Body ache    Medication list has been reviewed and updated.  Current Outpatient Medications on File Prior to Visit  Medication Sig Dispense Refill  . albuterol (PROVENTIL HFA;VENTOLIN HFA) 108 (90 Base) MCG/ACT inhaler Inhale 2 puffs into the lungs every 6 (six) hours as needed for wheezing or shortness of breath. 18 g 2  . amLODipine (NORVASC) 10 MG tablet Take 1 tablet (10 mg total) by mouth daily. At bedtime 90 tablet 3  . aspirin EC 81 MG tablet Take 1 tablet (81 mg total) by mouth daily. 90 tablet 3  . carvedilol (COREG) 3.125 MG tablet Take 1 tablet (3.125 mg total) by mouth 2 (two) times daily with a meal. 60 tablet 3  . cholecalciferol (VITAMIN D3) 25 MCG (1000 UT) tablet Take 2,000 Units by mouth daily.    . clonazePAM (KLONOPIN) 0.5 MG tablet TAKE 1 TABLET BY MOUTH AT BEDTIME. MAY TAKE 2 TIMES DAILY AS NEEDED ON OCCASION 60 tablet 2  . Cyanocobalamin (VITAMIN B 12 PO) Take 1 tablet by mouth daily.    . cycloSPORINE (RESTASIS) 0.05 % ophthalmic emulsion Place 1 drop into both eyes 2 (two) times daily.     Marland Kitchen ezetimibe (ZETIA) 10 MG tablet Take 1 tablet (10 mg total) by mouth daily. 30 tablet 1  . famotidine (PEPCID) 20 MG tablet Take 1 tablet (20 mg total) by mouth  daily. 30 tablet 5  . folic acid (FOLVITE) 1 MG tablet Take 2 mg by mouth daily.    . hydrochlorothiazide (HYDRODIURIL) 12.5 MG tablet Take 1 Tablet Daily 30 tablet 3  . ibuprofen (ADVIL,MOTRIN) 200 MG tablet Take  600 mg by mouth 2 (two) times daily as needed for mild pain.    Marland Kitchen irbesartan (AVAPRO) 300 MG tablet Take 1 tablet (300 mg total) by mouth daily. 30 tablet 11  . leflunomide (ARAVA) 10 MG tablet Take 1 tablet (10 mg total) by mouth daily. 30 tablet 0  . levothyroxine (SYNTHROID) 112 MCG tablet Take 1 tablet (112 mcg total) by mouth daily. 90 tablet 1  . methotrexate 50 MG/2ML injection ADMINISTER 0.5 ML UNDER THE SKIN 1 TIME A WEEK 6 mL 0  . Multiple Vitamins-Minerals (PRESERVISION AREDS) TABS Take 1 tablet by mouth 2 (two) times daily.    . predniSONE (DELTASONE) 5 MG tablet Take 4 tablets by mouth daily x4 days, 3 tablets by mouth daily x4 days, 2 tablets by mouth daily x4 days, 1 tablet by mouth daily x4 days. 40 tablet 0  . predniSONE (DELTASONE) 5 MG tablet Take 4 tabs po x 4 days, 3  tabs po x 4 days, 2  tabs po x 4 days, 1  tab po x 4 days 40 tablet 0  . rosuvastatin (CRESTOR) 10 MG tablet Take 1 tablet (10 mg total) by mouth daily. 30 tablet 5  . sertraline (ZOLOFT) 100 MG tablet TAKE 2 TABLETS(200 MG) BY MOUTH DAILY 180 tablet 1  . TUBERCULIN SYR 1CC/27GX1/2" (SAFETY-LOK TB SYRINGE 27GX.5") 27G X 1/2" 1 ML MISC USE AS DIRECTED 12 each 3   No current facility-administered medications on file prior to visit.    Review of Systems:  As per HPI- otherwise negative.   Physical Examination: There were no vitals filed for this visit. There were no vitals filed for this visit. There is no height or weight on file to calculate BMI. Ideal Body Weight:    Spoke to pt on the telephone She sounds well, no cough or distress noted  She has checked her oxygen level- no lower than 95, generally 97  Assessment and Plan: Cough - Plan: doxycycline (VIBRAMYCIN) 100 MG  capsule  Telephone visit today for concern of illness.  Patient has noticed chest congestion cough, as well as a fairly low-grade temperature for the past 4 to 5 days.  Her fever has now resolved.  She does not seem to have symptoms of a sinus infection She has had COVID-19 series already  Discussed with patient in detail I would advise her to be tested for COVID-19, just to rule this out and make sure she does not need to quarantine We will prescribe doxycycline for her to take twice a day for 10 days for presumed bronchitis  She is asked to let me know if not feeling better in the next few days, sooner if worse-she agrees with plan  Signed Lamar Blinks, MD Spoke to pt for about 10 minutes

## 2020-02-23 ENCOUNTER — Telehealth: Payer: Self-pay | Admitting: Rheumatology

## 2020-02-23 ENCOUNTER — Other Ambulatory Visit (HOSPITAL_COMMUNITY): Payer: Self-pay

## 2020-02-23 MED ORDER — CARVEDILOL 3.125 MG PO TABS: 3.1250 mg | ORAL_TABLET | Freq: Two times a day (BID) | ORAL | 3 refills | Status: AC

## 2020-02-23 NOTE — Telephone Encounter (Signed)
Patient called stating she was planning to have her labwork this week so she could have her [place] refilled.  Patient states she saw her PCP yesterday, 02/22/20 who advised her to have a COVID test due to symptoms of cough, fever, and nausea even though she had both vaccines.  Patient states she plans to get the COVID test in the next 1-2 days but isn't sure where she can go for her labwork.  Patient is requesting a return call.

## 2020-02-23 NOTE — Telephone Encounter (Signed)
Patient states she is feeling better today and does not have a fever. Patient has an appointment for a COVID test on 02/25/2020 and pending results, will have [place] send her lab orders [facility]. Patient will call the office.

## 2020-02-24 DIAGNOSIS — Z20828 Contact with and (suspected) exposure to other viral communicable diseases: Secondary | ICD-10-CM | POA: Diagnosis not present

## 2020-02-24 DIAGNOSIS — Z03818 Encounter for observation for suspected exposure to other biological agents ruled out: Secondary | ICD-10-CM | POA: Diagnosis not present

## 2020-02-26 ENCOUNTER — Telehealth: Payer: Self-pay | Admitting: Rheumatology

## 2020-02-26 ENCOUNTER — Telehealth: Payer: Self-pay

## 2020-02-26 DIAGNOSIS — M0579 Rheumatoid arthritis with rheumatoid factor of multiple sites without organ or systems involvement: Secondary | ICD-10-CM

## 2020-02-26 MED ORDER — PREDNISONE 5 MG PO TABS: ORAL_TABLET | ORAL | 0 refills | Status: DC

## 2020-02-26 NOTE — Telephone Encounter (Signed)
Ok to send in a prednisone taper starting at 20 mg tapering by 5 mg every 2 days.

## 2020-02-26 NOTE — Telephone Encounter (Signed)
Patient is getting labs drawn today. Unable to refill arava until labs are updated due to low WBC count in January 2021. Patient is requesting a prednisone taper since she is out of arava. Please advise.

## 2020-02-26 NOTE — Telephone Encounter (Signed)
Patient calling because she was tested for COVID on Wednesday, and results were negative. Patient requesting to send lab orders to [facility] for draw.

## 2020-02-26 NOTE — Telephone Encounter (Signed)
Lab orders released for [facility], patient is aware.

## 2020-02-26 NOTE — Telephone Encounter (Signed)
Prednisone taper sent to the pharmacy, patient is aware.  

## 2020-02-27 LAB — CBC WITH DIFFERENTIAL/PLATELET
Absolute Monocytes: 120 cells/uL — ABNORMAL LOW (ref 200–950)
Basophils Absolute: 11 cells/uL (ref 0–200)
Basophils Relative: 0.5 %
Eosinophils Absolute: 40 cells/uL (ref 15–500)
Eosinophils Relative: 1.9 %
HCT: 35.1 % (ref 35.0–45.0)
Hemoglobin: 11.5 g/dL — ABNORMAL LOW (ref 11.7–15.5)
Lymphs Abs: 1054 cells/uL (ref 850–3900)
MCH: 32.7 pg (ref 27.0–33.0)
MCHC: 32.8 g/dL (ref 32.0–36.0)
MCV: 99.7 fL (ref 80.0–100.0)
MPV: 9.3 fL (ref 7.5–12.5)
Monocytes Relative: 5.7 %
Neutro Abs: 876 cells/uL — ABNORMAL LOW (ref 1500–7800)
Neutrophils Relative %: 41.7 %
Platelets: 244 10*3/uL (ref 140–400)
RBC: 3.52 10*6/uL — ABNORMAL LOW (ref 3.80–5.10)
RDW: 15.4 % — ABNORMAL HIGH (ref 11.0–15.0)
Total Lymphocyte: 50.2 %
WBC: 2.1 10*3/uL — ABNORMAL LOW (ref 3.8–10.8)

## 2020-02-27 LAB — COMPLETE METABOLIC PANEL WITH GFR
AG Ratio: 1.7 (calc) (ref 1.0–2.5)
ALT: 20 U/L (ref 6–29)
AST: 22 U/L (ref 10–35)
Albumin: 3.8 g/dL (ref 3.6–5.1)
Alkaline phosphatase (APISO): 68 U/L (ref 37–153)
BUN: 15 mg/dL (ref 7–25)
CO2: 26 mmol/L (ref 20–32)
Calcium: 9 mg/dL (ref 8.6–10.4)
Chloride: 99 mmol/L (ref 98–110)
Creat: 0.74 mg/dL (ref 0.60–0.93)
GFR, Est African American: 91 mL/min/{1.73_m2} (ref >=60)
GFR, Est Non African American: 79 mL/min/{1.73_m2} (ref >=60)
Globulin: 2.2 g/dL (calc) (ref 1.9–3.7)
Glucose, Bld: 84 mg/dL (ref 65–139)
Potassium: 3.6 mmol/L (ref 3.5–5.3)
Sodium: 136 mmol/L (ref 135–146)
Total Bilirubin: 0.6 mg/dL (ref 0.2–1.2)
Total Protein: 6 g/dL — ABNORMAL LOW (ref 6.1–8.1)

## 2020-02-29 ENCOUNTER — Telehealth: Payer: Self-pay

## 2020-02-29 DIAGNOSIS — Z79899 Other long term (current) drug therapy: Secondary | ICD-10-CM

## 2020-02-29 NOTE — Telephone Encounter (Signed)
Advised patient to discontinue methotrexate and Arava due to neutropenia. She should have repeat CBC in 2 weeks. Patient would like to know what she can do for pain since she is having to discontinue medications. Patient is currently taking prednisone prescription that was sent to the pharmacy on 02/26/2020. Patient has noticed some improvement while being on prednisone.

## 2020-02-29 NOTE — Telephone Encounter (Signed)
Please have patient discontinue methotrexate and Arava due to neutropenia.  She should have repeat CBC in 2 weeks.  Please forward labs to her PCP

## 2020-02-29 NOTE — Telephone Encounter (Signed)
Advised patient to complete the prednisone taper, and we will further discuss treatment options at her follow up visit on 03/16/20. Patient verbalized understanding.

## 2020-02-29 NOTE — Telephone Encounter (Signed)
Please advise the patient to complete the prednisone taper, and we will further discuss treatment options at her follow up visit on 03/16/20.

## 2020-03-04 ENCOUNTER — Ambulatory Visit: Payer: Medicare Other | Admitting: Physician Assistant

## 2020-03-08 NOTE — Progress Notes (Signed)
Office Visit Note  Patient: Katherine Wang             Date of Birth: 07/19/1946           MRN: [id number]             PCP: [name], MD Referring: [name], MD Visit Date: 03/16/2020 Occupation: @GUAROCC @  Subjective:  Discuss medication options   History of Present Illness: MARCEY KOSANKE is a 74 y.o. female with history of seropositive rheumatoid arthritis and osteoarthritis. She is currently taking a prednisone taper starting at 20 mg tapering by 5 mg every week.  She is on prednisone 15 mg daily currently.  She is holding MTX due to neutropenia. She states she reached out to her dermatologist for clearance to restart on an anti-TNF inhibitor but has not heard back.  She states she was diagnosed with melanoma over 15 year ago and has not had any recurrence.     Activities of Daily Living:  Patient reports morning stiffness for 0 minutes.   Patient Denies nocturnal pain.  Difficulty dressing/grooming: Reports Difficulty climbing stairs: Denies Difficulty getting out of chair: Denies Difficulty using hands for taps, buttons, cutlery, and/or writing: Reports  Review of Systems  Constitutional: Positive for fatigue.  HENT: Negative for mouth sores, mouth dryness and nose dryness.   Eyes: Positive for dryness. Negative for pain, itching and visual disturbance.  Respiratory: Negative for cough, hemoptysis, shortness of breath and difficulty breathing.   Cardiovascular: Negative for chest pain, palpitations, hypertension and swelling in legs/feet.  Gastrointestinal: Positive for constipation. Negative for blood in stool and diarrhea.  Endocrine: Negative for increased urination.  Genitourinary: Negative for difficulty urinating and painful urination.  Musculoskeletal: Positive for arthralgias, joint pain and joint swelling. Negative for myalgias, muscle weakness, morning stiffness, muscle tenderness and myalgias.  Skin: Negative for color change, pallor, rash, hair  loss, nodules/bumps, redness, skin tightness, ulcers and sensitivity to sunlight.  Allergic/Immunologic: Negative for susceptible to infections.  Neurological: Negative for dizziness, numbness, headaches and memory loss.  Hematological: Negative for swollen glands.  Psychiatric/Behavioral: Negative for depressed mood, confusion and sleep disturbance. The patient is not nervous/anxious.     PMFS History:  Patient Active Problem List   Diagnosis Date Noted  . Chest pain 04/04/2019  . Anxiety 04/04/2019  . Leukopenia 04/04/2019  . Pneumonia 04/04/2019  . Dyspnea on exertion   . Moderate aortic regurgitation 11/02/2017  . Lightheadedness 11/02/2017  . Tachycardia 07/26/2017  . Atypical chest pain 07/26/2017  . High risk medications (not anticoagulants) long-term use 01/16/2017  . Baker's cyst of knee, left 12/19/2016  . Pain in both knees 12/19/2016  . Pain of both elbows 12/19/2016  . Malignant melanoma ([place]) 12/19/2016  . Basal cell carcinoma 12/19/2016  . Chronic pansinusitis 08/15/2016  . Deviated septum 08/15/2016  . Epistaxis 08/15/2016  . Hypertrophy, nasal, turbinate 08/15/2016  . Upper airway cough syndrome 04/25/2016  . Hx of adenomatous colonic polyps 06/21/2015  . Hyperlipidemia 11/15/2014  . Rheumatoid arthritis ([place]) 09/01/2014  . Pericarditis 04/24/2011  . Aortic valve disorder 02/14/2009  . ELECTROCARDIOGRAM, ABNORMAL 02/14/2009  . PERNICIOUS ANEMIA 07/10/2007  . DEPRESSION 07/03/2007  . MALAISE AND FATIGUE 07/03/2007  . Hypothyroidism 04/30/2007  . Essential hypertension 04/30/2007  . Aneurysm of thoracic aorta ([place]) 02/13/2006    Past Medical History:  Diagnosis Date  . Adenomatous colon polyp 2012  . Allergy   . Anxiety   . Aortic insufficiency   .  Arthritis   . Basal cell carcinoma   . Cataract   . Depression   . Diverticulosis   . Fibromyalgia   . Fibromyalgia   . Hyperlipidemia   . Hypertension   . Macular degeneration   . Melanoma ([place])     . Murmur, heart   . Reflux   . Stroke ([place]) 20 years ago   per pt. mild stroke  . Thyroid disease    hypothroidism     Family History  Problem Relation Age of Onset  . Heart disease Mother   . Hypertension Mother   . Mental illness Mother   . Allergies Mother   . Heart disease Father   . Hypertension Father   . Mental illness Father   . Emphysema Father        smoked  . Allergies Father   . Heart attack Father   . Hyperlipidemia Sister   . Hypertension Sister   . Allergies Sister   . Cancer Sister        oral  . Hypertension Sister   . Diabetes Sister   . Allergies Sister   . Epilepsy Son   . Aneurysm Son   . Hyperthyroidism Daughter   . Hypothyroidism Daughter   . Hypertension Daughter   . Colon cancer Neg Hx   . Esophageal cancer Neg Hx   . Rectal cancer Neg Hx   . Stomach cancer Neg Hx   . Pancreatic cancer Neg Hx   . Prostate cancer Neg Hx    Past Surgical History:  Procedure Laterality Date  . ABDOMINAL HYSTERECTOMY    . APPENDECTOMY    . BASAL CELL CARCINOMA EXCISION     nose   . CARDIAC CATHETERIZATION    . CHOLECYSTECTOMY    . COLONOSCOPY     30 + years ago, unsure of where  . EYE SURGERY    . LASIK Bilateral   . MELANOMA EXCISION    . NASAL SEPTUM SURGERY    . TUBAL LIGATION     Social History   Social History Narrative   Lives alone in a one story home.  Has 2 living children.  Retired Engineer, water.     Immunization History  Administered Date(s) Administered  . Influenza, High Dose Seasonal PF 08/16/2016, 12/12/2017, 09/22/2018  . Influenza,inj,Quad PF,6+ Mos 06/30/2019  . PFIZER SARS-COV-2 Vaccination 12/25/2019, 01/19/2020  . Pneumococcal Conjugate-13 09/01/2014  . Pneumococcal Polysaccharide-23 12/12/2017  . Td 11/29/2006     Objective: Vital Signs: BP 116/84 (BP Location: Left Arm, Patient Position: Sitting, Cuff Size: Normal)   Pulse 92   Resp 12   Ht 5' (1.524 m)   Wt 153 lb 9.6 oz (69.7 kg)   BMI 30.00 kg/m    Physical  Exam Vitals and nursing note reviewed.  Constitutional:      Appearance: She is well-developed.  HENT:     Head: Normocephalic and atraumatic.  Eyes:     Conjunctiva/sclera: Conjunctivae normal.  Pulmonary:     Effort: Pulmonary effort is normal.  Abdominal:     General: Bowel sounds are normal.     Palpations: Abdomen is soft.  Musculoskeletal:     Cervical back: Normal range of motion.  Lymphadenopathy:     Cervical: No cervical adenopathy.  Skin:    General: Skin is warm and dry.     Capillary Refill: Capillary refill takes less than 2 seconds.  Neurological:     Mental Status: She is alert and oriented to person,  place, and time.  Psychiatric:        Behavior: Behavior normal.      Musculoskeletal Exam: C-spine, thoracic spine, lumbar spine good range of motion.  Shoulder joints, elbow joints, wrist joints, MCPs, PIPs, DIPs good range of motion with no synovitis.  She has PIP and DIP thickening consistent with osteoarthritis of both hands.  Hip joints, knee joints and ankle joints, MTPs, PIPs and DIPs good range of motion with no synovitis.  No warmth or effusion of bilateral knee joints.  No tenderness or swelling of ankle joints.  CDAI Exam: CDAI Score: -- Patient Global: --; Provider Global: -- Swollen: --; Tender: -- Joint Exam 03/16/2020   No joint exam has been documented for this visit   There is currently no information documented on the homunculus. Go to the Rheumatology activity and complete the homunculus joint exam.  Investigation: No additional findings.  Imaging: No results found.  Recent Labs: Lab Results  Component Value Date   WBC 3.6 (L) 03/14/2020   HGB 12.1 03/14/2020   PLT 221 03/14/2020   NA 136 02/26/2020   K 3.6 02/26/2020   CL 99 02/26/2020   CO2 26 02/26/2020   GLUCOSE 84 02/26/2020   BUN 15 02/26/2020   CREATININE 0.74 02/26/2020   BILITOT 0.6 02/26/2020   ALKPHOS 58 11/30/2019   AST 22 02/26/2020   ALT 20 02/26/2020   PROT  6.0 (L) 02/26/2020   ALBUMIN 3.5 11/30/2019   CALCIUM 9.0 02/26/2020   GFRAA 91 02/26/2020    Speciality Comments: No specialty comments available.  Procedures:  No procedures performed Allergies: Tramadol and Statins   Assessment / Plan:     Visit Diagnoses: Rheumatoid arthritis involving multiple sites with positive rheumatoid factor ([place]): She has no synovitis or tenderness on examination today.  She is currently taking a prednisone taper which started at 20 mg tapering by 5 mg every week.  She is currently taking 15 mg of prednisone daily.  She continues to hold methotrexate and Areva due to neutropenia.  Her white blood cell count has trended up to 3.6 on 03/14/20.  She has a history of melanoma but had surgical excision over 15 years ago and has not had any recurrence.  She has reached out to her dermatologist for clearance to restart on an anti-TNF inhibitor.  We will also reach out to Dr. Fontaine No at [facility] dermatology to further discuss.  We will apply for an office Cimzia subcutaneous injections in the meantime.  She will follow-up in the office in 6 weeks.  High risk medications (not anticoagulants) long-term use - We will apply for in-office cimzia injections.  she will require written clearance by her dermatologist prior to starting on cimzia. She discontinued MTX and Arava due to neutropenia. Baseline immunosuppressive labs were ordered today in anticipation for starting on cimzia. - Plan: QuantiFERON-TB Gold Plus, Serum protein electrophoresis with reflex, IgG, IgA, IgM, Hepatitis B surface antigen, Hepatitis B core antibody, IgM, Hepatitis C antibody  Primary osteoarthritis of both hands: She has PIP and DIP thickening consistent with osteoarthritis of both hands.  She has Mar-Mac joint prominence bilaterally.  Joint protection and muscle strengthening were discussed.  Primary osteoarthritis of both knees: She has good range of motion of both knee joints on exam.  No warmth or  effusion was noted.  She is currently taking a prednisone taper.  Primary osteoarthritis of both feet: She is not having any discomfort in her feet at this time.  She  is wearing proper fitting shoes.  History of melanoma -she was diagnosed with melanoma over 15 years ago and has not had any recurrence.  She reached out to her dermatologist at [facility] dermatology for clearance to start on a anti-TNF inhibitor.  We will also reach out to her dermatologist to further discuss.   History of macular degeneration - She is apprehensive to restart on Plaquenil due to the concern for MD.  Other medical conditions are listed as follows:  History of anemia  History of cholecystectomy  History of hypothyroidism  History of basal cell carcinoma  History of hypertension  History of anxiety  History of asthma  History of depression  Orders: Orders Placed This Encounter  Procedures  . QuantiFERON-TB Gold Plus  . Serum protein electrophoresis with reflex  . IgG, IgA, IgM  . Hepatitis B surface antigen  . Hepatitis B core antibody, IgM  . Hepatitis C antibody   No orders of the defined types were placed in this encounter.    Follow-Up Instructions: Return in about 6 weeks (around 04/27/2020) for Rheumatoid arthritis.  Hazel Sams, PA-C   I examined and evaluated the patient with Hazel Sams PA.  Patient has been feeling much better on prednisone.  She had an adequate response to Plaquenil and methotrexate.  She has done much better on Remicade in the past.  We had detailed discussion regarding different treatment options.  She does not like injectable medications.  We discussed the possible use of in office Cimzia.  We will apply for it.  I will get approval from Dr. Derrel Nip as well as patient has history of melanoma.  The plan of care was discussed as noted above.  Bo Merino, MD  Note - This record has been created using Editor, commissioning.  Chart creation errors have been  sought, but may not always  have been located. Such creation errors do not reflect on  the standard of medical care.

## 2020-03-10 ENCOUNTER — Other Ambulatory Visit: Payer: Self-pay

## 2020-03-10 ENCOUNTER — Encounter: Payer: Self-pay | Admitting: Rheumatology

## 2020-03-10 ENCOUNTER — Ambulatory Visit (INDEPENDENT_AMBULATORY_CARE_PROVIDER_SITE_OTHER): Payer: Medicare Other | Admitting: Rheumatology

## 2020-03-10 VITALS — BP 127/79 | HR 84 | Resp 16 | Ht 60.0 in | Wt 155.4 lb

## 2020-03-10 DIAGNOSIS — M19042 Primary osteoarthritis, left hand: Secondary | ICD-10-CM

## 2020-03-10 DIAGNOSIS — M0579 Rheumatoid arthritis with rheumatoid factor of multiple sites without organ or systems involvement: Secondary | ICD-10-CM

## 2020-03-10 DIAGNOSIS — Z8659 Personal history of other mental and behavioral disorders: Secondary | ICD-10-CM | POA: Diagnosis not present

## 2020-03-10 DIAGNOSIS — M170 Bilateral primary osteoarthritis of knee: Secondary | ICD-10-CM | POA: Diagnosis not present

## 2020-03-10 DIAGNOSIS — I359 Nonrheumatic aortic valve disorder, unspecified: Secondary | ICD-10-CM

## 2020-03-10 DIAGNOSIS — Z85820 Personal history of malignant melanoma of skin: Secondary | ICD-10-CM | POA: Diagnosis not present

## 2020-03-10 DIAGNOSIS — Z9049 Acquired absence of other specified parts of digestive tract: Secondary | ICD-10-CM | POA: Diagnosis not present

## 2020-03-10 DIAGNOSIS — M19041 Primary osteoarthritis, right hand: Secondary | ICD-10-CM | POA: Diagnosis not present

## 2020-03-10 DIAGNOSIS — M19071 Primary osteoarthritis, right ankle and foot: Secondary | ICD-10-CM | POA: Diagnosis not present

## 2020-03-10 DIAGNOSIS — Z8639 Personal history of other endocrine, nutritional and metabolic disease: Secondary | ICD-10-CM | POA: Diagnosis not present

## 2020-03-10 DIAGNOSIS — Z85828 Personal history of other malignant neoplasm of skin: Secondary | ICD-10-CM | POA: Diagnosis not present

## 2020-03-10 DIAGNOSIS — Z862 Personal history of diseases of the blood and blood-forming organs and certain disorders involving the immune mechanism: Secondary | ICD-10-CM | POA: Diagnosis not present

## 2020-03-10 DIAGNOSIS — Z8709 Personal history of other diseases of the respiratory system: Secondary | ICD-10-CM

## 2020-03-10 DIAGNOSIS — M19072 Primary osteoarthritis, left ankle and foot: Secondary | ICD-10-CM

## 2020-03-10 DIAGNOSIS — Z79899 Other long term (current) drug therapy: Secondary | ICD-10-CM

## 2020-03-10 DIAGNOSIS — I10 Essential (primary) hypertension: Secondary | ICD-10-CM

## 2020-03-10 DIAGNOSIS — Z8669 Personal history of other diseases of the nervous system and sense organs: Secondary | ICD-10-CM

## 2020-03-10 MED ORDER — PREDNISONE 5 MG PO TABS: ORAL_TABLET | ORAL | 0 refills | Status: DC

## 2020-03-10 NOTE — Progress Notes (Signed)
Office Visit Note  Patient: Katherine Wang             Date of Birth: 07/23/1946           MRN: [id number]             PCP: [name], MD Referring: [name], MD Visit Date: 03/10/2020 Occupation: @GUAROCC @  Subjective:  Right knee joint pain and swelling  History of Present Illness: CALINA SUTERA is a 74 y.o. female with history of seropositive rheumatoid arthritis and osteoarthritis.  She is off of MTX and Arava due to low white blood cell count.  According to the patient she was recently taking doxycycline and is unsure if this caused her white blood cell count to further drop.  She presents today with pain and swelling in the right knee joint.  She had a cortisone injection on 01/01/20.  She just completed a prednisone taper starting at 20 mg tapering by 5 mg every 2 days, which provided temporary relief.  She has intermittent pain and inflammation in both hands.   Activities of Daily Living:  Patient reports morning stiffness for 5 minutes.   Patient Reports nocturnal pain.  Difficulty dressing/grooming: Reports Difficulty climbing stairs: Reports Difficulty getting out of chair: Reports Difficulty using hands for taps, buttons, cutlery, and/or writing: Reports  Review of Systems  Constitutional: Positive for fatigue.  HENT: Negative for mouth sores, mouth dryness and nose dryness.   Eyes: Positive for dryness. Negative for pain and visual disturbance.  Respiratory: Negative for cough, hemoptysis, shortness of breath and difficulty breathing.   Cardiovascular: Negative for chest pain, palpitations, hypertension and swelling in legs/feet.  Gastrointestinal: Positive for constipation. Negative for blood in stool and diarrhea.  Endocrine: Positive for increased urination.  Genitourinary: Negative for painful urination.  Musculoskeletal: Positive for arthralgias, gait problem, joint pain, joint swelling, myalgias, muscle weakness, morning stiffness, muscle  tenderness and myalgias.  Skin: Negative for color change, pallor, rash, hair loss, nodules/bumps, skin tightness, ulcers and sensitivity to sunlight.  Allergic/Immunologic: Negative for susceptible to infections.  Neurological: Positive for dizziness. Negative for numbness, headaches and weakness.  Hematological: Negative for swollen glands.  Psychiatric/Behavioral: Positive for sleep disturbance. Negative for depressed mood. The patient is not nervous/anxious.     PMFS History:  Patient Active Problem List   Diagnosis Date Noted  . Chest pain 04/04/2019  . Anxiety 04/04/2019  . Leukopenia 04/04/2019  . Pneumonia 04/04/2019  . Dyspnea on exertion   . Moderate aortic regurgitation 11/02/2017  . Lightheadedness 11/02/2017  . Tachycardia 07/26/2017  . Atypical chest pain 07/26/2017  . High risk medications (not anticoagulants) long-term use 01/16/2017  . Baker's cyst of knee, left 12/19/2016  . Pain in both knees 12/19/2016  . Pain of both elbows 12/19/2016  . Malignant melanoma ([place]) 12/19/2016  . Basal cell carcinoma 12/19/2016  . Chronic pansinusitis 08/15/2016  . Deviated septum 08/15/2016  . Epistaxis 08/15/2016  . Hypertrophy, nasal, turbinate 08/15/2016  . Upper airway cough syndrome 04/25/2016  . Hx of adenomatous colonic polyps 06/21/2015  . Hyperlipidemia 11/15/2014  . Rheumatoid arthritis ([place]) 09/01/2014  . Pericarditis 04/24/2011  . Aortic valve disorder 02/14/2009  . ELECTROCARDIOGRAM, ABNORMAL 02/14/2009  . PERNICIOUS ANEMIA 07/10/2007  . DEPRESSION 07/03/2007  . MALAISE AND FATIGUE 07/03/2007  . Hypothyroidism 04/30/2007  . Essential hypertension 04/30/2007  . Aneurysm of thoracic aorta ([place]) 02/13/2006    Past Medical History:  Diagnosis Date  . Adenomatous colon polyp 2012  .  Allergy   . Anxiety   . Aortic insufficiency   . Arthritis   . Basal cell carcinoma   . Cataract   . Depression   . Diverticulosis   . Fibromyalgia   . Fibromyalgia   .  Hyperlipidemia   . Hypertension   . Macular degeneration   . Melanoma ([place])   . Murmur, heart   . Reflux   . Stroke ([place]) 20 years ago   per pt. mild stroke  . Thyroid disease    hypothroidism     Family History  Problem Relation Age of Onset  . Heart disease Mother   . Hypertension Mother   . Mental illness Mother   . Allergies Mother   . Heart disease Father   . Hypertension Father   . Mental illness Father   . Emphysema Father        smoked  . Allergies Father   . Heart attack Father   . Hyperlipidemia Sister   . Hypertension Sister   . Allergies Sister   . Cancer Sister        oral  . Hypertension Sister   . Diabetes Sister   . Allergies Sister   . Epilepsy Son   . Aneurysm Son   . Hyperthyroidism Daughter   . Hypothyroidism Daughter   . Hypertension Daughter   . Colon cancer Neg Hx   . Esophageal cancer Neg Hx   . Rectal cancer Neg Hx   . Stomach cancer Neg Hx   . Pancreatic cancer Neg Hx   . Prostate cancer Neg Hx    Past Surgical History:  Procedure Laterality Date  . ABDOMINAL HYSTERECTOMY    . APPENDECTOMY    . BASAL CELL CARCINOMA EXCISION     nose   . CARDIAC CATHETERIZATION    . CHOLECYSTECTOMY    . COLONOSCOPY     30 + years ago, unsure of where  . EYE SURGERY    . LASIK Bilateral   . MELANOMA EXCISION    . NASAL SEPTUM SURGERY    . TUBAL LIGATION     Social History   Social History Narrative   Lives alone in a one story home.  Has 2 living children.  Retired Engineer, water.     Immunization History  Administered Date(s) Administered  . Influenza, High Dose Seasonal PF 08/16/2016, 12/12/2017, 09/22/2018  . Influenza,inj,Quad PF,6+ Mos 06/30/2019  . PFIZER SARS-COV-2 Vaccination 12/25/2019, 01/19/2020  . Pneumococcal Conjugate-13 09/01/2014  . Pneumococcal Polysaccharide-23 12/12/2017  . Td 11/29/2006     Objective: Vital Signs: BP 127/79 (BP Location: Left Arm, Patient Position: Sitting, Cuff Size: Normal)   Pulse 84   Resp 16    Ht 5' (1.524 m)   Wt 155 lb 6.4 oz (70.5 kg)   BMI 30.35 kg/m    Physical Exam Vitals and nursing note reviewed.  Constitutional:      Appearance: She is well-developed.  HENT:     Head: Normocephalic and atraumatic.  Eyes:     Conjunctiva/sclera: Conjunctivae normal.  Pulmonary:     Effort: Pulmonary effort is normal.  Abdominal:     General: Bowel sounds are normal.     Palpations: Abdomen is soft.  Musculoskeletal:     Cervical back: Normal range of motion.  Lymphadenopathy:     Cervical: No cervical adenopathy.  Skin:    General: Skin is warm and dry.     Capillary Refill: Capillary refill takes less than 2 seconds.  Neurological:  Mental Status: She is alert and oriented to person, place, and time.  Psychiatric:        Behavior: Behavior normal.      Musculoskeletal Exam: C-spine good ROM.  Shoulder joints good ROM with no discomfort.  Elbow joints and wrist joints good ROM with no synovitis. [place] joint prominence bilaterally.  MCP, PIP, and DIP thickening but no synovitis.  Hip joints good ROM.  Left knee has good ROM with no warmth or effusion.  Right knee warmth.  Ankle joints good ROM with no tenderness or inflammation.   CDAI Exam: CDAI Score: 3.3  Patient Global: 8 mm; Provider Global: 5 mm Swollen: 1 ; Tender: 1  Joint Exam 03/10/2020      Right  Left  Knee  Swollen Tender        Investigation: No additional findings.  Imaging: No results found.  Recent Labs: Lab Results  Component Value Date   WBC 2.1 (L) 02/26/2020   HGB 11.5 (L) 02/26/2020   PLT 244 02/26/2020   NA 136 02/26/2020   K 3.6 02/26/2020   CL 99 02/26/2020   CO2 26 02/26/2020   GLUCOSE 84 02/26/2020   BUN 15 02/26/2020   CREATININE 0.74 02/26/2020   BILITOT 0.6 02/26/2020   ALKPHOS 58 11/30/2019   AST 22 02/26/2020   ALT 20 02/26/2020   PROT 6.0 (L) 02/26/2020   ALBUMIN 3.5 11/30/2019   CALCIUM 9.0 02/26/2020   GFRAA 91 02/26/2020    Speciality Comments: No  specialty comments available.  Procedures:  No procedures performed Allergies: Tramadol and Statins   Assessment / Plan:     Visit Diagnoses: Rheumatoid arthritis involving multiple sites with positive rheumatoid factor ([place]): She presents today with warmth and inflammation in the right knee joint.  She has been having severe pain and difficulty walking due to the discomfort.  She had a cortisone injection performed on 01/01/2020 which provided temporary relief.  She also recently completed a prednisone taper starting at 20 mg tapering by 5 mg every 2 days.  Since it is too soon for repeat cortisone injection and new prednisone taper starting 20 mg tapering by 5 mg every week was sent to the pharmacy.  She is currently holding methotrexate and Arava due to low white blood cell count.  Her white blood cell count was 2.1 on 02/26/2020.  She was advised to reach out to her dermatologist for clearance to restart on an anti-TNF inhibitor since she has a history of melanoma.  We will further discuss treatment options at her upcoming appointment.  High risk medications (not anticoagulants) long-term use -She is currently holding methotrexate and Areva due to low white blood cell count.  Her white blood cell count was 2.1 on 02/26/2020.  She will be updating lab work next week.  Standing orders for CBC and CMP were placed today.  Plan: CBC with Differential/Platelet, COMPLETE METABOLIC PANEL WITH GFR  Primary osteoarthritis of both hands: She has PIP and DIP thickening consistent with osteoarthritis of both hands.  She has [place] thickening consistent with osteoarthritis.  No tenderness or synovitis was noted.  Joint protection and muscle strengthening were discussed.  Primary osteoarthritis of both knees: She presents today with warmth in the right knee joint.  She has been having severe pain in the right knee and has had difficulty bearing weight due to the discomfort.  She requested a right knee joint cortisone  injection but it is too soon since she had an injection on  01/01/2020.  We will send in a prednisone taper starting at 20 mg tapering by 5 mg every week.  Primary osteoarthritis of both feet: She is having any discomfort in her feet at this time.  History of melanoma: She plans on reaching out to her dermatologist for clearance on an anti-TNF inhibitor in the future.  Other medical conditions are listed as follows:  History of macular degeneration  History of cholecystectomy  History of anemia  History of basal cell carcinoma  History of hypothyroidism  History of anxiety  History of depression  History of asthma  Essential hypertension  Aortic valve disorder  Orders: Orders Placed This Encounter  Procedures  . CBC with Differential/Platelet  . COMPLETE METABOLIC PANEL WITH GFR   Meds ordered this encounter  Medications  . predniSONE (DELTASONE) 5 MG tablet    Sig: Take 4 tablets by mouth once daily x1wk, 3 tablets by mouth daily x1wk, 2 tablets by mouth daily x1wk, 1 tablet by mouth daily x1wk.    Dispense:  70 tablet    Refill:  0     Follow-Up Instructions: Return for Rheumatoid arthritis.  As scheduled.  Hazel Sams , PA-C  I examined and evaluated the patient with Hazel Sams PA.  Patient continues to have pain and swelling in her right knee joint on my exam.  We will give her a prednisone taper.  Pain rating for labs to come back.  We also discussed she gets clearance from her dermatologist for anti-TNF use and we can start the medication.  The plan of care was discussed as noted above.  Bo Merino, MD   Note - This record has been created using Editor, commissioning.  Chart creation errors have been sought, but may not always  have been located. Such creation errors do not reflect on  the standard of medical care.

## 2020-03-14 ENCOUNTER — Other Ambulatory Visit: Payer: Self-pay | Admitting: *Deleted

## 2020-03-14 DIAGNOSIS — Z79899 Other long term (current) drug therapy: Secondary | ICD-10-CM

## 2020-03-14 LAB — CBC WITH DIFFERENTIAL/PLATELET
Absolute Monocytes: 446 cells/uL (ref 200–950)
Basophils Absolute: 22 cells/uL (ref 0–200)
Basophils Relative: 0.6 %
Eosinophils Absolute: 11 cells/uL — ABNORMAL LOW (ref 15–500)
Eosinophils Relative: 0.3 %
HCT: 36 % (ref 35.0–45.0)
Hemoglobin: 12.1 g/dL (ref 11.7–15.5)
Lymphs Abs: 1865 cells/uL (ref 850–3900)
MCH: 33.7 pg — ABNORMAL HIGH (ref 27.0–33.0)
MCHC: 33.6 g/dL (ref 32.0–36.0)
MCV: 100.3 fL — ABNORMAL HIGH (ref 80.0–100.0)
MPV: 9.9 fL (ref 7.5–12.5)
Monocytes Relative: 12.4 %
Neutro Abs: 1256 cells/uL — ABNORMAL LOW (ref 1500–7800)
Neutrophils Relative %: 34.9 %
Platelets: 221 10*3/uL (ref 140–400)
RBC: 3.59 10*6/uL — ABNORMAL LOW (ref 3.80–5.10)
RDW: 14.6 % (ref 11.0–15.0)
Total Lymphocyte: 51.8 %
WBC: 3.6 10*3/uL — ABNORMAL LOW (ref 3.8–10.8)

## 2020-03-15 NOTE — Progress Notes (Signed)
Her WBC count is better.  Patient was supposed to schedule an appointment with the dermatologist to discuss possible use of anti-TNF's like Enbrel or Humira.  Once approved by his the dermatologist then we can start on the medication.  We will need a letter from the dermatologist.

## 2020-03-16 ENCOUNTER — Encounter: Payer: Self-pay | Admitting: Physician Assistant

## 2020-03-16 ENCOUNTER — Ambulatory Visit (INDEPENDENT_AMBULATORY_CARE_PROVIDER_SITE_OTHER): Payer: Medicare Other | Admitting: Rheumatology

## 2020-03-16 ENCOUNTER — Other Ambulatory Visit: Payer: Self-pay

## 2020-03-16 ENCOUNTER — Telehealth: Payer: Self-pay

## 2020-03-16 VITALS — BP 116/84 | HR 92 | Resp 12 | Ht 60.0 in | Wt 153.6 lb

## 2020-03-16 DIAGNOSIS — M19071 Primary osteoarthritis, right ankle and foot: Secondary | ICD-10-CM

## 2020-03-16 DIAGNOSIS — Z85828 Personal history of other malignant neoplasm of skin: Secondary | ICD-10-CM

## 2020-03-16 DIAGNOSIS — M19042 Primary osteoarthritis, left hand: Secondary | ICD-10-CM

## 2020-03-16 DIAGNOSIS — Z8669 Personal history of other diseases of the nervous system and sense organs: Secondary | ICD-10-CM | POA: Diagnosis not present

## 2020-03-16 DIAGNOSIS — Z79899 Other long term (current) drug therapy: Secondary | ICD-10-CM | POA: Diagnosis not present

## 2020-03-16 DIAGNOSIS — Z862 Personal history of diseases of the blood and blood-forming organs and certain disorders involving the immune mechanism: Secondary | ICD-10-CM

## 2020-03-16 DIAGNOSIS — Z85820 Personal history of malignant melanoma of skin: Secondary | ICD-10-CM | POA: Diagnosis not present

## 2020-03-16 DIAGNOSIS — Z8709 Personal history of other diseases of the respiratory system: Secondary | ICD-10-CM

## 2020-03-16 DIAGNOSIS — Z9049 Acquired absence of other specified parts of digestive tract: Secondary | ICD-10-CM

## 2020-03-16 DIAGNOSIS — Z8679 Personal history of other diseases of the circulatory system: Secondary | ICD-10-CM | POA: Diagnosis not present

## 2020-03-16 DIAGNOSIS — Z8659 Personal history of other mental and behavioral disorders: Secondary | ICD-10-CM

## 2020-03-16 DIAGNOSIS — Z8639 Personal history of other endocrine, nutritional and metabolic disease: Secondary | ICD-10-CM

## 2020-03-16 DIAGNOSIS — M19041 Primary osteoarthritis, right hand: Secondary | ICD-10-CM

## 2020-03-16 DIAGNOSIS — M170 Bilateral primary osteoarthritis of knee: Secondary | ICD-10-CM

## 2020-03-16 DIAGNOSIS — M0579 Rheumatoid arthritis with rheumatoid factor of multiple sites without organ or systems involvement: Secondary | ICD-10-CM

## 2020-03-16 DIAGNOSIS — M19072 Primary osteoarthritis, left ankle and foot: Secondary | ICD-10-CM

## 2020-03-16 NOTE — Telephone Encounter (Signed)
Please apply for in office cimzia, per Dr. Estanislado Pandy. We are awaiting clearance from the dermatologist.

## 2020-03-17 NOTE — Telephone Encounter (Signed)
Initiated case in [employer] for In office Cimzia to check benefits.  Case [id number]

## 2020-03-18 ENCOUNTER — Telehealth: Payer: Self-pay

## 2020-03-18 NOTE — Telephone Encounter (Signed)
I called [facility] to follow up on clearance requested to start cimzia. Dr. Danelle Berry is awaiting notes from patient's previous dermatologist who treated the melanoma. Once she receives the notes and reviews, [facility] will contact us.

## 2020-03-18 NOTE — Telephone Encounter (Signed)
Submitted a Prior Authorization request to [facility] for [place] In office via Cover My Meds. Will update once we receive a response.  (Key: [id number])

## 2020-03-21 LAB — IGG, IGA, IGM
IgG (Immunoglobin G), Serum: 743 mg/dL (ref 600–1540)
IgM, Serum: 158 mg/dL (ref 50–300)
Immunoglobulin A: 81 mg/dL (ref 70–320)

## 2020-03-21 LAB — HEPATITIS B CORE ANTIBODY, IGM: Hep B C IgM: NONREACTIVE

## 2020-03-21 LAB — IFE INTERPRETATION: Immunofix Electr Int: NOT DETECTED

## 2020-03-21 LAB — PROTEIN ELECTROPHORESIS, SERUM, WITH REFLEX
Albumin ELP: 3.8 g/dL (ref 3.8–4.8)
Alpha 1: 0.4 g/dL — ABNORMAL HIGH (ref 0.2–0.3)
Alpha 2: 0.7 g/dL (ref 0.5–0.9)
Beta 2: 0.3 g/dL (ref 0.2–0.5)
Beta Globulin: 0.5 g/dL (ref 0.4–0.6)
Gamma Globulin: 0.7 g/dL — ABNORMAL LOW (ref 0.8–1.7)
Total Protein: 6.4 g/dL (ref 6.1–8.1)

## 2020-03-21 LAB — QUANTIFERON-TB GOLD PLUS
Mitogen-NIL: >10 IU/mL
NIL: 0.03 IU/mL
QuantiFERON-TB Gold Plus: NEGATIVE
TB1-NIL: 0 IU/mL
TB2-NIL: 0 IU/mL

## 2020-03-21 LAB — HEPATITIS B SURFACE ANTIGEN: Hepatitis B Surface Ag: NONREACTIVE

## 2020-03-21 LAB — HEPATITIS C ANTIBODY
Hepatitis C Ab: NONREACTIVE
SIGNAL TO CUT-OFF: 0.01 (ref <1.00)

## 2020-03-21 NOTE — Telephone Encounter (Signed)
Benefits from [employer]: Medicare covers 80% of cost. Secondary BCBS plan plan coordinates with Medicare. This plan does not follow Medicare guidelines. This plan covers 100% of the remaining Medicare Part B coinsurance. It also covers the Part B deductible.

## 2020-03-21 NOTE — Progress Notes (Signed)
TB gold negative.  Hep B and C negative.  Immunoglobulins WNL.  IFE did not reveal any monoclonal proteins.

## 2020-03-21 NOTE — Telephone Encounter (Signed)
Received notification from [facility] regarding a prior authorization for [facility]. Authorization has been APPROVED from 03/18/20 to 03/17/2021.   Authorization # [id number]  Clearance from dermatologist still pending.   Mariella Saa, PharmD, [place], CPP Clinical Specialty Pharmacist (Rheumatology and Pulmonology)  03/21/2020 8:59 AM

## 2020-03-23 NOTE — Telephone Encounter (Signed)
Please notify patient that an office Cimzia has been approved. We are waiting for clearance from her dermatologist. Once we get the clearance we can start her on the medication.

## 2020-03-23 NOTE — Telephone Encounter (Signed)
Patient advised that in office Cimzia has been approved. Patient advised we are waiting for clearance from her dermatologist. Patient advisedonce we get the clearance we can start her on the medication. Patient states she will contact the dermatologist office.

## 2020-03-23 NOTE — Telephone Encounter (Signed)
Patient approved for in office Cimzia. All baseline labs within normal limits.  We are still waiting for clearance from dermatology.  Once we receive clearance she can be scheduled to receive her first dose.   Mariella Saa, PharmD, [place], CPP Clinical Specialty Pharmacist (Rheumatology and Pulmonology)  03/23/2020 9:32 AM

## 2020-03-30 ENCOUNTER — Telehealth: Payer: Self-pay | Admitting: Rheumatology

## 2020-03-30 NOTE — Telephone Encounter (Signed)
Noted  

## 2020-03-30 NOTE — Telephone Encounter (Signed)
FYI:  Patient called stating [facility] just faxed 8 pages of her pathology report to [facility].  Patient states her doctor will review the results and will call our office.

## 2020-04-07 ENCOUNTER — Telehealth: Payer: Self-pay | Admitting: *Deleted

## 2020-04-07 NOTE — Telephone Encounter (Signed)
Patient advised we have received clearance letter from her dermatologist. Patient advised we are now able to start her on Cimzia. Patient is currently on antibiotics due to upper respiratory infection. Patient advised to contact the office when she has completed the antibiotics and is feeling well.Marland Kitchen

## 2020-04-07 NOTE — Telephone Encounter (Signed)
Noted! Thank you

## 2020-04-10 NOTE — Progress Notes (Signed)
Katherine Wang at [facility] [place], [place], [place] [address] [phone]  Date:  04/11/2020   Name:  Katherine Wang   DOB:  Feb 22, 1946   MRN:  [id number]  PCP:  Katherine Mclean, MD    Chief Complaint: Chest congestion   History of Present Illness:  Katherine Wang is a 74 y.o. very pleasant female patient who presents with the following: Virtual visit today for illness- video Pt location is home, provider is at office Pt ID confirmed with 2 factors, she gives consent for virtual visit today The pt and myself are present on the call today  However, at the end of call we decided to have the patient come in the office for an in person visit at 3 PM today  Pt with history of HTN, RA, hypothyroidism, melanoma Last seen by myself in April with c/o cough- treated with doxycycline Pt has completed her covid 19 series  Most recent visit with her rheumatologist was last month- Dr Katherine Wang They want to start her on a new rheumatologic agent- Cimzia- for her RA  Katherine Wang notes that she continued to cough after she finished up her doxy in April- she also took a few more days of doxy that she had on hand at home and still continues to cough  She notes that she is coughing and bringing up clear phlegm She also may wheeze with exercise or sometimes at rest  The cough is worst first thing in the am She did pick up and use her albuterol a few times the last couple of weeks   She has noted ankle swelling as well for the last 2 weeks- this will come and go, not swollen today  This tends to worsen as the day goes on Her weight has gone up some, but she is eating more  She worries that she could have heart failure, which is a contraindication with Cimzia  She did have an echo in 07/2019 which was normal 1. Left ventricular ejection fraction, by visual estimation, is 60 to  65%. The left ventricle has normal function. Normal left ventricular  size.  There is no left ventricular hypertrophy.  On arrival in the clinic today, she also has concern of shortness of breath and chest pain She notes shortness of breath for about 3 months, if not longer She has also noted chest pain 3x in the last week The CP may last 1 minute or so This does not occur with exertion- has generally occurred when she was laying down  Not having any chest pain now -last episode about 3 days ago  We went over her CT angiogram and follow-up CT chest from last summer CT angiogram dated June 6, CT chest follow-up dated August 19 I explained to patient that the diagnosis of pneumonia was not definite based on her for CT scan.  The second CT scan also mentions possibility of interstitial lung disease, recommends follow-up CT in 1 year if symptoms persist  Patient Active Problem List   Diagnosis Date Noted  . Chest pain 04/04/2019  . Anxiety 04/04/2019  . Leukopenia 04/04/2019  . Pneumonia 04/04/2019  . Dyspnea on exertion   . Moderate aortic regurgitation 11/02/2017  . Lightheadedness 11/02/2017  . Tachycardia 07/26/2017  . Atypical chest pain 07/26/2017  . High risk medications (not anticoagulants) long-term use 01/16/2017  . Baker's cyst of knee, left 12/19/2016  . Pain in both knees 12/19/2016  .  Pain of both elbows 12/19/2016  . Malignant melanoma ([place]) 12/19/2016  . Basal cell carcinoma 12/19/2016  . Chronic pansinusitis 08/15/2016  . Deviated septum 08/15/2016  . Epistaxis 08/15/2016  . Hypertrophy, nasal, turbinate 08/15/2016  . Upper airway cough syndrome 04/25/2016  . Hx of adenomatous colonic polyps 06/21/2015  . Hyperlipidemia 11/15/2014  . Rheumatoid arthritis ([place]) 09/01/2014  . Pericarditis 04/24/2011  . Aortic valve disorder 02/14/2009  . ELECTROCARDIOGRAM, ABNORMAL 02/14/2009  . PERNICIOUS ANEMIA 07/10/2007  . DEPRESSION 07/03/2007  . MALAISE AND FATIGUE 07/03/2007  . Hypothyroidism 04/30/2007  . Essential hypertension  04/30/2007  . Aneurysm of thoracic aorta ([place]) 02/13/2006    Past Medical History:  Diagnosis Date  . Adenomatous colon polyp 2012  . Allergy   . Anxiety   . Aortic insufficiency   . Arthritis   . Basal cell carcinoma   . Cataract   . Depression   . Diverticulosis   . Fibromyalgia   . Fibromyalgia   . Hyperlipidemia   . Hypertension   . Macular degeneration   . Melanoma ([place])   . Murmur, heart   . Reflux   . Stroke ([place]) 20 years ago   per pt. mild stroke  . Thyroid disease    hypothroidism     Past Surgical History:  Procedure Laterality Date  . ABDOMINAL HYSTERECTOMY    . APPENDECTOMY    . BASAL CELL CARCINOMA EXCISION     nose   . CARDIAC CATHETERIZATION    . CHOLECYSTECTOMY    . COLONOSCOPY     30 + years ago, unsure of where  . EYE SURGERY    . LASIK Bilateral   . MELANOMA EXCISION    . NASAL SEPTUM SURGERY    . TUBAL LIGATION      Social History   Tobacco Use  . Smoking status: Former Smoker    Packs/day: 2.00    Years: 20.00    Pack years: 40.00    Types: Cigarettes    Quit date: 10/30/1983    Years since quitting: 36.4  . Smokeless tobacco: Never Used  Vaping Use  . Vaping Use: Never used  Substance Use Topics  . Alcohol use: No    Alcohol/week: 0.0 standard drinks  . Drug use: Never    Family History  Problem Relation Age of Onset  . Heart disease Mother   . Hypertension Mother   . Mental illness Mother   . Allergies Mother   . Heart disease Father   . Hypertension Father   . Mental illness Father   . Emphysema Father        smoked  . Allergies Father   . Heart attack Father   . Hyperlipidemia Sister   . Hypertension Sister   . Allergies Sister   . Cancer Sister        oral  . Hypertension Sister   . Diabetes Sister   . Allergies Sister   . Epilepsy Son   . Aneurysm Son   . Hyperthyroidism Daughter   . Hypothyroidism Daughter   . Hypertension Daughter   . Colon cancer Neg Hx   . Esophageal cancer Neg Hx   . Rectal  cancer Neg Hx   . Stomach cancer Neg Hx   . Pancreatic cancer Neg Hx   . Prostate cancer Neg Hx     Allergies  Allergen Reactions  . Tramadol Other (See Comments)    dizzy  . Statins     Body ache  Medication list has been reviewed and updated.  Current Outpatient Medications on File Prior to Visit  Medication Sig Dispense Refill  . amLODipine (NORVASC) 10 MG tablet Take 1 tablet (10 mg total) by mouth daily. At bedtime 90 tablet 3  . aspirin EC 81 MG tablet Take 1 tablet (81 mg total) by mouth daily. 90 tablet 3  . carvedilol (COREG) 3.125 MG tablet Take 1 tablet (3.125 mg total) by mouth 2 (two) times daily with a meal. 180 tablet 3  . cholecalciferol (VITAMIN D3) 25 MCG (1000 UT) tablet Take 2,000 Units by mouth daily.    . clonazePAM (KLONOPIN) 0.5 MG tablet TAKE 1 TABLET BY MOUTH AT BEDTIME. MAY TAKE 2 TIMES DAILY AS NEEDED ON OCCASION 60 tablet 2  . Cyanocobalamin (VITAMIN B 12 PO) Take 1 tablet by mouth daily.    . cycloSPORINE (RESTASIS) 0.05 % ophthalmic emulsion Place 1 drop into both eyes 2 (two) times daily.     . famotidine (PEPCID) 20 MG tablet Take 1 tablet (20 mg total) by mouth daily. 30 tablet 5  . folic acid (FOLVITE) 1 MG tablet Take 2 mg by mouth daily.    . hydrochlorothiazide (HYDRODIURIL) 12.5 MG tablet Take 1 Tablet Daily 30 tablet 3  . ibuprofen (ADVIL,MOTRIN) 200 MG tablet Take 600 mg by mouth 2 (two) times daily as needed for mild pain.    Marland Kitchen irbesartan (AVAPRO) 300 MG tablet Take 1 tablet (300 mg total) by mouth daily. 30 tablet 11  . levothyroxine (SYNTHROID) 112 MCG tablet Take 1 tablet (112 mcg total) by mouth daily. 90 tablet 1  . Multiple Vitamins-Minerals (PRESERVISION AREDS) TABS Take 1 tablet by mouth 2 (two) times daily.    . predniSONE (DELTASONE) 5 MG tablet Take 4 tablets by mouth once daily x1wk, 3 tablets by mouth daily x1wk, 2 tablets by mouth daily x1wk, 1 tablet by mouth daily x1wk. 70 tablet 0  . rosuvastatin (CRESTOR) 10 MG tablet Take  1 tablet (10 mg total) by mouth daily. 30 tablet 5  . sertraline (ZOLOFT) 100 MG tablet TAKE 2 TABLETS(200 MG) BY MOUTH DAILY 180 tablet 1  . ezetimibe (ZETIA) 10 MG tablet Take 1 tablet (10 mg total) by mouth daily. (Patient not taking: Reported on 03/10/2020) 30 tablet 1  . leflunomide (ARAVA) 10 MG tablet Take 1 tablet (10 mg total) by mouth daily. (Patient not taking: Reported on 03/10/2020) 30 tablet 0  . methotrexate 50 MG/2ML injection ADMINISTER 0.5 ML UNDER THE SKIN 1 TIME A WEEK (Patient not taking: Reported on 03/10/2020) 6 mL 0  . TUBERCULIN SYR 1CC/27GX1/2" (SAFETY-LOK TB SYRINGE 27GX.5") 27G X 1/2" 1 ML MISC USE AS DIRECTED (Patient not taking: Reported on 03/16/2020) 12 each 3   No current facility-administered medications on file prior to visit.    Review of Systems:  As per HPI- otherwise negative.   Physical Examination: Vitals:   04/11/20 1502  BP: 128/80  Pulse: 87  Resp: 19  Temp: 98.4 F (36.9 C)  SpO2: 97%   Vitals:   04/11/20 1502  Weight: 156 lb (70.8 kg)  Height: 5' (1.524 m)   Body mass index is 30.47 kg/m. Ideal Body Weight: Weight in (lb) to have BMI = 25: 127.7  GEN: no acute distress.  Mild overweight, looks well and her normal self HEENT: Atraumatic, Normocephalic.  Ears and Nose: No external deformity. CV: RRR, No M/G/R. No JVD. No thrill. No extra heart sounds. PULM: CTA B, no wheezes, crackles, rhonchi.  No retractions. No resp. distress. No accessory muscle use. ABD: S, NT, ND, +BS. No rebound. No HSM.  Belly is benign EXTR: No c/c/e.  No significant lower extremity edema is noted PSYCH: Normally interactive. Conversant.   DG Chest 2 View  Result Date: 04/11/2020 CLINICAL DATA:  Cough for several months. Previous COVID vaccination. EXAM: CHEST - 2 VIEW COMPARISON:  Radiographs 08/03/2019 and 04/04/2019. Cardiac CT 08/27/2019 and chest CT 06/17/2019. FINDINGS: The heart size and mediastinal contours are stable with aortic atherosclerosis and  tortuosity. The lungs appear clear. There is no pleural effusion or pneumothorax. There is a thoracolumbar scoliosis without acute osseous findings. IMPRESSION: Stable chest.  No active cardiopulmonary process. Electronically Signed   By: Richardean Sale M.D.   On: 04/11/2020 15:03   EKG shows sinus rhythm with diffuse ST depression, compared with previous EKG from October 2020 no acute changes noted.  Her EKG was also evaluated by Dr Haroldine Laws who agrees there is no change or acute finding in her current EKG Assessment and Plan: Chest pain, unspecified type - Plan: DG Chest 2 View, EKG 12-Lead  Chest congestion - Plan: albuterol (VENTOLIN HFA) 108 (90 Base) MCG/ACT inhaler, DG Chest 2 View  SOB (shortness of breath) - Plan: B Nat Peptide, Basic metabolic panel, D-Dimer, Quantitative, CANCELED: Basic metabolic panel, CANCELED: D-Dimer, Quantitative, CANCELED: B Nat Peptide  Cough  Virtual visit which we converted to in person visit today for concern of chronic cough for several months, chest congestion, and also shortness of breath and atypical chest pain Branna is worried that she may have "pneumonia again."  I explained that this is unlikely to cause cough or several months, chest x-ray today is also clear.  I explained that her CT angiogram from June 2020 does not definitely diagnose pneumonia either.  She may have some element of interstitial lung disease.  Would recommend repeat CT chest at this time  Heart failure also seems unlikely, but will check BNP today D-dimer pending to help [place] determine if CT angiogram or regular CT chest is appropriate-we will order CT for her on receipt of D-dimer  And has noted some very atypical chest pain.  She does not have chest pain now, no change in her EKG.  She will let us know if this changes or worsens  This visit occurred during the SARS-CoV-2 public health emergency.  Safety protocols were in place, including screening questions prior to the visit,  additional usage of staff PPE, and extensive cleaning of exam room while observing appropriate contact time as indicated for disinfecting solutions.   I spent over 45 minutes in face-to-face care with patient today  Signed Lamar Blinks, MD

## 2020-04-11 ENCOUNTER — Encounter: Payer: Self-pay | Admitting: Family Medicine

## 2020-04-11 ENCOUNTER — Other Ambulatory Visit: Payer: Self-pay

## 2020-04-11 ENCOUNTER — Telehealth (INDEPENDENT_AMBULATORY_CARE_PROVIDER_SITE_OTHER): Payer: Medicare Other | Admitting: Family Medicine

## 2020-04-11 ENCOUNTER — Telehealth (HOSPITAL_COMMUNITY): Payer: Self-pay | Admitting: Cardiology

## 2020-04-11 ENCOUNTER — Ambulatory Visit (HOSPITAL_BASED_OUTPATIENT_CLINIC_OR_DEPARTMENT_OTHER)
Admission: RE | Admit: 2020-04-11 | Discharge: 2020-04-11 | Disposition: A | Discharge status: Home / Self Care | Payer: Medicare Other | Source: Ambulatory Visit | Attending: Family Medicine | Admitting: Family Medicine

## 2020-04-11 VITALS — BP 128/80 | HR 87 | Temp 98.4°F | Resp 19 | Ht 60.0 in | Wt 156.0 lb

## 2020-04-11 DIAGNOSIS — R059 Cough, unspecified: Secondary | ICD-10-CM

## 2020-04-11 DIAGNOSIS — R079 Chest pain, unspecified: Secondary | ICD-10-CM | POA: Diagnosis not present

## 2020-04-11 DIAGNOSIS — R0602 Shortness of breath: Secondary | ICD-10-CM

## 2020-04-11 DIAGNOSIS — R05 Cough: Secondary | ICD-10-CM

## 2020-04-11 DIAGNOSIS — R0989 Other specified symptoms and signs involving the circulatory and respiratory systems: Secondary | ICD-10-CM

## 2020-04-11 MED ORDER — ALBUTEROL SULFATE HFA 108 (90 BASE) MCG/ACT IN AERS: 2.0000 | INHALATION_SPRAY | Freq: Four times a day (QID) | RESPIRATORY_TRACT | 2 refills | Status: AC | PRN

## 2020-04-11 NOTE — Patient Instructions (Addendum)
It was good to see you today- we will be in touch with your labs I am going a BNP to make sure no evidence of heart failure We will also plan to get a CT scan of your chest in case you may have some sort of chronic lung scarring or stiffening

## 2020-04-11 NOTE — Telephone Encounter (Signed)
Dr Edilia Bo with primary care called with a request for a provider to review ekg done in office today. Reported atypical non exertional CP, would like someone a provider to review EKG.  Above reviewed with Amy Clegg,NP, per VO Patient should report to ER for CP   Orders/recommedations given to Dr Edilia Bo and states the chest pains were earlier in the week, would like to speak with a provider directly. On call pager number given [phone]. Nothing further needed at this time.

## 2020-04-12 ENCOUNTER — Other Ambulatory Visit (INDEPENDENT_AMBULATORY_CARE_PROVIDER_SITE_OTHER): Payer: Medicare Other

## 2020-04-12 ENCOUNTER — Telehealth: Payer: Self-pay | Admitting: Family Medicine

## 2020-04-12 DIAGNOSIS — R0602 Shortness of breath: Secondary | ICD-10-CM

## 2020-04-12 LAB — D-DIMER, QUANTITATIVE: D-Dimer, Quant: 2.47 mcg/mL FEU — ABNORMAL HIGH (ref <0.50)

## 2020-04-12 LAB — BRAIN NATRIURETIC PEPTIDE: Pro B Natriuretic peptide (BNP): 43 pg/mL (ref 0.0–100.0)

## 2020-04-12 LAB — BASIC METABOLIC PANEL
BUN: 11 mg/dL (ref 6–23)
CO2: 28 mEq/L (ref 19–32)
Calcium: 9.3 mg/dL (ref 8.4–10.5)
Chloride: 99 mEq/L (ref 96–112)
Creatinine, Ser: 0.71 mg/dL (ref 0.40–1.20)
GFR: 80 mL/min (ref >60.00)
Glucose, Bld: 150 mg/dL — ABNORMAL HIGH (ref 70–99)
Potassium: 3.4 mEq/L — ABNORMAL LOW (ref 3.5–5.1)
Sodium: 134 mEq/L — ABNORMAL LOW (ref 135–145)

## 2020-04-12 NOTE — Telephone Encounter (Signed)
Caller Rhonda Linan Call Back # [phone]  After Summary Visit   Patient states that there was a mis communication between her and provider, patient states her cough has been going on about 1 month not 3 months.   Patient states that she would like to discuss CT with provider before she schedules it.

## 2020-04-13 ENCOUNTER — Telehealth: Payer: Self-pay | Admitting: Family Medicine

## 2020-04-13 ENCOUNTER — Encounter (HOSPITAL_BASED_OUTPATIENT_CLINIC_OR_DEPARTMENT_OTHER): Payer: Self-pay

## 2020-04-13 ENCOUNTER — Ambulatory Visit (HOSPITAL_BASED_OUTPATIENT_CLINIC_OR_DEPARTMENT_OTHER)
Admission: RE | Admit: 2020-04-13 | Discharge: 2020-04-13 | Disposition: A | Discharge status: Home / Self Care | Payer: Medicare Other | Source: Ambulatory Visit | Attending: Family Medicine | Admitting: Family Medicine

## 2020-04-13 ENCOUNTER — Other Ambulatory Visit: Payer: Self-pay

## 2020-04-13 DIAGNOSIS — R059 Cough, unspecified: Secondary | ICD-10-CM

## 2020-04-13 DIAGNOSIS — R0602 Shortness of breath: Secondary | ICD-10-CM | POA: Insufficient documentation

## 2020-04-13 MED ORDER — IOHEXOL 350 MG/ML SOLN
100.0000 mL | Freq: Once | INTRAVENOUS | Status: AC | PRN
  Administered 2020-04-13: 100 mL via INTRAVENOUS

## 2020-04-13 NOTE — Progress Notes (Signed)
Received her D dimer which is elevated Called radiology to ask about ILD protocol vs CT angiogram

## 2020-04-13 NOTE — Telephone Encounter (Signed)
Received her CT angiogram as below  CT Angio Chest W/Cm &/Or Wo Cm  Result Date: 04/13/2020 CLINICAL DATA:  Shortness of breath with positive D-dimer EXAM: CT ANGIOGRAPHY CHEST WITH CONTRAST TECHNIQUE: Multidetector CT imaging of the chest was performed using the standard protocol during bolus administration of intravenous contrast. Multiplanar CT image reconstructions and MIPs were obtained to evaluate the vascular anatomy. CONTRAST:  157mL OMNIPAQUE IOHEXOL 350 MG/ML SOLN COMPARISON:  Chest CT June 17, 2019; chest radiograph November 12, 2019 FINDINGS: Cardiovascular: There is no demonstrable pulmonary embolus. The ascending thoracic aortic diameter measures 4.0 x 4.0 cm. Measured diameter in the aortic arch measures 3.1 cm. No evident thoracic aortic dissection. There are foci of aortic atherosclerosis. Aorta is tortuous. There are foci of calcification in the visualized great vessels, most notably in the right innominate artery. There are foci of coronary artery calcification. No pericardial effusion or pericardial thickening is evident. Mediastinum/Nodes: No thyroid lesions evident. No evident thoracic adenopathy. There is a small hiatal hernia. Lungs/Pleura: There is no appreciable edema or airspace opacity. There is mild bibasilar atelectasis. No pleural effusions are evident. Upper Abdomen: Gallbladder is absent. There is upper abdominal aortic atherosclerosis. Visualized upper abdominal structures otherwise appear unremarkable. Musculoskeletal: There is degenerative change in the thoracic spine. There is thoracic dextroscoliosis. There are no blastic or lytic bone lesions. No chest wall lesions are evident. Review of the MIP images confirms the above findings. IMPRESSION: 1.  No demonstrable pulmonary embolus. 2. Ascending thoracic aortic diameter measures 4.0 x 4.0 cm. No evident dissection. Aorta is tortuous with aortic atherosclerosis. Recommend annual imaging followup by CTA or MRA. This  recommendation follows 2010 ACCF/AHA/AATS/ACR/ASA/SCA/SCAI/SIR/STS/SVM Guidelines for the Diagnosis and Management of Patients with Thoracic Aortic Disease. Circulation. 2010; 121: K093-G182. Aortic aneurysm NOS (ICD10-I71.9). There is aortic atherosclerosis as well as foci of great vessel and coronary artery calcification. 3. Mild bibasilar atelectasis. No edema or airspace opacity. No evident pleural effusions. 4.  No evident adenopathy. 5.  Small hiatal hernia. 6.  Absent gallbladder. Aortic Atherosclerosis (ICD10-I70.0). Electronically Signed   By: Lowella Grip III M.D.   On: 04/13/2020 13:51    Called patient to discuss her CT findings  She notes that DR Aundra Dubin is already aware of her thoracic aorta issue and is following this for her.  Compared with last CTA change of 0.1 cm diameter No PE She would like to consult with pulmonology which is a reasonable next step. Will make referal for her

## 2020-04-15 ENCOUNTER — Telehealth: Payer: Self-pay | Admitting: *Deleted

## 2020-04-15 ENCOUNTER — Other Ambulatory Visit: Payer: Self-pay | Admitting: Rheumatology

## 2020-04-15 MED ORDER — PREDNISONE 5 MG PO TABS: ORAL_TABLET | ORAL | 0 refills | Status: DC

## 2020-04-15 NOTE — Telephone Encounter (Signed)
Patient states she is having swelling in her right knee. Patient states she is having trouble walking. Patient is also having pain in her hands and her right  Shoulder. Patient states she is achy all over. Patient has a follow up appointment on 04/27/2020. Patient has been cleared by the dermatologist to start Cimzia. Patient states she has had a cough and what she thought was an upper respiratory infection. Patient was evaluated and treated by her PCP with antibiotics. Patient completed them but is still having the cough and has had further testing, chest x-ray and CT of her chest. Patient is delaying starting Cimzia until they figure out what is causing the cough. Patient is requesting a prescription for Prednisone.

## 2020-04-15 NOTE — Telephone Encounter (Signed)
Patient advised prescription for prednisone will be sent to the pharmacy. Patient states he would like the prescription sent to [facility] [address].

## 2020-04-15 NOTE — Telephone Encounter (Signed)
Okay to send prednisone 5 mg tablets starting at 20 mg and taper by 5 mg every 4 days.

## 2020-04-22 NOTE — Progress Notes (Deleted)
Office Visit Note  Patient: Katherine Wang             Date of Birth: 04-11-1946           MRN: [id number]             PCP: [name], MD Referring: [name], MD Visit Date: 04/27/2020 Occupation: @GUAROCC @  Subjective:  No chief complaint on file.   History of Present Illness: Katherine Wang is a 74 y.o. female ***   Activities of Daily Living:  Patient reports morning stiffness for *** {minute/hour:19697}.   Patient {ACTIONS;DENIES/REPORTS:21021675::"Denies"} nocturnal pain.  Difficulty dressing/grooming: {ACTIONS;DENIES/REPORTS:21021675::"Denies"} Difficulty climbing stairs: {ACTIONS;DENIES/REPORTS:21021675::"Denies"} Difficulty getting out of chair: {ACTIONS;DENIES/REPORTS:21021675::"Denies"} Difficulty using hands for taps, buttons, cutlery, and/or writing: {ACTIONS;DENIES/REPORTS:21021675::"Denies"}  No Rheumatology ROS completed.   PMFS History:  Patient Active Problem List   Diagnosis Date Noted  . Chest pain 04/04/2019  . Anxiety 04/04/2019  . Leukopenia 04/04/2019  . Pneumonia 04/04/2019  . Dyspnea on exertion   . Moderate aortic regurgitation 11/02/2017  . Lightheadedness 11/02/2017  . Tachycardia 07/26/2017  . Atypical chest pain 07/26/2017  . High risk medications (not anticoagulants) long-term use 01/16/2017  . Baker's cyst of knee, left 12/19/2016  . Pain in both knees 12/19/2016  . Pain of both elbows 12/19/2016  . Malignant melanoma ([place]) 12/19/2016  . Basal cell carcinoma 12/19/2016  . Chronic pansinusitis 08/15/2016  . Deviated septum 08/15/2016  . Epistaxis 08/15/2016  . Hypertrophy, nasal, turbinate 08/15/2016  . Upper airway cough syndrome 04/25/2016  . Hx of adenomatous colonic polyps 06/21/2015  . Hyperlipidemia 11/15/2014  . Rheumatoid arthritis ([place]) 09/01/2014  . Pericarditis 04/24/2011  . Aortic valve disorder 02/14/2009  . ELECTROCARDIOGRAM, ABNORMAL 02/14/2009  . PERNICIOUS ANEMIA 07/10/2007  . DEPRESSION  07/03/2007  . MALAISE AND FATIGUE 07/03/2007  . Hypothyroidism 04/30/2007  . Essential hypertension 04/30/2007  . Aneurysm of thoracic aorta ([place]) 02/13/2006    Past Medical History:  Diagnosis Date  . Adenomatous colon polyp 2012  . Allergy   . Anxiety   . Aortic insufficiency   . Arthritis   . Basal cell carcinoma   . Cataract   . Depression   . Diverticulosis   . Fibromyalgia   . Fibromyalgia   . Hyperlipidemia   . Hypertension   . Macular degeneration   . Melanoma ([place])   . Murmur, heart   . Reflux   . Stroke ([place]) 20 years ago   per pt. mild stroke  . Thyroid disease    hypothroidism     Family History  Problem Relation Age of Onset  . Heart disease Mother   . Hypertension Mother   . Mental illness Mother   . Allergies Mother   . Heart disease Father   . Hypertension Father   . Mental illness Father   . Emphysema Father        smoked  . Allergies Father   . Heart attack Father   . Hyperlipidemia Sister   . Hypertension Sister   . Allergies Sister   . Cancer Sister        oral  . Hypertension Sister   . Diabetes Sister   . Allergies Sister   . Epilepsy Son   . Aneurysm Son   . Hyperthyroidism Daughter   . Hypothyroidism Daughter   . Hypertension Daughter   . Colon cancer Neg Hx   . Esophageal cancer Neg Hx   . Rectal cancer Neg Hx   .  Stomach cancer Neg Hx   . Pancreatic cancer Neg Hx   . Prostate cancer Neg Hx    Past Surgical History:  Procedure Laterality Date  . ABDOMINAL HYSTERECTOMY    . APPENDECTOMY    . BASAL CELL CARCINOMA EXCISION     nose   . CARDIAC CATHETERIZATION    . CHOLECYSTECTOMY    . COLONOSCOPY     30 + years ago, unsure of where  . EYE SURGERY    . LASIK Bilateral   . MELANOMA EXCISION    . NASAL SEPTUM SURGERY    . TUBAL LIGATION     Social History   Social History Narrative   Lives alone in a one story home.  Has 2 living children.  Retired Engineer, water.     Immunization History  Administered Date(s)  Administered  . Influenza, High Dose Seasonal PF 08/16/2016, 12/12/2017, 09/22/2018  . Influenza,inj,Quad PF,6+ Mos 06/30/2019  . PFIZER SARS-COV-2 Vaccination 12/25/2019, 01/19/2020  . Pneumococcal Conjugate-13 09/01/2014  . Pneumococcal Polysaccharide-23 12/12/2017  . Td 11/29/2006     Objective: Vital Signs: There were no vitals taken for this visit.   Physical Exam   Musculoskeletal Exam: ***  CDAI Exam: CDAI Score: -- Patient Global: --; Provider Global: -- Swollen: --; Tender: -- Joint Exam 04/27/2020   No joint exam has been documented for this visit   There is currently no information documented on the homunculus. Go to the Rheumatology activity and complete the homunculus joint exam.  Investigation: No additional findings.  Imaging: DG Chest 2 View  Result Date: 04/11/2020 CLINICAL DATA:  Cough for several months. Previous COVID vaccination. EXAM: CHEST - 2 VIEW COMPARISON:  Radiographs 08/03/2019 and 04/04/2019. Cardiac CT 08/27/2019 and chest CT 06/17/2019. FINDINGS: The heart size and mediastinal contours are stable with aortic atherosclerosis and tortuosity. The lungs appear clear. There is no pleural effusion or pneumothorax. There is a thoracolumbar scoliosis without acute osseous findings. IMPRESSION: Stable chest.  No active cardiopulmonary process. Electronically Signed   By: Richardean Sale M.D.   On: 04/11/2020 15:03   CT Angio Chest W/Cm &/Or Wo Cm  Result Date: 04/13/2020 CLINICAL DATA:  Shortness of breath with positive D-dimer EXAM: CT ANGIOGRAPHY CHEST WITH CONTRAST TECHNIQUE: Multidetector CT imaging of the chest was performed using the standard protocol during bolus administration of intravenous contrast. Multiplanar CT image reconstructions and MIPs were obtained to evaluate the vascular anatomy. CONTRAST:  163mL OMNIPAQUE IOHEXOL 350 MG/ML SOLN COMPARISON:  Chest CT June 17, 2019; chest radiograph November 12, 2019 FINDINGS: Cardiovascular: There is  no demonstrable pulmonary embolus. The ascending thoracic aortic diameter measures 4.0 x 4.0 cm. Measured diameter in the aortic arch measures 3.1 cm. No evident thoracic aortic dissection. There are foci of aortic atherosclerosis. Aorta is tortuous. There are foci of calcification in the visualized great vessels, most notably in the right innominate artery. There are foci of coronary artery calcification. No pericardial effusion or pericardial thickening is evident. Mediastinum/Nodes: No thyroid lesions evident. No evident thoracic adenopathy. There is a small hiatal hernia. Lungs/Pleura: There is no appreciable edema or airspace opacity. There is mild bibasilar atelectasis. No pleural effusions are evident. Upper Abdomen: Gallbladder is absent. There is upper abdominal aortic atherosclerosis. Visualized upper abdominal structures otherwise appear unremarkable. Musculoskeletal: There is degenerative change in the thoracic spine. There is thoracic dextroscoliosis. There are no blastic or lytic bone lesions. No chest wall lesions are evident. Review of the MIP images confirms the above findings. IMPRESSION: 1.  No demonstrable pulmonary embolus. 2. Ascending thoracic aortic diameter measures 4.0 x 4.0 cm. No evident dissection. Aorta is tortuous with aortic atherosclerosis. Recommend annual imaging followup by CTA or MRA. This recommendation follows 2010 ACCF/AHA/AATS/ACR/ASA/SCA/SCAI/SIR/STS/SVM Guidelines for the Diagnosis and Management of Patients with Thoracic Aortic Disease. Circulation. 2010; 121: B638-L373. Aortic aneurysm NOS (ICD10-I71.9). There is aortic atherosclerosis as well as foci of great vessel and coronary artery calcification. 3. Mild bibasilar atelectasis. No edema or airspace opacity. No evident pleural effusions. 4.  No evident adenopathy. 5.  Small hiatal hernia. 6.  Absent gallbladder. Aortic Atherosclerosis (ICD10-I70.0). Electronically Signed   By: Lowella Grip III M.D.   On:  04/13/2020 13:51    Recent Labs: Lab Results  Component Value Date   WBC 3.6 (L) 03/14/2020   HGB 12.1 03/14/2020   PLT 221 03/14/2020   NA 134 (L) 04/12/2020   K 3.4 (L) 04/12/2020   CL 99 04/12/2020   CO2 28 04/12/2020   GLUCOSE 150 (H) 04/12/2020   BUN 11 04/12/2020   CREATININE 0.71 04/12/2020   BILITOT 0.6 02/26/2020   ALKPHOS 58 11/30/2019   AST 22 02/26/2020   ALT 20 02/26/2020   PROT 6.4 03/16/2020   ALBUMIN 3.5 11/30/2019   CALCIUM 9.3 04/12/2020   GFRAA 91 02/26/2020   QFTBGOLDPLUS NEGATIVE 03/16/2020    Speciality Comments: No specialty comments available.  Procedures:  No procedures performed Allergies: Tramadol and Statins   Assessment / Plan:     Visit Diagnoses: No diagnosis found.  Orders: No orders of the defined types were placed in this encounter.  No orders of the defined types were placed in this encounter.   Face-to-face time spent with patient was *** minutes. Greater than 50% of time was spent in counseling and coordination of care.  Follow-Up Instructions: No follow-ups on file.   Ofilia Neas, PA-C  Note - This record has been created using Dragon software.  Chart creation errors have been sought, but may not always  have been located. Such creation errors do not reflect on  the standard of medical care.

## 2020-04-26 ENCOUNTER — Other Ambulatory Visit: Payer: Self-pay

## 2020-04-26 ENCOUNTER — Encounter: Payer: Self-pay | Admitting: Physician Assistant

## 2020-04-26 ENCOUNTER — Ambulatory Visit (INDEPENDENT_AMBULATORY_CARE_PROVIDER_SITE_OTHER): Payer: Medicare Other | Admitting: Physician Assistant

## 2020-04-26 ENCOUNTER — Telehealth: Payer: Self-pay | Admitting: Family Medicine

## 2020-04-26 VITALS — BP 155/88 | HR 81 | Resp 16 | Ht 60.0 in | Wt 157.2 lb

## 2020-04-26 DIAGNOSIS — M0579 Rheumatoid arthritis with rheumatoid factor of multiple sites without organ or systems involvement: Secondary | ICD-10-CM | POA: Diagnosis not present

## 2020-04-26 DIAGNOSIS — Z85820 Personal history of malignant melanoma of skin: Secondary | ICD-10-CM

## 2020-04-26 DIAGNOSIS — R059 Cough, unspecified: Secondary | ICD-10-CM

## 2020-04-26 DIAGNOSIS — Z85828 Personal history of other malignant neoplasm of skin: Secondary | ICD-10-CM

## 2020-04-26 DIAGNOSIS — I359 Nonrheumatic aortic valve disorder, unspecified: Secondary | ICD-10-CM

## 2020-04-26 DIAGNOSIS — R05 Cough: Secondary | ICD-10-CM

## 2020-04-26 DIAGNOSIS — M19071 Primary osteoarthritis, right ankle and foot: Secondary | ICD-10-CM

## 2020-04-26 DIAGNOSIS — Z8639 Personal history of other endocrine, nutritional and metabolic disease: Secondary | ICD-10-CM

## 2020-04-26 DIAGNOSIS — M170 Bilateral primary osteoarthritis of knee: Secondary | ICD-10-CM

## 2020-04-26 DIAGNOSIS — Z8679 Personal history of other diseases of the circulatory system: Secondary | ICD-10-CM

## 2020-04-26 DIAGNOSIS — Z8669 Personal history of other diseases of the nervous system and sense organs: Secondary | ICD-10-CM

## 2020-04-26 DIAGNOSIS — Z79899 Other long term (current) drug therapy: Secondary | ICD-10-CM

## 2020-04-26 DIAGNOSIS — M19041 Primary osteoarthritis, right hand: Secondary | ICD-10-CM

## 2020-04-26 DIAGNOSIS — M19072 Primary osteoarthritis, left ankle and foot: Secondary | ICD-10-CM

## 2020-04-26 DIAGNOSIS — Z9049 Acquired absence of other specified parts of digestive tract: Secondary | ICD-10-CM

## 2020-04-26 DIAGNOSIS — I10 Essential (primary) hypertension: Secondary | ICD-10-CM

## 2020-04-26 DIAGNOSIS — Z862 Personal history of diseases of the blood and blood-forming organs and certain disorders involving the immune mechanism: Secondary | ICD-10-CM

## 2020-04-26 DIAGNOSIS — E034 Atrophy of thyroid (acquired): Secondary | ICD-10-CM

## 2020-04-26 DIAGNOSIS — Z8709 Personal history of other diseases of the respiratory system: Secondary | ICD-10-CM

## 2020-04-26 DIAGNOSIS — Z8659 Personal history of other mental and behavioral disorders: Secondary | ICD-10-CM

## 2020-04-26 DIAGNOSIS — R222 Localized swelling, mass and lump, trunk: Secondary | ICD-10-CM

## 2020-04-26 DIAGNOSIS — M19042 Primary osteoarthritis, left hand: Secondary | ICD-10-CM

## 2020-04-26 LAB — COMPLETE METABOLIC PANEL WITH GFR
AG Ratio: 1.7 (calc) (ref 1.0–2.5)
ALT: 11 U/L (ref 6–29)
AST: 14 U/L (ref 10–35)
Albumin: 4.2 g/dL (ref 3.6–5.1)
Alkaline phosphatase (APISO): 71 U/L (ref 37–153)
BUN: 12 mg/dL (ref 7–25)
CO2: 30 mmol/L (ref 20–32)
Calcium: 9.8 mg/dL (ref 8.6–10.4)
Chloride: 97 mmol/L — ABNORMAL LOW (ref 98–110)
Creat: 0.77 mg/dL (ref 0.60–0.93)
GFR, Est African American: 87 mL/min/{1.73_m2} (ref >=60)
GFR, Est Non African American: 75 mL/min/{1.73_m2} (ref >=60)
Globulin: 2.5 g/dL (calc) (ref 1.9–3.7)
Glucose, Bld: 113 mg/dL — ABNORMAL HIGH (ref 65–99)
Potassium: 4 mmol/L (ref 3.5–5.3)
Sodium: 135 mmol/L (ref 135–146)
Total Bilirubin: 0.5 mg/dL (ref 0.2–1.2)
Total Protein: 6.7 g/dL (ref 6.1–8.1)

## 2020-04-26 LAB — CBC WITH DIFFERENTIAL/PLATELET
Absolute Monocytes: 239 cells/uL (ref 200–950)
Basophils Absolute: 8 cells/uL (ref 0–200)
Basophils Relative: 0.2 %
Eosinophils Absolute: 0 cells/uL — ABNORMAL LOW (ref 15–500)
Eosinophils Relative: 0 %
HCT: 38.7 % (ref 35.0–45.0)
Hemoglobin: 13.1 g/dL (ref 11.7–15.5)
Lymphs Abs: 962 cells/uL (ref 850–3900)
MCH: 33.9 pg — ABNORMAL HIGH (ref 27.0–33.0)
MCHC: 33.9 g/dL (ref 32.0–36.0)
MCV: 100.3 fL — ABNORMAL HIGH (ref 80.0–100.0)
MPV: 9.3 fL (ref 7.5–12.5)
Monocytes Relative: 5.7 %
Neutro Abs: 2990 cells/uL (ref 1500–7800)
Neutrophils Relative %: 71.2 %
Platelets: 308 10*3/uL (ref 140–400)
RBC: 3.86 10*6/uL (ref 3.80–5.10)
RDW: 13.6 % (ref 11.0–15.0)
Total Lymphocyte: 22.9 %
WBC: 4.2 10*3/uL (ref 3.8–10.8)

## 2020-04-26 MED ORDER — LEVOTHYROXINE SODIUM 112 MCG PO TABS: 112.0000 ug | ORAL_TABLET | Freq: Every day | ORAL | 1 refills | Status: AC

## 2020-04-26 NOTE — Telephone Encounter (Signed)
Medication refilled to pharmacy.  

## 2020-04-26 NOTE — Telephone Encounter (Signed)
Medication:levothyroxine (SYNTHROID) 112 MCG tablet  Has the patient contacted their pharmacy? No. (If no, request that the patient contact the pharmacy for the refill.) (If yes, when and what did the pharmacy advise?)  Preferred Pharmacy (with phone number or street name): [facility] [place], [address]  [place], [place] [address]  Phone:  [phone] Fax:  [phone]  DEA #:  [id number]  Agent: Please be advised that RX refills may take up to 3 business days. We ask that you follow-up with your pharmacy.

## 2020-04-26 NOTE — Progress Notes (Signed)
Office Visit Note  Patient: Katherine Wang             Date of Birth: 03-May-1946           MRN: [id number]             PCP: [name], MD Referring: [name], MD Visit Date: 04/26/2020 Occupation: @GUAROCC @  Subjective:  Discuss cimzia   History of Present Illness: Katherine Wang is a 74 y.o. female with history of seropositive rheumatoid arthritis and osteoarthritis.  Patient is currently on prednisone 5 mg by mouth daily.  She was prescribed a prednisone taper on 04/15/2020.  She continues to have pain and intermittent inflammation in both hands.  She has chronic pain in both knee joints and notices intermittent swelling in the right knee.  Patient reports that she has received clearance from her dermatologist to start on an office Cimzia.  Patient reports that she has been having a worsening cough over the past several weeks.  She was evaluated by her PCP who ordered several labs.  According to the patient her D-dimer was positive and she was scheduled for a chest CT angio which did not reveal a pulmonary embolism.  She states that she is having some increased thoracic pain which she feels is due to coughing.  She would like to discuss referral to pulmonary for further evaluation.  Activities of Daily Living:  Patient reports morning stiffness for 0  minutes.   Patient Reports nocturnal pain.  Difficulty dressing/grooming: Denies Difficulty climbing stairs: Denies Difficulty getting out of chair: Denies Difficulty using hands for taps, buttons, cutlery, and/or writing: Reports  Review of Systems  Constitutional: Positive for fatigue.  HENT: Negative for mouth sores, mouth dryness and nose dryness.   Eyes: Positive for dryness. Negative for pain and itching.  Respiratory: Negative for shortness of breath and difficulty breathing.   Cardiovascular: Negative for chest pain and palpitations.  Gastrointestinal: Positive for constipation. Negative for blood in stool and  diarrhea.  Endocrine: Negative for increased urination.  Genitourinary: Negative for difficulty urinating and painful urination.  Musculoskeletal: Positive for arthralgias, joint pain, myalgias, muscle tenderness and myalgias. Negative for joint swelling and morning stiffness.  Skin: Positive for color change. Negative for rash and redness.  Allergic/Immunologic: Negative for susceptible to infections.  Neurological: Positive for dizziness. Negative for numbness, headaches, memory loss and weakness.  Hematological: Positive for bruising/bleeding tendency.  Psychiatric/Behavioral: Negative for confusion and sleep disturbance.    PMFS History:  Patient Active Problem List   Diagnosis Date Noted  . Chest pain 04/04/2019  . Anxiety 04/04/2019  . Leukopenia 04/04/2019  . Pneumonia 04/04/2019  . Dyspnea on exertion   . Moderate aortic regurgitation 11/02/2017  . Lightheadedness 11/02/2017  . Tachycardia 07/26/2017  . Atypical chest pain 07/26/2017  . High risk medications (not anticoagulants) long-term use 01/16/2017  . Baker's cyst of knee, left 12/19/2016  . Pain in both knees 12/19/2016  . Pain of both elbows 12/19/2016  . Malignant melanoma ([place]) 12/19/2016  . Basal cell carcinoma 12/19/2016  . Chronic pansinusitis 08/15/2016  . Deviated septum 08/15/2016  . Epistaxis 08/15/2016  . Hypertrophy, nasal, turbinate 08/15/2016  . Upper airway cough syndrome 04/25/2016  . Hx of adenomatous colonic polyps 06/21/2015  . Hyperlipidemia 11/15/2014  . Rheumatoid arthritis ([place]) 09/01/2014  . Pericarditis 04/24/2011  . Aortic valve disorder 02/14/2009  . ELECTROCARDIOGRAM, ABNORMAL 02/14/2009  . PERNICIOUS ANEMIA 07/10/2007  . DEPRESSION 07/03/2007  . MALAISE AND  FATIGUE 07/03/2007  . Hypothyroidism 04/30/2007  . Essential hypertension 04/30/2007  . Aneurysm of thoracic aorta ([place]) 02/13/2006    Past Medical History:  Diagnosis Date  . Adenomatous colon polyp 2012  . Allergy   .  Anxiety   . Aortic insufficiency   . Arthritis   . Basal cell carcinoma   . Cataract   . Depression   . Diverticulosis   . Fibromyalgia   . Fibromyalgia   . Hyperlipidemia   . Hypertension   . Macular degeneration   . Melanoma ([place])   . Murmur, heart   . Reflux   . Stroke ([place]) 20 years ago   per pt. mild stroke  . Thyroid disease    hypothroidism     Family History  Problem Relation Age of Onset  . Heart disease Mother   . Hypertension Mother   . Mental illness Mother   . Allergies Mother   . Heart disease Father   . Hypertension Father   . Mental illness Father   . Emphysema Father        smoked  . Allergies Father   . Heart attack Father   . Hyperlipidemia Sister   . Hypertension Sister   . Allergies Sister   . Cancer Sister        oral  . Hypertension Sister   . Diabetes Sister   . Allergies Sister   . Epilepsy Son   . Aneurysm Son   . Hyperthyroidism Daughter   . Hypothyroidism Daughter   . Hypertension Daughter   . Basal cell carcinoma Daughter   . Colon cancer Neg Hx   . Esophageal cancer Neg Hx   . Rectal cancer Neg Hx   . Stomach cancer Neg Hx   . Pancreatic cancer Neg Hx   . Prostate cancer Neg Hx    Past Surgical History:  Procedure Laterality Date  . ABDOMINAL HYSTERECTOMY    . APPENDECTOMY    . BASAL CELL CARCINOMA EXCISION     nose   . CARDIAC CATHETERIZATION    . CHOLECYSTECTOMY    . COLONOSCOPY     30 + years ago, unsure of where  . EYE SURGERY    . LASIK Bilateral   . MELANOMA EXCISION    . NASAL SEPTUM SURGERY    . TUBAL LIGATION     Social History   Social History Narrative   Lives alone in a one story home.  Has 2 living children.  Retired Engineer, water.     Immunization History  Administered Date(s) Administered  . Influenza, High Dose Seasonal PF 08/16/2016, 12/12/2017, 09/22/2018  . Influenza,inj,Quad PF,6+ Mos 06/30/2019  . PFIZER SARS-COV-2 Vaccination 12/25/2019, 01/19/2020  . Pneumococcal Conjugate-13 09/01/2014   . Pneumococcal Polysaccharide-23 12/12/2017  . Td 11/29/2006     Objective: Vital Signs: BP (!) 155/88 (BP Location: Left Arm, Patient Position: Sitting, Cuff Size: Normal)   Pulse 81   Resp 16   Ht 5' (1.524 m)   Wt 157 lb 3.2 oz (71.3 kg)   BMI 30.70 kg/m    Physical Exam Vitals and nursing note reviewed.  Constitutional:      Appearance: She is well-developed.  HENT:     Head: Normocephalic and atraumatic.  Eyes:     Conjunctiva/sclera: Conjunctivae normal.  Pulmonary:     Effort: Pulmonary effort is normal.  Abdominal:     General: Bowel sounds are normal.     Palpations: Abdomen is soft.  Musculoskeletal:  Cervical back: Normal range of motion.     Comments: Mass palpable in the right paraspinal muscle in the mid-thoracic region.   Lymphadenopathy:     Cervical: No cervical adenopathy.  Skin:    General: Skin is warm and dry.     Capillary Refill: Capillary refill takes less than 2 seconds.  Neurological:     Mental Status: She is alert and oriented to person, place, and time.  Psychiatric:        Behavior: Behavior normal.      Musculoskeletal Exam: C-spine, thoracic spine, and lumbar spine good ROM.  Shoulder joints, elbow joints, wrist joints, MCPs, PIPs, and DIPs good ROM with no synovitis.  Complete fist formation bilaterally.  Tenderness of the right 2nd, 3rd, and 4th MCP joints.  Tenderness of the right 3rd and 4th PIP joints. Hip joints good ROM.  Knee joints good ROM.  Warmth of the right knee joint noted.  Ankle joints have good ROM with no tenderness or inflammation.   CDAI Exam: CDAI Score: 8  Patient Global: 5 mm; Provider Global: 5 mm Swollen: 0 ; Tender: 7  Joint Exam 04/26/2020      Right  Left  Elbow   Tender     MCP 2   Tender     MCP 3   Tender     MCP 4   Tender     PIP 3   Tender     PIP 4   Tender     Knee   Tender        Investigation: No additional findings.  Imaging: DG Chest 2 View  Result Date: 04/11/2020 CLINICAL  DATA:  Cough for several months. Previous COVID vaccination. EXAM: CHEST - 2 VIEW COMPARISON:  Radiographs 08/03/2019 and 04/04/2019. Cardiac CT 08/27/2019 and chest CT 06/17/2019. FINDINGS: The heart size and mediastinal contours are stable with aortic atherosclerosis and tortuosity. The lungs appear clear. There is no pleural effusion or pneumothorax. There is a thoracolumbar scoliosis without acute osseous findings. IMPRESSION: Stable chest.  No active cardiopulmonary process. Electronically Signed   By: Richardean Sale M.D.   On: 04/11/2020 15:03   CT Angio Chest W/Cm &/Or Wo Cm  Result Date: 04/13/2020 CLINICAL DATA:  Shortness of breath with positive D-dimer EXAM: CT ANGIOGRAPHY CHEST WITH CONTRAST TECHNIQUE: Multidetector CT imaging of the chest was performed using the standard protocol during bolus administration of intravenous contrast. Multiplanar CT image reconstructions and MIPs were obtained to evaluate the vascular anatomy. CONTRAST:  164mL OMNIPAQUE IOHEXOL 350 MG/ML SOLN COMPARISON:  Chest CT June 17, 2019; chest radiograph November 12, 2019 FINDINGS: Cardiovascular: There is no demonstrable pulmonary embolus. The ascending thoracic aortic diameter measures 4.0 x 4.0 cm. Measured diameter in the aortic arch measures 3.1 cm. No evident thoracic aortic dissection. There are foci of aortic atherosclerosis. Aorta is tortuous. There are foci of calcification in the visualized great vessels, most notably in the right innominate artery. There are foci of coronary artery calcification. No pericardial effusion or pericardial thickening is evident. Mediastinum/Nodes: No thyroid lesions evident. No evident thoracic adenopathy. There is a small hiatal hernia. Lungs/Pleura: There is no appreciable edema or airspace opacity. There is mild bibasilar atelectasis. No pleural effusions are evident. Upper Abdomen: Gallbladder is absent. There is upper abdominal aortic atherosclerosis. Visualized upper abdominal  structures otherwise appear unremarkable. Musculoskeletal: There is degenerative change in the thoracic spine. There is thoracic dextroscoliosis. There are no blastic or lytic bone lesions. No chest wall lesions  are evident. Review of the MIP images confirms the above findings. IMPRESSION: 1.  No demonstrable pulmonary embolus. 2. Ascending thoracic aortic diameter measures 4.0 x 4.0 cm. No evident dissection. Aorta is tortuous with aortic atherosclerosis. Recommend annual imaging followup by CTA or MRA. This recommendation follows 2010 ACCF/AHA/AATS/ACR/ASA/SCA/SCAI/SIR/STS/SVM Guidelines for the Diagnosis and Management of Patients with Thoracic Aortic Disease. Circulation. 2010; 121: G017-C944. Aortic aneurysm NOS (ICD10-I71.9). There is aortic atherosclerosis as well as foci of great vessel and coronary artery calcification. 3. Mild bibasilar atelectasis. No edema or airspace opacity. No evident pleural effusions. 4.  No evident adenopathy. 5.  Small hiatal hernia. 6.  Absent gallbladder. Aortic Atherosclerosis (ICD10-I70.0). Electronically Signed   By: Lowella Grip III M.D.   On: 04/13/2020 13:51    Recent Labs: Lab Results  Component Value Date   WBC 3.6 (L) 03/14/2020   HGB 12.1 03/14/2020   PLT 221 03/14/2020   NA 134 (L) 04/12/2020   K 3.4 (L) 04/12/2020   CL 99 04/12/2020   CO2 28 04/12/2020   GLUCOSE 150 (H) 04/12/2020   BUN 11 04/12/2020   CREATININE 0.71 04/12/2020   BILITOT 0.6 02/26/2020   ALKPHOS 58 11/30/2019   AST 22 02/26/2020   ALT 20 02/26/2020   PROT 6.4 03/16/2020   ALBUMIN 3.5 11/30/2019   CALCIUM 9.3 04/12/2020   GFRAA 91 02/26/2020   QFTBGOLDPLUS NEGATIVE 03/16/2020    Speciality Comments: No specialty comments available.  Procedures:  No procedures performed Allergies: Tramadol and Statins   Assessment / Plan:     Visit Diagnoses: Rheumatoid arthritis involving multiple sites with positive rheumatoid factor ([place]) -She has tenderness of multiple joints  as described above.  She has ongoing pain and inflammation in both hands and both knee joints.  Warmth of the right knee joint was noted on exam.  She was prescribed a prednisone taper starting at 20 mg tapering by 5 mg every 4 days starting on 04/15/20.  She is currently taking 5 mg and will complete the taper in 4 days.  She has been approved to start on in-office cimiza sq injections.  She will schedule a nurse visit.  CBC and CMP were drawn today prior to starting on cimzia.  She has been experiencing a productive cough over the past several weeks.  She underwent a thorough work up by PCP and completed a course of antibiotics.  According to the patient her PCP does not feel that the cough is infectious.  We are concerned about possible ILD.  High resolution chest CT ordered today.  She will also be referred to pulmonology for further evaluation.  She will follow up in the office in 6 weeks to assess her response to Cimzia.  Plan: Ambulatory referral to Pulmonology  High risk medications (not anticoagulants) long-term use - Cleared by Dr. Derrel Nip to start on in-office cimzia. Scheduling nurse visit.  Discontinued MTX and Arava due to neutropenia.  CBC and CMP will be drawn today prior to starting on Cimzia.  She will return for lab work 1 month after starting on [place].  She has had all baseline immunosuppressive labs.  - Plan: CBC with Differential/Platelet, COMPLETE METABOLIC PANEL WITH GFR  Primary osteoarthritis of both hands: She has PIP and DIP thickening consistent with osteoarthritis of both hands.  Complete fist formation bilaterally.  She has chronic pain and stiffness in both hands.   Primary osteoarthritis of both knees: She has chronic pain in both knee joints.  Warmth of the right  knee joint noted on exam.   Primary osteoarthritis of both feet: She is not having any discomfort in her feet at this time.   Cough -She has been having a worsening cough and wheezing for the past several  weeks.  She was evaluated by her PCP who ordered lab work.  D-Dimer was positive-2.47.  She was scheduled for a Chest CT angiography, which did not reveal a PE. Her cough has been persistent despite taking an antibiotic. According to the patient her PCP does not feel that her cough is infectious.  I reviewed the chest CT from 06/17/19 which revealed scattered persistent ground glass similar in appearance when compared with imaging from 12/05/17.  Minimal changes of early interstitial lung disease are not strictly excluded. A referral to Dr. Chase Caller was placed today for further evaluation. We will place an order for a high resolution chest CT for further evaluation.  She has a palpable mass-right paraspinal muscle in the mid-thoracic region noted.  She will also be referred to general surgery for further evaluation.   Plan: Ambulatory referral to Pulmonology  History of melanoma: We received a letter of clearance from Dr. Derrel Nip to start on in-office cimzia sq injections.    Other medical conditions are listed as follows:   History of macular degeneration  History of anemia  History of cholecystectomy  History of hypothyroidism  History of basal cell carcinoma  History of hypertension  History of anxiety  History of depression  Essential hypertension  Aortic valve disorder  History of asthma    Orders: Orders Placed This Encounter  Procedures  . CT Chest High Resolution  . CBC with Differential/Platelet  . COMPLETE METABOLIC PANEL WITH GFR  . Ambulatory referral to Pulmonology  . Ambulatory referral to General Surgery   No orders of the defined types were placed in this encounter.   Face-to-face time spent with patient was 30 minutes. Greater than 50% of time was spent in counseling and coordination of care.  Follow-Up Instructions: Return in about 6 weeks (around 06/07/2020) for Rheumatoid arthritis.   Ofilia Neas, PA-C  Note - This record has been created using  Dragon software.  Chart creation errors have been sought, but may not always  have been located. Such creation errors do not reflect on  the standard of medical care.

## 2020-04-27 ENCOUNTER — Ambulatory Visit: Payer: Medicare Other | Admitting: Physician Assistant

## 2020-04-27 NOTE — Progress Notes (Signed)
WBC count is WNL.  Rest of CBC stable. Glucose is 113.  Chloride borderline low-97.  Rest of CMP WNL.

## 2020-04-27 NOTE — Progress Notes (Signed)
[facility] [place], [place], [place] [address] [phone]  Date:  04/28/2020   Name:  Katherine Wang   DOB:  1946-05-15   MRN:  [id number]  PCP:  [name], MD    Chief Complaint: Back Pain (right upper back by spine, using heat and ice) and Dizziness (feels like "leaning to one side" )   History of Present Illness:  Katherine Wang is a 74 y.o. very pleasant female patient who presents with the following:  Patient seen in person today for concern of lower back pain History of rheumatoid arthritis, hypothyroidism, hypertension, aortic valve disorder and regurgitation, melanoma  We had a virtual visit together on June 14 for concern of cough-converted to in person visit due to her symptoms of chest pain and shortness of breath We did obtain a CT angiogram for her due to shortness of breath and positive D-dimer, no pulmonary embolism was present.  She does have an ascending aortic aneurysm which is being followed by cardiology  Tetanus booster- likely due, advised her to have completed at pharmacy Colon cancer screen up-to-date Bone density scan- overdue She has completed her COVID-19 series  Recent lab work dated June 29 on chart is reviewed-Per rheumatology  Today she notes a place in his back "right next to my spine that really hurts," it has been present for about 1 week She has tried tylenol/ advil prn She saw Dr Estanislado Pandy yesterday- they are getting ready to start a new RA medication- Cimzia- next week She did carry a box recently which was heavy, but she did not think that she hurt herself  Second, she notes that she may feel lightheaded if she stands up quickly Also, right after she gets out of bed she may feel "like I am leaning to one side"- this has occurred a few times over the last 2-3 months. Will last for a few minutes only.  Does not seem related to movement of her head, not entirely  consistent with BPPV No tinnitus or hearing change No headache She took her BP once after this occurred and it was 168/95, then 132/81 30 minutes later She notes that her blood pressure is "all over the place" sometimes it is quite high and other times much lower. We did obtain orthostatic vitals today-she did show some increase in pulse, otherwise normal No slurred speech, no difficulty chewing or speaking.  No associated other neuro sx noted with episodes of "leaning" feeling  Patient Active Problem List   Diagnosis Date Noted  . Chest pain 04/04/2019  . Anxiety 04/04/2019  . Leukopenia 04/04/2019  . Pneumonia 04/04/2019  . Dyspnea on exertion   . Moderate aortic regurgitation 11/02/2017  . Lightheadedness 11/02/2017  . Tachycardia 07/26/2017  . Atypical chest pain 07/26/2017  . High risk medications (not anticoagulants) long-term use 01/16/2017  . Baker's cyst of knee, left 12/19/2016  . Pain in both knees 12/19/2016  . Pain of both elbows 12/19/2016  . Malignant melanoma ([place]) 12/19/2016  . Basal cell carcinoma 12/19/2016  . Chronic pansinusitis 08/15/2016  . Deviated septum 08/15/2016  . Epistaxis 08/15/2016  . Hypertrophy, nasal, turbinate 08/15/2016  . Upper airway cough syndrome 04/25/2016  . Hx of adenomatous colonic polyps 06/21/2015  . Hyperlipidemia 11/15/2014  . Rheumatoid arthritis ([place]) 09/01/2014  . Pericarditis 04/24/2011  . Aortic valve disorder 02/14/2009  . ELECTROCARDIOGRAM, ABNORMAL 02/14/2009  . PERNICIOUS ANEMIA 07/10/2007  .  DEPRESSION 07/03/2007  . MALAISE AND FATIGUE 07/03/2007  . Hypothyroidism 04/30/2007  . Essential hypertension 04/30/2007  . Aneurysm of thoracic aorta ([place]) 02/13/2006    Past Medical History:  Diagnosis Date  . Adenomatous colon polyp 2012  . Allergy   . Anxiety   . Aortic insufficiency   . Arthritis   . Basal cell carcinoma   . Cataract   . Depression   . Diverticulosis   . Fibromyalgia   . Fibromyalgia   .  Hyperlipidemia   . Hypertension   . Macular degeneration   . Melanoma ([place])   . Murmur, heart   . Reflux   . Stroke ([place]) 20 years ago   per pt. mild stroke  . Thyroid disease    hypothroidism     Past Surgical History:  Procedure Laterality Date  . ABDOMINAL HYSTERECTOMY    . APPENDECTOMY    . BASAL CELL CARCINOMA EXCISION     nose   . CARDIAC CATHETERIZATION    . CHOLECYSTECTOMY    . COLONOSCOPY     30 + years ago, unsure of where  . EYE SURGERY    . LASIK Bilateral   . MELANOMA EXCISION    . NASAL SEPTUM SURGERY    . TUBAL LIGATION      Social History   Tobacco Use  . Smoking status: Former Smoker    Packs/day: 2.00    Years: 20.00    Pack years: 40.00    Types: Cigarettes    Quit date: 10/30/1983    Years since quitting: 36.5  . Smokeless tobacco: Never Used  Vaping Use  . Vaping Use: Never used  Substance Use Topics  . Alcohol use: No    Alcohol/week: 0.0 standard drinks  . Drug use: Never    Family History  Problem Relation Age of Onset  . Heart disease Mother   . Hypertension Mother   . Mental illness Mother   . Allergies Mother   . Heart disease Father   . Hypertension Father   . Mental illness Father   . Emphysema Father        smoked  . Allergies Father   . Heart attack Father   . Hyperlipidemia Sister   . Hypertension Sister   . Allergies Sister   . Cancer Sister        oral  . Hypertension Sister   . Diabetes Sister   . Allergies Sister   . Epilepsy Son   . Aneurysm Son   . Hyperthyroidism Daughter   . Hypothyroidism Daughter   . Hypertension Daughter   . Basal cell carcinoma Daughter   . Colon cancer Neg Hx   . Esophageal cancer Neg Hx   . Rectal cancer Neg Hx   . Stomach cancer Neg Hx   . Pancreatic cancer Neg Hx   . Prostate cancer Neg Hx     Allergies  Allergen Reactions  . Tramadol Other (See Comments)    dizzy  . Statins     Body ache    Medication list has been reviewed and updated.  Current Outpatient  Medications on File Prior to Visit  Medication Sig Dispense Refill  . albuterol (VENTOLIN HFA) 108 (90 Base) MCG/ACT inhaler Inhale 2 puffs into the lungs every 6 (six) hours as needed for wheezing or shortness of breath. 18 g 2  . amLODipine (NORVASC) 10 MG tablet Take 1 tablet (10 mg total) by mouth daily. At bedtime 90 tablet 3  .  aspirin EC 81 MG tablet Take 1 tablet (81 mg total) by mouth daily. 90 tablet 3  . carvedilol (COREG) 3.125 MG tablet Take 1 tablet (3.125 mg total) by mouth 2 (two) times daily with a meal. 180 tablet 3  . cholecalciferol (VITAMIN D3) 25 MCG (1000 UT) tablet Take 2,000 Units by mouth daily.    . clonazePAM (KLONOPIN) 0.5 MG tablet TAKE 1 TABLET BY MOUTH AT BEDTIME. MAY TAKE 2 TIMES DAILY AS NEEDED ON OCCASION 60 tablet 2  . Cyanocobalamin (VITAMIN B 12 PO) Take 1 tablet by mouth daily.    . cycloSPORINE (RESTASIS) 0.05 % ophthalmic emulsion Place 1 drop into both eyes 2 (two) times daily.     . famotidine (PEPCID) 20 MG tablet Take 1 tablet (20 mg total) by mouth daily. 30 tablet 5  . folic acid (FOLVITE) 1 MG tablet Take 2 mg by mouth daily.    . hydrochlorothiazide (HYDRODIURIL) 12.5 MG tablet Take 1 Tablet Daily 30 tablet 3  . ibuprofen (ADVIL,MOTRIN) 200 MG tablet Take 600 mg by mouth 2 (two) times daily as needed for mild pain.    Marland Kitchen irbesartan (AVAPRO) 300 MG tablet Take 1 tablet (300 mg total) by mouth daily. 30 tablet 11  . levothyroxine (SYNTHROID) 112 MCG tablet Take 1 tablet (112 mcg total) by mouth daily. 90 tablet 1  . Multiple Vitamins-Minerals (PRESERVISION AREDS) TABS Take 1 tablet by mouth 2 (two) times daily.    . predniSONE (DELTASONE) 5 MG tablet Take 4 tabs po x 4 days, 3  tabs po x 4 days, 2  tabs po x 4 days, 1  tab po x 4 days 40 tablet 0  . rosuvastatin (CRESTOR) 10 MG tablet Take 1 tablet (10 mg total) by mouth daily. 30 tablet 5  . sertraline (ZOLOFT) 100 MG tablet TAKE 2 TABLETS(200 MG) BY MOUTH DAILY 180 tablet 1   No current  facility-administered medications on file prior to visit.    Review of Systems:  As per HPI- otherwise negative.    Physical Examination: Vitals:   04/28/20 1036  BP: 110/70  Pulse: 70  SpO2: 96%   Vitals:   04/28/20 1036  Weight: 157 lb (71.2 kg)  Height: 5' (1.524 m)   Body mass index is 30.66 kg/m. Ideal Body Weight: Weight in (lb) to have BMI = 25: 127.7  GEN: no acute distress.  Overweight, looks well  HEENT: Atraumatic, Normocephalic. Bilateral TM wnl, oropharynx normal.  PEERL,EOMI.   Ears and Nose: No external deformity. CV: RRR, No M/G/R. No JVD. No thrill. No extra heart sounds. PULM: CTA B, no wheezes, crackles, rhonchi. No retractions. No resp. distress. No accessory muscle use. ABD: S, NT, ND, +BS. No rebound. No HSM. EXTR: No c/c/e PSYCH: Normally interactive. Conversant.  Normal strength, sensation, DTR of all extremities.  Normal gait.  Normal facial motion and sensation At the thoracolumbar juncture she has tenderness and spasm of the paraspinous muscles on the right.  Bony tenderness is not apparent.  Thoracolumbar range of motion seems normal   Assessment and Plan: Mid back pain on right side - Plan: DG Thoracic Spine 2 View, DG Lumbar Spine Complete, methocarbamol (ROBAXIN-750) 750 MG tablet  Vertigo  Blood pressure alteration  Orthostatic hypotension  Labile blood pressure  Patient here today with a couple of concerns.  She seems to have muscle spasm and resultant tenderness in her right sided paraspinous muscles.  We will also obtain plain films to rule out a compression  fracture or other bony pathology Cautioned that Robaxin can cause sedation, do not use when driving  She has noted symptoms of possible vertigo.  This may also be orthostatic hypotension I suggested having her see neurology, she would like to talk with her daughter about which neurologis she should see I would like to arrange a 24-hour blood pressure monitor for her.  I will  touch base with cardiology to set this up-however, I am unable to get this order to sign as it does not appear to be covered by Medicare.  We will discuss this with patient with her x-ray reports  This visit occurred during the SARS-CoV-2 public health emergency.  Safety protocols were in place, including screening questions prior to the visit, additional usage of staff PPE, and extensive cleaning of exam room while observing appropriate contact time as indicated for disinfecting solutions.    Signed Lamar Blinks, MD

## 2020-04-27 NOTE — Patient Instructions (Addendum)
It was good to see you again today Please get a tetanus vaccine at your convenience  Please go to the ground floor to get your x-rays, then you can go home I sent an rx for robaxin to your drug store- this can cause sedation, use with caution Heat, gentle activity and stretching may be helpful for you  I would like to get you a 24 hour BP monitor - will consult with cardiology for you Please let me know who you would like to see as far as neurology  Let me know if any change or worsening of your symptoms in the meantime!

## 2020-04-28 ENCOUNTER — Ambulatory Visit (HOSPITAL_BASED_OUTPATIENT_CLINIC_OR_DEPARTMENT_OTHER)
Admission: RE | Admit: 2020-04-28 | Discharge: 2020-04-28 | Disposition: A | Discharge status: Home / Self Care | Payer: Medicare Other | Source: Ambulatory Visit | Attending: Family Medicine | Admitting: Family Medicine

## 2020-04-28 ENCOUNTER — Other Ambulatory Visit: Payer: Self-pay

## 2020-04-28 ENCOUNTER — Encounter: Payer: Self-pay | Admitting: Family Medicine

## 2020-04-28 ENCOUNTER — Ambulatory Visit (INDEPENDENT_AMBULATORY_CARE_PROVIDER_SITE_OTHER): Payer: Medicare Other | Admitting: Family Medicine

## 2020-04-28 VITALS — BP 110/70 | HR 70 | Ht 60.0 in | Wt 157.0 lb

## 2020-04-28 DIAGNOSIS — R42 Dizziness and giddiness: Secondary | ICD-10-CM | POA: Diagnosis not present

## 2020-04-28 DIAGNOSIS — R0989 Other specified symptoms and signs involving the circulatory and respiratory systems: Secondary | ICD-10-CM

## 2020-04-28 DIAGNOSIS — I951 Orthostatic hypotension: Secondary | ICD-10-CM | POA: Diagnosis not present

## 2020-04-28 DIAGNOSIS — M545 Low back pain: Secondary | ICD-10-CM | POA: Diagnosis not present

## 2020-04-28 DIAGNOSIS — M549 Dorsalgia, unspecified: Secondary | ICD-10-CM | POA: Diagnosis present

## 2020-04-28 DIAGNOSIS — M546 Pain in thoracic spine: Secondary | ICD-10-CM | POA: Diagnosis not present

## 2020-04-28 DIAGNOSIS — R6889 Other general symptoms and signs: Secondary | ICD-10-CM

## 2020-04-28 MED ORDER — METHOCARBAMOL 750 MG PO TABS: 750.0000 mg | ORAL_TABLET | Freq: Three times a day (TID) | ORAL | 0 refills | Status: DC | PRN

## 2020-04-29 ENCOUNTER — Encounter: Payer: Self-pay | Admitting: Family Medicine

## 2020-05-03 ENCOUNTER — Other Ambulatory Visit: Payer: Self-pay

## 2020-05-03 ENCOUNTER — Telehealth: Payer: Self-pay | Admitting: Rheumatology

## 2020-05-03 ENCOUNTER — Ambulatory Visit (INDEPENDENT_AMBULATORY_CARE_PROVIDER_SITE_OTHER): Payer: Medicare Other | Admitting: Pharmacist

## 2020-05-03 VITALS — BP 170/95 | HR 90

## 2020-05-03 DIAGNOSIS — M0579 Rheumatoid arthritis with rheumatoid factor of multiple sites without organ or systems involvement: Secondary | ICD-10-CM | POA: Diagnosis not present

## 2020-05-03 DIAGNOSIS — Z79899 Other long term (current) drug therapy: Secondary | ICD-10-CM

## 2020-05-03 MED ORDER — CERTOLIZUMAB PEGOL 2 X 200 MG ~~LOC~~ KIT
400.0000 mg | PACK | Freq: Once | SUBCUTANEOUS | Status: AC
  Administered 2020-05-03: 400 mg via SUBCUTANEOUS

## 2020-05-03 NOTE — Patient Instructions (Addendum)
   START Zyrtec daily at betime for the next month to help minimize risk of injection site reactions

## 2020-05-03 NOTE — Telephone Encounter (Addendum)
Patient states she called Dr. Golden Pop office and was told there were no appointments available at this time, but that she would be added to a cancellation list.  Patient states she was told "there are hundred's of people on the list" and that "she is welcome to call the office often to check if they received a cancellation."  Patient is requesting a return call.

## 2020-05-03 NOTE — Progress Notes (Signed)
Pharmacy Note  Subjective:   Patient presents to clinic today to receive first dose of Cimzia.  Patient running a fever or have signs/symptoms of infection? No  Patient currently on antibiotics for the treatment of infection? No  Patient have any upcoming invasive procedures/surgeries? No  Objective: CMP     Component Value Date/Time   NA 135 04/26/2020 1532   NA 140 04/29/2017 1054   K 4.0 04/26/2020 1532   CL 97 (L) 04/26/2020 1532   CO2 30 04/26/2020 1532   GLUCOSE 113 (H) 04/26/2020 1532   BUN 12 04/26/2020 1532   BUN 11 04/29/2017 1054   CREATININE 0.77 04/26/2020 1532   CALCIUM 9.8 04/26/2020 1532   PROT 6.7 04/26/2020 1532   PROT 6.5 10/14/2018 0804   ALBUMIN 3.5 11/30/2019 1036   ALBUMIN 4.0 10/14/2018 0804   AST 14 04/26/2020 1532   ALT 11 04/26/2020 1532   ALKPHOS 58 11/30/2019 1036   BILITOT 0.5 04/26/2020 1532   BILITOT 0.5 10/14/2018 0804   GFRNONAA 75 04/26/2020 1532   GFRAA 87 04/26/2020 1532    CBC    Component Value Date/Time   WBC 4.2 04/26/2020 1532   RBC 3.86 04/26/2020 1532   HGB 13.1 04/26/2020 1532   HCT 38.7 04/26/2020 1532   PLT 308 04/26/2020 1532   MCV 100.3 (H) 04/26/2020 1532   MCH 33.9 (H) 04/26/2020 1532   MCHC 33.9 04/26/2020 1532   RDW 13.6 04/26/2020 1532   LYMPHSABS 962 04/26/2020 1532   MONOABS 0.2 08/03/2019 2008   EOSABS 0 (L) 04/26/2020 1532   BASOSABS 8 04/26/2020 1532    Baseline Immunosuppressant Therapy Labs TB GOLD Quantiferon TB Gold Latest Ref Rng & Units 03/16/2020  Quantiferon TB Gold Plus NEGATIVE NEGATIVE   Hepatitis Panel Hepatitis Latest Ref Rng & Units 03/16/2020  Hep B Surface Ag NON-REACTI NON-REACTIVE  Hep B IgM NON-REACTI NON-REACTIVE  Hep C Ab NEGATIVE -  Hep C Ab NON-REACTI NON-REACTIVE  Hep C Ab NON-REACTI NON-REACTIVE   HIV No results found for: HIV Immunoglobulins Immunoglobulin Electrophoresis Latest Ref Rng & Units 03/16/2020  IgA  70 - 320 mg/dL 81  IgG 600 - 1,540 mg/dL 743  IgM  50 - 300 mg/dL 158   SPEP Serum Protein Electrophoresis Latest Ref Rng & Units 04/26/2020  Total Protein 6.1 - 8.1 g/dL 6.7  Albumin 3.8 - 4.8 g/dL -  Alpha-1 0.2 - 0.3 g/dL -  Alpha-2 0.5 - 0.9 g/dL -  Beta Globulin 0.4 - 0.6 g/dL -  Beta 2 0.2 - 0.5 g/dL -  Gamma Globulin 0.8 - 1.7 g/dL -  Interpretation - -   G6PD No results found for: G6PDH TPMT No results found for: TPMT   Chest x-ray: Stable chest.  No active cardiopulmonary process.04/11/2020  Assessment/Plan:   Administrations This Visit    Certolizumab Pegol KIT 400 mg    Admin Date 05/03/2020 Action Given Dose 400 mg Route Subcutaneous Administered By Mariella Saa C, RPH-CPP         Patient monitored for 30 mins after injection for allergic reaction and adverse events and none noted.   Appointment for next injection scheduled for 05/17/20 _0 :30AM.  Patient due for labs in 1 month.  Patient is to call and reschedule appointment if running a fever with signs/symptoms of infection, on antibiotics for active infection or has an upcoming invasive procedure.  All questions encouraged and answered.  Instructed patient to call with any further questions or concerns.   Museum/gallery conservator  Zuhair Lariccia, PharmD, BCACP, CPP Clinical Specialty Pharmacist (Rheumatology and Pulmonology)  05/03/2020 11:55 AM

## 2020-05-03 NOTE — Telephone Encounter (Signed)
I called patient, patient wants to see Dr. Chase Caller and will wait for appt.

## 2020-05-04 ENCOUNTER — Ambulatory Visit: Payer: Medicare Other

## 2020-05-06 ENCOUNTER — Telehealth: Payer: Self-pay | Admitting: Pharmacist

## 2020-05-06 NOTE — Telephone Encounter (Signed)
Prescribed by pharmD at [facility] 3/30, routed to pharm to review

## 2020-05-06 NOTE — Telephone Encounter (Signed)
Spoke with patient.  She recently had scan that showed "lots of plaque build-up around aorta".   Explained that we could use Nexletol, however Repatha has data for plaque regression.  Patient not crazy about injections, but understands the benefit and willing to try.  Will have [facility] start PA process Monday

## 2020-05-06 NOTE — Telephone Encounter (Signed)
Pt c/o medication issue:  1. Name of Medication: zetia (ezetimibe) 10mg    2. How are you currently taking this medication (dosage and times per day)? Not currently taking medication   3. Are you having a reaction (difficulty breathing--STAT)? No   4. What is your medication issue? Deangela is calling stating she stopped taking this medication 2-3 weeks after having it prescribed due to it causing stomach issues, but would like to begin taking an alternative. Please advise.

## 2020-05-09 NOTE — Telephone Encounter (Signed)
Pa submitted for praluent

## 2020-05-10 ENCOUNTER — Telehealth: Payer: Self-pay | Admitting: Rheumatology

## 2020-05-10 NOTE — Telephone Encounter (Signed)
Patient has discontinued MTX.   Okay for patient to discontinue Folic Acid? Please advise.

## 2020-05-10 NOTE — Telephone Encounter (Signed)
Yes, okay to discontinue folic acid.

## 2020-05-10 NOTE — Telephone Encounter (Signed)
Patient advised since she has discontinued MTX, she may discontinue the Folic Acid.

## 2020-05-10 NOTE — Telephone Encounter (Signed)
Patient called stating her prescription of Folic Acid was written for 1 tablet per day and she takes 2 tablets per day.  Patient states she has been out of medication for 2 days.  Patient is requesting the prescription be sent to [facility] at [address].

## 2020-05-11 ENCOUNTER — Other Ambulatory Visit: Payer: Self-pay

## 2020-05-11 ENCOUNTER — Ambulatory Visit (HOSPITAL_COMMUNITY)
Admission: RE | Admit: 2020-05-11 | Discharge: 2020-05-11 | Disposition: A | Discharge status: Home / Self Care | Payer: Medicare Other | Source: Ambulatory Visit | Attending: Physician Assistant | Admitting: Physician Assistant

## 2020-05-11 DIAGNOSIS — J432 Centrilobular emphysema: Secondary | ICD-10-CM | POA: Diagnosis not present

## 2020-05-11 DIAGNOSIS — R05 Cough: Secondary | ICD-10-CM | POA: Diagnosis not present

## 2020-05-11 DIAGNOSIS — R059 Cough, unspecified: Secondary | ICD-10-CM

## 2020-05-11 DIAGNOSIS — M0579 Rheumatoid arthritis with rheumatoid factor of multiple sites without organ or systems involvement: Secondary | ICD-10-CM | POA: Insufficient documentation

## 2020-05-11 DIAGNOSIS — I700 Atherosclerosis of aorta: Secondary | ICD-10-CM | POA: Diagnosis not present

## 2020-05-11 DIAGNOSIS — I2510 Atherosclerotic heart disease of native coronary artery without angina pectoris: Secondary | ICD-10-CM | POA: Diagnosis not present

## 2020-05-11 DIAGNOSIS — I712 Thoracic aortic aneurysm, without rupture: Secondary | ICD-10-CM | POA: Diagnosis not present

## 2020-05-11 NOTE — Progress Notes (Signed)
CT chest did not reveal any findings consistent with ILD.   She was referred to pulmonology for further evaluation of the cough she has been experiencing.   A right lower lobe ground-glass nodule was noted and compared to previous imaging.  She will require a repeat chest CT in 12 months for surveillance.    These will results will be reviewed and discussed in detail at her pulm appointment.

## 2020-05-12 DIAGNOSIS — M9902 Segmental and somatic dysfunction of thoracic region: Secondary | ICD-10-CM | POA: Diagnosis not present

## 2020-05-12 DIAGNOSIS — M47814 Spondylosis without myelopathy or radiculopathy, thoracic region: Secondary | ICD-10-CM | POA: Diagnosis not present

## 2020-05-12 DIAGNOSIS — M4125 Other idiopathic scoliosis, thoracolumbar region: Secondary | ICD-10-CM | POA: Diagnosis not present

## 2020-05-12 DIAGNOSIS — M47816 Spondylosis without myelopathy or radiculopathy, lumbar region: Secondary | ICD-10-CM | POA: Diagnosis not present

## 2020-05-12 DIAGNOSIS — S29012A Strain of muscle and tendon of back wall of thorax, initial encounter: Secondary | ICD-10-CM | POA: Diagnosis not present

## 2020-05-12 DIAGNOSIS — M9903 Segmental and somatic dysfunction of lumbar region: Secondary | ICD-10-CM | POA: Diagnosis not present

## 2020-05-13 ENCOUNTER — Telehealth: Payer: Self-pay

## 2020-05-13 MED ORDER — PRALUENT 150 MG/ML ~~LOC~~ SOAJ: 150.0000 mg | SUBCUTANEOUS | 11 refills | Status: AC

## 2020-05-13 NOTE — Telephone Encounter (Signed)
Called and spoke w/pt regarding the approval of the praluent, rx sent, pt stated that she would like an appt w/pharmd at [facility] office just to teach her to administer the shot. I will route to the pharmd pool to see when a pharmd would have some availability

## 2020-05-13 NOTE — Telephone Encounter (Signed)
Called and spoke w/pt regarding scheduling an appt for a praluent shot demo. Pt was agreeable to 05/16/20 at 1130 onsite at the [facility] location. Pt voiced understanding and gratitude

## 2020-05-16 ENCOUNTER — Other Ambulatory Visit: Payer: Self-pay | Admitting: Family Medicine

## 2020-05-16 ENCOUNTER — Ambulatory Visit: Payer: Medicare Other

## 2020-05-16 DIAGNOSIS — F419 Anxiety disorder, unspecified: Secondary | ICD-10-CM

## 2020-05-16 MED ORDER — CLONAZEPAM 0.5 MG PO TABS: ORAL_TABLET | ORAL | 2 refills | Status: AC

## 2020-05-16 NOTE — Telephone Encounter (Signed)
Requesting: clonazepam Contract:none CGB:[id number] Last Visit: 04/28/2020 Next Visit: none scheduled Last Refill:12/17/2019 60 tablets 2 refills  Please Advise

## 2020-05-16 NOTE — Progress Notes (Deleted)
Pharmacy Note  Subjective:   Patient presents to clinic today to receive second loading dose of Cimzia.  Patient running a fever or have signs/symptoms of infection? {yes/no:20286}  Patient currently on antibiotics for the treatment of infection? {yes/no:20286}  Patient have any upcoming invasive procedures/surgeries? {yes/no:20286}  Objective: CMP     Component Value Date/Time   NA 135 04/26/2020 1532   NA 140 04/29/2017 1054   K 4.0 04/26/2020 1532   CL 97 (L) 04/26/2020 1532   CO2 30 04/26/2020 1532   GLUCOSE 113 (H) 04/26/2020 1532   BUN 12 04/26/2020 1532   BUN 11 04/29/2017 1054   CREATININE 0.77 04/26/2020 1532   CALCIUM 9.8 04/26/2020 1532   PROT 6.7 04/26/2020 1532   PROT 6.5 10/14/2018 0804   ALBUMIN 3.5 11/30/2019 1036   ALBUMIN 4.0 10/14/2018 0804   AST 14 04/26/2020 1532   ALT 11 04/26/2020 1532   ALKPHOS 58 11/30/2019 1036   BILITOT 0.5 04/26/2020 1532   BILITOT 0.5 10/14/2018 0804   GFRNONAA 75 04/26/2020 1532   GFRAA 87 04/26/2020 1532    CBC    Component Value Date/Time   WBC 4.2 04/26/2020 1532   RBC 3.86 04/26/2020 1532   HGB 13.1 04/26/2020 1532   HCT 38.7 04/26/2020 1532   PLT 308 04/26/2020 1532   MCV 100.3 (H) 04/26/2020 1532   MCH 33.9 (H) 04/26/2020 1532   MCHC 33.9 04/26/2020 1532   RDW 13.6 04/26/2020 1532   LYMPHSABS 962 04/26/2020 1532   MONOABS 0.2 08/03/2019 2008   EOSABS 0 (L) 04/26/2020 1532   BASOSABS 8 04/26/2020 1532    Baseline Immunosuppressant Therapy Labs TB GOLD Quantiferon TB Gold Latest Ref Rng & Units 03/16/2020  Quantiferon TB Gold Plus NEGATIVE NEGATIVE   Hepatitis Panel Hepatitis Latest Ref Rng & Units 03/16/2020  Hep B Surface Ag NON-REACTI NON-REACTIVE  Hep B IgM NON-REACTI NON-REACTIVE  Hep C Ab NEGATIVE -  Hep C Ab NON-REACTI NON-REACTIVE  Hep C Ab NON-REACTI NON-REACTIVE   HIV No results found for: HIV Immunoglobulins Immunoglobulin Electrophoresis Latest Ref Rng & Units 03/16/2020  IgA  70 - 320  mg/dL 81  IgG 600 - 1,540 mg/dL 743  IgM 50 - 300 mg/dL 158   SPEP Serum Protein Electrophoresis Latest Ref Rng & Units 04/26/2020  Total Protein 6.1 - 8.1 g/dL 6.7  Albumin 3.8 - 4.8 g/dL -  Alpha-1 0.2 - 0.3 g/dL -  Alpha-2 0.5 - 0.9 g/dL -  Beta Globulin 0.4 - 0.6 g/dL -  Beta 2 0.2 - 0.5 g/dL -  Gamma Globulin 0.8 - 1.7 g/dL -  Interpretation - -   G6PD No results found for: G6PDH TPMT No results found for: TPMT   Chest x-ray: ***  Assessment/Plan:   Patient tolerated injection  ***.   Appointment for next injection scheduled for ***.  Patient due for labs in ***.  Patient is to call and reschedule appointment if running a fever with signs/symptoms of infection, on antibiotics for active infection or has an upcoming invasive procedure.  All questions encouraged and answered.  Instructed patient to call with any further questions or concerns.

## 2020-05-16 NOTE — Telephone Encounter (Signed)
Medication:  clonazePAM (KLONOPIN) 0.5 MG tablet [[id number]]   Has the patient contacted their pharmacy? No. (If no, request that the patient contact the pharmacy for the refill.) (If yes, when and what did the pharmacy advise?)  Preferred Pharmacy (with phone number or street name):  [facility] [place], [address]  [place], [place] [address]  Phone:  [phone] Fax:  [phone]  Agent: Please be advised that RX refills may take up to 3 business days. We ask that you follow-up with your pharmacy.

## 2020-05-17 ENCOUNTER — Ambulatory Visit: Payer: Medicare Other

## 2020-05-17 ENCOUNTER — Telehealth: Payer: Self-pay

## 2020-05-17 ENCOUNTER — Telehealth (HOSPITAL_COMMUNITY): Payer: Self-pay

## 2020-05-17 NOTE — Telephone Encounter (Signed)
Patient advised and verbalized understanding 

## 2020-05-17 NOTE — Telephone Encounter (Signed)
Patient calling because her injection is not at [facility]. Spoke to Radisson - she will call back to reschedule.

## 2020-05-17 NOTE — Telephone Encounter (Signed)
Pt called stating she has had sore throat x4 days, yellow mucous, pt thinks it is going into bronchitis.  They will not do another RA injection until they find out if she has an infection or not.  I offered pt a virtual visit with another provider, but she refused and stated she only wanted this message to go to you.  I did let her know your schedule was full the rest of the week.

## 2020-05-17 NOTE — Telephone Encounter (Signed)
Patient scheduled for VV with PCP on 05/18/20, [place] approved.

## 2020-05-17 NOTE — Telephone Encounter (Signed)
Patient was seen a lipid clinic and they started her on Pralunet instead of [place]. Patient wanted to know why she was started on Praluent instead of repatha and I explained to her that it may be due to her insurance company and their formulary, I also advised her that they were both apart of the same drug class but patient insisted that you be informed and wanted to know if you think she should start the [place].

## 2020-05-17 NOTE — Telephone Encounter (Signed)
Praluent and Repatha are essentially interchangeable.  It is ok.

## 2020-05-18 ENCOUNTER — Other Ambulatory Visit: Payer: Self-pay

## 2020-05-18 ENCOUNTER — Telehealth (INDEPENDENT_AMBULATORY_CARE_PROVIDER_SITE_OTHER): Payer: Medicare Other | Admitting: Family Medicine

## 2020-05-18 DIAGNOSIS — J029 Acute pharyngitis, unspecified: Secondary | ICD-10-CM

## 2020-05-18 MED ORDER — AMOXICILLIN 500 MG PO CAPS: 500.0000 mg | ORAL_CAPSULE | Freq: Two times a day (BID) | ORAL | 0 refills | Status: DC

## 2020-05-18 NOTE — Progress Notes (Signed)
[place] at [facility] [address], [place], [place] [address] [phone]  Date:  05/18/2020   Name:  Katherine Wang   DOB:  April 14, 1946   MRN:  [id number]  PCP:  [name], MD    Chief Complaint: No chief complaint on file.   History of Present Illness:  Katherine Wang is a 74 y.o. very pleasant female patient who presents with the following:  Virtual visit today for sick visit History of rheumatoid arthritis, hypothyroidism, hypertension, aortic valve disorder and regurgitation, melanoma Pt location is home, provider is at office Pt ID confirmed with 2 factors, she gives consent for virtual visit today The pt and myself are present on the call today   Her rheumatologist is concerned about giving her Cimzia dose if she may be sick  She was supposed to get this done yesterday She has completed her covid series   She has noted a sore and red throat for about 4 days She is NOT generally brining a lot out of her sinuses- did blow out some yellow material once She is not generally coughing, her chest is a bit congested She may get a little ear pain off and on  No vomiting or diarrhea  Patient Active Problem List   Diagnosis Date Noted  . Chest pain 04/04/2019  . Anxiety 04/04/2019  . Leukopenia 04/04/2019  . Pneumonia 04/04/2019  . Dyspnea on exertion   . Moderate aortic regurgitation 11/02/2017  . Lightheadedness 11/02/2017  . Tachycardia 07/26/2017  . Atypical chest pain 07/26/2017  . High risk medications (not anticoagulants) long-term use 01/16/2017  . High risk medication use 12/19/2016  . Baker's cyst of knee, left 12/19/2016  . Pain in both knees 12/19/2016  . Pain of both elbows 12/19/2016  . Malignant melanoma ([place]) 12/19/2016  . Basal cell carcinoma 12/19/2016  . Chronic pansinusitis 08/15/2016  . Deviated septum 08/15/2016  . Epistaxis 08/15/2016  . Hypertrophy, nasal, turbinate 08/15/2016  .  Upper airway cough syndrome 04/25/2016  . Hx of adenomatous colonic polyps 06/21/2015  . Hyperlipidemia 11/15/2014  . Rheumatoid arthritis ([place]) 09/01/2014  . Pericarditis 04/24/2011  . Aortic valve disorder 02/14/2009  . ELECTROCARDIOGRAM, ABNORMAL 02/14/2009  . PERNICIOUS ANEMIA 07/10/2007  . DEPRESSION 07/03/2007  . MALAISE AND FATIGUE 07/03/2007  . Hypothyroidism 04/30/2007  . Essential hypertension 04/30/2007  . Aneurysm of thoracic aorta ([place]) 02/13/2006    Past Medical History:  Diagnosis Date  . Adenomatous colon polyp 2012  . Allergy   . Anxiety   . Aortic insufficiency   . Arthritis   . Basal cell carcinoma   . Cataract   . Depression   . Diverticulosis   . Fibromyalgia   . Fibromyalgia   . Hyperlipidemia   . Hypertension   . Macular degeneration   . Melanoma ([place])   . Murmur, heart   . Reflux   . Stroke ([place]) 20 years ago   per pt. mild stroke  . Thyroid disease    hypothroidism     Past Surgical History:  Procedure Laterality Date  . ABDOMINAL HYSTERECTOMY    . APPENDECTOMY    . BASAL CELL CARCINOMA EXCISION     nose   . CARDIAC CATHETERIZATION    . CHOLECYSTECTOMY    . COLONOSCOPY     30 + years ago, unsure of where  . EYE SURGERY    . LASIK Bilateral   . MELANOMA EXCISION    .  NASAL SEPTUM SURGERY    . TUBAL LIGATION      Social History   Tobacco Use  . Smoking status: Former Smoker    Packs/day: 2.00    Years: 20.00    Pack years: 40.00    Types: Cigarettes    Quit date: 10/30/1983    Years since quitting: 36.5  . Smokeless tobacco: Never Used  Vaping Use  . Vaping Use: Never used  Substance Use Topics  . Alcohol use: No    Alcohol/week: 0.0 standard drinks  . Drug use: Never    Family History  Problem Relation Age of Onset  . Heart disease Mother   . Hypertension Mother   . Mental illness Mother   . Allergies Mother   . Heart disease Father   . Hypertension Father   . Mental illness Father   . Emphysema Father         smoked  . Allergies Father   . Heart attack Father   . Hyperlipidemia Sister   . Hypertension Sister   . Allergies Sister   . Cancer Sister        oral  . Hypertension Sister   . Diabetes Sister   . Allergies Sister   . Epilepsy Son   . Aneurysm Son   . Hyperthyroidism Daughter   . Hypothyroidism Daughter   . Hypertension Daughter   . Basal cell carcinoma Daughter   . Colon cancer Neg Hx   . Esophageal cancer Neg Hx   . Rectal cancer Neg Hx   . Stomach cancer Neg Hx   . Pancreatic cancer Neg Hx   . Prostate cancer Neg Hx     Allergies  Allergen Reactions  . Tramadol Other (See Comments)    dizzy  . Statins     Body ache    Medication list has been reviewed and updated.  Current Outpatient Medications on File Prior to Visit  Medication Sig Dispense Refill  . albuterol (VENTOLIN HFA) 108 (90 Base) MCG/ACT inhaler Inhale 2 puffs into the lungs every 6 (six) hours as needed for wheezing or shortness of breath. 18 g 2  . Alirocumab (PRALUENT) 150 MG/ML SOAJ Inject 150 mg into the skin every 14 (fourteen) days. 2 pen 11  . amLODipine (NORVASC) 10 MG tablet Take 1 tablet (10 mg total) by mouth daily. At bedtime 90 tablet 3  . aspirin EC 81 MG tablet Take 1 tablet (81 mg total) by mouth daily. 90 tablet 3  . carvedilol (COREG) 3.125 MG tablet Take 1 tablet (3.125 mg total) by mouth 2 (two) times daily with a meal. 180 tablet 3  . Certolizumab Pegol (CIMZIA) 2 X 200 MG KIT Inject 400 mg into the skin. Inject 400 mg at weeks 0,2,4, then every 4 weeks. Administered in office.    . cholecalciferol (VITAMIN D3) 25 MCG (1000 UT) tablet Take 2,000 Units by mouth daily.    . clonazePAM (KLONOPIN) 0.5 MG tablet TAKE 1 TABLET BY MOUTH AT BEDTIME. MAY TAKE 2 TIMES DAILY AS NEEDED ON OCCASION 60 tablet 2  . Cyanocobalamin (VITAMIN B 12 PO) Take 1 tablet by mouth daily.    . cycloSPORINE (RESTASIS) 0.05 % ophthalmic emulsion Place 1 drop into both eyes 2 (two) times daily.     . famotidine  (PEPCID) 20 MG tablet Take 1 tablet (20 mg total) by mouth daily. 30 tablet 5  . folic acid (FOLVITE) 1 MG tablet Take 2 mg by mouth daily.    Marland Kitchen  hydrochlorothiazide (HYDRODIURIL) 12.5 MG tablet Take 1 Tablet Daily 30 tablet 3  . ibuprofen (ADVIL,MOTRIN) 200 MG tablet Take 600 mg by mouth 2 (two) times daily as needed for mild pain.    Marland Kitchen irbesartan (AVAPRO) 300 MG tablet Take 1 tablet (300 mg total) by mouth daily. 30 tablet 11  . levothyroxine (SYNTHROID) 112 MCG tablet Take 1 tablet (112 mcg total) by mouth daily. 90 tablet 1  . methocarbamol (ROBAXIN-750) 750 MG tablet Take 1 tablet (750 mg total) by mouth every 8 (eight) hours as needed for muscle spasms. 30 tablet 0  . Multiple Vitamins-Minerals (PRESERVISION AREDS) TABS Take 1 tablet by mouth 2 (two) times daily.    . predniSONE (DELTASONE) 5 MG tablet Take 4 tabs po x 4 days, 3  tabs po x 4 days, 2  tabs po x 4 days, 1  tab po x 4 days 40 tablet 0  . rosuvastatin (CRESTOR) 10 MG tablet Take 1 tablet (10 mg total) by mouth daily. 30 tablet 5  . sertraline (ZOLOFT) 100 MG tablet TAKE 2 TABLETS(200 MG) BY MOUTH DAILY 180 tablet 1   No current facility-administered medications on file prior to visit.    Review of Systems:  As per HPI- otherwise negative.   Physical Examination: There were no vitals filed for this visit. There were no vitals filed for this visit. There is no height or weight on file to calculate BMI. Ideal Body Weight:    Patient obtained vital signs at home 153.4lb Sat 96% Pulse 82 127/91 tmax 99 Connected with pt via video-observed.  She looks well, her normal self She notes that her cervical nodes are a bit tender  She was not able to find a flashlight for me to examine her throat remotely   Assessment and Plan: Pharyngitis, unspecified etiology - Plan: amoxicillin (AMOXIL) 500 MG capsule  Patient here today to discuss pharyngitis.  She likely has a viral illness, but is concerned that she will not be able  to get her rheumatoid arthritis infusion if there is concern of acute illness.  We decided to treat her with amoxicillin at strep dosage.  I have asked her to let me know if not feeling better in the next couple of days, sooner if getting worse Video used for entirety of visit today Signed Lamar Blinks, MD

## 2020-05-19 ENCOUNTER — Ambulatory Visit: Payer: Medicare Other

## 2020-05-23 ENCOUNTER — Other Ambulatory Visit: Payer: Self-pay

## 2020-05-23 ENCOUNTER — Ambulatory Visit (INDEPENDENT_AMBULATORY_CARE_PROVIDER_SITE_OTHER): Payer: Medicare Other | Admitting: Family Medicine

## 2020-05-23 ENCOUNTER — Encounter: Payer: Self-pay | Admitting: Family Medicine

## 2020-05-23 VITALS — BP 140/80 | HR 85 | Temp 98.5°F | Resp 18 | Ht 61.0 in | Wt 156.0 lb

## 2020-05-23 DIAGNOSIS — J029 Acute pharyngitis, unspecified: Secondary | ICD-10-CM

## 2020-05-23 LAB — POCT RAPID STREP A (OFFICE): Rapid Strep A Screen: NEGATIVE

## 2020-05-23 MED ORDER — MONTELUKAST SODIUM 10 MG PO TABS: 10.0000 mg | ORAL_TABLET | Freq: Every day | ORAL | 3 refills | Status: AC

## 2020-05-23 MED ORDER — PANTOPRAZOLE SODIUM 40 MG PO TBEC: 40.0000 mg | DELAYED_RELEASE_TABLET | Freq: Every day | ORAL | 3 refills | Status: AC

## 2020-05-23 NOTE — Patient Instructions (Addendum)
Your strep is negative today Please get tested for covid 19 in the next day or so to make sure this is not the culprit Try taking protonix for a month or so- you can continue your famotidine as well We can also try singulair for possible allergies If neither of these medications seems to help you don't need to continue long term - this is something we are trying   Your ENT is Dr Janace Hoard- please give him a call at [phone]

## 2020-05-23 NOTE — Progress Notes (Signed)
[place] at [facility] [address] [place], [place] [address] [phone]  Date:  05/23/2020   Name:  Katherine Wang   DOB:  07/06/1946   MRN:  [id number]  PCP:  [name], MD    Chief Complaint: Sore Throat (10 days, 99.6 temp, acid reflux, often chokes on food or drink, trouble swallowing)   History of Present Illness:  Katherine Wang is a 74 y.o. very pleasant female patient who presents with the following:  History of rheumatoid arthritis, hypothyroidism, hypertension, aortic valve disorder and regurgitation,melanoma Here today with concern of persistent ST We did a virtual visit last week on 7/21- I started her on amox for possible bacterial pharyngitis. At that time she noted a sore throat for about 4 days She feels like she is now brining up some material from her chest Her ST however continues She feels like she will "choke very easily if I am drinking something or even on my own saliva" She feels like there is a lump in her throat She may feel like something "goes down the wrong way" on occasion but it does not sound like food is getting lodged in her esophagus   She notes a low grade fever in the evenings- last night her temp was "99.something." 99.6 tmax   She is seeing pulmonology in August - she had a lung CT about 10 days ago and had the following report: IMPRESSION: 1. No evidence of interstitial lung disease. 2. Minimal basilar air trapping, indicative of small airways disease. 3. Right lower lobe ground-glass nodule, slightly more conspicuous than on prior exams. No solid components. Adenocarcinoma cannot be excluded. Follow up by CT is recommended in 12 months, with continued annual surveillance for a minimum of 3 years. These recommendations are taken from: Recommendations for the Management of Subsolid Pulmonary Nodules Detected at CT: A Statement from the [facility] 2013;  266:1, 304-317. 4. Ascending Aortic aneurysm NOS (ICD10-I71.9). Recommend annual imaging followup by CTA or MRA. This recommendation follows 2010 ACCF/AHA/AATS/ACR/ASA/SCA/SCAI/SIR/STS/SVM Guidelines for the Diagnosis and Management of Patients with Thoracic Aortic Disease. Circulation. 2010; 121: E952-W413. Aortic aneurysm NOS (ICD10-I71.9). 5. Aortic atherosclerosis (ICD10-I70.0). Coronary artery calcification. 6.  Emphysema (ICD10-J43.9).   Her aortic aneurysm is already being followed by cardiology She does get reflux sx but is taking her H2 blocker daily She has cut down on acidic foods and coffee to help with these sx  She is taking zyrtec daily now as well Patient Active Problem List   Diagnosis Date Noted  . Chest pain 04/04/2019  . Anxiety 04/04/2019  . Leukopenia 04/04/2019  . Pneumonia 04/04/2019  . Dyspnea on exertion   . Moderate aortic regurgitation 11/02/2017  . Lightheadedness 11/02/2017  . Tachycardia 07/26/2017  . Atypical chest pain 07/26/2017  . High risk medications (not anticoagulants) long-term use 01/16/2017  . High risk medication use 12/19/2016  . Baker's cyst of knee, left 12/19/2016  . Pain in both knees 12/19/2016  . Pain of both elbows 12/19/2016  . Malignant melanoma ([place]) 12/19/2016  . Basal cell carcinoma 12/19/2016  . Chronic pansinusitis 08/15/2016  . Deviated septum 08/15/2016  . Epistaxis 08/15/2016  . Hypertrophy, nasal, turbinate 08/15/2016  . Upper airway cough syndrome 04/25/2016  . Hx of adenomatous colonic polyps 06/21/2015  . Hyperlipidemia 11/15/2014  . Rheumatoid arthritis ([place]) 09/01/2014  . Pericarditis 04/24/2011  . Aortic valve disorder 02/14/2009  . ELECTROCARDIOGRAM, ABNORMAL 02/14/2009  .  PERNICIOUS ANEMIA 07/10/2007  . DEPRESSION 07/03/2007  . MALAISE AND FATIGUE 07/03/2007  . Hypothyroidism 04/30/2007  . Essential hypertension 04/30/2007  . Aneurysm of thoracic aorta ([place]) 02/13/2006    Past Medical History:   Diagnosis Date  . Adenomatous colon polyp 2012  . Allergy   . Anxiety   . Aortic insufficiency   . Arthritis   . Basal cell carcinoma   . Cataract   . Depression   . Diverticulosis   . Fibromyalgia   . Fibromyalgia   . Hyperlipidemia   . Hypertension   . Macular degeneration   . Melanoma ([place])   . Murmur, heart   . Reflux   . Stroke ([place]) 20 years ago   per pt. mild stroke  . Thyroid disease    hypothroidism     Past Surgical History:  Procedure Laterality Date  . ABDOMINAL HYSTERECTOMY    . APPENDECTOMY    . BASAL CELL CARCINOMA EXCISION     nose   . CARDIAC CATHETERIZATION    . CHOLECYSTECTOMY    . COLONOSCOPY     30 + years ago, unsure of where  . EYE SURGERY    . LASIK Bilateral   . MELANOMA EXCISION    . NASAL SEPTUM SURGERY    . TUBAL LIGATION      Social History   Tobacco Use  . Smoking status: Former Smoker    Packs/day: 2.00    Years: 20.00    Pack years: 40.00    Types: Cigarettes    Quit date: 10/30/1983    Years since quitting: 36.5  . Smokeless tobacco: Never Used  Vaping Use  . Vaping Use: Never used  Substance Use Topics  . Alcohol use: No    Alcohol/week: 0.0 standard drinks  . Drug use: Never    Family History  Problem Relation Age of Onset  . Heart disease Mother   . Hypertension Mother   . Mental illness Mother   . Allergies Mother   . Heart disease Father   . Hypertension Father   . Mental illness Father   . Emphysema Father        smoked  . Allergies Father   . Heart attack Father   . Hyperlipidemia Sister   . Hypertension Sister   . Allergies Sister   . Cancer Sister        oral  . Hypertension Sister   . Diabetes Sister   . Allergies Sister   . Epilepsy Son   . Aneurysm Son   . Hyperthyroidism Daughter   . Hypothyroidism Daughter   . Hypertension Daughter   . Basal cell carcinoma Daughter   . Colon cancer Neg Hx   . Esophageal cancer Neg Hx   . Rectal cancer Neg Hx   . Stomach cancer Neg Hx   .  Pancreatic cancer Neg Hx   . Prostate cancer Neg Hx     Allergies  Allergen Reactions  . Tramadol Other (See Comments)    dizzy  . Statins     Body ache    Medication list has been reviewed and updated.  Current Outpatient Medications on File Prior to Visit  Medication Sig Dispense Refill  . albuterol (VENTOLIN HFA) 108 (90 Base) MCG/ACT inhaler Inhale 2 puffs into the lungs every 6 (six) hours as needed for wheezing or shortness of breath. 18 g 2  . Alirocumab (PRALUENT) 150 MG/ML SOAJ Inject 150 mg into the skin every 14 (fourteen) days. 2  pen 11  . amLODipine (NORVASC) 10 MG tablet Take 1 tablet (10 mg total) by mouth daily. At bedtime 90 tablet 3  . amoxicillin (AMOXIL) 500 MG capsule Take 1 capsule (500 mg total) by mouth 2 (two) times daily. 20 capsule 0  . aspirin EC 81 MG tablet Take 1 tablet (81 mg total) by mouth daily. 90 tablet 3  . carvedilol (COREG) 3.125 MG tablet Take 1 tablet (3.125 mg total) by mouth 2 (two) times daily with a meal. 180 tablet 3  . Certolizumab Pegol (CIMZIA) 2 X 200 MG KIT Inject 400 mg into the skin. Inject 400 mg at weeks 0,2,4, then every 4 weeks. Administered in office.    . cholecalciferol (VITAMIN D3) 25 MCG (1000 UT) tablet Take 2,000 Units by mouth daily.    . clonazePAM (KLONOPIN) 0.5 MG tablet TAKE 1 TABLET BY MOUTH AT BEDTIME. MAY TAKE 2 TIMES DAILY AS NEEDED ON OCCASION 60 tablet 2  . Cyanocobalamin (VITAMIN B 12 PO) Take 1 tablet by mouth daily.    . cycloSPORINE (RESTASIS) 0.05 % ophthalmic emulsion Place 1 drop into both eyes 2 (two) times daily.     . famotidine (PEPCID) 20 MG tablet Take 1 tablet (20 mg total) by mouth daily. 30 tablet 5  . hydrochlorothiazide (HYDRODIURIL) 12.5 MG tablet Take 1 Tablet Daily 30 tablet 3  . ibuprofen (ADVIL,MOTRIN) 200 MG tablet Take 600 mg by mouth 2 (two) times daily as needed for mild pain.    Marland Kitchen irbesartan (AVAPRO) 300 MG tablet Take 1 tablet (300 mg total) by mouth daily. 30 tablet 11  .  levothyroxine (SYNTHROID) 112 MCG tablet Take 1 tablet (112 mcg total) by mouth daily. 90 tablet 1  . Multiple Vitamins-Minerals (PRESERVISION AREDS) TABS Take 1 tablet by mouth 2 (two) times daily.    . rosuvastatin (CRESTOR) 10 MG tablet Take 1 tablet (10 mg total) by mouth daily. 30 tablet 5  . sertraline (ZOLOFT) 100 MG tablet TAKE 2 TABLETS(200 MG) BY MOUTH DAILY 180 tablet 1   No current facility-administered medications on file prior to visit.    Review of Systems:  As per HPI- otherwise negative.  Expected weight loss Wt Readings from Last 3 Encounters:  05/23/20 156 lb (70.8 kg)  04/28/20 157 lb (71.2 kg)  04/26/20 157 lb 3.2 oz (71.3 kg)    Physical Examination: Vitals:   05/23/20 1503  BP: (!) 140/80  Pulse: 85  Resp: 18  Temp: 98.5 F (36.9 C)  SpO2: 94%   Vitals:   05/23/20 1503  Weight: 156 lb (70.8 kg)  Height: _0  (1.549 m)   Body mass index is 29.48 kg/m. Ideal Body Weight: Weight in (lb) to have BMI = 25: 132  GEN: no acute distress. Mild overweight, looks well and her normal self HEENT: Atraumatic, Normocephalic. Bilateral TM wnl, oropharynx shows erythematous but nonswollen uvula, no exudate.  PEERL,EOMI.   Ears and Nose: No external deformity. CV: RRR, No M/G/R. No JVD. No thrill. No extra heart sounds. PULM: CTA B, no wheezes, crackles, rhonchi. No retractions. No resp. distress. No accessory muscle use. ABD: S, NT, ND, +BS. No rebound. No HSM. EXTR: No c/c/e PSYCH: Normally interactive. Conversant.   Results for orders placed or performed in visit on 05/23/20  POCT rapid strep A  Result Value Ref Range   Rapid Strep A Screen Negative Negative    Assessment and Plan: Pharyngitis, unspecified etiology - Plan: POCT rapid strep A, pantoprazole (PROTONIX) 40 MG  tablet, montelukast (SINGULAIR) 10 MG tablet  Patient here today with persistent pharyngitis. She is taking amoxicillin which unfortunately does not seem to be helping. A rapid strep  today is negative  We suspect this may be a viral etiology. I did ask her to get tested for COVID-19 but that this is less likely as she is vaccinated-she agrees with plan to be tested ASAP Assuming is not Covid, will try adding a PPI and/or Singulair to her regimen to see if either may be helpful. Differential also includes reflux or allergies  I have reassured her that I do not think this is a bacterial infection. Asked that she keep me posted about her progress, we hope she will feel better soon  This visit occurred during the SARS-CoV-2 public health emergency.  Safety protocols were in place, including screening questions prior to the visit, additional usage of staff PPE, and extensive cleaning of exam room while observing appropriate contact time as indicated for disinfecting solutions.     Signed Lamar Blinks, MD

## 2020-05-24 NOTE — Progress Notes (Deleted)
Office Visit Note  Patient: Katherine Wang             Date of Birth: 07-03-1946           MRN: [id number]             PCP: [name], MD Referring: [name], MD Visit Date: 06/07/2020 Occupation: @GUAROCC @  Subjective:  No chief complaint on file.   History of Present Illness: Katherine Wang is a 74 y.o. female ***   Activities of Daily Living:  Patient reports morning stiffness for *** {minute/hour:19697}.   Patient {ACTIONS;DENIES/REPORTS:21021675::"Denies"} nocturnal pain.  Difficulty dressing/grooming: {ACTIONS;DENIES/REPORTS:21021675::"Denies"} Difficulty climbing stairs: {ACTIONS;DENIES/REPORTS:21021675::"Denies"} Difficulty getting out of chair: {ACTIONS;DENIES/REPORTS:21021675::"Denies"} Difficulty using hands for taps, buttons, cutlery, and/or writing: {ACTIONS;DENIES/REPORTS:21021675::"Denies"}  No Rheumatology ROS completed.   PMFS History:  Patient Active Problem List   Diagnosis Date Noted  . Chest pain 04/04/2019  . Anxiety 04/04/2019  . Leukopenia 04/04/2019  . Pneumonia 04/04/2019  . Dyspnea on exertion   . Moderate aortic regurgitation 11/02/2017  . Lightheadedness 11/02/2017  . Tachycardia 07/26/2017  . Atypical chest pain 07/26/2017  . High risk medications (not anticoagulants) long-term use 01/16/2017  . High risk medication use 12/19/2016  . Baker's cyst of knee, left 12/19/2016  . Pain in both knees 12/19/2016  . Pain of both elbows 12/19/2016  . Malignant melanoma ([place]) 12/19/2016  . Basal cell carcinoma 12/19/2016  . Chronic pansinusitis 08/15/2016  . Deviated septum 08/15/2016  . Epistaxis 08/15/2016  . Hypertrophy, nasal, turbinate 08/15/2016  . Upper airway cough syndrome 04/25/2016  . Hx of adenomatous colonic polyps 06/21/2015  . Hyperlipidemia 11/15/2014  . Rheumatoid arthritis ([place]) 09/01/2014  . Pericarditis 04/24/2011  . Aortic valve disorder 02/14/2009  . ELECTROCARDIOGRAM, ABNORMAL 02/14/2009  . PERNICIOUS  ANEMIA 07/10/2007  . DEPRESSION 07/03/2007  . MALAISE AND FATIGUE 07/03/2007  . Hypothyroidism 04/30/2007  . Essential hypertension 04/30/2007  . Aneurysm of thoracic aorta ([place]) 02/13/2006    Past Medical History:  Diagnosis Date  . Adenomatous colon polyp 2012  . Allergy   . Anxiety   . Aortic insufficiency   . Arthritis   . Basal cell carcinoma   . Cataract   . Depression   . Diverticulosis   . Fibromyalgia   . Fibromyalgia   . Hyperlipidemia   . Hypertension   . Macular degeneration   . Melanoma ([place])   . Murmur, heart   . Reflux   . Stroke ([place]) 20 years ago   per pt. mild stroke  . Thyroid disease    hypothroidism     Family History  Problem Relation Age of Onset  . Heart disease Mother   . Hypertension Mother   . Mental illness Mother   . Allergies Mother   . Heart disease Father   . Hypertension Father   . Mental illness Father   . Emphysema Father        smoked  . Allergies Father   . Heart attack Father   . Hyperlipidemia Sister   . Hypertension Sister   . Allergies Sister   . Cancer Sister        oral  . Hypertension Sister   . Diabetes Sister   . Allergies Sister   . Epilepsy Son   . Aneurysm Son   . Hyperthyroidism Daughter   . Hypothyroidism Daughter   . Hypertension Daughter   . Basal cell carcinoma Daughter   . Colon cancer Neg Hx   .  Esophageal cancer Neg Hx   . Rectal cancer Neg Hx   . Stomach cancer Neg Hx   . Pancreatic cancer Neg Hx   . Prostate cancer Neg Hx    Past Surgical History:  Procedure Laterality Date  . ABDOMINAL HYSTERECTOMY    . APPENDECTOMY    . BASAL CELL CARCINOMA EXCISION     nose   . CARDIAC CATHETERIZATION    . CHOLECYSTECTOMY    . COLONOSCOPY     30 + years ago, unsure of where  . EYE SURGERY    . LASIK Bilateral   . MELANOMA EXCISION    . NASAL SEPTUM SURGERY    . TUBAL LIGATION     Social History   Social History Narrative   Lives alone in a one story home.  Has 2 living children.  Retired  Engineer, water.     Immunization History  Administered Date(s) Administered  . Influenza, High Dose Seasonal PF 08/16/2016, 12/12/2017, 09/22/2018  . Influenza,inj,Quad PF,6+ Mos 06/30/2019  . PFIZER SARS-COV-2 Vaccination 12/25/2019, 01/19/2020  . Pneumococcal Conjugate-13 09/01/2014  . Pneumococcal Polysaccharide-23 12/12/2017  . Td 11/29/2006     Objective: Vital Signs: There were no vitals taken for this visit.   Physical Exam   Musculoskeletal Exam: ***  CDAI Exam: CDAI Score: -- Patient Global: --; Provider Global: -- Swollen: --; Tender: -- Joint Exam 06/07/2020   No joint exam has been documented for this visit   There is currently no information documented on the homunculus. Go to the Rheumatology activity and complete the homunculus joint exam.  Investigation: No additional findings.  Imaging: DG Thoracic Spine 2 View  Result Date: 04/28/2020 CLINICAL DATA:  Right mid spine pain, upper back pain for 1 month EXAM: THORACIC SPINE 2 VIEWS COMPARISON:  05/12/2010, 05/13/2020 FINDINGS: Frontal and lateral views of the thoracic spine demonstrate stable S shaped scoliosis, right convex in the midthoracic spine and left convex at the thoracolumbar junction. There are no acute displaced fractures. Multilevel spondylosis again noted, greatest at the T4-5, T5-6, and T6-7 levels. Paraspinal soft tissues are unremarkable. Prominent atherosclerosis of the thoracic aorta. IMPRESSION: 1. S-shaped scoliosis and upper thoracic spondylosis. No acute fracture. Electronically Signed   By: Randa Ngo M.D.   On: 04/28/2020 22:56   DG Lumbar Spine Complete  Result Date: 04/28/2020 CLINICAL DATA:  Right mid lower back pain for 1 month, no history of trauma EXAM: LUMBAR SPINE - COMPLETE 4+ VIEW COMPARISON:  05/12/2010 FINDINGS: Frontal, bilateral oblique, and lateral views of the lumbar spine are obtained. Five non-rib-bearing lumbar type vertebral bodies are identified, with right convex  scoliosis again seen centered at L2. No acute displaced fracture. Multilevel spondylosis most pronounced at L1-2 and L2-3, which has progressed since prior study. There is diffuse facet hypertrophy, which has progressed since prior study. Sacroiliac joints are normal. Diffuse vascular calcifications are seen throughout the aorta. IMPRESSION: 1. Progressive multilevel spondylosis and facet hypertrophy. 2. Continued right convex scoliosis. 3. No acute fracture. Electronically Signed   By: Randa Ngo M.D.   On: 04/28/2020 22:44   CT Chest High Resolution  Result Date: 05/11/2020 CLINICAL DATA:  Cough.  Rheumatoid arthritis. EXAM: CT CHEST WITHOUT CONTRAST TECHNIQUE: Multidetector CT imaging of the chest was performed following the standard protocol without intravenous contrast. High resolution imaging of the lungs, as well as inspiratory and expiratory imaging, was performed. COMPARISON:  04/13/2020, 06/17/2019. FINDINGS: Cardiovascular: Aberrant right subclavian artery. Atherosclerotic calcification of the aorta, aortic valve and coronary arteries.  Ascending aorta measures 4.0 cm. Heart size normal. No pericardial effusion. Mediastinum/Nodes: No pathologically enlarged mediastinal or axillary lymph nodes. Hilar regions are difficult to evaluate without IV contrast. Esophagus is unremarkable. Lungs/Pleura: Minimal biapical pleuroparenchymal scarring. Negative for subpleural reticulation, traction bronchiectasis/bronchiolectasis, ground-glass, architectural distortion or honeycombing. Mild centrilobular emphysema. 7 mm ground-glass nodule in the lateral right lower lobe (10/92) may be slightly more conspicuous than on comparison exams. No solid components. 4 mm ground-glass left upper lobe nodule (10/23), unchanged from 09/11/2006 and considered benign. Calcified granuloma in the left upper lobe. No pleural fluid. Airway is unremarkable. Minimal air trapping at the lung bases. Upper Abdomen: Visualized portions  of the liver and right adrenal gland are unremarkable. Thickening of the left adrenal gland. Visualized portions of the kidneys, spleen, pancreas, stomach and bowel are grossly unremarkable with the exception of a tiny hiatal hernia. Cholecystectomy. No upper abdominal adenopathy. Musculoskeletal: Degenerative changes in the spine. No worrisome lytic or sclerotic lesions. IMPRESSION: 1. No evidence of interstitial lung disease. 2. Minimal basilar air trapping, indicative of small airways disease. 3. Right lower lobe ground-glass nodule, slightly more conspicuous than on prior exams. No solid components. Adenocarcinoma cannot be excluded. Follow up by CT is recommended in 12 months, with continued annual surveillance for a minimum of 3 years. These recommendations are taken from: Recommendations for the Management of Subsolid Pulmonary Nodules Detected at CT: A Statement from the [facility] 2013; 266:1, 304-317. 4. Ascending Aortic aneurysm NOS (ICD10-I71.9). Recommend annual imaging followup by CTA or MRA. This recommendation follows 2010 ACCF/AHA/AATS/ACR/ASA/SCA/SCAI/SIR/STS/SVM Guidelines for the Diagnosis and Management of Patients with Thoracic Aortic Disease. Circulation. 2010; 121: K270-W237. Aortic aneurysm NOS (ICD10-I71.9). 5. Aortic atherosclerosis (ICD10-I70.0). Coronary artery calcification. 6.  Emphysema (ICD10-J43.9). Electronically Signed   By: Lorin Picket M.D.   On: 05/11/2020 16:31    Recent Labs: Lab Results  Component Value Date   WBC 4.2 04/26/2020   HGB 13.1 04/26/2020   PLT 308 04/26/2020   NA 135 04/26/2020   K 4.0 04/26/2020   CL 97 (L) 04/26/2020   CO2 30 04/26/2020   GLUCOSE 113 (H) 04/26/2020   BUN 12 04/26/2020   CREATININE 0.77 04/26/2020   BILITOT 0.5 04/26/2020   ALKPHOS 58 11/30/2019   AST 14 04/26/2020   ALT 11 04/26/2020   PROT 6.7 04/26/2020   ALBUMIN 3.5 11/30/2019   CALCIUM 9.8 04/26/2020   GFRAA 87 04/26/2020   QFTBGOLDPLUS NEGATIVE  03/16/2020    Speciality Comments: No specialty comments available.  Procedures:  No procedures performed Allergies: Tramadol and Statins   Assessment / Plan:     Visit Diagnoses: No diagnosis found.  Orders: No orders of the defined types were placed in this encounter.  No orders of the defined types were placed in this encounter.   Face-to-face time spent with patient was *** minutes. Greater than 50% of time was spent in counseling and coordination of care.  Follow-Up Instructions: No follow-ups on file.   Ofilia Neas, PA-C  Note - This record has been created using Dragon software.  Chart creation errors have been sought, but may not always  have been located. Such creation errors do not reflect on  the standard of medical care.

## 2020-05-25 ENCOUNTER — Other Ambulatory Visit: Payer: Self-pay

## 2020-05-25 DIAGNOSIS — Z20822 Contact with and (suspected) exposure to covid-19: Secondary | ICD-10-CM | POA: Diagnosis not present

## 2020-05-25 DIAGNOSIS — F32A Depression, unspecified: Secondary | ICD-10-CM

## 2020-05-25 DIAGNOSIS — Z03818 Encounter for observation for suspected exposure to other biological agents ruled out: Secondary | ICD-10-CM | POA: Diagnosis not present

## 2020-05-25 DIAGNOSIS — R058 Other specified cough: Secondary | ICD-10-CM

## 2020-05-25 MED ORDER — SERTRALINE HCL 100 MG PO TABS: ORAL_TABLET | ORAL | 3 refills | Status: AC

## 2020-05-25 MED ORDER — FAMOTIDINE 20 MG PO TABS: 20.0000 mg | ORAL_TABLET | Freq: Every day | ORAL | 3 refills | Status: AC

## 2020-05-26 ENCOUNTER — Telehealth: Payer: Self-pay | Admitting: *Deleted

## 2020-05-26 MED ORDER — DOXYCYCLINE HYCLATE 100 MG PO CAPS
100.0000 mg | ORAL_CAPSULE | Freq: Two times a day (BID) | ORAL | 0 refills | Status: AC
Start: 2020-05-26 — End: ?

## 2020-05-26 NOTE — Telephone Encounter (Signed)
Patient contacted the office stating she had her first Cimzia injection on 05/03/2020. Patient states for the past 2 weeks she has had a sore throat. Patient states it is hard for her to swallow and "it feels like a ball in my throat that I am trying to swallow over." Patient states she has also had a low grade fever. Patient states she has been to see her PCP and she was given a prescription for Amoxicillin which she has been on for over 1 week. Patient states she has had no improvement. Patient states she has had a negative Covid test and a negative strep test. Patient states "my voice box make a snoring, tremor like sound." Patient would like to know if this may be a result of RA.  Please advise.

## 2020-05-26 NOTE — Telephone Encounter (Signed)
I returned patient's call.  She states her rheumatoid arthritis is much improved after Cimzia injection.  She continues to have sore throat for the last 2 weeks.  She has taken amoxicillin which did not help.  Have advised her to schedule a follow-up appointment for evaluation.  As her immune system is suppressed she may not have a good response to the antibiotics.  I also emphasized that despite of COVID-19 vaccination she should continue with the use of mask , hand hygiene and social distancing.  If she develops COVID-19 infection she will require antibody infusion.

## 2020-05-26 NOTE — Addendum Note (Signed)
Addended by: Lamar Blinks C on: 05/26/2020 01:07 PM   Modules accepted: Orders

## 2020-05-27 ENCOUNTER — Telehealth: Payer: Self-pay | Admitting: Family Medicine

## 2020-05-27 NOTE — Telephone Encounter (Signed)
Called and spoke to her, went over my recommendations

## 2020-05-27 NOTE — Telephone Encounter (Signed)
Caller: Lavender Call back # [phone]  Patients states she is vomiting the medicine (doxycycline). Patient would like a phone call back.

## 2020-05-28 ENCOUNTER — Emergency Department
Admission: EM | Admit: 2020-05-28 | Discharge: 2020-05-28 | Disposition: A | Discharge status: Home / Self Care | Payer: Medicare Other | Attending: Emergency Medicine | Admitting: Emergency Medicine

## 2020-05-28 ENCOUNTER — Other Ambulatory Visit: Payer: Self-pay

## 2020-05-28 DIAGNOSIS — E876 Hypokalemia: Secondary | ICD-10-CM | POA: Diagnosis not present

## 2020-05-28 DIAGNOSIS — Z7982 Long term (current) use of aspirin: Secondary | ICD-10-CM | POA: Insufficient documentation

## 2020-05-28 DIAGNOSIS — E039 Hypothyroidism, unspecified: Secondary | ICD-10-CM | POA: Insufficient documentation

## 2020-05-28 DIAGNOSIS — I10 Essential (primary) hypertension: Secondary | ICD-10-CM | POA: Diagnosis not present

## 2020-05-28 DIAGNOSIS — J029 Acute pharyngitis, unspecified: Secondary | ICD-10-CM | POA: Diagnosis not present

## 2020-05-28 DIAGNOSIS — Z79899 Other long term (current) drug therapy: Secondary | ICD-10-CM | POA: Diagnosis not present

## 2020-05-28 DIAGNOSIS — F17210 Nicotine dependence, cigarettes, uncomplicated: Secondary | ICD-10-CM | POA: Diagnosis not present

## 2020-05-28 LAB — CBC WITH DIFFERENTIAL/PLATELET
Abs Immature Granulocytes: 0.01 10*3/uL (ref 0.00–0.07)
Basophils Absolute: 0 10*3/uL (ref 0.0–0.1)
Basophils Relative: 1 %
Eosinophils Absolute: 0 10*3/uL (ref 0.0–0.5)
Eosinophils Relative: 1 %
HCT: 33.9 % — ABNORMAL LOW (ref 36.0–46.0)
Hemoglobin: 11.4 g/dL — ABNORMAL LOW (ref 12.0–15.0)
Immature Granulocytes: 0 %
Lymphocytes Relative: 36 %
Lymphs Abs: 0.8 10*3/uL (ref 0.7–4.0)
MCH: 33.2 pg (ref 26.0–34.0)
MCHC: 33.6 g/dL (ref 30.0–36.0)
MCV: 98.8 fL (ref 80.0–100.0)
Monocytes Absolute: 0.3 10*3/uL (ref 0.1–1.0)
Monocytes Relative: 13 %
Neutro Abs: 1.1 10*3/uL — ABNORMAL LOW (ref 1.7–7.7)
Neutrophils Relative %: 49 %
Platelets: 176 10*3/uL (ref 150–400)
RBC: 3.43 MIL/uL — ABNORMAL LOW (ref 3.87–5.11)
RDW: 13.5 % (ref 11.5–15.5)
Smear Review: NORMAL
WBC Morphology: INCREASED
WBC: 2.3 10*3/uL — ABNORMAL LOW (ref 4.0–10.5)
nRBC: 0 % (ref 0.0–0.2)

## 2020-05-28 LAB — COMPREHENSIVE METABOLIC PANEL
ALT: 10 U/L (ref 0–44)
AST: 18 U/L (ref 15–41)
Albumin: 3.6 g/dL (ref 3.5–5.0)
Alkaline Phosphatase: 67 U/L (ref 38–126)
Anion gap: 13 (ref 5–15)
BUN: 13 mg/dL (ref 8–23)
CO2: 25 mmol/L (ref 22–32)
Calcium: 9 mg/dL (ref 8.9–10.3)
Chloride: 95 mmol/L — ABNORMAL LOW (ref 98–111)
Creatinine, Ser: 0.68 mg/dL (ref 0.44–1.00)
GFR calc Af Amer: >60 mL/min (ref >60)
GFR calc non Af Amer: >60 mL/min (ref >60)
Glucose, Bld: 111 mg/dL — ABNORMAL HIGH (ref 70–99)
Potassium: 3 mmol/L — ABNORMAL LOW (ref 3.5–5.1)
Sodium: 133 mmol/L — ABNORMAL LOW (ref 135–145)
Total Bilirubin: 1.3 mg/dL — ABNORMAL HIGH (ref 0.3–1.2)
Total Protein: 7.3 g/dL (ref 6.5–8.1)

## 2020-05-28 MED ORDER — LIDOCAINE VISCOUS HCL 2 % MT SOLN
7.5000 mL | Freq: Once | OROMUCOSAL | Status: AC
  Administered 2020-05-28: 7.5 mL via OROMUCOSAL
  Filled 2020-05-28: qty 15

## 2020-05-28 MED ORDER — ONDANSETRON 4 MG PO TBDP: 4.0000 mg | ORAL_TABLET | Freq: Three times a day (TID) | ORAL | 0 refills | Status: AC | PRN

## 2020-05-28 MED ORDER — MAGIC MOUTHWASH
ORAL | 0 refills | Status: AC
Start: 2020-05-28 — End: ?

## 2020-05-28 MED ORDER — ONDANSETRON 4 MG PO TBDP
4.0000 mg | ORAL_TABLET | Freq: Once | ORAL | Status: AC
  Administered 2020-05-28: 4 mg via ORAL
  Filled 2020-05-28: qty 1

## 2020-05-28 MED ORDER — POTASSIUM CHLORIDE 20 MEQ PO PACK: 20.0000 meq | PACK | Freq: Once | ORAL | 0 refills | Status: AC

## 2020-05-28 NOTE — ED Provider Notes (Signed)
[facility] Emergency Department Provider Note  ____________________________________________   First MD Initiated Contact with Patient 05/28/20 1319     (approximate)  I have reviewed the triage vital signs and the nursing notes.   HISTORY  Chief Complaint Sore Throat   HPI RENAI Wang is a 74 y.o. female presents to the ED with complaint of sore throat for over 3 weeks.  Patient states that she has been seen by her PCP and tested for strep and Covid both of which were negative.  Patient states that she has had a prescription for amoxicillin and also doxycycline without any relief.  She states that she has an appointment with ENT on 8/14.  She states that her throat is painful and cannot wait till the 14th.  She is having nausea with some vomiting.  She reports fever at home.  Patient currently is taking Pepcid for reflux 20 mg 1 daily.  She states even with this medication she continues to have reflux her med list also includes Protonix 40 mg daily.  She reports that her reflux has gotten worse in the last several weeks.  Patient denies any trauma to her throat and was told initially that this was from a sinus infection.  She rates her pain as 9 out of 10.       Past Medical History:  Diagnosis Date  . Adenomatous colon polyp 2012  . Allergy   . Anxiety   . Aortic insufficiency   . Arthritis   . Basal cell carcinoma   . Cataract   . Depression   . Diverticulosis   . Fibromyalgia   . Fibromyalgia   . Hyperlipidemia   . Hypertension   . Macular degeneration   . Melanoma ([place])   . Murmur, heart   . Reflux   . Stroke ([place]) 20 years ago   per pt. mild stroke  . Thyroid disease    hypothroidism     Patient Active Problem List   Diagnosis Date Noted  . Chest pain 04/04/2019  . Anxiety 04/04/2019  . Leukopenia 04/04/2019  . Pneumonia 04/04/2019  . Dyspnea on exertion   . Moderate aortic regurgitation 11/02/2017  . Lightheadedness 11/02/2017    . Tachycardia 07/26/2017  . Atypical chest pain 07/26/2017  . High risk medications (not anticoagulants) long-term use 01/16/2017  . High risk medication use 12/19/2016  . Baker's cyst of knee, left 12/19/2016  . Pain in both knees 12/19/2016  . Pain of both elbows 12/19/2016  . Malignant melanoma ([place]) 12/19/2016  . Basal cell carcinoma 12/19/2016  . Chronic pansinusitis 08/15/2016  . Deviated septum 08/15/2016  . Epistaxis 08/15/2016  . Hypertrophy, nasal, turbinate 08/15/2016  . Upper airway cough syndrome 04/25/2016  . Hx of adenomatous colonic polyps 06/21/2015  . Hyperlipidemia 11/15/2014  . Rheumatoid arthritis ([place]) 09/01/2014  . Pericarditis 04/24/2011  . Aortic valve disorder 02/14/2009  . ELECTROCARDIOGRAM, ABNORMAL 02/14/2009  . PERNICIOUS ANEMIA 07/10/2007  . DEPRESSION 07/03/2007  . MALAISE AND FATIGUE 07/03/2007  . Hypothyroidism 04/30/2007  . Essential hypertension 04/30/2007  . Aneurysm of thoracic aorta ([place]) 02/13/2006    Past Surgical History:  Procedure Laterality Date  . ABDOMINAL HYSTERECTOMY    . APPENDECTOMY    . BASAL CELL CARCINOMA EXCISION     nose   . CARDIAC CATHETERIZATION    . CHOLECYSTECTOMY    . COLONOSCOPY     30 + years ago, unsure of where  . EYE SURGERY    .  LASIK Bilateral   . MELANOMA EXCISION    . NASAL SEPTUM SURGERY    . TUBAL LIGATION      Prior to Admission medications   Medication Sig Start Date End Date Taking? Authorizing Provider  albuterol (VENTOLIN HFA) 108 (90 Base) MCG/ACT inhaler Inhale 2 puffs into the lungs every 6 (six) hours as needed for wheezing or shortness of breath. 04/11/20   Copland, Gay Filler, MD  Alirocumab (PRALUENT) 150 MG/ML SOAJ Inject 150 mg into the skin every 14 (fourteen) days. 05/13/20   Larey Dresser, MD  amLODipine (NORVASC) 10 MG tablet Take 1 tablet (10 mg total) by mouth daily. At bedtime 10/27/19   Copland, Gay Filler, MD  aspirin EC 81 MG tablet Take 1 tablet (81 mg total) by mouth  daily. 04/20/16   Larey Dresser, MD  carvedilol (COREG) 3.125 MG tablet Take 1 tablet (3.125 mg total) by mouth 2 (two) times daily with a meal. 02/23/20   Larey Dresser, MD  Certolizumab Pegol (CIMZIA) 2 X 200 MG KIT Inject 400 mg into the skin. Inject 400 mg at weeks 0,2,4, then every 4 weeks. Administered in office.    [provider]  cholecalciferol (VITAMIN D3) 25 MCG (1000 UT) tablet Take 2,000 Units by mouth daily.    [provider]  clonazePAM (KLONOPIN) 0.5 MG tablet TAKE 1 TABLET BY MOUTH AT BEDTIME. MAY TAKE 2 TIMES DAILY AS NEEDED ON OCCASION 05/16/20   Copland, Gay Filler, MD  Cyanocobalamin (VITAMIN B 12 PO) Take 1 tablet by mouth daily.    [provider]  cycloSPORINE (RESTASIS) 0.05 % ophthalmic emulsion Place 1 drop into both eyes 2 (two) times daily.     [provider]  doxycycline (VIBRAMYCIN) 100 MG capsule Take 1 capsule (100 mg total) by mouth 2 (two) times daily. 05/26/20   Copland, Gay Filler, MD  famotidine (PEPCID) 20 MG tablet Take 1 tablet (20 mg total) by mouth daily. 05/25/20   Copland, Gay Filler, MD  hydrochlorothiazide (HYDRODIURIL) 12.5 MG tablet Take 1 Tablet Daily 09/07/19   Larey Dresser, MD  ibuprofen (ADVIL,MOTRIN) 200 MG tablet Take 600 mg by mouth 2 (two) times daily as needed for mild pain.    [provider]  irbesartan (AVAPRO) 300 MG tablet Take 1 tablet (300 mg total) by mouth daily. 06/01/19   Larey Dresser, MD  levothyroxine (SYNTHROID) 112 MCG tablet Take 1 tablet (112 mcg total) by mouth daily. 04/26/20   Copland, Gay Filler, MD  magic mouthwash SOLN 2 tsp ac and hs  Swish and spit 05/28/20   Letitia Neri L, PA-C  montelukast (SINGULAIR) 10 MG tablet Take 1 tablet (10 mg total) by mouth at bedtime. 05/23/20   Copland, Gay Filler, MD  Multiple Vitamins-Minerals (PRESERVISION AREDS) TABS Take 1 tablet by mouth 2 (two) times daily.    [provider]  ondansetron (ZOFRAN ODT) 4 MG disintegrating  tablet Take 1 tablet (4 mg total) by mouth every 8 (eight) hours as needed for nausea or vomiting. 05/28/20   Johnn Hai, PA-C  pantoprazole (PROTONIX) 40 MG tablet Take 1 tablet (40 mg total) by mouth daily. 05/23/20   Copland, Gay Filler, MD  potassium chloride (KLOR-CON) 20 MEQ packet Take 20 mEq by mouth once for 1 dose. 05/28/20 05/28/20  Johnn Hai, PA-C  rosuvastatin (CRESTOR) 10 MG tablet Take 1 tablet (10 mg total) by mouth daily. 12/08/19   Larey Dresser, MD  sertraline (ZOLOFT)  100 MG tablet TAKE 2 TABLETS(200 MG) BY MOUTH DAILY 05/25/20   Copland, Gay Filler, MD    Allergies Tramadol and Statins  Family History  Problem Relation Age of Onset  . Heart disease Mother   . Hypertension Mother   . Mental illness Mother   . Allergies Mother   . Heart disease Father   . Hypertension Father   . Mental illness Father   . Emphysema Father        smoked  . Allergies Father   . Heart attack Father   . Hyperlipidemia Sister   . Hypertension Sister   . Allergies Sister   . Cancer Sister        oral  . Hypertension Sister   . Diabetes Sister   . Allergies Sister   . Epilepsy Son   . Aneurysm Son   . Hyperthyroidism Daughter   . Hypothyroidism Daughter   . Hypertension Daughter   . Basal cell carcinoma Daughter   . Colon cancer Neg Hx   . Esophageal cancer Neg Hx   . Rectal cancer Neg Hx   . Stomach cancer Neg Hx   . Pancreatic cancer Neg Hx   . Prostate cancer Neg Hx     Social History Social History   Tobacco Use  . Smoking status: Former Smoker    Packs/day: 2.00    Years: 20.00    Pack years: 40.00    Types: Cigarettes    Quit date: 10/30/1983    Years since quitting: 36.6  . Smokeless tobacco: Never Used  Vaping Use  . Vaping Use: Never used  Substance Use Topics  . Alcohol use: No    Alcohol/week: 0.0 standard drinks  . Drug use: Never    Review of Systems Constitutional: Positive fever/chills Eyes: No visual changes. ENT: Positive for sore  throat. Cardiovascular: Denies chest pain. Respiratory: Denies shortness of breath. Gastrointestinal: No abdominal pain.  Positive nausea, positive vomiting.  No diarrhea.   Musculoskeletal: Negative for muscle aches. Skin: Negative for rash. Neurological: Negative for headaches, focal weakness or numbness. ____________________________________________   PHYSICAL EXAM:  VITAL SIGNS: ED Triage Vitals  Enc Vitals Group     BP 05/28/20 1224 (!) 140/95     Pulse Rate 05/28/20 1224 (!) 109     Resp 05/28/20 1224 18     Temp 05/28/20 1224 98.6 F (37 C)     Temp Source 05/28/20 1224 Oral     SpO2 05/28/20 1224 97 %     Weight 05/28/20 1225 154 lb (69.9 kg)     Height 05/28/20 1225 5' (1.524 m)     Head Circumference --      Peak Flow --      Pain Score 05/28/20 1224 9     Pain Loc --      Pain Edu? --      Excl. in [place]? --     Constitutional: Alert and oriented. Well appearing and in no acute distress.  Patient is able to talk in complete sentences and does not appear to be in any distress.  Patient is able to maintain her own secretions without difficulty.  She reports pain with swallowing. Eyes: Conjunctivae are normal. PERRL. EOMI. Head: Atraumatic. Nose: No congestion/rhinnorhea.  No cervical lymphadenopathy Mouth/Throat: Mucous membranes are moist.  Oropharynx mild erythema and uvula is midline and slightly edematous but not touching the tongue or blocking her throat.  No exudate is noted along the buccal mucosa.  Patient is  able to swallow and maintain secretions. Neck: No stridor.   Cardiovascular: Normal rate, regular rhythm. Grossly normal heart sounds.  Good peripheral circulation. Respiratory: Normal respiratory effort.  No retractions. Lungs CTAB. Gastrointestinal: Soft and nontender. No distention.  Bowel sounds normoactive x4 quadrants. Neurologic:  Normal speech and language. No gross focal neurologic deficits are appreciated. No gait instability. Skin:  Skin is warm,  dry and intact. No rash noted. Psychiatric: Mood and affect are normal. Speech and behavior are normal.  ____________________________________________   LABS (all labs ordered are listed, but only abnormal results are displayed)  Labs Reviewed  CBC WITH DIFFERENTIAL/PLATELET - Abnormal; Notable for the following components:      Result Value   WBC 2.3 (*)    RBC 3.43 (*)    Hemoglobin 11.4 (*)    HCT 33.9 (*)    Neutro Abs 1.1 (*)    All other components within normal limits  COMPREHENSIVE METABOLIC PANEL - Abnormal; Notable for the following components:   Sodium 133 (*)    Potassium 3.0 (*)    Chloride 95 (*)    Glucose, Bld 111 (*)    Total Bilirubin 1.3 (*)    All other components within normal limits    PROCEDURES  Procedure(s) performed (including Critical Care):  Procedures   ____________________________________________   INITIAL IMPRESSION / ASSESSMENT AND PLAN / ED COURSE  As part of my medical decision making, I reviewed the following data within the electronic MEDICAL RECORD NUMBER Notes from prior ED visits and  Controlled Substance Database  74 year old female is brought to the ED by family with complaint of sore throat for the last 3 weeks.  Patient has been seen by her PCP and tested for strep and Covid which were both negative.  States that she has taken both amoxicillin and doxycycline without any relief.  She currently is taking 2 medications for reflux.  She has an appointment on 8/14 with an ENT for further evaluation..  Patient is afebrile in the ED.  White count suggest that this may be viral.  Patient had no nausea or vomiting after being given Zofran 4 mg ODT.  Patient was tolerating applesauce.  She was given viscous lidocaine to see if this helped with her throat pain.  A prescription for Magic mouthwash was written.  Potassium chloride 20 mEq pack was sent to her pharmacy 1 a day for the next 5 days and Zofran ODT 4 mg every 8 hours as needed for nausea  or vomiting.  Patient is encouraged to eat soft foods such as yogurt, ice cream or applesauce.  Also suggested was to crush her medication and place in her soft foods as needed.  ____________________________________________   FINAL CLINICAL IMPRESSION(S) / ED DIAGNOSES  Final diagnoses:  Viral pharyngitis  Hypokalemia     ED Discharge Orders         Ordered    potassium chloride (KLOR-CON) 20 MEQ packet   Once     Reprint     05/28/20 1617    magic mouthwash SOLN     Discontinue  Reprint    Note to Pharmacy: May substitute duke's mouth wash if neccessary   05/28/20 1617    ondansetron (ZOFRAN ODT) 4 MG disintegrating tablet  Every 8 hours PRN     Discontinue  Reprint     05/28/20 1617           Note:  This document was prepared using Dragon voice recognition software and may  include unintentional dictation errors.    Johnn Hai, PA-C 05/29/20 1464    Vladimir Crofts, MD 05/29/20 [phone]

## 2020-05-28 NOTE — Discharge Instructions (Addendum)
Keep your appointment with the ear, nose and throat doctor. Discontinue taking the doxycycline which is the antibiotic. The potassium should be in a packet instead of a pill.  This is once a day for the next 5 days.  Also a prescription for Zofran was sent to your pharmacy to place on your tongue as needed for nausea.  The Magic mouthwash that we discussed is a prescription that will not transfer therefore a printed copy of the prescription is in your discharge papers.  It may be necessary for you to eat soft foods such as applesauce, yogurt or ice cream until you are seen by the ENT doctor.  You may also crush pills to add to your applesauce or ice cream.

## 2020-05-28 NOTE — ED Triage Notes (Signed)
Pt arrives POV for reports of sore throat x 3 weeks. PT states she saw PCP where she was prescribed abx that "did not work". Covid test negative. Pt speaking with this RN in complete sentences without difficulty but does cough to clear her throat frequently. Pt alternating tylenol and motrin for pain. Pt ambulatory from lobby in NAD, skin warm and dry.

## 2020-05-28 NOTE — Telephone Encounter (Signed)
Patient seen 05/23/2020 for pharyngitis, patient given doxycycline (VIBRAMYCIN) 100 MG capsule [[id number]].

## 2020-05-28 NOTE — ED Notes (Signed)
See triage note  Presents with a 3 week hx of sore throat  Has been seen by PCP  Tested negative for strep and COVID  Has an upcoming appt with ENT on 08/14  States she is having a hard time swallowing  Afebrile on arrival

## 2020-05-31 ENCOUNTER — Other Ambulatory Visit: Payer: Self-pay

## 2020-05-31 ENCOUNTER — Inpatient Hospital Stay (HOSPITAL_COMMUNITY): Payer: Medicare Other

## 2020-05-31 ENCOUNTER — Emergency Department (HOSPITAL_COMMUNITY): Payer: Medicare Other

## 2020-05-31 ENCOUNTER — Inpatient Hospital Stay (HOSPITAL_COMMUNITY)
Admission: EM | Admit: 2020-05-31 | Discharge: 2020-06-29 | DRG: 152 | Disposition: E | Payer: Medicare Other | Attending: Pulmonary Disease | Admitting: Pulmonary Disease

## 2020-05-31 ENCOUNTER — Encounter (HOSPITAL_COMMUNITY): Payer: Self-pay | Admitting: Emergency Medicine

## 2020-05-31 DIAGNOSIS — M1990 Unspecified osteoarthritis, unspecified site: Secondary | ICD-10-CM | POA: Diagnosis present

## 2020-05-31 DIAGNOSIS — Z818 Family history of other mental and behavioral disorders: Secondary | ICD-10-CM

## 2020-05-31 DIAGNOSIS — Z20822 Contact with and (suspected) exposure to covid-19: Secondary | ICD-10-CM | POA: Diagnosis present

## 2020-05-31 DIAGNOSIS — R410 Disorientation, unspecified: Secondary | ICD-10-CM | POA: Diagnosis not present

## 2020-05-31 DIAGNOSIS — I4891 Unspecified atrial fibrillation: Secondary | ICD-10-CM | POA: Diagnosis not present

## 2020-05-31 DIAGNOSIS — E039 Hypothyroidism, unspecified: Secondary | ICD-10-CM | POA: Diagnosis not present

## 2020-05-31 DIAGNOSIS — Z9911 Dependence on respirator [ventilator] status: Secondary | ICD-10-CM

## 2020-05-31 DIAGNOSIS — B3781 Candidal esophagitis: Secondary | ICD-10-CM | POA: Diagnosis present

## 2020-05-31 DIAGNOSIS — D649 Anemia, unspecified: Secondary | ICD-10-CM | POA: Diagnosis not present

## 2020-05-31 DIAGNOSIS — H3530 Unspecified macular degeneration: Secondary | ICD-10-CM | POA: Diagnosis present

## 2020-05-31 DIAGNOSIS — R000 Tachycardia, unspecified: Secondary | ICD-10-CM | POA: Diagnosis present

## 2020-05-31 DIAGNOSIS — Z452 Encounter for adjustment and management of vascular access device: Secondary | ICD-10-CM | POA: Diagnosis not present

## 2020-05-31 DIAGNOSIS — E871 Hypo-osmolality and hyponatremia: Secondary | ICD-10-CM | POA: Diagnosis present

## 2020-05-31 DIAGNOSIS — F419 Anxiety disorder, unspecified: Secondary | ICD-10-CM | POA: Diagnosis not present

## 2020-05-31 DIAGNOSIS — J90 Pleural effusion, not elsewhere classified: Secondary | ICD-10-CM | POA: Diagnosis not present

## 2020-05-31 DIAGNOSIS — B370 Candidal stomatitis: Secondary | ICD-10-CM | POA: Diagnosis not present

## 2020-05-31 DIAGNOSIS — N179 Acute kidney failure, unspecified: Secondary | ICD-10-CM | POA: Diagnosis not present

## 2020-05-31 DIAGNOSIS — J9690 Respiratory failure, unspecified, unspecified whether with hypoxia or hypercapnia: Secondary | ICD-10-CM

## 2020-05-31 DIAGNOSIS — J690 Pneumonitis due to inhalation of food and vomit: Secondary | ICD-10-CM | POA: Diagnosis not present

## 2020-05-31 DIAGNOSIS — I119 Hypertensive heart disease without heart failure: Secondary | ICD-10-CM | POA: Diagnosis present

## 2020-05-31 DIAGNOSIS — J0431 Supraglottitis, unspecified, with obstruction: Secondary | ICD-10-CM | POA: Diagnosis not present

## 2020-05-31 DIAGNOSIS — Z85820 Personal history of malignant melanoma of skin: Secondary | ICD-10-CM

## 2020-05-31 DIAGNOSIS — M797 Fibromyalgia: Secondary | ICD-10-CM | POA: Diagnosis present

## 2020-05-31 DIAGNOSIS — Z87891 Personal history of nicotine dependence: Secondary | ICD-10-CM

## 2020-05-31 DIAGNOSIS — B9789 Other viral agents as the cause of diseases classified elsewhere: Secondary | ICD-10-CM | POA: Diagnosis present

## 2020-05-31 DIAGNOSIS — K121 Other forms of stomatitis: Secondary | ICD-10-CM | POA: Diagnosis present

## 2020-05-31 DIAGNOSIS — E785 Hyperlipidemia, unspecified: Secondary | ICD-10-CM | POA: Diagnosis present

## 2020-05-31 DIAGNOSIS — K1230 Oral mucositis (ulcerative), unspecified: Secondary | ICD-10-CM | POA: Diagnosis present

## 2020-05-31 DIAGNOSIS — Z820 Family history of epilepsy and other diseases of the nervous system: Secondary | ICD-10-CM

## 2020-05-31 DIAGNOSIS — D72810 Lymphocytopenia: Secondary | ICD-10-CM | POA: Diagnosis present

## 2020-05-31 DIAGNOSIS — I517 Cardiomegaly: Secondary | ICD-10-CM | POA: Diagnosis not present

## 2020-05-31 DIAGNOSIS — A419 Sepsis, unspecified organism: Secondary | ICD-10-CM | POA: Diagnosis not present

## 2020-05-31 DIAGNOSIS — E781 Pure hyperglyceridemia: Secondary | ICD-10-CM | POA: Diagnosis not present

## 2020-05-31 DIAGNOSIS — E162 Hypoglycemia, unspecified: Secondary | ICD-10-CM | POA: Diagnosis not present

## 2020-05-31 DIAGNOSIS — Z79890 Hormone replacement therapy (postmenopausal): Secondary | ICD-10-CM

## 2020-05-31 DIAGNOSIS — J0510 Acute epiglottitis without obstruction: Secondary | ICD-10-CM | POA: Diagnosis not present

## 2020-05-31 DIAGNOSIS — Z931 Gastrostomy status: Secondary | ICD-10-CM | POA: Diagnosis not present

## 2020-05-31 DIAGNOSIS — E873 Alkalosis: Secondary | ICD-10-CM | POA: Diagnosis not present

## 2020-05-31 DIAGNOSIS — E876 Hypokalemia: Secondary | ICD-10-CM | POA: Diagnosis not present

## 2020-05-31 DIAGNOSIS — M069 Rheumatoid arthritis, unspecified: Secondary | ICD-10-CM | POA: Diagnosis not present

## 2020-05-31 DIAGNOSIS — I10 Essential (primary) hypertension: Secondary | ICD-10-CM | POA: Diagnosis present

## 2020-05-31 DIAGNOSIS — E8770 Fluid overload, unspecified: Secondary | ICD-10-CM | POA: Diagnosis not present

## 2020-05-31 DIAGNOSIS — I442 Atrioventricular block, complete: Secondary | ICD-10-CM | POA: Diagnosis not present

## 2020-05-31 DIAGNOSIS — R0603 Acute respiratory distress: Secondary | ICD-10-CM

## 2020-05-31 DIAGNOSIS — Z978 Presence of other specified devices: Secondary | ICD-10-CM

## 2020-05-31 DIAGNOSIS — R0989 Other specified symptoms and signs involving the circulatory and respiratory systems: Secondary | ICD-10-CM | POA: Diagnosis not present

## 2020-05-31 DIAGNOSIS — R6521 Severe sepsis with septic shock: Secondary | ICD-10-CM | POA: Diagnosis not present

## 2020-05-31 DIAGNOSIS — Z79899 Other long term (current) drug therapy: Secondary | ICD-10-CM

## 2020-05-31 DIAGNOSIS — G9341 Metabolic encephalopathy: Secondary | ICD-10-CM | POA: Diagnosis not present

## 2020-05-31 DIAGNOSIS — Z83438 Family history of other disorder of lipoprotein metabolism and other lipidemia: Secondary | ICD-10-CM

## 2020-05-31 DIAGNOSIS — Z66 Do not resuscitate: Secondary | ICD-10-CM | POA: Diagnosis not present

## 2020-05-31 DIAGNOSIS — E861 Hypovolemia: Secondary | ICD-10-CM | POA: Diagnosis not present

## 2020-05-31 DIAGNOSIS — Z515 Encounter for palliative care: Secondary | ICD-10-CM | POA: Diagnosis not present

## 2020-05-31 DIAGNOSIS — Z8249 Family history of ischemic heart disease and other diseases of the circulatory system: Secondary | ICD-10-CM

## 2020-05-31 DIAGNOSIS — K209 Esophagitis, unspecified without bleeding: Secondary | ICD-10-CM | POA: Diagnosis present

## 2020-05-31 DIAGNOSIS — Z86010 Personal history of colonic polyps: Secondary | ICD-10-CM

## 2020-05-31 DIAGNOSIS — F329 Major depressive disorder, single episode, unspecified: Secondary | ICD-10-CM | POA: Diagnosis present

## 2020-05-31 DIAGNOSIS — J029 Acute pharyngitis, unspecified: Secondary | ICD-10-CM | POA: Diagnosis not present

## 2020-05-31 DIAGNOSIS — J189 Pneumonia, unspecified organism: Secondary | ICD-10-CM

## 2020-05-31 DIAGNOSIS — R911 Solitary pulmonary nodule: Secondary | ICD-10-CM | POA: Diagnosis present

## 2020-05-31 DIAGNOSIS — I9589 Other hypotension: Secondary | ICD-10-CM | POA: Diagnosis not present

## 2020-05-31 DIAGNOSIS — R05 Cough: Secondary | ICD-10-CM

## 2020-05-31 DIAGNOSIS — Z8619 Personal history of other infectious and parasitic diseases: Secondary | ICD-10-CM

## 2020-05-31 DIAGNOSIS — D849 Immunodeficiency, unspecified: Secondary | ICD-10-CM | POA: Diagnosis present

## 2020-05-31 DIAGNOSIS — K120 Recurrent oral aphthae: Secondary | ICD-10-CM | POA: Diagnosis present

## 2020-05-31 DIAGNOSIS — Z808 Family history of malignant neoplasm of other organs or systems: Secondary | ICD-10-CM

## 2020-05-31 DIAGNOSIS — Z4682 Encounter for fitting and adjustment of non-vascular catheter: Secondary | ICD-10-CM | POA: Diagnosis not present

## 2020-05-31 DIAGNOSIS — E8809 Other disorders of plasma-protein metabolism, not elsewhere classified: Secondary | ICD-10-CM | POA: Diagnosis not present

## 2020-05-31 DIAGNOSIS — Z7982 Long term (current) use of aspirin: Secondary | ICD-10-CM

## 2020-05-31 DIAGNOSIS — J9601 Acute respiratory failure with hypoxia: Secondary | ICD-10-CM | POA: Diagnosis not present

## 2020-05-31 DIAGNOSIS — J0430 Supraglottitis, unspecified, without obstruction: Secondary | ICD-10-CM | POA: Diagnosis not present

## 2020-05-31 DIAGNOSIS — Z833 Family history of diabetes mellitus: Secondary | ICD-10-CM

## 2020-05-31 DIAGNOSIS — R918 Other nonspecific abnormal finding of lung field: Secondary | ICD-10-CM | POA: Diagnosis not present

## 2020-05-31 DIAGNOSIS — D72821 Monocytosis (symptomatic): Secondary | ICD-10-CM | POA: Diagnosis not present

## 2020-05-31 DIAGNOSIS — R739 Hyperglycemia, unspecified: Secondary | ICD-10-CM | POA: Diagnosis not present

## 2020-05-31 DIAGNOSIS — R1310 Dysphagia, unspecified: Secondary | ICD-10-CM

## 2020-05-31 DIAGNOSIS — R0902 Hypoxemia: Secondary | ICD-10-CM

## 2020-05-31 DIAGNOSIS — Z8673 Personal history of transient ischemic attack (TIA), and cerebral infarction without residual deficits: Secondary | ICD-10-CM

## 2020-05-31 DIAGNOSIS — K219 Gastro-esophageal reflux disease without esophagitis: Secondary | ICD-10-CM | POA: Diagnosis present

## 2020-05-31 DIAGNOSIS — M7989 Other specified soft tissue disorders: Secondary | ICD-10-CM | POA: Diagnosis present

## 2020-05-31 DIAGNOSIS — R059 Cough, unspecified: Secondary | ICD-10-CM

## 2020-05-31 DIAGNOSIS — Z888 Allergy status to other drugs, medicaments and biological substances status: Secondary | ICD-10-CM

## 2020-05-31 DIAGNOSIS — R509 Fever, unspecified: Secondary | ICD-10-CM | POA: Diagnosis not present

## 2020-05-31 DIAGNOSIS — K1379 Other lesions of oral mucosa: Secondary | ICD-10-CM | POA: Diagnosis not present

## 2020-05-31 DIAGNOSIS — R4182 Altered mental status, unspecified: Secondary | ICD-10-CM | POA: Diagnosis not present

## 2020-05-31 DIAGNOSIS — D709 Neutropenia, unspecified: Secondary | ICD-10-CM | POA: Diagnosis not present

## 2020-05-31 DIAGNOSIS — Z825 Family history of asthma and other chronic lower respiratory diseases: Secondary | ICD-10-CM

## 2020-05-31 LAB — CBC WITH DIFFERENTIAL/PLATELET
Abs Immature Granulocytes: 0.03 10*3/uL (ref 0.00–0.07)
Basophils Absolute: 0 10*3/uL (ref 0.0–0.1)
Basophils Relative: 0 %
Eosinophils Absolute: 0 10*3/uL (ref 0.0–0.5)
Eosinophils Relative: 0 %
HCT: 33.3 % — ABNORMAL LOW (ref 36.0–46.0)
Hemoglobin: 11.1 g/dL — ABNORMAL LOW (ref 12.0–15.0)
Immature Granulocytes: 1 %
Lymphocytes Relative: 24 %
Lymphs Abs: 0.6 10*3/uL — ABNORMAL LOW (ref 0.7–4.0)
MCH: 32.4 pg (ref 26.0–34.0)
MCHC: 33.3 g/dL (ref 30.0–36.0)
MCV: 97.1 fL (ref 80.0–100.0)
Monocytes Absolute: 0.6 10*3/uL (ref 0.1–1.0)
Monocytes Relative: 23 %
Neutro Abs: 1.2 10*3/uL — ABNORMAL LOW (ref 1.7–7.7)
Neutrophils Relative %: 52 %
Platelets: 222 10*3/uL (ref 150–400)
RBC: 3.43 MIL/uL — ABNORMAL LOW (ref 3.87–5.11)
RDW: 13.5 % (ref 11.5–15.5)
WBC: 2.4 10*3/uL — ABNORMAL LOW (ref 4.0–10.5)
nRBC: 0 % (ref 0.0–0.2)

## 2020-05-31 LAB — COMPREHENSIVE METABOLIC PANEL
ALT: 20 U/L (ref 0–44)
AST: 27 U/L (ref 15–41)
Albumin: 3 g/dL — ABNORMAL LOW (ref 3.5–5.0)
Alkaline Phosphatase: 59 U/L (ref 38–126)
Anion gap: 14 (ref 5–15)
BUN: 14 mg/dL (ref 8–23)
CO2: 21 mmol/L — ABNORMAL LOW (ref 22–32)
Calcium: 8.5 mg/dL — ABNORMAL LOW (ref 8.9–10.3)
Chloride: 91 mmol/L — ABNORMAL LOW (ref 98–111)
Creatinine, Ser: 0.8 mg/dL (ref 0.44–1.00)
GFR calc Af Amer: >60 mL/min (ref >60)
GFR calc non Af Amer: >60 mL/min (ref >60)
Glucose, Bld: 102 mg/dL — ABNORMAL HIGH (ref 70–99)
Potassium: 2.6 mmol/L — CL (ref 3.5–5.1)
Sodium: 126 mmol/L — ABNORMAL LOW (ref 135–145)
Total Bilirubin: 1.4 mg/dL — ABNORMAL HIGH (ref 0.3–1.2)
Total Protein: 6.6 g/dL (ref 6.5–8.1)

## 2020-05-31 LAB — LACTIC ACID, PLASMA: Lactic Acid, Venous: 1.1 mmol/L (ref 0.5–1.9)

## 2020-05-31 LAB — SARS CORONAVIRUS 2 BY RT PCR (HOSPITAL ORDER, PERFORMED IN ~~LOC~~ HOSPITAL LAB): SARS Coronavirus 2: NEGATIVE

## 2020-05-31 LAB — PATHOLOGIST SMEAR REVIEW

## 2020-05-31 LAB — GROUP A STREP BY PCR: Group A Strep by PCR: NOT DETECTED

## 2020-05-31 MED ORDER — SODIUM CHLORIDE 0.9% FLUSH
3.0000 mL | Freq: Two times a day (BID) | INTRAVENOUS | Status: DC
  Administered 2020-06-01 – 2020-06-11 (×15): 3 mL via INTRAVENOUS

## 2020-05-31 MED ORDER — FENTANYL CITRATE (PF) 100 MCG/2ML IJ SOLN
50.0000 ug | Freq: Once | INTRAMUSCULAR | Status: AC
  Administered 2020-05-31: 50 ug via INTRAVENOUS
  Filled 2020-05-31: qty 2

## 2020-05-31 MED ORDER — ONDANSETRON HCL 4 MG/2ML IJ SOLN
4.0000 mg | Freq: Once | INTRAMUSCULAR | Status: AC
  Administered 2020-05-31: 4 mg via INTRAVENOUS
  Filled 2020-05-31: qty 2

## 2020-05-31 MED ORDER — HYDROMORPHONE HCL 1 MG/ML IJ SOLN
0.5000 mg | INTRAMUSCULAR | Status: DC | PRN
  Administered 2020-05-31 – 2020-06-04 (×14): 0.5 mg via INTRAVENOUS
  Filled 2020-05-31 (×15): qty 1

## 2020-05-31 MED ORDER — HYDROMORPHONE HCL 1 MG/ML IJ SOLN
1.0000 mg | Freq: Once | INTRAMUSCULAR | Status: AC
  Administered 2020-05-31: 1 mg via INTRAVENOUS
  Filled 2020-05-31: qty 1

## 2020-05-31 MED ORDER — METOPROLOL TARTRATE 5 MG/5ML IV SOLN
2.5000 mg | INTRAVENOUS | Status: DC | PRN
  Administered 2020-06-04 – 2020-06-06 (×5): 2.5 mg via INTRAVENOUS
  Filled 2020-05-31 (×6): qty 5

## 2020-05-31 MED ORDER — POTASSIUM CHLORIDE IN NACL 40-0.9 MEQ/L-% IV SOLN
INTRAVENOUS | Status: DC
  Filled 2020-05-31 (×3): qty 1000

## 2020-05-31 MED ORDER — POTASSIUM CHLORIDE 10 MEQ/100ML IV SOLN
10.0000 meq | INTRAVENOUS | Status: AC
  Administered 2020-05-31 (×3): 10 meq via INTRAVENOUS
  Filled 2020-05-31 (×3): qty 100

## 2020-05-31 MED ORDER — MORPHINE SULFATE (PF) 4 MG/ML IV SOLN
4.0000 mg | Freq: Once | INTRAVENOUS | Status: AC
  Administered 2020-05-31: 4 mg via INTRAVENOUS
  Filled 2020-05-31: qty 1

## 2020-05-31 MED ORDER — DEXTROSE 5 % IV SOLN
550.0000 mg | Freq: Three times a day (TID) | INTRAVENOUS | Status: DC
  Administered 2020-05-31 – 2020-06-05 (×15): 550 mg via INTRAVENOUS
  Filled 2020-05-31 (×19): qty 11

## 2020-05-31 MED ORDER — ACETAMINOPHEN 650 MG RE SUPP
650.0000 mg | Freq: Four times a day (QID) | RECTAL | Status: DC | PRN
  Administered 2020-06-06: 650 mg via RECTAL
  Filled 2020-05-31: qty 1

## 2020-05-31 MED ORDER — ONDANSETRON HCL 4 MG/2ML IJ SOLN: 4.0000 mg | Freq: Four times a day (QID) | INTRAMUSCULAR | Status: DC | PRN

## 2020-05-31 MED ORDER — BENZOCAINE 10 % MT GEL
Freq: Four times a day (QID) | OROMUCOSAL | Status: DC | PRN
  Administered 2020-06-10 – 2020-06-11 (×4): 1 via OROMUCOSAL
  Filled 2020-05-31 (×3): qty 9

## 2020-05-31 MED ORDER — IOHEXOL 300 MG/ML  SOLN
75.0000 mL | Freq: Once | INTRAMUSCULAR | Status: AC | PRN
  Administered 2020-05-31: 75 mL via INTRAVENOUS

## 2020-05-31 MED ORDER — ALBUTEROL SULFATE (2.5 MG/3ML) 0.083% IN NEBU: 2.5000 mg | INHALATION_SOLUTION | Freq: Four times a day (QID) | RESPIRATORY_TRACT | Status: DC | PRN

## 2020-05-31 MED ORDER — SODIUM CHLORIDE 0.9 % IV BOLUS
500.0000 mL | Freq: Once | INTRAVENOUS | Status: AC
  Administered 2020-05-31: 500 mL via INTRAVENOUS

## 2020-05-31 MED ORDER — ENOXAPARIN SODIUM 40 MG/0.4ML ~~LOC~~ SOLN
40.0000 mg | SUBCUTANEOUS | Status: DC
  Administered 2020-05-31 – 2020-06-05 (×4): 40 mg via SUBCUTANEOUS
  Filled 2020-05-31 (×5): qty 0.4

## 2020-05-31 MED ORDER — DEXAMETHASONE SODIUM PHOSPHATE 10 MG/ML IJ SOLN
10.0000 mg | Freq: Once | INTRAMUSCULAR | Status: AC
  Administered 2020-05-31: 10 mg via INTRAVENOUS
  Filled 2020-05-31: qty 1

## 2020-05-31 MED ORDER — LORAZEPAM 2 MG/ML IJ SOLN
0.2500 mg | Freq: Four times a day (QID) | INTRAMUSCULAR | Status: DC | PRN
  Administered 2020-06-03 – 2020-06-05 (×3): 0.5 mg via INTRAVENOUS
  Filled 2020-05-31 (×3): qty 1

## 2020-05-31 MED ORDER — ONDANSETRON HCL 4 MG PO TABS: 4.0000 mg | ORAL_TABLET | Freq: Four times a day (QID) | ORAL | Status: DC | PRN

## 2020-05-31 MED ORDER — FAMOTIDINE IN NACL 20-0.9 MG/50ML-% IV SOLN
20.0000 mg | INTRAVENOUS | Status: DC
  Administered 2020-05-31 – 2020-06-04 (×5): 20 mg via INTRAVENOUS
  Filled 2020-05-31 (×7): qty 50

## 2020-05-31 MED ORDER — ACETAMINOPHEN 325 MG PO TABS
650.0000 mg | ORAL_TABLET | Freq: Four times a day (QID) | ORAL | Status: DC | PRN
  Administered 2020-06-04 (×2): 650 mg via ORAL
  Filled 2020-05-31 (×2): qty 2

## 2020-05-31 NOTE — ED Notes (Signed)
Pt placed on 2 liters for comfort while pt sleeps. SpO2 dropped to 88% briefly

## 2020-05-31 NOTE — ED Triage Notes (Signed)
Pt arrives to ED with c/o of ongoing sore throat for over the past 1 month, pt is waiting to see ENT but appt is not until august 14th, pt has a slightly swollen uvula no obstructing airway, no stridor. Pt is able to speech is complete sentences but does sound hoarse, Pt is able to swollen but very painful to do so. Pt denies any trouble breathing. Pt also has multiple mouth ulcer as well. Pt has RA.

## 2020-05-31 NOTE — H&P (Addendum)
History and Physical    Katherine Wang YTK:[id number] DOB: February 15, 1946 DOA: 06/24/2020  Referring MD/NP/PA: Marda Stalker, MD PCP: [name], MD  Patient coming from: Home  Chief Complaint: Sore throat  I have personally briefly reviewed patient's old medical records in [place]   HPI: Katherine Wang is a 74 y.o. female with medical history significant of hypertension, hyperlipidemia, hypothyroidism, anxiety, depression, and rheumatoid arthritis presents with complaints of worsening sore throat over the last 2-3 weeks.  History is obtained from the patient with assistance from her daughter who is present bedside.  Rheumatologist had recently discontinued methotrexate and Arava.  She has been started on Cimzia for the first time on 7/6.  Within a week's time she reports that she started developing a sore throat with sores.  Evaluated multiple times and was negative for Covid and strep.  Tried on amoxicillin and subsequently prescribed doxycycline after no improvement in symptoms..  She reports that she has been coughing and getting choked up when trying to eat, drink, or take medications.  She suspects that some things may have gone down the wrong way.  Patient notes associated symptoms of change in voice, generalized malaise, and fevers up to 103 F at home.  Daughter notes that she had missed several of her medications due to her symptoms over the last few days.  ED Course: Upon admission to the emergency department patient was seen to be febrile up to 101.2 F with tachycardia.  Labs significant for WBC 2.4, hemoglobin 11.1, sodium 126, and potassium 2.6.  Group A strep negative.  Constance Holster of ENT was formally consulted.  Case was also discussed with ID mended checking for HSV.  Patient had been given 500 mL of normal saline IV fluids, antiemetics, pain medications, 10 mg of Decadron IV x1 dose, and started on acyclovir IV.  Review of Systems  Constitutional: Positive for fever and  malaise/fatigue.  HENT: Positive for congestion and sore throat. Negative for ear discharge.   Eyes: Negative for photophobia and pain.  Respiratory: Positive for cough and sputum production.   Cardiovascular: Negative for chest pain and leg swelling.  Gastrointestinal: Negative for abdominal pain and diarrhea.  Genitourinary: Negative for dysuria and frequency.  Musculoskeletal: Negative for falls.  Skin: Negative for itching.  Neurological: Negative for focal weakness and loss of consciousness.  Psychiatric/Behavioral: The patient has insomnia.     Past Medical History:  Diagnosis Date  . Adenomatous colon polyp 2012  . Allergy   . Anxiety   . Aortic insufficiency   . Arthritis   . Basal cell carcinoma   . Cataract   . Depression   . Diverticulosis   . Fibromyalgia   . Fibromyalgia   . Hyperlipidemia   . Hypertension   . Macular degeneration   . Melanoma ([place])   . Murmur, heart   . Reflux   . Stroke ([place]) 20 years ago   per pt. mild stroke  . Thyroid disease    hypothroidism     Past Surgical History:  Procedure Laterality Date  . ABDOMINAL HYSTERECTOMY    . APPENDECTOMY    . BASAL CELL CARCINOMA EXCISION     nose   . CARDIAC CATHETERIZATION    . CHOLECYSTECTOMY    . COLONOSCOPY     30 + years ago, unsure of where  . EYE SURGERY    . LASIK Bilateral   . MELANOMA EXCISION    . NASAL SEPTUM SURGERY    .  TUBAL LIGATION       reports that she quit smoking about 36 years ago. Her smoking use included cigarettes. She has a 40.00 pack-year smoking history. She has never used smokeless tobacco. She reports that she does not drink alcohol and does not use drugs.  Allergies  Allergen Reactions  . Tramadol Other (See Comments)    dizzy  . Statins     Body ache    Family History  Problem Relation Age of Onset  . Heart disease Mother   . Hypertension Mother   . Mental illness Mother   . Allergies Mother   . Heart disease Father   . Hypertension Father   .  Mental illness Father   . Emphysema Father        smoked  . Allergies Father   . Heart attack Father   . Hyperlipidemia Sister   . Hypertension Sister   . Allergies Sister   . Cancer Sister        oral  . Hypertension Sister   . Diabetes Sister   . Allergies Sister   . Epilepsy Son   . Aneurysm Son   . Hyperthyroidism Daughter   . Hypothyroidism Daughter   . Hypertension Daughter   . Basal cell carcinoma Daughter   . Colon cancer Neg Hx   . Esophageal cancer Neg Hx   . Rectal cancer Neg Hx   . Stomach cancer Neg Hx   . Pancreatic cancer Neg Hx   . Prostate cancer Neg Hx     Prior to Admission medications   Medication Sig Start Date End Date Taking? Authorizing Provider  albuterol (VENTOLIN HFA) 108 (90 Base) MCG/ACT inhaler Inhale 2 puffs into the lungs every 6 (six) hours as needed for wheezing or shortness of breath. 04/11/20  Yes Copland, Gay Filler, MD  Alirocumab (PRALUENT) 150 MG/ML SOAJ Inject 150 mg into the skin every 14 (fourteen) days. 05/13/20  Yes Larey Dresser, MD  amLODipine (NORVASC) 10 MG tablet Take 1 tablet (10 mg total) by mouth daily. At bedtime 10/27/19  Yes Copland, Gay Filler, MD  aspirin EC 81 MG tablet Take 1 tablet (81 mg total) by mouth daily. 04/20/16  Yes Larey Dresser, MD  carvedilol (COREG) 3.125 MG tablet Take 1 tablet (3.125 mg total) by mouth 2 (two) times daily with a meal. 02/23/20  Yes Larey Dresser, MD  Certolizumab Pegol (CIMZIA) 2 X 200 MG KIT Inject 400 mg into the skin. Inject 400 mg at weeks 0,2,4, then every 4 weeks. Administered in office.   Yes [provider]  cholecalciferol (VITAMIN D3) 25 MCG (1000 UT) tablet Take 2,000 Units by mouth daily.   Yes [provider]  clonazePAM (KLONOPIN) 0.5 MG tablet TAKE 1 TABLET BY MOUTH AT BEDTIME. MAY TAKE 2 TIMES DAILY AS NEEDED ON OCCASION Patient taking differently: Take 0.5 mg by mouth at bedtime. MAY TAKE 2 TIMES DAILY AS NEEDED ON OCCASION 05/16/20  Yes Copland,  Gay Filler, MD  Cyanocobalamin (VITAMIN B 12 PO) Take 1 tablet by mouth daily.   Yes [provider]  cycloSPORINE (RESTASIS) 0.05 % ophthalmic emulsion Place 1 drop into both eyes 2 (two) times daily.    Yes [provider]  famotidine (PEPCID) 20 MG tablet Take 1 tablet (20 mg total) by mouth daily. 05/25/20  Yes Copland, Gay Filler, MD  hydrochlorothiazide (HYDRODIURIL) 12.5 MG tablet Take 1 Tablet Daily Patient taking differently: Take 12.5 mg by mouth daily.  09/07/19  Yes Larey Dresser, MD  ibuprofen (ADVIL,MOTRIN) 200 MG tablet Take 600 mg by mouth 2 (two) times daily as needed for mild pain.   Yes [provider]  irbesartan (AVAPRO) 300 MG tablet Take 1 tablet (300 mg total) by mouth daily. 06/01/19  Yes Larey Dresser, MD  levothyroxine (SYNTHROID) 112 MCG tablet Take 1 tablet (112 mcg total) by mouth daily. 04/26/20  Yes Copland, Gay Filler, MD  montelukast (SINGULAIR) 10 MG tablet Take 1 tablet (10 mg total) by mouth at bedtime. 05/23/20  Yes Copland, Gay Filler, MD  Multiple Vitamins-Minerals (PRESERVISION AREDS) TABS Take 1 tablet by mouth 2 (two) times daily.   Yes [provider]  ondansetron (ZOFRAN ODT) 4 MG disintegrating tablet Take 1 tablet (4 mg total) by mouth every 8 (eight) hours as needed for nausea or vomiting. 05/28/20  Yes Letitia Neri L, PA-C  pantoprazole (PROTONIX) 40 MG tablet Take 1 tablet (40 mg total) by mouth daily. 05/23/20  Yes Copland, Gay Filler, MD  rosuvastatin (CRESTOR) 10 MG tablet Take 1 tablet (10 mg total) by mouth daily. 12/08/19  Yes Larey Dresser, MD  sertraline (ZOLOFT) 100 MG tablet TAKE 2 TABLETS(200 MG) BY MOUTH DAILY Patient taking differently: Take 200 mg by mouth daily.  05/25/20  Yes Copland, Gay Filler, MD  doxycycline (VIBRAMYCIN) 100 MG capsule Take 1 capsule (100 mg total) by mouth 2 (two) times daily. Patient not taking: Reported on 06/19/2020 05/26/20   Copland, Gay Filler, MD  magic mouthwash SOLN 2 tsp ac  and hs  Swish and spit Patient not taking: Reported on 06/02/2020 05/28/20   Johnn Hai, PA-C  potassium chloride (KLOR-CON) 20 MEQ packet Take 20 mEq by mouth once for 1 dose. Patient not taking: Reported on 06/20/2020 05/28/20 06/08/2020  Johnn Hai, PA-C    Physical Exam:  Constitutional: Elderly female who appears to be in some discomfort Vitals:   06/26/2020 1022 06/10/2020 1221 06/10/2020 1254 06/20/2020 1359  BP: 138/73 (!) 166/86 (!) 165/65 (!) 169/81  Pulse: 99 (!) 105 (!) 107 (!) 109  Resp: 16  16 14   Temp:      TempSrc:      SpO2: 97% 96% 95% 94%  Weight:      Height:       Eyes: PERRL, lids and conjunctivae normal ENMT: Erythematous pharynx with ulcerations appreciated even at the sides of her mouth.  Submandibular lymphadenopathy appreciated. Neck:  supple, no thyromegaly.  No stridor appreciated. Respiratory: clear to auscultation bilaterally, no wheezing, no crackles. Normal respiratory effort. No accessory muscle use.  Cardiovascular: Tachycardic, no murmurs / rubs / gallops. No extremity edema. 2+ pedal pulses. No carotid bruits.  Abdomen: no tenderness, no masses palpated. No hepatosplenomegaly. Bowel sounds positive.  Musculoskeletal: no clubbing / cyanosis. No joint deformity upper and lower extremities. Good ROM, no contractures. Normal muscle tone.  Skin: no rashes, lesions, ulcers. No induration Neurologic: CN 2-12 grossly intact. Sensation intact, DTR normal. Strength 5/5 in all 4.  Psychiatric: Normal judgment and insight. Alert and oriented x 3. Normal mood.     Labs on Admission: I have personally reviewed following labs and imaging studies  CBC: Recent Labs  Lab 05/28/20 1440 06/11/2020 1230  WBC 2.3* 2.4*  NEUTROABS 1.1* 1.2*  HGB 11.4* 11.1*  HCT 33.9* 33.3*  MCV 98.8 97.1  PLT 176 202   Basic Metabolic Panel: Recent Labs  Lab 05/28/20 1440 05/29/2020 1230  NA 133* 126*  K 3.0* 2.6*  CL 95* 91*  CO2 25 21*  GLUCOSE 111* 102*  BUN 13 14    CREATININE 0.68 0.80  CALCIUM 9.0 8.5*   GFR: Estimated Creatinine Clearance: 51.9 mL/min (by C-G formula based on SCr of 0.8 mg/dL). Liver Function Tests: Recent Labs  Lab 05/28/20 1440 06/07/2020 1230  AST 18 27  ALT 10 20  ALKPHOS 67 59  BILITOT 1.3* 1.4*  PROT 7.3 6.6  ALBUMIN 3.6 3.0*   No results for input(s): LIPASE, AMYLASE in the last 168 hours. No results for input(s): AMMONIA in the last 168 hours. Coagulation Profile: No results for input(s): INR, PROTIME in the last 168 hours. Cardiac Enzymes: No results for input(s): CKTOTAL, CKMB, CKMBINDEX, TROPONINI in the last 168 hours. BNP (last 3 results) Recent Labs    04/12/20 1049  PROBNP 43.0   HbA1C: No results for input(s): HGBA1C in the last 72 hours. CBG: No results for input(s): GLUCAP in the last 168 hours. Lipid Profile: No results for input(s): CHOL, HDL, LDLCALC, TRIG, CHOLHDL, LDLDIRECT in the last 72 hours. Thyroid Function Tests: No results for input(s): TSH, T4TOTAL, FREET4, T3FREE, THYROIDAB in the last 72 hours. Anemia Panel: No results for input(s): VITAMINB12, FOLATE, FERRITIN, TIBC, IRON, RETICCTPCT in the last 72 hours. Urine analysis:    Component Value Date/Time   COLORURINE YELLOW 08/03/2019 2008   APPEARANCEUR CLEAR 08/03/2019 2008   LABSPEC 1.015 08/03/2019 2008   PHURINE 6.0 08/03/2019 2008   GLUCOSEU NEGATIVE 08/03/2019 2008   HGBUR NEGATIVE 08/03/2019 2008   [place] NEGATIVE 08/03/2019 2008   BILIRUBINUR neg 04/12/2015 [facility] 08/03/2019 2008   PROTEINUR NEGATIVE 08/03/2019 2008   UROBILINOGEN 0.2 04/12/2015 0842   NITRITE NEGATIVE 08/03/2019 2008   LEUKOCYTESUR SMALL (A) 08/03/2019 2008   Sepsis Labs: Recent Results (from the past 240 hour(s))  SARS Coronavirus 2 by RT PCR (hospital order, performed in [facility] hospital lab) Nasopharyngeal Nasopharyngeal Swab     Status: None   Collection Time: 06/11/2020 12:06 PM   Specimen: Nasopharyngeal Swab   Result Value Ref Range Status   SARS Coronavirus 2 NEGATIVE NEGATIVE Final    Comment: (NOTE) SARS-CoV-2 target nucleic acids are NOT DETECTED.  The SARS-CoV-2 RNA is generally detectable in upper and lower respiratory specimens during the acute phase of infection. The lowest concentration of SARS-CoV-2 viral copies this assay can detect is 250 copies / mL. A negative result does not preclude SARS-CoV-2 infection and should not be used as the sole basis for treatment or other patient management decisions.  A negative result may occur with improper specimen collection / handling, submission of specimen other than nasopharyngeal swab, presence of viral mutation(s) within the areas targeted by this assay, and inadequate number of viral copies (<250 copies / mL). A negative result must be combined with clinical observations, patient history, and epidemiological information.  Fact Sheet for Patients:   StrictlyIdeas.no  Fact Sheet for Healthcare Providers: BankingDealers.co.za  This test is not yet approved or  cleared by the [place] FDA and has been authorized for detection and/or diagnosis of SARS-CoV-2 by FDA under an Emergency Use Authorization (EUA).  This EUA will remain in effect (meaning this test can be used) for the duration of the COVID-19 declaration under Section 564(b)(1) of the Act, 21 U.S.C. section 360bbb-3(b)(1), unless the authorization is terminated or revoked sooner.  Performed at [facility], [place] [address] [place], [place] [address]   Group A Strep by PCR  Status: None   Collection Time: 06/27/2020 12:06 PM  Result Value Ref Range Status   Group A Strep by PCR NOT DETECTED NOT DETECTED Final    Comment: Performed at [facility], [address], [place], [place] [address]     Radiological Exams on Admission: CT Soft Tissue Neck W Contrast  Result Date: 06/15/2020 CLINICAL DATA:   Epiglottitis or tonsillitis suspected; worsening difficulty breathing, sensation of "a ball" in throat, not able to tolerate saliva, fluids or breathing well. Viral appearing rash on back of throat. EXAM: CT NECK WITH CONTRAST TECHNIQUE: Multidetector CT imaging of the neck was performed using the standard protocol following the bolus administration of intravenous contrast. CONTRAST:  69m OMNIPAQUE IOHEXOL 300 MG/ML  SOLN COMPARISON:  No pertinent prior exams are available for comparison. FINDINGS: Pharynx and larynx: There is diffuse abnormal mucosal enhancement along the palatine tonsils, within the oropharynx, within the vallecular and within the supraglottic larynx. There is also prominent abnormal mucosal enhancement within the hypopharynx and upper cervical esophagus. There is prominent mucosal/submucosal edema affecting the palatine tonsils, oropharynx, epiglottis and supraglottic larynx. No evidence of soft tissue abscess. No retropharyngeal collection Salivary glands: No inflammation, mass, or stone. Thyroid: Unremarkable. Lymph nodes: Mildly enlarged right level II lymph node measuring 12 mm in short axis (series 11, image 27). No pathologically enlarged cervical chain lymph nodes identified elsewhere within the neck. Vascular: The major vascular structures of the neck are patent. Calcified atherosclerotic plaque within the visualized aortic arch, proximal major branch vessels of the neck and carotid bifurcations. Aberrant right subclavian artery. Limited intracranial: No acute intracranial abnormality identified. Visualized orbits: Excluded from the field of view. Mastoids and visualized paranasal sinuses: No significant paranasal sinus disease or mastoid effusion at the imaged levels. Skeleton: Cervical spondylosis with multilevel disc space narrowing, posterior disc osteophytes, uncovertebral and facet hypertrophy. No high-grade bony spinal canal stenosis. Upper chest: 6 mm ground-glass nodule within  the left lung apex. These results were called by telephone at the time of interpretation on 06/16/2020 at 1:48 pm to provider [facility], who verbally acknowledged these results. IMPRESSION: Imaging findings as described and most consistent with pharyngitis and supraglottitis as well as upper cervical esophagitis. There is prominent mucosal/submucosal edema affecting the palatine tonsils, oropharynx, epiglottis and supraglottic larynx. Resultant severe airway effacement at the level of the supraglottic larynx. No abscess or retropharyngeal collection is demonstrated. Mildly prominent right level II lymph node, possibly reactive. 6 mm ground-glass nodule within the left lung apex. Initial follow-up with CT at 6-12 months is recommended to confirm persistence. If persistent, repeat CT is recommended every 2 years until 5 years of stability has been established. This recommendation follows the consensus statement: Guidelines for Management of Incidental Pulmonary Nodules Detected on CT Images: From the Fleischner Society 2017; Radiology 2017; 284:228-243. Incidentally noted aberrant right subclavian artery. Electronically Signed   By: KKellie SimmeringDO   On: 06/16/2020 13:59       Assessment/Plan  Viral pharyngitis Supraglottitis Cervical esophagitis: Acute.  Patient presents with 2 to 3 weeks of progressively worsening sore throat.  On physical exam patient with multiple ulcerations of the oropharynx without signs of stridor.  Noted to be group B strep negative.  CT scan significant for pharyngitis, supraglottitis, and cervical esophagitis.  Given 10 mg of Decadron IV and started on acyclovir.   -Admit to a MedSurg bed -N.p.o. due to risk of aspiration -Follow-up blood and HSV studies -Continue empiric antibiotics of acyclovir -  Dilaudid IV as needed for pain -Appreciate ENT consultative services, follow-up for further evaluation  Hypokalemia: Acute. initial potassium noted to be low at 2.6.   Patient was ordered 30 mEq of potassium chloride IV. -Normal saline IV fluids with 40 mEq of potassium chloride at 75 mL/h -Recheck potassium in a.m.  Essential hypertension: Home blood pressure medications include amlodipine 10 mg daily, Coreg 3.125 mg twice daily, hydrochlorothiazide 12.5 mg, and ibesartan 300 mg daily. -Substituted metoprolol IV for elevated blood pressures as needed for now -Restart home medications as medically appropriate when able  Anxiety: At home patient on clonazepam 0.5 mg as needed. -Substitution of Ativan IV as needed for sleep  Hypothyroidism Home medications include levothyroxine 125 mcg daily. -Restart levothyroxine unable to tolerate p.o.  GERD: Home medications appear to include Pepcid 20 mg daily. -Change Pepcid to IV  DVT prophylaxis: Lovenox Code Status: Full Family Communication: Daughter updated at bedside Disposition Plan: Likely discharge home in 2 to 3 days once medically stable Consults called: ENT Admission status: Inpatient  Norval Morton MD [facility] Pager [phone]   If 7PM-7AM, please contact night-coverage [email or web address] Password [email or web address]  06/27/2020, 3:25 PM

## 2020-05-31 NOTE — ED Provider Notes (Signed)
[facility] Provider Note   CSN: [id number] Arrival date & time: 06/08/2020  1443     History Chief Complaint  Patient presents with  . Sore Throat    Katherine Wang is a 74 y.o. female.  The history is provided by the patient, a relative and medical records. No language interpreter was used.  Sore Throat This is a new problem. The current episode started more than 1 week ago. The problem occurs constantly. The problem has been rapidly worsening. Pertinent negatives include no chest pain, no abdominal pain, no headaches and no shortness of breath. The symptoms are aggravated by swallowing. Nothing relieves the symptoms. She has tried nothing for the symptoms. The treatment provided no relief.       Past Medical History:  Diagnosis Date  . Adenomatous colon polyp 2012  . Allergy   . Anxiety   . Aortic insufficiency   . Arthritis   . Basal cell carcinoma   . Cataract   . Depression   . Diverticulosis   . Fibromyalgia   . Fibromyalgia   . Hyperlipidemia   . Hypertension   . Macular degeneration   . Melanoma ([place])   . Murmur, heart   . Reflux   . Stroke ([place]) 20 years ago   per pt. mild stroke  . Thyroid disease    hypothroidism     Patient Active Problem List   Diagnosis Date Noted  . Chest pain 04/04/2019  . Anxiety 04/04/2019  . Leukopenia 04/04/2019  . Pneumonia 04/04/2019  . Dyspnea on exertion   . Moderate aortic regurgitation 11/02/2017  . Lightheadedness 11/02/2017  . Tachycardia 07/26/2017  . Atypical chest pain 07/26/2017  . High risk medications (not anticoagulants) long-term use 01/16/2017  . High risk medication use 12/19/2016  . Baker's cyst of knee, left 12/19/2016  . Pain in both knees 12/19/2016  . Pain of both elbows 12/19/2016  . Malignant melanoma ([place]) 12/19/2016  . Basal cell carcinoma 12/19/2016  . Chronic pansinusitis 08/15/2016  . Deviated septum 08/15/2016  . Epistaxis 08/15/2016  .  Hypertrophy, nasal, turbinate 08/15/2016  . Upper airway cough syndrome 04/25/2016  . Hx of adenomatous colonic polyps 06/21/2015  . Hyperlipidemia 11/15/2014  . Rheumatoid arthritis ([place]) 09/01/2014  . Pericarditis 04/24/2011  . Aortic valve disorder 02/14/2009  . ELECTROCARDIOGRAM, ABNORMAL 02/14/2009  . PERNICIOUS ANEMIA 07/10/2007  . DEPRESSION 07/03/2007  . MALAISE AND FATIGUE 07/03/2007  . Hypothyroidism 04/30/2007  . Essential hypertension 04/30/2007  . Aneurysm of thoracic aorta ([place]) 02/13/2006    Past Surgical History:  Procedure Laterality Date  . ABDOMINAL HYSTERECTOMY    . APPENDECTOMY    . BASAL CELL CARCINOMA EXCISION     nose   . CARDIAC CATHETERIZATION    . CHOLECYSTECTOMY    . COLONOSCOPY     30 + years ago, unsure of where  . EYE SURGERY    . LASIK Bilateral   . MELANOMA EXCISION    . NASAL SEPTUM SURGERY    . TUBAL LIGATION       OB History   No obstetric history on file.     Family History  Problem Relation Age of Onset  . Heart disease Mother   . Hypertension Mother   . Mental illness Mother   . Allergies Mother   . Heart disease Father   . Hypertension Father   . Mental illness Father   . Emphysema Father  smoked  . Allergies Father   . Heart attack Father   . Hyperlipidemia Sister   . Hypertension Sister   . Allergies Sister   . Cancer Sister        oral  . Hypertension Sister   . Diabetes Sister   . Allergies Sister   . Epilepsy Son   . Aneurysm Son   . Hyperthyroidism Daughter   . Hypothyroidism Daughter   . Hypertension Daughter   . Basal cell carcinoma Daughter   . Colon cancer Neg Hx   . Esophageal cancer Neg Hx   . Rectal cancer Neg Hx   . Stomach cancer Neg Hx   . Pancreatic cancer Neg Hx   . Prostate cancer Neg Hx     Social History   Tobacco Use  . Smoking status: Former Smoker    Packs/day: 2.00    Years: 20.00    Pack years: 40.00    Types: Cigarettes    Quit date: 10/30/1983    Years since  quitting: 36.6  . Smokeless tobacco: Never Used  Vaping Use  . Vaping Use: Never used  Substance Use Topics  . Alcohol use: No    Alcohol/week: 0.0 standard drinks  . Drug use: Never    Home Medications Prior to Admission medications   Medication Sig Start Date End Date Taking? Authorizing Provider  albuterol (VENTOLIN HFA) 108 (90 Base) MCG/ACT inhaler Inhale 2 puffs into the lungs every 6 (six) hours as needed for wheezing or shortness of breath. 04/11/20   Copland, Gay Filler, MD  Alirocumab (PRALUENT) 150 MG/ML SOAJ Inject 150 mg into the skin every 14 (fourteen) days. 05/13/20   Larey Dresser, MD  amLODipine (NORVASC) 10 MG tablet Take 1 tablet (10 mg total) by mouth daily. At bedtime 10/27/19   Copland, Gay Filler, MD  aspirin EC 81 MG tablet Take 1 tablet (81 mg total) by mouth daily. 04/20/16   Larey Dresser, MD  carvedilol (COREG) 3.125 MG tablet Take 1 tablet (3.125 mg total) by mouth 2 (two) times daily with a meal. 02/23/20   Larey Dresser, MD  Certolizumab Pegol (CIMZIA) 2 X 200 MG KIT Inject 400 mg into the skin. Inject 400 mg at weeks 0,2,4, then every 4 weeks. Administered in office.    [provider]  cholecalciferol (VITAMIN D3) 25 MCG (1000 UT) tablet Take 2,000 Units by mouth daily.    [provider]  clonazePAM (KLONOPIN) 0.5 MG tablet TAKE 1 TABLET BY MOUTH AT BEDTIME. MAY TAKE 2 TIMES DAILY AS NEEDED ON OCCASION 05/16/20   Copland, Gay Filler, MD  Cyanocobalamin (VITAMIN B 12 PO) Take 1 tablet by mouth daily.    [provider]  cycloSPORINE (RESTASIS) 0.05 % ophthalmic emulsion Place 1 drop into both eyes 2 (two) times daily.     [provider]  doxycycline (VIBRAMYCIN) 100 MG capsule Take 1 capsule (100 mg total) by mouth 2 (two) times daily. 05/26/20   Copland, Gay Filler, MD  famotidine (PEPCID) 20 MG tablet Take 1 tablet (20 mg total) by mouth daily. 05/25/20   Copland, Gay Filler, MD  hydrochlorothiazide (HYDRODIURIL) 12.5 MG  tablet Take 1 Tablet Daily 09/07/19   Larey Dresser, MD  ibuprofen (ADVIL,MOTRIN) 200 MG tablet Take 600 mg by mouth 2 (two) times daily as needed for mild pain.    [provider]  irbesartan (AVAPRO) 300 MG tablet Take 1 tablet (300 mg total) by mouth daily. 06/01/19  Larey Dresser, MD  levothyroxine (SYNTHROID) 112 MCG tablet Take 1 tablet (112 mcg total) by mouth daily. 04/26/20   Copland, Gay Filler, MD  magic mouthwash SOLN 2 tsp ac and hs  Swish and spit 05/28/20   Letitia Neri L, PA-C  montelukast (SINGULAIR) 10 MG tablet Take 1 tablet (10 mg total) by mouth at bedtime. 05/23/20   Copland, Gay Filler, MD  Multiple Vitamins-Minerals (PRESERVISION AREDS) TABS Take 1 tablet by mouth 2 (two) times daily.    [provider]  ondansetron (ZOFRAN ODT) 4 MG disintegrating tablet Take 1 tablet (4 mg total) by mouth every 8 (eight) hours as needed for nausea or vomiting. 05/28/20   Johnn Hai, PA-C  pantoprazole (PROTONIX) 40 MG tablet Take 1 tablet (40 mg total) by mouth daily. 05/23/20   Copland, Gay Filler, MD  potassium chloride (KLOR-CON) 20 MEQ packet Take 20 mEq by mouth once for 1 dose. 05/28/20 05/28/20  Johnn Hai, PA-C  rosuvastatin (CRESTOR) 10 MG tablet Take 1 tablet (10 mg total) by mouth daily. 12/08/19   Larey Dresser, MD  sertraline (ZOLOFT) 100 MG tablet TAKE 2 TABLETS(200 MG) BY MOUTH DAILY 05/25/20   Copland, Gay Filler, MD    Allergies    Tramadol and Statins  Review of Systems   Review of Systems  Constitutional: Positive for chills, fatigue and fever. Negative for diaphoresis.  HENT: Positive for drooling, sore throat, trouble swallowing and voice change. Negative for congestion, facial swelling, rhinorrhea and sinus pain.   Eyes: Negative for visual disturbance.  Respiratory: Negative for cough, chest tightness, shortness of breath and wheezing.   Cardiovascular: Negative for chest pain, palpitations and leg swelling.  Gastrointestinal:  Positive for nausea and vomiting. Negative for abdominal pain, constipation and diarrhea.  Genitourinary: Negative for dysuria and flank pain.  Musculoskeletal: Positive for neck pain. Negative for back pain and neck stiffness.  Skin: Positive for rash.  Neurological: Negative for dizziness, light-headedness, numbness and headaches.  Psychiatric/Behavioral: Negative for confusion.  All other systems reviewed and are negative.   Physical Exam Updated Vital Signs BP (!) 141/100 (BP Location: Right Arm)   Pulse (!) 120   Temp 99.8 F (37.7 C) (Oral)   Resp 20   SpO2 95%   Physical Exam Vitals and nursing note reviewed.  Constitutional:      General: She is not in acute distress.    Appearance: She is well-developed. She is not ill-appearing, toxic-appearing or diaphoretic.  HENT:     Head: Normocephalic and atraumatic.     Nose: No congestion or rhinorrhea.     Mouth/Throat:     Mouth: Oral lesions present.     Pharynx: Posterior oropharyngeal erythema and uvula swelling present.  Eyes:     Conjunctiva/sclera: Conjunctivae normal.  Cardiovascular:     Rate and Rhythm: Regular rhythm. Tachycardia present.     Heart sounds: Murmur heard.   Pulmonary:     Effort: Pulmonary effort is normal. No respiratory distress.     Breath sounds: Normal breath sounds. No wheezing, rhonchi or rales.  Chest:     Chest wall: No tenderness.  Abdominal:     Palpations: Abdomen is soft.     Tenderness: There is no abdominal tenderness.  Musculoskeletal:     Cervical back: Neck supple.  Skin:    General: Skin is warm and dry.     Capillary Refill: Capillary refill takes less than 2 seconds.     Findings: No erythema.  Neurological:     General: No focal deficit present.     Mental Status: She is alert.  Psychiatric:        Mood and Affect: Mood normal.         ED Results / Procedures / Treatments   Labs (all labs ordered are listed, but only abnormal results are displayed) Labs  Reviewed  CBC WITH DIFFERENTIAL/PLATELET - Abnormal; Notable for the following components:      Result Value   WBC 2.4 (*)    RBC 3.43 (*)    Hemoglobin 11.1 (*)    HCT 33.3 (*)    Neutro Abs 1.2 (*)    Lymphs Abs 0.6 (*)    All other components within normal limits  COMPREHENSIVE METABOLIC PANEL - Abnormal; Notable for the following components:   Sodium 126 (*)    Potassium 2.6 (*)    Chloride 91 (*)    CO2 21 (*)    Glucose, Bld 102 (*)    Calcium 8.5 (*)    Albumin 3.0 (*)    Total Bilirubin 1.4 (*)    All other components within normal limits  SARS CORONAVIRUS 2 BY RT PCR (HOSPITAL ORDER, [facility])  GROUP A STREP BY PCR  HSV CULTURE AND TYPING  CULTURE, BLOOD (ROUTINE X 2)  CULTURE, BLOOD (ROUTINE X 2)  LACTIC ACID, PLASMA  LACTIC ACID, PLASMA  PATHOLOGIST SMEAR REVIEW  HSV(HERPES SIMPLEX VRS) I + II AB-IGM    EKG None  Radiology CT Soft Tissue Neck W Contrast  Result Date: 06/24/2020 CLINICAL DATA:  Epiglottitis or tonsillitis suspected; worsening difficulty breathing, sensation of "a ball" in throat, not able to tolerate saliva, fluids or breathing well. Viral appearing rash on back of throat. EXAM: CT NECK WITH CONTRAST TECHNIQUE: Multidetector CT imaging of the neck was performed using the standard protocol following the bolus administration of intravenous contrast. CONTRAST:  70m OMNIPAQUE IOHEXOL 300 MG/ML  SOLN COMPARISON:  No pertinent prior exams are available for comparison. FINDINGS: Pharynx and larynx: There is diffuse abnormal mucosal enhancement along the palatine tonsils, within the oropharynx, within the vallecular and within the supraglottic larynx. There is also prominent abnormal mucosal enhancement within the hypopharynx and upper cervical esophagus. There is prominent mucosal/submucosal edema affecting the palatine tonsils, oropharynx, epiglottis and supraglottic larynx. No evidence of soft tissue abscess. No retropharyngeal  collection Salivary glands: No inflammation, mass, or stone. Thyroid: Unremarkable. Lymph nodes: Mildly enlarged right level II lymph node measuring 12 mm in short axis (series 11, image 27). No pathologically enlarged cervical chain lymph nodes identified elsewhere within the neck. Vascular: The major vascular structures of the neck are patent. Calcified atherosclerotic plaque within the visualized aortic arch, proximal major branch vessels of the neck and carotid bifurcations. Aberrant right subclavian artery. Limited intracranial: No acute intracranial abnormality identified. Visualized orbits: Excluded from the field of view. Mastoids and visualized paranasal sinuses: No significant paranasal sinus disease or mastoid effusion at the imaged levels. Skeleton: Cervical spondylosis with multilevel disc space narrowing, posterior disc osteophytes, uncovertebral and facet hypertrophy. No high-grade bony spinal canal stenosis. Upper chest: 6 mm ground-glass nodule within the left lung apex. These results were called by telephone at the time of interpretation on 06/28/2020 at 1:48 pm [facility] who verbally acknowledged these results. IMPRESSION: Imaging findings as described and most consistent with pharyngitis and supraglottitis as well as upper cervical esophagitis. There is prominent mucosal/submucosal edema affecting the palatine tonsils,  oropharynx, epiglottis and supraglottic larynx. Resultant severe airway effacement at the level of the supraglottic larynx. No abscess or retropharyngeal collection is demonstrated. Mildly prominent right level II lymph node, possibly reactive. 6 mm ground-glass nodule within the left lung apex. Initial follow-up with CT at 6-12 months is recommended to confirm persistence. If persistent, repeat CT is recommended every 2 years until 5 years of stability has been established. This recommendation follows the consensus statement: Guidelines for Management of  Incidental Pulmonary Nodules Detected on CT Images: From the Fleischner Society 2017; Radiology 2017; [id number]. Incidentally noted aberrant right subclavian artery. Electronically Signed   By: Kellie Simmering DO   On: 06/07/2020 13:59    Procedures Procedures (including critical care time) . CRITICAL CARE Performed by: Gwenyth Allegra  Total critical care time: 35 minutes Critical care time was exclusive of separately billable procedures and treating other patients. Critical care was necessary to treat or prevent imminent or life-threatening deterioration. Critical care was time spent personally by me on the following activities: development of treatment plan with patient and/or surrogate as well as nursing, discussions with consultants, evaluation of patient's response to treatment, examination of patient, obtaining history from patient or surrogate, ordering and performing treatments and interventions, ordering and review of laboratory studies, ordering and review of radiographic studies, pulse oximetry and re-evaluation of patient's condition.   Medications Ordered in ED Medications - No data to display  ED Course  I have reviewed the triage vital signs and the nursing notes.  Pertinent labs & imaging results that were available during my care of the patient were reviewed by me and considered in my medical decision making (see chart for details).    MDM Rules/Calculators/A&P                          Katherine Wang is a 74 y.o. female with a past medical history significant for hypertension, hypothyroidism, hyperlipidemia, known thoracic aortic aneurysm, malignant melanoma, rheumatoid arthritis, prior stroke, thyroid disease, anxiety, depression, and fibromyalgia who presents with worsening sore throat, intolerance of p.o., intolerance of saliva, fever, chills, and malaise.  Patient reports that she has bad rheumatoid arthritis and a first dose of Cimzia (Certolizumab Pegol) on 05/03/20  as well as getting Alirocumab on 7/16.  She also was on steroids in June for cough.  Her rheumatologist discontinued methotrexate and Arava.  Patient says that several days after the injection of the Cimzia, she started having sore throat.  She subsequently saw several providers who tested her for Covid and strep throat which were negative.  She also was started on Amoxil initially and then when it did not help doxycycline.   She reports that over the last few days, it has continued to worsen and now she feels there is a "large ball" inside her throat is preventing her from swallowing.  She says that she occasions having difficulty breathing based on position.  She says that she is not tolerating her home medications and is also not tolerating her saliva.  She is not able to get food or fluids down.  Family found a temperature of 103.9 at home today and she has been feeling worse overall.  They are concerned she is getting dehydrated now she cannot tolerate p.o. intake.  She reports her pain is 10 out of 10 in her throat and she says that her voice is now changing making a strange noise when she breathes.  She denies any chest  pain or abdominal pain.  She reports some nausea and vomiting occasionally.  She denies any headache.  On exam, patient does have a stridorous growl when she breathes while her there is being auscultated.  Oropharyngeal exam shows diffuse viral appearing rash in the back of her throat.  She also has the ulcerations under her lips and at the edge of her mouth.  Mucous membranes are dry.  Lungs are clear and chest is nontender.  Abdomen is nontender.  Murmur is appreciated.  Pulses present in extremities.  No rash seen on her hands or soles.  Patient is febrile, tachycardic but is not tachypneic.  She is on room air.  She is not hypotensive.  Clinically I am concerned that the patient is getting dehydrated given her decreased oral intake.  Patient may have a viral type pharyngitis either  from HSV or other infection but she is having intolerance of saliva and not able to swallow.  We will give her some fluids pain medicine and nausea medicine.  We will get a CT of her soft tissue neck as she reports she there is a ball not letting her swallow.  Will rule out abscess.  We will get a repeat strep test, Covid test, and an HSV swab.  I am somewhat concerned that her rheumatoid arthritis injections may have caused immunosuppression which may have exacerbated this infection given her report that her sore throat began several days after the new first-time injection of the Cimzia.  Anticipate admission given her vital signs and intolerance of saliva today.  12:41 PM Patient is just now getting her blood work drawn before she get the CT scan.  Anticipate admission after work-up is completed.  1:57 PM I called and spoke to the radiologist myself and we discussed the CT scan that was completed.  He is concerned about a supraglottitis/pharyngitis/epiglottitis with no evidence of abscess.  He does say there is severe effacement in the supraglottic area down to the vocal cords.  With this result, I will call both ENT and infectious disease to discuss antivirals.  She will need admission.  Her potassium also returned low at 2.6 likely as she cannot eat and drink.  We will give IV potassium and she will need admission we discussed the appropriate level of care needed.  2:26 PM Infectious disease recommended IV acyclovir.  ENT will come see the patient and recommended 10 mg of Decadron and they will come see the patient.  They all agreed with medicine admission for further monitoring and management.  Hospitalist will admit for further management.  Final Clinical Impression(s) / ED Diagnoses Final diagnoses:  Pharyngitis, unspecified etiology  Adult supraglottitis  Sore throat  Fever, unspecified fever cause  Hypokalemia    Rx / DC Orders ED Discharge Orders    None      Clinical  Impression: 1. Pharyngitis, unspecified etiology   2. Adult supraglottitis   3. Sore throat   4. Fever, unspecified fever cause   5. Hypokalemia     Disposition: Admit  This note was prepared with assistance of Dragon voice recognition software. Occasional wrong-word or sound-a-like substitutions may have occurred due to the inherent limitations of voice recognition software.     , Gwenyth Allegra, MD 06/24/2020 [phone]

## 2020-05-31 NOTE — Consult Note (Signed)
Reason for Consult: Oral ulcers and sore throat Referring Physician: Norval Morton, MD  Katherine Wang is an 74 y.o. female.  HPI: She received an injection of a biologic for rheumatoid arthritis about 3 or 4 weeks ago.  That was the first time she had had that particular treatment.  She was leukopenic prior to that which is why they wanted to switch her off of the methotrexate that she had been on before that.  Within about a week she started developing mouth sores.  It has progressed and continued to get worse until she got to where she could no longer swallow anything.  She was not able to take her hypertension medication.  She has been able to eat or drink anything.  She is accompanied by her daughter today.  She is being admitted to the hospital for electrolyte imbalance and for supportive care.  Past Medical History:  Diagnosis Date  . Adenomatous colon polyp 2012  . Allergy   . Anxiety   . Aortic insufficiency   . Arthritis   . Basal cell carcinoma   . Cataract   . Depression   . Diverticulosis   . Fibromyalgia   . Fibromyalgia   . Hyperlipidemia   . Hypertension   . Macular degeneration   . Melanoma ([place])   . Murmur, heart   . Reflux   . Stroke ([place]) 20 years ago   per pt. mild stroke  . Thyroid disease    hypothroidism     Past Surgical History:  Procedure Laterality Date  . ABDOMINAL HYSTERECTOMY    . APPENDECTOMY    . BASAL CELL CARCINOMA EXCISION     nose   . CARDIAC CATHETERIZATION    . CHOLECYSTECTOMY    . COLONOSCOPY     30 + years ago, unsure of where  . EYE SURGERY    . LASIK Bilateral   . MELANOMA EXCISION    . NASAL SEPTUM SURGERY    . TUBAL LIGATION      Family History  Problem Relation Age of Onset  . Heart disease Mother   . Hypertension Mother   . Mental illness Mother   . Allergies Mother   . Heart disease Father   . Hypertension Father   . Mental illness Father   . Emphysema Father        smoked  . Allergies Father   . Heart  attack Father   . Hyperlipidemia Sister   . Hypertension Sister   . Allergies Sister   . Cancer Sister        oral  . Hypertension Sister   . Diabetes Sister   . Allergies Sister   . Epilepsy Son   . Aneurysm Son   . Hyperthyroidism Daughter   . Hypothyroidism Daughter   . Hypertension Daughter   . Basal cell carcinoma Daughter   . Colon cancer Neg Hx   . Esophageal cancer Neg Hx   . Rectal cancer Neg Hx   . Stomach cancer Neg Hx   . Pancreatic cancer Neg Hx   . Prostate cancer Neg Hx     Social History:  reports that she quit smoking about 36 years ago. Her smoking use included cigarettes. She has a 40.00 pack-year smoking history. She has never used smokeless tobacco. She reports that she does not drink alcohol and does not use drugs.  Allergies:  Allergies  Allergen Reactions  . Tramadol Other (See Comments)    dizzy  .  Statins     Body ache    Medications: Reviewed  Results for orders placed or performed during the hospital encounter of 06/08/2020 (from the past 48 hour(s))  SARS Coronavirus 2 by RT PCR (hospital order, performed in [facility] hospital lab) Nasopharyngeal Nasopharyngeal Swab     Status: None   Collection Time: 06/10/2020 12:06 PM   Specimen: Nasopharyngeal Swab  Result Value Ref Range   SARS Coronavirus 2 NEGATIVE NEGATIVE    Comment: (NOTE) SARS-CoV-2 target nucleic acids are NOT DETECTED.  The SARS-CoV-2 RNA is generally detectable in upper and lower respiratory specimens during the acute phase of infection. The lowest concentration of SARS-CoV-2 viral copies this assay can detect is 250 copies / mL. A negative result does not preclude SARS-CoV-2 infection and should not be used as the sole basis for treatment or other patient management decisions.  A negative result may occur with improper specimen collection / handling, submission of specimen other than nasopharyngeal swab, presence of viral mutation(s) within the areas targeted by this  assay, and inadequate number of viral copies (<250 copies / mL). A negative result must be combined with clinical observations, patient history, and epidemiological information.  Fact Sheet for Patients:   StrictlyIdeas.no  Fact Sheet for Healthcare Providers: BankingDealers.co.za  This test is not yet approved or  cleared by the [place] FDA and has been authorized for detection and/or diagnosis of SARS-CoV-2 by FDA under an Emergency Use Authorization (EUA).  This EUA will remain in effect (meaning this test can be used) for the duration of the COVID-19 declaration under Section 564(b)(1) of the Act, 21 U.S.C. section 360bbb-3(b)(1), unless the authorization is terminated or revoked sooner.  Performed at [facility], [place] [address] [place], [place] [address]   Group A Strep by PCR     Status: None   Collection Time: 06/04/2020 12:06 PM  Result Value Ref Range   Group A Strep by PCR NOT DETECTED NOT DETECTED    Comment: Performed at [facility], [address] [place], [place] [address]  Lactic acid, plasma     Status: None   Collection Time: 06/23/2020 12:25 PM  Result Value Ref Range   Lactic Acid, Venous 1.1 0.5 - 1.9 mmol/L    Comment: Performed at [place] [address] [place], [place] [address]  CBC with Differential     Status: Abnormal   Collection Time: 06/15/2020 12:30 PM  Result Value Ref Range   WBC 2.4 (L) 4.0 - 10.5 K/uL   RBC 3.43 (L) 3.87 - 5.11 MIL/uL   Hemoglobin 11.1 (L) 12.0 - 15.0 g/dL   HCT 33.3 (L) 36 - 46 %   MCV 97.1 80.0 - 100.0 fL   MCH 32.4 26.0 - 34.0 pg   MCHC 33.3 30.0 - 36.0 g/dL   RDW 13.5 11.5 - 15.5 %   Platelets 222 150 - 400 K/uL   nRBC 0.0 0.0 - 0.2 %   Neutrophils Relative % 52 %   Neutro Abs 1.2 (L) 1.7 - 7.7 K/uL   Lymphocytes Relative 24 %   Lymphs Abs 0.6 (L) 0.7 - 4.0 K/uL   Monocytes Relative 23 %   Monocytes Absolute 0.6 0 - 1 K/uL   Eosinophils  Relative 0 %   Eosinophils Absolute 0.0 0 - 0 K/uL   Basophils Relative 0 %   Basophils Absolute 0.0 0 - 0 K/uL   WBC Morphology ATYPICAL MONONUCLEAR CELLS  Comment: INCREASED BANDS (>20% BANDS) VACUOLATED NEUTROPHILS    Immature Granulocytes 1 %   Abs Immature Granulocytes 0.03 0.00 - 0.07 K/uL    Comment: Performed at [facility], [place] [address], [place], [place] [address]  Comprehensive metabolic panel     Status: Abnormal   Collection Time: 06/21/2020 12:30 PM  Result Value Ref Range   Sodium 126 (L) 135 - 145 mmol/L   Potassium 2.6 (LL) 3.5 - 5.1 mmol/L    Comment: CRITICAL RESULT CALLED TO, READ BACK BY AND VERIFIED WITH: L.MEEKS,RN [id number] 06/14/2020 CLARK,S    Chloride 91 (L) 98 - 111 mmol/L   CO2 21 (L) 22 - 32 mmol/L   Glucose, Bld 102 (H) 70 - 99 mg/dL    Comment: Glucose reference range applies only to samples taken after fasting for at least 8 hours.   BUN 14 8 - 23 mg/dL   Creatinine, Ser 0.80 0.44 - 1.00 mg/dL   Calcium 8.5 (L) 8.9 - 10.3 mg/dL   Total Protein 6.6 6.5 - 8.1 g/dL   Albumin 3.0 (L) 3.5 - 5.0 g/dL   AST 27 15 - 41 U/L   ALT 20 0 - 44 U/L   Alkaline Phosphatase 59 38 - 126 U/L   Total Bilirubin 1.4 (H) 0.3 - 1.2 mg/dL   GFR calc non Af Amer >60 >60 mL/min   GFR calc Af Amer >60 >60 mL/min   Anion gap 14 5 - 15    Comment: Performed at [place] [address] [place], [place] [address]  Pathologist smear review     Status: None   Collection Time: 06/15/2020 12:30 PM  Result Value Ref Range   Path Review      Atypical mononuclear cells with circulating blasts.  Slight Neutropenia with left shift.    Comment: Reviewed by Chrystie Nose. Saralyn Pilar, M.D. 06/25/2020. Performed at [facility], [place] [address] [place], [place] [address]     CT Soft Tissue Neck W Contrast  Result Date: 06/23/2020 CLINICAL DATA:  Epiglottitis or tonsillitis suspected; worsening difficulty breathing, sensation of "a ball" in throat, not able to tolerate saliva,  fluids or breathing well. Viral appearing rash on back of throat. EXAM: CT NECK WITH CONTRAST TECHNIQUE: Multidetector CT imaging of the neck was performed using the standard protocol following the bolus administration of intravenous contrast. CONTRAST:  43mL OMNIPAQUE IOHEXOL 300 MG/ML  SOLN COMPARISON:  No pertinent prior exams are available for comparison. FINDINGS: Pharynx and larynx: There is diffuse abnormal mucosal enhancement along the palatine tonsils, within the oropharynx, within the vallecular and within the supraglottic larynx. There is also prominent abnormal mucosal enhancement within the hypopharynx and upper cervical esophagus. There is prominent mucosal/submucosal edema affecting the palatine tonsils, oropharynx, epiglottis and supraglottic larynx. No evidence of soft tissue abscess. No retropharyngeal collection Salivary glands: No inflammation, mass, or stone. Thyroid: Unremarkable. Lymph nodes: Mildly enlarged right level II lymph node measuring 12 mm in short axis (series 11, image 27). No pathologically enlarged cervical chain lymph nodes identified elsewhere within the neck. Vascular: The major vascular structures of the neck are patent. Calcified atherosclerotic plaque within the visualized aortic arch, proximal major branch vessels of the neck and carotid bifurcations. Aberrant right subclavian artery. Limited intracranial: No acute intracranial abnormality identified. Visualized orbits: Excluded from the field of view. Mastoids and visualized paranasal sinuses: No significant paranasal sinus disease or mastoid effusion at the imaged levels. Skeleton: Cervical spondylosis with multilevel disc  space narrowing, posterior disc osteophytes, uncovertebral and facet hypertrophy. No high-grade bony spinal canal stenosis. Upper chest: 6 mm ground-glass nodule within the left lung apex. These results were called by telephone at the time of interpretation on 06/13/2020 at 1:48 pm to provider  [facility] , who verbally acknowledged these results. IMPRESSION: Imaging findings as described and most consistent with pharyngitis and supraglottitis as well as upper cervical esophagitis. There is prominent mucosal/submucosal edema affecting the palatine tonsils, oropharynx, epiglottis and supraglottic larynx. Resultant severe airway effacement at the level of the supraglottic larynx. No abscess or retropharyngeal collection is demonstrated. Mildly prominent right level II lymph node, possibly reactive. 6 mm ground-glass nodule within the left lung apex. Initial follow-up with CT at 6-12 months is recommended to confirm persistence. If persistent, repeat CT is recommended every 2 years until 5 years of stability has been established. This recommendation follows the consensus statement: Guidelines for Management of Incidental Pulmonary Nodules Detected on CT Images: From the Fleischner Society 2017; Radiology 2017; 284:228-243. Incidentally noted aberrant right subclavian artery. Electronically Signed   By: Kellie Simmering DO   On: 06/03/2020 13:59    PRX:[id number] except as listed in admit H&P  Blood pressure 128/71, pulse (!) 108, temperature (!) 101.2 F (38.4 C), temperature source Oral, resp. rate 12, height 5' (1.524 m), weight 69 kg, SpO2 97 %.  PHYSICAL EXAM: Overall appearance:  Healthy appearing, in no distress.  She is breathing clearly.  Her voice is pretty good.  With heavy inspiration while lying supine and flat she has no stridor. Head:  Normocephalic, atraumatic. Ears: External ears look healthy. Nose: External nose is healthy in appearance. Internal nasal exam free of any lesions or obstruction. Oral Cavity/Pharynx:  There are diffuse aphthous ulcers throughout the oral cavity from the lips back to the posterior pharyngeal wall.  There is no edema.  She is not having difficulty handling secretions. Larynx/Hypopharynx: Deferred Neuro:  No identifiable neurologic deficits. Neck:  No palpable neck masses.  Studies Reviewed: CT of the neck  Procedures: none   Assessment/Plan: Diffuse aphthous ulcers throughout the oral cavity and pharynx and based on the CT imaging involving the larynx as well.  This is very likely related to the new treatment she received.  Admission to a medical service is an appropriate next step.  I will follow as well.  We can use supportive and symptomatic treatment and allow this to run its course.  Currently there is no stridor or airway distress.  We will continue to monitor.  Izora Gala 05/30/2020, 5:10 PM

## 2020-05-31 NOTE — Progress Notes (Signed)
Pharmacy Antibiotic Note  Katherine Wang is a 74 y.o. female admitted on 06/17/2020 with concern for HSV infection. ENT and infectious disease were consulted and both recommend IV acyclovir. Pharmacy has been consulted for acyclovir dosing.  Plan: - Acyclovir IV 550 mg q8h - Monitor renal function and assess for any necessary dose     adjustments - Monitor clinical status, length of therapy and follow ENT and ID recommendations   Height: 5' (152.4 cm) Weight: 69 kg (152 lb 1.9 oz) IBW/kg (Calculated) : 45.5  Temp (24hrs), Avg:100.5 F (38.1 C), Min:99.8 F (37.7 C), Max:101.2 F (38.4 C)  Recent Labs  Lab 05/28/20 1440 06/06/2020 1225 06/28/2020 1230  WBC 2.3*  --  2.4*  CREATININE 0.68  --  0.80  LATICACIDVEN  --  1.1  --     Estimated Creatinine Clearance: 51.9 mL/min (by C-G formula based on SCr of 0.8 mg/dL).    Allergies  Allergen Reactions  . Tramadol Other (See Comments)    dizzy  . Statins     Body ache    Antimicrobials this admission: 8/3 acyclovir >>  Microbiology results: 8/3 HSV culture and typing  8/3 Bcx >> 8/3 Group A strep by PCR - not detected  Thank you for allowing pharmacy to be a part of this patient's care.  Shauna Hugh, PharmD, [place]  PGY-1 Pharmacy Resident 06/07/2020 2:24 PM  Please check [email or web address] for unit-specific pharmacy phone numbers.

## 2020-06-01 ENCOUNTER — Encounter (HOSPITAL_COMMUNITY): Payer: Self-pay | Admitting: Internal Medicine

## 2020-06-01 DIAGNOSIS — I10 Essential (primary) hypertension: Secondary | ICD-10-CM | POA: Diagnosis not present

## 2020-06-01 DIAGNOSIS — E039 Hypothyroidism, unspecified: Secondary | ICD-10-CM | POA: Diagnosis not present

## 2020-06-01 DIAGNOSIS — K209 Esophagitis, unspecified without bleeding: Secondary | ICD-10-CM | POA: Diagnosis not present

## 2020-06-01 DIAGNOSIS — J029 Acute pharyngitis, unspecified: Secondary | ICD-10-CM | POA: Diagnosis not present

## 2020-06-01 LAB — CBC WITH DIFFERENTIAL/PLATELET
Abs Immature Granulocytes: 0.03 10*3/uL (ref 0.00–0.07)
Basophils Absolute: 0 10*3/uL (ref 0.0–0.1)
Basophils Relative: 1 %
Eosinophils Absolute: 0 10*3/uL (ref 0.0–0.5)
Eosinophils Relative: 0 %
HCT: 32.1 % — ABNORMAL LOW (ref 36.0–46.0)
Hemoglobin: 10.4 g/dL — ABNORMAL LOW (ref 12.0–15.0)
Immature Granulocytes: 2 %
Lymphocytes Relative: 18 %
Lymphs Abs: 0.3 10*3/uL — ABNORMAL LOW (ref 0.7–4.0)
MCH: 32.3 pg (ref 26.0–34.0)
MCHC: 32.4 g/dL (ref 30.0–36.0)
MCV: 99.7 fL (ref 80.0–100.0)
Monocytes Absolute: 0.5 10*3/uL (ref 0.1–1.0)
Monocytes Relative: 24 %
Neutro Abs: 1 10*3/uL — ABNORMAL LOW (ref 1.7–7.7)
Neutrophils Relative %: 55 %
Platelets: 237 10*3/uL (ref 150–400)
RBC: 3.22 MIL/uL — ABNORMAL LOW (ref 3.87–5.11)
RDW: 13.8 % (ref 11.5–15.5)
WBC: 1.9 10*3/uL — ABNORMAL LOW (ref 4.0–10.5)
nRBC: 0 % (ref 0.0–0.2)

## 2020-06-01 LAB — BASIC METABOLIC PANEL
Anion gap: 11 (ref 5–15)
BUN: 19 mg/dL (ref 8–23)
CO2: 22 mmol/L (ref 22–32)
Calcium: 8.2 mg/dL — ABNORMAL LOW (ref 8.9–10.3)
Chloride: 99 mmol/L (ref 98–111)
Creatinine, Ser: 0.7 mg/dL (ref 0.44–1.00)
GFR calc Af Amer: >60 mL/min (ref >60)
GFR calc non Af Amer: >60 mL/min (ref >60)
Glucose, Bld: 126 mg/dL — ABNORMAL HIGH (ref 70–99)
Potassium: 3.6 mmol/L (ref 3.5–5.1)
Sodium: 132 mmol/L — ABNORMAL LOW (ref 135–145)

## 2020-06-01 LAB — CBC
HCT: 32.1 % — ABNORMAL LOW (ref 36.0–46.0)
Hemoglobin: 10.7 g/dL — ABNORMAL LOW (ref 12.0–15.0)
MCH: 33.3 pg (ref 26.0–34.0)
MCHC: 33.3 g/dL (ref 30.0–36.0)
MCV: 100 fL (ref 80.0–100.0)
Platelets: 238 10*3/uL (ref 150–400)
RBC: 3.21 MIL/uL — ABNORMAL LOW (ref 3.87–5.11)
RDW: 13.9 % (ref 11.5–15.5)
WBC: 1.9 10*3/uL — ABNORMAL LOW (ref 4.0–10.5)
nRBC: 0 % (ref 0.0–0.2)

## 2020-06-01 LAB — MAGNESIUM: Magnesium: 2.1 mg/dL (ref 1.7–2.4)

## 2020-06-01 MED ORDER — PANTOPRAZOLE SODIUM 40 MG IV SOLR
40.0000 mg | Freq: Every day | INTRAVENOUS | Status: DC
  Administered 2020-06-01 – 2020-06-04 (×4): 40 mg via INTRAVENOUS
  Filled 2020-06-01 (×4): qty 40

## 2020-06-01 MED ORDER — HYDRALAZINE HCL 20 MG/ML IJ SOLN: 10.0000 mg | Freq: Four times a day (QID) | INTRAMUSCULAR | Status: DC | PRN

## 2020-06-01 NOTE — Telephone Encounter (Signed)
Late entry addendum 8/4 I called patient and spoke with her on 7/29, per my recollection I did document this phone call but I do not see documentation on chart.  I was traveling and having some difficulty with Internet connection and note may have been lost In any case, we discussed her persistent symptoms of sore throat and low-grade fever. Also having some chest congestion.  She was not improving with amoxicillin.  We decided to change her to doxycycline in case of bronchitis/lung disease and also to cover the possibility of tickborne illness.  The patient then contacted [place] on 7/30 because she was vomiting, we discussed again.  She was feeling worse and had noted fever up to about 101.  She did not sound good to me, I advised her to go the emergency room for evaluation.  However at the time of our call Konner felt that she was actually a bit better and fever was resolved, and did not wish to go to the ER.  Advised her to monitor her symptoms carefully and go to the ER if she began to get worse.  She did end up being evaluated at the ER the next day, 7/31.

## 2020-06-01 NOTE — Progress Notes (Signed)
Patient ID: Katherine Wang, female   DOB: 27-May-1946, 74 y.o.   MRN: [id number] Subjective: There is some misunderstanding last night and pain medicine was held this morning because there was thought that she might be going for surgery today.  I have discussed with the ER nurses that there is no indication for any surgery right now.  We will go ahead and give her pain medicine.  Without pain medicine she was feeling quite a bit worse this morning.  After she received medicine she started to feel better again.  Otherwise no new changes.  Objective: Vital signs in last 24 hours: Temp:  [100 F (37.8 C)] 100 F (37.8 C) (08/03 1809) Pulse Rate:  [87-109] 99 (08/04 1134) Resp:  [12-21] 16 (08/04 0054) BP: (111-169)/(65-86) 111/83 (08/04 0837) SpO2:  [94 %-100 %] 100 % (08/04 1134) Weight change:     Intake/Output from previous day: 08/03 0701 - 08/04 0700 In: 950 [IV Piggyback:950] Out: -  Intake/Output this shift: No intake/output data recorded.  PHYSICAL EXAM: She is lying semisupine.  Her breathing is clear.  Her voice is reasonably clear, excess secretions are audible in her throat.  There is no stridor.  Oral cavity and pharynx are unchanged.  Flexible fiberoptic laryngoscopy was performed today after topical Afrin/Xylocaine spray application.  The nasal cavity is clear on the right.  The nasopharynx is clear.  The oropharynx reveals erythema of the posterior pharyngeal wall.  The epiglottis is edematous but nonobstructing.  There is some frothy secretions in the vallecula and the piriforms but there is no mass-effect.  The cords themselves are healthy and normal looking with good mobility.  The airway is completely clear.I have reviewed the patient's current medications.  Lab Results: Recent Labs    06/13/2020 1230 06/01/20 0525  WBC 2.4* 1.9*  1.9*  HGB 11.1* 10.4*  10.7*  HCT 33.3* 32.1*  32.1*  PLT 222 237  238   BMET Recent Labs    06/25/2020 1230 06/01/20 0525  NA 126*  132*  K 2.6* 3.6  CL 91* 99  CO2 21* 22  GLUCOSE 102* 126*  BUN 14 19  CREATININE 0.80 0.70  CALCIUM 8.5* 8.2*    Studies/Results: CT Soft Tissue Neck W Contrast  Result Date: 06/27/2020 CLINICAL DATA:  Epiglottitis or tonsillitis suspected; worsening difficulty breathing, sensation of "a ball" in throat, not able to tolerate saliva, fluids or breathing well. Viral appearing rash on back of throat. EXAM: CT NECK WITH CONTRAST TECHNIQUE: Multidetector CT imaging of the neck was performed using the standard protocol following the bolus administration of intravenous contrast. CONTRAST:  44mL OMNIPAQUE IOHEXOL 300 MG/ML  SOLN COMPARISON:  No pertinent prior exams are available for comparison. FINDINGS: Pharynx and larynx: There is diffuse abnormal mucosal enhancement along the palatine tonsils, within the oropharynx, within the vallecular and within the supraglottic larynx. There is also prominent abnormal mucosal enhancement within the hypopharynx and upper cervical esophagus. There is prominent mucosal/submucosal edema affecting the palatine tonsils, oropharynx, epiglottis and supraglottic larynx. No evidence of soft tissue abscess. No retropharyngeal collection Salivary glands: No inflammation, mass, or stone. Thyroid: Unremarkable. Lymph nodes: Mildly enlarged right level II lymph node measuring 12 mm in short axis (series 11, image 27). No pathologically enlarged cervical chain lymph nodes identified elsewhere within the neck. Vascular: The major vascular structures of the neck are patent. Calcified atherosclerotic plaque within the visualized aortic arch, proximal major branch vessels of the neck and carotid bifurcations. Aberrant right subclavian  artery. Limited intracranial: No acute intracranial abnormality identified. Visualized orbits: Excluded from the field of view. Mastoids and visualized paranasal sinuses: No significant paranasal sinus disease or mastoid effusion at the imaged levels.  Skeleton: Cervical spondylosis with multilevel disc space narrowing, posterior disc osteophytes, uncovertebral and facet hypertrophy. No high-grade bony spinal canal stenosis. Upper chest: 6 mm ground-glass nodule within the left lung apex. These results were called by telephone at the time of interpretation on 06/09/2020 at 1:48 pm to provider [facility] , who verbally acknowledged these results. IMPRESSION: Imaging findings as described and most consistent with pharyngitis and supraglottitis as well as upper cervical esophagitis. There is prominent mucosal/submucosal edema affecting the palatine tonsils, oropharynx, epiglottis and supraglottic larynx. Resultant severe airway effacement at the level of the supraglottic larynx. No abscess or retropharyngeal collection is demonstrated. Mildly prominent right level II lymph node, possibly reactive. 6 mm ground-glass nodule within the left lung apex. Initial follow-up with CT at 6-12 months is recommended to confirm persistence. If persistent, repeat CT is recommended every 2 years until 5 years of stability has been established. This recommendation follows the consensus statement: Guidelines for Management of Incidental Pulmonary Nodules Detected on CT Images: From the Fleischner Society 2017; Radiology 2017; 284:228-243. Incidentally noted aberrant right subclavian artery. Electronically Signed   By: Kellie Simmering DO   On: 06/03/2020 13:59   DG CHEST PORT 1 VIEW  Result Date: 06/28/2020 CLINICAL DATA:  Cough. EXAM: PORTABLE CHEST 1 VIEW COMPARISON:  04/11/2020 FINDINGS: The cardiac silhouette, mediastinal and hilar contours are stable. Moderate tortuosity and calcification of the thoracic aorta. The lungs are clear of an acute process. No pleural effusions or pulmonary infiltrates. No worrisome pulmonary lesions. The bony thorax is intact. IMPRESSION: No acute cardiopulmonary findings. Electronically Signed   By: Marijo Sanes M.D.   On: 06/16/2020 18:07     Medications: I have reviewed the patient's current medications.  Assessment/Plan: Stable, continue supportive care.  Expect a full recovery but it may take days or weeks.  We will continue to follow.  LOS: 1 day   Izora Gala 06/01/2020, 12:14 PM

## 2020-06-01 NOTE — ED Notes (Signed)
ENT at bedside

## 2020-06-01 NOTE — Progress Notes (Addendum)
PROGRESS NOTE    Katherine Wang  INO:[id number] DOB: December 11, 1945 DOA: 06/05/2020 PCP: [name], MD   Brief Narrative: Katherine Wang is a 74 y.o. female history of hypertension, hyperlipidemia, hypothyroidism, anxiety, depression rheumatoid arthritis.  Patient presented secondary to worsening sore throat over the last 2 to 3 weeks and found to have multiple aphthous ulcers with evidence of pharyngitis and supraglottitis with associated upper cervical esophagitis.  ENT consult on admission with no recommendation for airway management at this time.   Assessment & Plan:   Active Problems:   Hypothyroidism   Essential hypertension   Anxiety   Viral pharyngitis   Hypokalemia   Supraglottitis   Esophagitis   Viral pharyngitis Esophagitis Supraglottitis In setting of leukopenia with neutropenia and lymphocytopenia. ENT consulted and recommendations for supportive care. -Continue acyclovir -ENT recommendations: supportive care  Leukopenia Neutropenia/Lymphocytopenia Mild. Patient was previously on methotrexate which appears to be the cause. Neutropenia is stable currently. With fever, may need to consider bacterial cause, however, with above findings, likely viral pharyngitis.   Odynophagia Secondary to above. Managed with IV pain medication. Unable to eat secondary to pain -Continue Dilaudid prn -If unable to eat in 24 hours, will consider NG tube placement for nutrition (discussed with ENT) -Continue IV fluids  Hyponatremia Secondary to poor fluid intake. Improved with IV fluids.  Ground-glass nodule Noted incidentally on CT scan.  Nodule described as a 6 mm groundglass nodule in the left lung apex.  Recommendation for follow-up CT at 6 to 12 months.  Patient will need outpatient follow-up.  Hypothyroidism Patient is on Synthroid 112 mcg daily -Hold for now, if unable to take PO in a couple of days, will start IV equivalent dose of Synthroid  Essential  hypertension Patient is on amlodipine, hydrochlorothiazide and irbesartan as an outpatient. BP mostly controlled -Hydralazine prn  GERD Patient is on Pepcid and Protonix as an outpatient. Currently unable to swallow well secondary to above -Protonix IV daily  Rheumatoid arthritis Patient was previously on methotrexate and is now on alirocumab as an outpatient.   DVT prophylaxis: Lovenox Code Status:   Code Status: Full Code Family Communication: Daughter at bedside Disposition Plan: Discharge home likely in several days   Consultants:   ENT  Procedures:   None  Antimicrobials:  Acyclovir IV    Subjective: Pain with swallowing. No other issues. Feels bad secondary to pain.  Objective: Vitals:   06/01/20 0837 06/01/20 1134 06/01/20 1407 06/01/20 1505  BP: 111/83  140/77   Pulse: 100 99 (!) 101 99  Resp:      Temp:      TempSrc:      SpO2: 100% 100% 100% 95%  Weight:      Height:        Intake/Output Summary (Last 24 hours) at 06/01/2020 1610 Last data filed at 06/01/2020 0052 Gross per 24 hour  Intake 350 ml  Output --  Net 350 ml   Filed Weights   06/22/2020 0834  Weight: 69 kg    Examination:  General exam: Appears calm and comfortable HEENT: multiple small ulcers with some mild erythema noted over pharyngeal/buccal areas Respiratory system: Clear to auscultation. Respiratory effort normal. Cardiovascular system: S1 & S2 heard, RRR. No murmurs, rubs, gallops or clicks. Gastrointestinal system: Abdomen is nondistended, soft and nontender. No organomegaly or masses felt. Normal bowel sounds heard. Central nervous system: Alert and oriented. No focal neurological deficits. Musculoskeletal: No edema. No calf tenderness Skin: No cyanosis. No rashes  Psychiatry: Judgement and insight appear normal. Mood & affect appropriate.     Data Reviewed: I have personally reviewed following labs and imaging studies  CBC Lab Results  Component Value Date   WBC 1.9  (L) 06/01/2020   WBC 1.9 (L) 06/01/2020   RBC 3.21 (L) 06/01/2020   RBC 3.22 (L) 06/01/2020   HGB 10.7 (L) 06/01/2020   HGB 10.4 (L) 06/01/2020   HCT 32.1 (L) 06/01/2020   HCT 32.1 (L) 06/01/2020   MCV 100.0 06/01/2020   MCV 99.7 06/01/2020   MCH 33.3 06/01/2020   MCH 32.3 06/01/2020   PLT 238 06/01/2020   PLT 237 06/01/2020   MCHC 33.3 06/01/2020   MCHC 32.4 06/01/2020   RDW 13.9 06/01/2020   RDW 13.8 06/01/2020   LYMPHSABS 0.3 (L) 06/01/2020   MONOABS 0.5 06/01/2020   EOSABS 0.0 06/01/2020   BASOSABS 0.0 51/88/4166     Last metabolic panel Lab Results  Component Value Date   NA 132 (L) 06/01/2020   K 3.6 06/01/2020   CL 99 06/01/2020   CO2 22 06/01/2020   BUN 19 06/01/2020   CREATININE 0.70 06/01/2020   GLUCOSE 126 (H) 06/01/2020   GFRNONAA >60 06/01/2020   GFRAA >60 06/01/2020   CALCIUM 8.2 (L) 06/01/2020   PROT 6.6 06/15/2020   ALBUMIN 3.0 (L) 06/25/2020   BILITOT 1.4 (H) 06/26/2020   ALKPHOS 59 06/01/2020   AST 27 05/30/2020   ALT 20 06/08/2020   ANIONGAP 11 06/01/2020    CBG (last 3)  No results for input(s): GLUCAP in the last 72 hours.   GFR: Estimated Creatinine Clearance: 51.9 mL/min (by C-G formula based on SCr of 0.7 mg/dL).  Coagulation Profile: No results for input(s): INR, PROTIME in the last 168 hours.  Recent Results (from the past 240 hour(s))  SARS Coronavirus 2 by RT PCR (hospital order, performed in [facility] hospital lab) Nasopharyngeal Nasopharyngeal Swab     Status: None   Collection Time: 06/27/2020 12:06 PM   Specimen: Nasopharyngeal Swab  Result Value Ref Range Status   SARS Coronavirus 2 NEGATIVE NEGATIVE Final    Comment: (NOTE) SARS-CoV-2 target nucleic acids are NOT DETECTED.  The SARS-CoV-2 RNA is generally detectable in upper and lower respiratory specimens during the acute phase of infection. The lowest concentration of SARS-CoV-2 viral copies this assay can detect is 250 copies / mL. A negative result does not  preclude SARS-CoV-2 infection and should not be used as the sole basis for treatment or other patient management decisions.  A negative result may occur with improper specimen collection / handling, submission of specimen other than nasopharyngeal swab, presence of viral mutation(s) within the areas targeted by this assay, and inadequate number of viral copies (<250 copies / mL). A negative result must be combined with clinical observations, patient history, and epidemiological information.  Fact Sheet for Patients:   StrictlyIdeas.no  Fact Sheet for Healthcare Providers: BankingDealers.co.za  This test is not yet approved or  cleared by the [place] FDA and has been authorized for detection and/or diagnosis of SARS-CoV-2 by FDA under an Emergency Use Authorization (EUA).  This EUA will remain in effect (meaning this test can be used) for the duration of the COVID-19 declaration under Section 564(b)(1) of the Act, 21 U.S.C. section 360bbb-3(b)(1), unless the authorization is terminated or revoked sooner.  Performed at [facility], [place] [address], [place], [place] [address]   Group A Strep by PCR     Status:  None   Collection Time: 06/21/2020 12:06 PM  Result Value Ref Range Status   Group A Strep by PCR NOT DETECTED NOT DETECTED Final    Comment: Performed at [facility], [address] [place], [place] [address]  Blood culture (routine x 2)     Status: None (Preliminary result)   Collection Time: 05/30/2020 12:33 PM   Specimen: BLOOD  Result Value Ref Range Status   Specimen Description BLOOD RIGHT ANTECUBITAL  Final   Special Requests   Final    BOTTLES DRAWN AEROBIC AND ANAEROBIC Blood Culture adequate volume   Culture   Final    NO GROWTH < 24 HOURS Performed at [facility], [place] [address], [place], [place] [address]    Report Status PENDING  Incomplete        Radiology Studies: CT Soft Tissue  Neck W Contrast  Result Date: 06/11/2020 CLINICAL DATA:  Epiglottitis or tonsillitis suspected; worsening difficulty breathing, sensation of "a ball" in throat, not able to tolerate saliva, fluids or breathing well. Viral appearing rash on back of throat. EXAM: CT NECK WITH CONTRAST TECHNIQUE: Multidetector CT imaging of the neck was performed using the standard protocol following the bolus administration of intravenous contrast. CONTRAST:  10mL OMNIPAQUE IOHEXOL 300 MG/ML  SOLN COMPARISON:  No pertinent prior exams are available for comparison. FINDINGS: Pharynx and larynx: There is diffuse abnormal mucosal enhancement along the palatine tonsils, within the oropharynx, within the vallecular and within the supraglottic larynx. There is also prominent abnormal mucosal enhancement within the hypopharynx and upper cervical esophagus. There is prominent mucosal/submucosal edema affecting the palatine tonsils, oropharynx, epiglottis and supraglottic larynx. No evidence of soft tissue abscess. No retropharyngeal collection Salivary glands: No inflammation, mass, or stone. Thyroid: Unremarkable. Lymph nodes: Mildly enlarged right level II lymph node measuring 12 mm in short axis (series 11, image 27). No pathologically enlarged cervical chain lymph nodes identified elsewhere within the neck. Vascular: The major vascular structures of the neck are patent. Calcified atherosclerotic plaque within the visualized aortic arch, proximal major branch vessels of the neck and carotid bifurcations. Aberrant right subclavian artery. Limited intracranial: No acute intracranial abnormality identified. Visualized orbits: Excluded from the field of view. Mastoids and visualized paranasal sinuses: No significant paranasal sinus disease or mastoid effusion at the imaged levels. Skeleton: Cervical spondylosis with multilevel disc space narrowing, posterior disc osteophytes, uncovertebral and facet hypertrophy. No high-grade bony spinal  canal stenosis. Upper chest: 6 mm ground-glass nodule within the left lung apex. These results were called by telephone at the time of interpretation on 06/28/2020 at 1:48 pm to provider [facility] , who verbally acknowledged these results. IMPRESSION: Imaging findings as described and most consistent with pharyngitis and supraglottitis as well as upper cervical esophagitis. There is prominent mucosal/submucosal edema affecting the palatine tonsils, oropharynx, epiglottis and supraglottic larynx. Resultant severe airway effacement at the level of the supraglottic larynx. No abscess or retropharyngeal collection is demonstrated. Mildly prominent right level II lymph node, possibly reactive. 6 mm ground-glass nodule within the left lung apex. Initial follow-up with CT at 6-12 months is recommended to confirm persistence. If persistent, repeat CT is recommended every 2 years until 5 years of stability has been established. This recommendation follows the consensus statement: Guidelines for Management of Incidental Pulmonary Nodules Detected on CT Images: From the Fleischner Society 2017; Radiology 2017; 284:228-243. Incidentally noted aberrant right subclavian artery. Electronically Signed   By: Kellie Simmering DO   On: 06/06/2020 13:59  DG CHEST PORT 1 VIEW  Result Date: 06/23/2020 CLINICAL DATA:  Cough. EXAM: PORTABLE CHEST 1 VIEW COMPARISON:  04/11/2020 FINDINGS: The cardiac silhouette, mediastinal and hilar contours are stable. Moderate tortuosity and calcification of the thoracic aorta. The lungs are clear of an acute process. No pleural effusions or pulmonary infiltrates. No worrisome pulmonary lesions. The bony thorax is intact. IMPRESSION: No acute cardiopulmonary findings. Electronically Signed   By: Marijo Sanes M.D.   On: 06/03/2020 18:07        Scheduled Meds:  enoxaparin (LOVENOX) injection  40 mg Subcutaneous Q24H   sodium chloride flush  3 mL Intravenous Q12H   Continuous  Infusions:  0.9 % NaCl with KCl 40 mEq / L 75 mL/hr at 06/01/20 0833   acyclovir 550 mg (06/01/20 1542)   famotidine (PEPCID) IV Stopped (06/14/2020 2212)     LOS: 1 day     Cordelia Poche, MD [facility] 06/01/2020, 4:10 PM  If 7PM-7AM, please contact night-coverage [email or web address]

## 2020-06-01 NOTE — ED Notes (Signed)
-  PT sister arrived this moment, hysterical, shouting that this RN has to call patient's daughter to discuss the surgery. -PT holds her hands up in protest, ask if she can get a paper  -She writes that on the paper what the ENT said and also writes on the paper not to talk to daughter

## 2020-06-01 NOTE — ED Notes (Signed)
Suction setup Education provided

## 2020-06-01 NOTE — ED Notes (Signed)
-  Patient's daughter at bedside. -She is very tearful at this time; stating that her mother needs to get a [employer] as she sometimes have issues with --she motions her left hand to her head. -Patient looks over at her daughter, shaking her head, stating "tha't a lie, I'm fine." Patient says she does not want to get a power of attorney, she says she can make her own decisions and she would tell the doctor what she wants. -Daughter is very tearful, refused to hug mother

## 2020-06-01 NOTE — Plan of Care (Signed)

## 2020-06-02 ENCOUNTER — Encounter (HOSPITAL_COMMUNITY): Payer: Self-pay | Admitting: Internal Medicine

## 2020-06-02 DIAGNOSIS — K209 Esophagitis, unspecified without bleeding: Secondary | ICD-10-CM | POA: Diagnosis not present

## 2020-06-02 DIAGNOSIS — I10 Essential (primary) hypertension: Secondary | ICD-10-CM | POA: Diagnosis not present

## 2020-06-02 DIAGNOSIS — E039 Hypothyroidism, unspecified: Secondary | ICD-10-CM | POA: Diagnosis not present

## 2020-06-02 DIAGNOSIS — J029 Acute pharyngitis, unspecified: Secondary | ICD-10-CM | POA: Diagnosis not present

## 2020-06-02 LAB — CBC WITH DIFFERENTIAL/PLATELET
Abs Immature Granulocytes: 0.05 10*3/uL (ref 0.00–0.07)
Basophils Absolute: 0 10*3/uL (ref 0.0–0.1)
Basophils Relative: 1 %
Eosinophils Absolute: 0 10*3/uL (ref 0.0–0.5)
Eosinophils Relative: 0 %
HCT: 32.1 % — ABNORMAL LOW (ref 36.0–46.0)
Hemoglobin: 10.9 g/dL — ABNORMAL LOW (ref 12.0–15.0)
Immature Granulocytes: 2 %
Lymphocytes Relative: 18 %
Lymphs Abs: 0.5 10*3/uL — ABNORMAL LOW (ref 0.7–4.0)
MCH: 33.6 pg (ref 26.0–34.0)
MCHC: 34 g/dL (ref 30.0–36.0)
MCV: 99.1 fL (ref 80.0–100.0)
Monocytes Absolute: 1.3 10*3/uL — ABNORMAL HIGH (ref 0.1–1.0)
Monocytes Relative: 44 %
Neutro Abs: 1 10*3/uL — ABNORMAL LOW (ref 1.7–7.7)
Neutrophils Relative %: 35 %
Platelets: 275 10*3/uL (ref 150–400)
RBC: 3.24 MIL/uL — ABNORMAL LOW (ref 3.87–5.11)
RDW: 14 % (ref 11.5–15.5)
WBC: 2.9 10*3/uL — ABNORMAL LOW (ref 4.0–10.5)
nRBC: 0 % (ref 0.0–0.2)

## 2020-06-02 LAB — GLUCOSE, CAPILLARY
Glucose-Capillary: 117 mg/dL — ABNORMAL HIGH (ref 70–99)
Glucose-Capillary: 107 mg/dL — ABNORMAL HIGH (ref 70–99)
Glucose-Capillary: 109 mg/dL — ABNORMAL HIGH (ref 70–99)

## 2020-06-02 MED ORDER — PHENOL 1.4 % MT LIQD
1.0000 | OROMUCOSAL | Status: DC | PRN
  Administered 2020-06-04: 1 via OROMUCOSAL
  Filled 2020-06-02: qty 177

## 2020-06-02 MED ORDER — OSMOLITE 1.2 CAL PO LIQD
1000.0000 mL | ORAL | Status: DC
  Filled 2020-06-02 (×2): qty 1000

## 2020-06-02 MED ORDER — SODIUM CHLORIDE 0.9 % IV SOLN: INTRAVENOUS | Status: DC

## 2020-06-02 NOTE — Progress Notes (Signed)
Patient ID: Katherine Wang, female   DOB: [birth year], 74 y.o.   MRN: [id number] She is awake and alert.  She is having a hard time swallowing and using the suction to clear out secretions.  Pain medicine is keeping her pain relatively well controlled.  Oral cavity and pharynx examination so far unchanged.  Breathing and voice are clear.  I think she is getting ready to have a nasogastric feeding tube placed which I think will be very helpful for her to get adequate nutrition in order to heal.  We will continue to follow.

## 2020-06-02 NOTE — Plan of Care (Signed)

## 2020-06-02 NOTE — Progress Notes (Signed)
Pt had coretrak feeding tube ordered and the team is not here today. Called dietician and IR, both said we would have to wait until tomorrow to have it placed. MD aware, will continue to monitor.

## 2020-06-02 NOTE — Progress Notes (Signed)
PROGRESS NOTE    Katherine Wang  KDT:[id number] DOB: [birth year] DOA: 06/08/2020 PCP: [name], MD   Brief Narrative: Katherine Wang is a 74 y.o. female history of hypertension, hyperlipidemia, hypothyroidism, anxiety, depression rheumatoid arthritis.  Patient presented secondary to worsening sore throat over the last 2 to 3 weeks and found to have multiple aphthous ulcers with evidence of pharyngitis and supraglottitis with associated upper cervical esophagitis.  ENT consult on admission with no recommendation for airway management at this time.   Assessment & Plan:   Active Problems:   Hypothyroidism   Essential hypertension   Anxiety   Viral pharyngitis   Hypokalemia   Supraglottitis   Esophagitis   Viral pharyngitis Esophagitis Supraglottitis In setting of leukopenia with neutropenia and lymphocytopenia. ENT consulted and recommendations for supportive care. -Continue acyclovir -ENT recommendations: supportive care  Leukopenia Neutropenia/Lymphocytopenia Mild. Patient was previously on methotrexate which appears to be the cause. Neutropenia is stable currently. With fever, may need to consider bacterial cause, however, with above findings, likely viral pharyngitis.   Odynophagia Secondary to above. Managed with IV pain medication. Unable to eat secondary to pain -Continue Dilaudid prn -NG Cortrak order placed; unavailable today; will need to be placed on 8/6 -CBG q4 hours while NPO  Hyponatremia Secondary to poor fluid intake. Improved with IV fluids.  Ground-glass nodule Noted incidentally on CT scan.  Nodule described as a 6 mm groundglass nodule in the left lung apex.  Recommendation for follow-up CT at 6 to 12 months.  Patient will need outpatient follow-up.  Hypothyroidism Patient is on Synthroid 112 mcg daily -Hold for now, if unable to take PO in a couple of days, will start IV equivalent dose of Synthroid  Essential hypertension Patient is on  amlodipine, hydrochlorothiazide and irbesartan as an outpatient. BP mostly controlled -Hydralazine prn  GERD Patient is on Pepcid and Protonix as an outpatient. Currently unable to swallow well secondary to above -Pepcid IV daily  Rheumatoid arthritis Patient was previously on methotrexate and is now on alirocumab as an outpatient.   DVT prophylaxis: Lovenox Code Status:   Code Status: Full Code Family Communication: Daughter on telephone Disposition Plan: Discharge home likely in several days pending improved ability to swallow and take in PO   Consultants:   ENT  Procedures:   None  Antimicrobials:  Acyclovir IV    Subjective: Continued pain with swallowing. Minimally improved.  Objective: Vitals:   06/01/20 1951 06/01/20 2340 06/02/20 0421 06/02/20 0854  BP: (!) 155/80 (!) 164/82 (!) 159/76 (!) 162/80  Pulse: 100 (!) 106 (!) 104 98  Resp: 18 16 18 17   Temp: 99.4 F (37.4 C) 98.6 F (37 C) 99.1 F (37.3 C) 100.1 F (37.8 C)  TempSrc: Oral Oral Oral Oral  SpO2: 98% 99% 100% 99%  Weight:      Height:        Intake/Output Summary (Last 24 hours) at 06/02/2020 1104 Last data filed at 06/02/2020 0540 Gross per 24 hour  Intake 2964.88 ml  Output --  Net 2964.88 ml   Filed Weights   06/21/2020 0834  Weight: 69 kg    Examination:  General exam: Appears calm and comfortable HEENT: oropharynx with ulcers. Decreased erythema. Respiratory system: Clear to auscultation. Respiratory effort normal. Cardiovascular system: S1 & S2 heard, RRR. No murmurs, rubs, gallops or clicks. Gastrointestinal system: Abdomen is nondistended, soft and nontender. No organomegaly or masses felt. Normal bowel sounds heard. Central nervous system: Alert and oriented. No focal  neurological deficits. Musculoskeletal: No edema. No calf tenderness Skin: No cyanosis. No rashes Psychiatry: Judgement and insight appear normal. Mood & affect appropriate.     Data Reviewed: I have  personally reviewed following labs and imaging studies  CBC Lab Results  Component Value Date   WBC 2.9 (L) 06/02/2020   RBC 3.24 (L) 06/02/2020   HGB 10.9 (L) 06/02/2020   HCT 32.1 (L) 06/02/2020   MCV 99.1 06/02/2020   MCH 33.6 06/02/2020   PLT 275 06/02/2020   MCHC 34.0 06/02/2020   RDW 14.0 06/02/2020   LYMPHSABS 0.5 (L) 06/02/2020   MONOABS 1.3 (H) 06/02/2020   EOSABS 0.0 06/02/2020   BASOSABS 0.0 94/50/3888     Last metabolic panel Lab Results  Component Value Date   NA 132 (L) 06/01/2020   K 3.6 06/01/2020   CL 99 06/01/2020   CO2 22 06/01/2020   BUN 19 06/01/2020   CREATININE 0.70 06/01/2020   GLUCOSE 126 (H) 06/01/2020   GFRNONAA >60 06/01/2020   GFRAA >60 06/01/2020   CALCIUM 8.2 (L) 06/01/2020   PROT 6.6 06/23/2020   ALBUMIN 3.0 (L) 06/19/2020   BILITOT 1.4 (H) 05/30/2020   ALKPHOS 59 06/17/2020   AST 27 06/16/2020   ALT 20 06/22/2020   ANIONGAP 11 06/01/2020    CBG (last 3)  No results for input(s): GLUCAP in the last 72 hours.   GFR: Estimated Creatinine Clearance: 51.9 mL/min (by C-G formula based on SCr of 0.7 mg/dL).  Coagulation Profile: No results for input(s): INR, PROTIME in the last 168 hours.  Recent Results (from the past 240 hour(s))  SARS Coronavirus 2 by RT PCR (hospital order, performed in [facility] hospital lab) Nasopharyngeal Nasopharyngeal Swab     Status: None   Collection Time: 06/02/2020 12:06 PM   Specimen: Nasopharyngeal Swab  Result Value Ref Range Status   SARS Coronavirus 2 NEGATIVE NEGATIVE Final    Comment: (NOTE) SARS-CoV-2 target nucleic acids are NOT DETECTED.  The SARS-CoV-2 RNA is generally detectable in upper and lower respiratory specimens during the acute phase of infection. The lowest concentration of SARS-CoV-2 viral copies this assay can detect is 250 copies / mL. A negative result does not preclude SARS-CoV-2 infection and should not be used as the sole basis for treatment or other patient management  decisions.  A negative result may occur with improper specimen collection / handling, submission of specimen other than nasopharyngeal swab, presence of viral mutation(s) within the areas targeted by this assay, and inadequate number of viral copies (<250 copies / mL). A negative result must be combined with clinical observations, patient history, and epidemiological information.  Fact Sheet for Patients:   StrictlyIdeas.no  Fact Sheet for Healthcare Providers: BankingDealers.co.za  This test is not yet approved or  cleared by the [place] FDA and has been authorized for detection and/or diagnosis of SARS-CoV-2 by FDA under an Emergency Use Authorization (EUA).  This EUA will remain in effect (meaning this test can be used) for the duration of the COVID-19 declaration under Section 564(b)(1) of the Act, 21 U.S.C. section 360bbb-3(b)(1), unless the authorization is terminated or revoked sooner.  Performed at [facility], [place] [address], [place],  [address]   Group A Strep by PCR     Status: None   Collection Time: 05/29/2020 12:06 PM  Result Value Ref Range Status   Group A Strep by PCR NOT DETECTED NOT DETECTED Final    Comment: Performed at [facility]  Lab, [address] [place], [place] [address]  Blood culture (routine x 2)     Status: None (Preliminary result)   Collection Time: 06/06/2020 12:33 PM   Specimen: BLOOD  Result Value Ref Range Status   Specimen Description BLOOD RIGHT ANTECUBITAL  Final   Special Requests   Final    BOTTLES DRAWN AEROBIC AND ANAEROBIC Blood Culture adequate volume   Culture   Final    NO GROWTH < 24 HOURS Performed at [facility], [place] [address], [place], [place] [address]    Report Status PENDING  Incomplete        Radiology Studies: CT Soft Tissue Neck W Contrast  Result Date: 06/24/2020 CLINICAL DATA:  Epiglottitis or tonsillitis suspected; worsening difficulty  breathing, sensation of "a ball" in throat, not able to tolerate saliva, fluids or breathing well. Viral appearing rash on back of throat. EXAM: CT NECK WITH CONTRAST TECHNIQUE: Multidetector CT imaging of the neck was performed using the standard protocol following the bolus administration of intravenous contrast. CONTRAST:  22mL OMNIPAQUE IOHEXOL 300 MG/ML  SOLN COMPARISON:  No pertinent prior exams are available for comparison. FINDINGS: Pharynx and larynx: There is diffuse abnormal mucosal enhancement along the palatine tonsils, within the oropharynx, within the vallecular and within the supraglottic larynx. There is also prominent abnormal mucosal enhancement within the hypopharynx and upper cervical esophagus. There is prominent mucosal/submucosal edema affecting the palatine tonsils, oropharynx, epiglottis and supraglottic larynx. No evidence of soft tissue abscess. No retropharyngeal collection Salivary glands: No inflammation, mass, or stone. Thyroid: Unremarkable. Lymph nodes: Mildly enlarged right level II lymph node measuring 12 mm in short axis (series 11, image 27). No pathologically enlarged cervical chain lymph nodes identified elsewhere within the neck. Vascular: The major vascular structures of the neck are patent. Calcified atherosclerotic plaque within the visualized aortic arch, proximal major branch vessels of the neck and carotid bifurcations. Aberrant right subclavian artery. Limited intracranial: No acute intracranial abnormality identified. Visualized orbits: Excluded from the field of view. Mastoids and visualized paranasal sinuses: No significant paranasal sinus disease or mastoid effusion at the imaged levels. Skeleton: Cervical spondylosis with multilevel disc space narrowing, posterior disc osteophytes, uncovertebral and facet hypertrophy. No high-grade bony spinal canal stenosis. Upper chest: 6 mm ground-glass nodule within the left lung apex. These results were called by telephone at  the time of interpretation on 06/09/2020 at 1:48 pm to provider [facility] , who verbally acknowledged these results. IMPRESSION: Imaging findings as described and most consistent with pharyngitis and supraglottitis as well as upper cervical esophagitis. There is prominent mucosal/submucosal edema affecting the palatine tonsils, oropharynx, epiglottis and supraglottic larynx. Resultant severe airway effacement at the level of the supraglottic larynx. No abscess or retropharyngeal collection is demonstrated. Mildly prominent right level II lymph node, possibly reactive. 6 mm ground-glass nodule within the left lung apex. Initial follow-up with CT at 6-12 months is recommended to confirm persistence. If persistent, repeat CT is recommended every 2 years until 5 years of stability has been established. This recommendation follows the consensus statement: Guidelines for Management of Incidental Pulmonary Nodules Detected on CT Images: From the Fleischner Society 2017; Radiology 2017; 284:228-243. Incidentally noted aberrant right subclavian artery. Electronically Signed   By: Kellie Simmering DO   On: 06/03/2020 13:59   DG CHEST PORT 1 VIEW  Result Date: 05/29/2020 CLINICAL DATA:  Cough. EXAM: PORTABLE CHEST 1 VIEW COMPARISON:  04/11/2020 FINDINGS: The cardiac silhouette, mediastinal and hilar contours are stable. Moderate tortuosity  and calcification of the thoracic aorta. The lungs are clear of an acute process. No pleural effusions or pulmonary infiltrates. No worrisome pulmonary lesions. The bony thorax is intact. IMPRESSION: No acute cardiopulmonary findings. Electronically Signed   By: Marijo Sanes M.D.   On: 06/14/2020 18:07        Scheduled Meds: . enoxaparin (LOVENOX) injection  40 mg Subcutaneous Q24H  . pantoprazole (PROTONIX) IV  40 mg Intravenous Daily  . sodium chloride flush  3 mL Intravenous Q12H   Continuous Infusions: . sodium chloride    . acyclovir 550 mg (06/02/20 0514)  .  famotidine (PEPCID) IV 20 mg (06/01/20 2105)  . feeding supplement (OSMOLITE 1.2 CAL)       LOS: 2 days     Cordelia Poche, MD [facility] 06/02/2020, 11:04 AM  If 7PM-7AM, please contact night-coverage [email or web address]

## 2020-06-03 ENCOUNTER — Encounter (HOSPITAL_COMMUNITY): Payer: Self-pay | Admitting: Internal Medicine

## 2020-06-03 DIAGNOSIS — B3781 Candidal esophagitis: Secondary | ICD-10-CM

## 2020-06-03 DIAGNOSIS — E876 Hypokalemia: Secondary | ICD-10-CM | POA: Diagnosis not present

## 2020-06-03 DIAGNOSIS — D709 Neutropenia, unspecified: Secondary | ICD-10-CM

## 2020-06-03 DIAGNOSIS — K209 Esophagitis, unspecified without bleeding: Secondary | ICD-10-CM | POA: Diagnosis not present

## 2020-06-03 DIAGNOSIS — J029 Acute pharyngitis, unspecified: Secondary | ICD-10-CM | POA: Diagnosis not present

## 2020-06-03 DIAGNOSIS — B370 Candidal stomatitis: Secondary | ICD-10-CM

## 2020-06-03 DIAGNOSIS — R1310 Dysphagia, unspecified: Secondary | ICD-10-CM

## 2020-06-03 DIAGNOSIS — M069 Rheumatoid arthritis, unspecified: Secondary | ICD-10-CM

## 2020-06-03 DIAGNOSIS — E039 Hypothyroidism, unspecified: Secondary | ICD-10-CM

## 2020-06-03 DIAGNOSIS — I10 Essential (primary) hypertension: Secondary | ICD-10-CM | POA: Diagnosis not present

## 2020-06-03 DIAGNOSIS — D649 Anemia, unspecified: Secondary | ICD-10-CM

## 2020-06-03 LAB — CBC WITH DIFFERENTIAL/PLATELET
Abs Immature Granulocytes: 0.05 10*3/uL (ref 0.00–0.07)
Basophils Absolute: 0 10*3/uL (ref 0.0–0.1)
Basophils Relative: 1 %
Eosinophils Absolute: 0 10*3/uL (ref 0.0–0.5)
Eosinophils Relative: 0 %
HCT: 30.4 % — ABNORMAL LOW (ref 36.0–46.0)
Hemoglobin: 10.2 g/dL — ABNORMAL LOW (ref 12.0–15.0)
Immature Granulocytes: 2 %
Lymphocytes Relative: 15 %
Lymphs Abs: 0.4 10*3/uL — ABNORMAL LOW (ref 0.7–4.0)
MCH: 33.2 pg (ref 26.0–34.0)
MCHC: 33.6 g/dL (ref 30.0–36.0)
MCV: 99 fL (ref 80.0–100.0)
Monocytes Absolute: 1 10*3/uL (ref 0.1–1.0)
Monocytes Relative: 40 %
Neutro Abs: 1.1 10*3/uL — ABNORMAL LOW (ref 1.7–7.7)
Neutrophils Relative %: 42 %
Platelets: 224 10*3/uL (ref 150–400)
RBC: 3.07 MIL/uL — ABNORMAL LOW (ref 3.87–5.11)
RDW: 14.4 % (ref 11.5–15.5)
WBC: 2.6 10*3/uL — ABNORMAL LOW (ref 4.0–10.5)
nRBC: 0 % (ref 0.0–0.2)

## 2020-06-03 LAB — GLUCOSE, CAPILLARY
Glucose-Capillary: 101 mg/dL — ABNORMAL HIGH (ref 70–99)
Glucose-Capillary: 101 mg/dL — ABNORMAL HIGH (ref 70–99)
Glucose-Capillary: 113 mg/dL — ABNORMAL HIGH (ref 70–99)
Glucose-Capillary: 124 mg/dL — ABNORMAL HIGH (ref 70–99)
Glucose-Capillary: 138 mg/dL — ABNORMAL HIGH (ref 70–99)
Glucose-Capillary: 147 mg/dL — ABNORMAL HIGH (ref 70–99)
Glucose-Capillary: 156 mg/dL — ABNORMAL HIGH (ref 70–99)

## 2020-06-03 LAB — BASIC METABOLIC PANEL
Anion gap: 13 (ref 5–15)
BUN: 8 mg/dL (ref 8–23)
CO2: 22 mmol/L (ref 22–32)
Calcium: 8.1 mg/dL — ABNORMAL LOW (ref 8.9–10.3)
Chloride: 97 mmol/L — ABNORMAL LOW (ref 98–111)
Creatinine, Ser: 0.69 mg/dL (ref 0.44–1.00)
GFR calc Af Amer: >60 mL/min (ref >60)
GFR calc non Af Amer: >60 mL/min (ref >60)
Glucose, Bld: 122 mg/dL — ABNORMAL HIGH (ref 70–99)
Potassium: 2.7 mmol/L — CL (ref 3.5–5.1)
Sodium: 132 mmol/L — ABNORMAL LOW (ref 135–145)

## 2020-06-03 LAB — HIV ANTIBODY (ROUTINE TESTING W REFLEX): HIV Screen 4th Generation wRfx: NONREACTIVE

## 2020-06-03 MED ORDER — FREE WATER
120.0000 mL | Freq: Four times a day (QID) | Status: DC
  Administered 2020-06-03 – 2020-06-07 (×17): 120 mL

## 2020-06-03 MED ORDER — FLUCONAZOLE IN SODIUM CHLORIDE 400-0.9 MG/200ML-% IV SOLN
400.0000 mg | Freq: Once | INTRAVENOUS | Status: AC
  Administered 2020-06-03: 400 mg via INTRAVENOUS
  Filled 2020-06-03: qty 200

## 2020-06-03 MED ORDER — POTASSIUM CHLORIDE 10 MEQ/100ML IV SOLN
10.0000 meq | INTRAVENOUS | Status: AC
  Administered 2020-06-03 (×4): 10 meq via INTRAVENOUS
  Filled 2020-06-03 (×4): qty 100

## 2020-06-03 MED ORDER — OSMOLITE 1.2 CAL PO LIQD
1000.0000 mL | ORAL | Status: DC
  Administered 2020-06-03 – 2020-06-07 (×3): 1000 mL
  Filled 2020-06-03 (×7): qty 1000

## 2020-06-03 MED ORDER — FLUCONAZOLE IN SODIUM CHLORIDE 200-0.9 MG/100ML-% IV SOLN
200.0000 mg | INTRAVENOUS | Status: DC
  Administered 2020-06-04: 200 mg via INTRAVENOUS
  Filled 2020-06-03 (×2): qty 100

## 2020-06-03 NOTE — Progress Notes (Signed)
Patient ID: Katherine Wang, female   DOB: 03-04-1946, 73 y.o.   MRN: [id number]  Awaiting nasogastric feeding tube.  She feels that there may be slightly less swelling today.  On exam, she is breathing clearly without any stridor or distress.  Voice is relatively clear.  Oral cavity and pharynx reveals persistent mucositis of the soft palate and posterior pharyngeal wall.  No masses or swelling in the neck.  Proceed with feeding tube.  We will continue to follow.

## 2020-06-03 NOTE — Procedures (Signed)
Cortrak  Person Inserting Tube:  Katherine Wang, RD Tube Type:  Cortrak - 43 inches Tube Location:  Left nare Initial Placement:  Stomach Secured by: Bridle Technique Used to Measure Tube Placement:  Documented cm marking at nare/ corner of mouth Cortrak Secured At:  61 cm   Cortrak Tube Team Note:  Consult received to place a Cortrak feeding tube.   No x-ray is required. RN may begin using tube.   If the tube becomes dislodged please keep the tube and contact the Cortrak team at [email or web address] (password [email or web address]) for replacement.  If after hours and replacement cannot be delayed, place a NG tube and confirm placement with an abdominal x-ray.    Katherine Wang RD, LDN Clinical Nutrition Pager listed in [place]

## 2020-06-03 NOTE — Consult Note (Addendum)
[facility]  [phone] [phone]    [place]  Referring MD: Dr. Cordelia Poche  Reason for Referral: Neutropenia, anemia, and circulating blasts noted on peripheral blood smear by pathology  HPI: Katherine Wang is a 74 year old female with a past medical history significant for hypertension, hyperlipidemia, hypothyroidism, anxiety, depression, and rheumatoid arthritis.  The patient presented to the emergency room with a sore throat.  Sore throat had been present for about 2 to 3 weeks and was worsening.  The patient is followed by rheumatology for her rheumatoid arthritis and they recently discontinued methotrexate and Arava and started the patient on Cimzia on 05/03/2020.  Within a week she developed a sore throat with sores.  She was evaluated as an outpatient on several occasions and was negative for Covid and strep.  She was tried on amoxicillin and was subsequently given doxycycline and had no improvement of her symptoms.  She had difficulty with coughing and getting choked up when trying to eat, drink, or take medications.  Reported generalized malaise and fevers up to 103 at home.  In the emergency room, she had a fever of 101.2 and was tachycardic.  Her CBC on admission showed a WBC of 2.4, ANC 1.2, hemoglobin 11.1, sodium 126, potassium 2.6.  Pathologist review of peripheral blood smear performed on 05/29/2020 shows atypical mononuclear cells with circulating blast, slight neutropenia with left shift. CT soft tissue neck with contrast performed on 06/18/2020 showed findings consistent with pharyngitis and supraglottitis as well as upper cervical esophagitis, mildly prominent right level 2 lymph node, possibly reactive, 6 mm groundglass nodule within the left lung apex-follow-up recommended in 6 to 12 months.  ENT consult obtained in the noted diffuse aphthous ulcers throughout the oral cavity and pharynx.  They felt her symptoms were related to  the new treatment that she had received and recommended supportive care.  The patient's most recent CBC available to me was performed on 05/28/2020 at that time her WBC was 2.3, hemoglobin 11.4, and ANC was 1.1.  In anticipation of starting the patient on Cimzia, rheumatology ordered SPEP with reflex, IgG, IgA, and IgM on 03/16/2020.  SPEP indicates that there was a poorly defined band of restricted protein mobility in the gammaglobulins, IFE interpretation showed no monoclonal protein detected.  IgG, IgA, and IgM levels were normal on this date.  The patient was seen in her hospital room today. No family at the bedside. She reports significant pain to her mouth and throat. Frustrated that she has not received her pain medication and is overdue for it. She also reports right arm swelling which she states has been there for a few days. Still having intermittent fevers but no reported chills. Still very fatigued. Has not noticed any night sweats. No lymphadenopathy reported. Denies headaches and dizziness. Denies chest pain and shortness of breath. Has coughing due to thick secretions. Reports that she has noticed a little bit of blood in her oral secretions likely due to suctioning. Denies abdominal pain, nausea, vomiting, constipation, diarrhea. No other bleeding reported other than blood in her oral secretions secondary to suctioning. The patient reports that she has been off methotrexate and Arava since 02/26/2020. She was started on Cimzia on 05/03/2020 and reports that she is only had 1 dose. Her symptoms seem to start within about a week of taking her first dose of this medication. Hematology was asked see the patient to make recommendations regarding her neutropenia, anemia, and circulating blasts noted on  peripheral blood smear per pathology.  Past Medical History:  Diagnosis Date   Adenomatous colon polyp 2012   Allergy    Anxiety    Aortic insufficiency    Arthritis    Basal cell carcinoma     Cataract    Depression    Diverticulosis    Fibromyalgia    Fibromyalgia    Hyperlipidemia    Hypertension    Macular degeneration    Melanoma ([place])    Murmur, heart    Reflux    Stroke ([place]) 20 years ago   per pt. mild stroke   Thyroid disease    hypothroidism   :    Past Surgical History:  Procedure Laterality Date   ABDOMINAL HYSTERECTOMY     APPENDECTOMY     BASAL CELL CARCINOMA EXCISION     nose    CARDIAC CATHETERIZATION     CHOLECYSTECTOMY     COLONOSCOPY     30 + years ago, unsure of where   EYE SURGERY     LASIK Bilateral    MELANOMA EXCISION     NASAL SEPTUM SURGERY     TUBAL LIGATION    :   CURRENT MEDS: Current Facility-Administered Medications  Medication Dose Route Frequency Provider Last Rate Last Admin   0.9 %  sodium chloride infusion   Intravenous Continuous Mariel Aloe, MD 75 mL/hr at 06/02/20 1228 New Bag at 06/02/20 1228   acetaminophen (TYLENOL) tablet 650 mg  650 mg Oral Q6H PRN Norval Morton, MD       Or   acetaminophen (TYLENOL) suppository 650 mg  650 mg Rectal Q6H PRN Fuller Plan A, MD       acyclovir (ZOVIRAX) 550 mg in dextrose 5 % 100 mL IVPB  550 mg Intravenous Q8H Smith, Rondell A, MD 111 mL/hr at 06/03/20 0640 550 mg at 06/03/20 0640   albuterol (PROVENTIL) (2.5 MG/3ML) 0.083% nebulizer solution 2.5 mg  2.5 mg Nebulization Q6H PRN Fuller Plan A, MD       benzocaine (ORAJEL) 10 % mucosal gel   Mouth/Throat QID PRN Norval Morton, MD   Given at 06/01/20 0830   enoxaparin (LOVENOX) injection 40 mg  40 mg Subcutaneous Q24H Smith, Rondell A, MD   40 mg at 06/10/2020 1958   famotidine (PEPCID) IVPB 20 mg premix  20 mg Intravenous Q24H Smith, Rondell A, MD 100 mL/hr at 06/02/20 1831 20 mg at 06/02/20 1831   feeding supplement (OSMOLITE 1.2 CAL) liquid 1,000 mL  1,000 mL Per Tube Continuous Mariel Aloe, MD   Held at 06/02/20 1219   hydrALAZINE (APRESOLINE) injection 10 mg  10 mg Intravenous Q6H PRN Mariel Aloe, MD        HYDROmorphone (DILAUDID) injection 0.5 mg  0.5 mg Intravenous Q3H PRN Fuller Plan A, MD   0.5 mg at 06/02/20 2104   LORazepam (ATIVAN) injection 0.25-0.5 mg  0.25-0.5 mg Intravenous Q6H PRN Fuller Plan A, MD       metoprolol tartrate (LOPRESSOR) injection 2.5 mg  2.5 mg Intravenous Q4H PRN Tamala Julian, Rondell A, MD       ondansetron (ZOFRAN) tablet 4 mg  4 mg Oral Q6H PRN Fuller Plan A, MD       Or   ondansetron (ZOFRAN) injection 4 mg  4 mg Intravenous Q6H PRN Smith, Rondell A, MD       pantoprazole (PROTONIX) injection 40 mg  40 mg Intravenous Daily Mariel Aloe, MD   40  mg at 06/02/20 0911   phenol (CHLORASEPTIC) mouth spray 1 spray  1 spray Mouth/Throat PRN Mariel Aloe, MD       sodium chloride flush (NS) 0.9 % injection 3 mL  3 mL Intravenous Q12H Smith, Rondell A, MD   3 mL at 06/02/20 2200      Allergies  Allergen Reactions   Tramadol Other (See Comments)    dizzy   Statins     Body ache   :  Family History  Problem Relation Age of Onset   Heart disease Mother    Hypertension Mother    Mental illness Mother    Allergies Mother    Heart disease Father    Hypertension Father    Mental illness Father    Emphysema Father        smoked   Allergies Father    Heart attack Father    Hyperlipidemia Sister    Hypertension Sister    Allergies Sister    Cancer Sister        oral   Hypertension Sister    Diabetes Sister    Allergies Sister    Epilepsy Son    Aneurysm Son    Hyperthyroidism Daughter    Hypothyroidism Daughter    Hypertension Daughter    Basal cell carcinoma Daughter    Colon cancer Neg Hx    Esophageal cancer Neg Hx    Rectal cancer Neg Hx    Stomach cancer Neg Hx    Pancreatic cancer Neg Hx    Prostate cancer Neg Hx    :  Social History   Socioeconomic History   Marital status: Divorced    Spouse name: Not on file   Number of children: 3   Years of education: Not on file   Highest education level: Not on file  Occupational  History   Occupation: retired  Tobacco Use   Smoking status: Former Smoker    Packs/day: 2.00    Years: 20.00    Pack years: 40.00    Types: Cigarettes    Quit date: 10/30/1983    Years since quitting: 36.6   Smokeless tobacco: Never Used  Vaping Use   Vaping Use: Never used  Substance and Sexual Activity   Alcohol use: No    Alcohol/week: 0.0 standard drinks   Drug use: Never   Sexual activity: Not on file  Other Topics Concern   Not on file  Social History Narrative   Lives alone in a one story home.  Has 2 living children.  Retired Engineer, water.     Investment banker, operational of Radio broadcast assistant Strain:    Difficulty of Paying Living Expenses:   Food Insecurity:    Worried About Charity fundraiser in the Last Year:    Arboriculturist in the Last Year:   Transportation Needs:    Film/video editor (Medical):    Lack of Transportation (Non-Medical):   Physical Activity:    Days of Exercise per Week:    Minutes of Exercise per Session:   Stress:    Feeling of Stress :   Social Connections:    Frequency of Communication with Friends and Family:    Frequency of Social Gatherings with Friends and Family:    Attends Religious Services:    Active Member of Clubs or Organizations:    Attends Archivist Meetings:    Marital Status:   Intimate Partner Violence:    Fear of  Current or Ex-Partner:    Emotionally Abused:    Physically Abused:    Sexually Abused:   :  REVIEW OF SYSTEMS:  A comprehensive 14 point review of systems was negative except as noted in the HPI.    Exam: Patient Vitals for the past 24 hrs:  BP Temp Temp src Pulse Resp SpO2 Weight  06/03/20 0500 -- -- -- -- -- -- 73.9 kg  06/03/20 0339 (!) 178/88 100.2 F (37.9 C) Oral (!) 110 20 97 % --  06/02/20 2150 (!) 153/85 98.6 F (37 C) Oral (!) 110 20 94 % --  06/02/20 1608 (!) 156/76 (!) 100.9 F (38.3 C) Oral 99 16 98 % --  06/02/20 0854 (!) 162/80 100.1 F (37.8 C) Oral 98 17 99  % --    General: Elderly female who appears to have pain Eyes:  no scleral icterus.   ENT: Oral candidiasis noted, erythema noted, multiple aphthous ulcers noted in her oral cavity Neck was without thyromegaly.   Lymphatics:  Negative cervical, supraclavicular or axillary adenopathy.   Respiratory: lungs were clear bilaterally without wheezing or crackles.   Cardiovascular: Tachycardic, right arm edematous, no edema in the bilateral lower extremities or left arm GI:  abdomen was soft, flat, nontender, nondistended, without organomegaly. Musculoskeletal: Strength symmetrical in the upper and lower extremities Skin exam was without ecchymosis, petechiae.   Neuro exam was nonfocal.  Patient was alert and oriented.   LABS:  Lab Results  Component Value Date   WBC 2.9 (L) 06/02/2020   HGB 10.9 (L) 06/02/2020   HCT 32.1 (L) 06/02/2020   PLT 275 06/02/2020   GLUCOSE 126 (H) 06/01/2020   CHOL 164 11/30/2019   TRIG 76 11/30/2019   HDL 52 11/30/2019   LDLCALC 97 11/30/2019   ALT 20 06/04/2020   AST 27 05/30/2020   NA 132 (L) 06/01/2020   K 3.6 06/01/2020   CL 99 06/01/2020   CREATININE 0.70 06/01/2020   BUN 19 06/01/2020   CO2 22 06/01/2020   HGBA1C 5.9 08/13/2018    CT Soft Tissue Neck W Contrast  Result Date: 06/07/2020 CLINICAL DATA:  Epiglottitis or tonsillitis suspected; worsening difficulty breathing, sensation of "a ball" in throat, not able to tolerate saliva, fluids or breathing well. Viral appearing rash on back of throat. EXAM: CT NECK WITH CONTRAST TECHNIQUE: Multidetector CT imaging of the neck was performed using the standard protocol following the bolus administration of intravenous contrast. CONTRAST:  70m OMNIPAQUE IOHEXOL 300 MG/ML  SOLN COMPARISON:  No pertinent prior exams are available for comparison. FINDINGS: Pharynx and larynx: There is diffuse abnormal mucosal enhancement along the palatine tonsils, within the oropharynx, within the vallecular and within the  supraglottic larynx. There is also prominent abnormal mucosal enhancement within the hypopharynx and upper cervical esophagus. There is prominent mucosal/submucosal edema affecting the palatine tonsils, oropharynx, epiglottis and supraglottic larynx. No evidence of soft tissue abscess. No retropharyngeal collection Salivary glands: No inflammation, mass, or stone. Thyroid: Unremarkable. Lymph nodes: Mildly enlarged right level II lymph node measuring 12 mm in short axis (series 11, image 27). No pathologically enlarged cervical chain lymph nodes identified elsewhere within the neck. Vascular: The major vascular structures of the neck are patent. Calcified atherosclerotic plaque within the visualized aortic arch, proximal major branch vessels of the neck and carotid bifurcations. Aberrant right subclavian artery. Limited intracranial: No acute intracranial abnormality identified. Visualized orbits: Excluded from the field of view. Mastoids and visualized paranasal sinuses: No significant paranasal sinus  disease or mastoid effusion at the imaged levels. Skeleton: Cervical spondylosis with multilevel disc space narrowing, posterior disc osteophytes, uncovertebral and facet hypertrophy. No high-grade bony spinal canal stenosis. Upper chest: 6 mm ground-glass nodule within the left lung apex. These results were called by telephone at the time of interpretation on 06/06/2020 at 1:48 pm to provider [facility] , who verbally acknowledged these results. IMPRESSION: Imaging findings as described and most consistent with pharyngitis and supraglottitis as well as upper cervical esophagitis. There is prominent mucosal/submucosal edema affecting the palatine tonsils, oropharynx, epiglottis and supraglottic larynx. Resultant severe airway effacement at the level of the supraglottic larynx. No abscess or retropharyngeal collection is demonstrated. Mildly prominent right level II lymph node, possibly reactive. 6 mm  ground-glass nodule within the left lung apex. Initial follow-up with CT at 6-12 months is recommended to confirm persistence. If persistent, repeat CT is recommended every 2 years until 5 years of stability has been established. This recommendation follows the consensus statement: Guidelines for Management of Incidental Pulmonary Nodules Detected on CT Images: From the Fleischner Society 2017; Radiology 2017; 284:228-243. Incidentally noted aberrant right subclavian artery. Electronically Signed   By: Kellie Simmering DO   On: 06/23/2020 13:59   CT Chest High Resolution  Result Date: 05/11/2020 CLINICAL DATA:  Cough.  Rheumatoid arthritis. EXAM: CT CHEST WITHOUT CONTRAST TECHNIQUE: Multidetector CT imaging of the chest was performed following the standard protocol without intravenous contrast. High resolution imaging of the lungs, as well as inspiratory and expiratory imaging, was performed. COMPARISON:  04/13/2020, 06/17/2019. FINDINGS: Cardiovascular: Aberrant right subclavian artery. Atherosclerotic calcification of the aorta, aortic valve and coronary arteries. Ascending aorta measures 4.0 cm. Heart size normal. No pericardial effusion. Mediastinum/Nodes: No pathologically enlarged mediastinal or axillary lymph nodes. Hilar regions are difficult to evaluate without IV contrast. Esophagus is unremarkable. Lungs/Pleura: Minimal biapical pleuroparenchymal scarring. Negative for subpleural reticulation, traction bronchiectasis/bronchiolectasis, ground-glass, architectural distortion or honeycombing. Mild centrilobular emphysema. 7 mm ground-glass nodule in the lateral right lower lobe (10/92) may be slightly more conspicuous than on comparison exams. No solid components. 4 mm ground-glass left upper lobe nodule (10/23), unchanged from 09/11/2006 and considered benign. Calcified granuloma in the left upper lobe. No pleural fluid. Airway is unremarkable. Minimal air trapping at the lung bases. Upper Abdomen:  Visualized portions of the liver and right adrenal gland are unremarkable. Thickening of the left adrenal gland. Visualized portions of the kidneys, spleen, pancreas, stomach and bowel are grossly unremarkable with the exception of a tiny hiatal hernia. Cholecystectomy. No upper abdominal adenopathy. Musculoskeletal: Degenerative changes in the spine. No worrisome lytic or sclerotic lesions. IMPRESSION: 1. No evidence of interstitial lung disease. 2. Minimal basilar air trapping, indicative of small airways disease. 3. Right lower lobe ground-glass nodule, slightly more conspicuous than on prior exams. No solid components. Adenocarcinoma cannot be excluded. Follow up by CT is recommended in 12 months, with continued annual surveillance for a minimum of 3 years. These recommendations are taken from: Recommendations for the Management of Subsolid Pulmonary Nodules Detected at CT: A Statement from the [place] [facility] 2013; 266:1, 304-317. 4. Ascending Aortic aneurysm NOS (ICD10-I71.9). Recommend annual imaging followup by CTA or MRA. This recommendation follows 2010 ACCF/AHA/AATS/ACR/ASA/SCA/SCAI/SIR/STS/SVM Guidelines for the Diagnosis and Management of Patients with Thoracic Aortic Disease. Circulation. 2010; 121: O676-H209. Aortic aneurysm NOS (ICD10-I71.9). 5. Aortic atherosclerosis (ICD10-I70.0). Coronary artery calcification. 6.  Emphysema (ICD10-J43.9). Electronically Signed   By: Lorin Picket M.D.   On: 05/11/2020 16:31   DG CHEST PORT  1 VIEW  Result Date: 06/22/2020 CLINICAL DATA:  Cough. EXAM: PORTABLE CHEST 1 VIEW COMPARISON:  04/11/2020 FINDINGS: The cardiac silhouette, mediastinal and hilar contours are stable. Moderate tortuosity and calcification of the thoracic aorta. The lungs are clear of an acute process. No pleural effusions or pulmonary infiltrates. No worrisome pulmonary lesions. The bony thorax is intact. IMPRESSION: No acute cardiopulmonary findings. Electronically Signed    By: Marijo Sanes M.D.   On: 06/03/2020 18:07   ASSESSMENT AND PLAN:  This is a 74 year old female with:  1.  Neutropenia: Review of the patient's labs indicate that the patient has been persistently neutropenic since at least January 2019. However, she was on methotrexate and Arava. Her neutropenia worsened in April 2021 and these drugs were stopped. She was subsequently placed on prednisone for treatment of RA and her WBC eventually normalized. Now has neutropenia again after starting Cimzia. WBC initially dropped compared to admission but is slowly trending upward. Pathology indicated circulating blasts on peripheral blood smear. Suspect neutropenia related to Cimzia and that neutropenia will slowly resolve once medication is out of her system. Recommend close monitoring of CBC with differential. Further recommendations per Dr. Alen Blew later today.  2.  Normocytic anemia: The patient has had intermittent anemia in the past, but has not been persistently anemic. Hemoglobin is relatively steady this admission. Likely due to medications and acute illness. Monitor closely.  3.  Rheumatoid arthritis: Recently started on Cimzia and received 1 dose. Further management per rheumatology as outpatient.  4.  Viral pharyngitis, esophagitis, supraglottitis: Significant pain and ulcerations. ENT following and is currently on valacyclovir.   5. Right arm edema: The patient reports right arm edema has been present for several days. Discussed with hospitalist who will further evaluate. Recommend Doppler ultrasound of right upper extremity to evaluate for DVT.  6. Oral candidiasis: Recommend antifungal for treatment of oral candidiasis. Discussed with hospitalist.  7.  Hypertension: Oral medications are on hold secondary to inability to swallow. On hydralazine as needed.  8.  Hypothyroidism: Synthroid on hold currently. Hospitalist considering IV equivalent dose of Synthroid if she is unable to eat for more than  a few days.   Thank you for this referral.  Mikey Bussing, DNP, AGPCNP-BC, AOCNP Mon/Tues/Thurs/Fri 7am-5pm; Off Wednesdays Cell: [phone]   Oncology addendum note:   Patient seen and examined personally.  She continues to report mild pain and discomfort associated with her oral ulcers.  On exam she was awake alert chronically ill but not in any distress.  Laboratory data were personally reviewed and showed normal CBC other than her white cell count which is recovering with monocytosis on the differential.  Peripheral smear was personally reviewed and does show monocytosis with rare blasts.  The differential diagnosis of these findings were reviewed today with the patient.  Reactive bone marrow findings are typical associated with recovery after methotrexate myelosuppression.  It is also possible that she could be developing bone marrow disorder such as MDS and less likely leukemia.  I recommended continued observation for the time being and monitoring her counts closely while she is hospitalized.  She continues to have increased monocytosis or increased blasts, a bone marrow biopsy may be needed to further evaluate her condition.  She understands that and she is in agreement for the time being.  All her questions were answered to her satisfaction.

## 2020-06-03 NOTE — Plan of Care (Signed)

## 2020-06-03 NOTE — Progress Notes (Signed)
Initial Nutrition Assessment  DOCUMENTATION CODES:   Obesity unspecified  INTERVENTION:   Initiate Osmolite 1.2 @ 20 ml/hr via cortrak tube and increase by 10 ml every 4 hours to goal rate of 60 ml/hr.   150 ml free water flush every 6 hours  Tube feeding regimen provides 1728 kcal (100% of needs), 80 grams of protein, and 1180 ml of H2O. Total free water: 1660 ml daily  NUTRITION DIAGNOSIS:   Inadequate oral intake related to inability to eat as evidenced by NPO status.  GOAL:   Patient will meet greater than or equal to 90% of their needs  MONITOR:   Diet advancement, Labs, Weight trends, Skin, I & O's, TF tolerance  REASON FOR ASSESSMENT:   Consult Enteral/tube feeding initiation and management  ASSESSMENT:   Katherine Wang is a 74 y.o. female with medical history significant of hypertension, hyperlipidemia, hypothyroidism, anxiety, depression, and rheumatoid arthritis presents with complaints of worsening sore throat over the last 2-3 weeks.  Pt admitted with vital pharyngitis, supraglottitis, and cervical esophagitis.   8/6- cortrak tube placed (tip of tube in stomach)  Reviewed I/O's: +376 ml x 24 hours and +4.3 L since admission  Spoke with pt and best friend at bedside, who reports that pt has had decreased oral intake secondary to painful swallowing over the past 3 weeks. Friend explains that pt is typically a grazer and eats very well, however, has had almost no intake other than some broth and a few bites of applesauce and pudding over the past week. She was taken to the ED last week and given antibiotics with magic mouthwash, which did not improve her symptoms.   RD explained purpose of cortrak tube to achieve adequate nutrition while pt unable to eat by mouth. Educated pt and friend how pt will receive nutrition.  Reviewed wt hx; wt has been stable over the past 3 months.   Lab Results  Component Value Date   HGBA1C 5.9 08/13/2018   PTA DM medications are  none.   Labs reviewed: CBGS: 101-113 (inpatient orders for glycemic control are none).   NUTRITION - FOCUSED PHYSICAL EXAM:    Most Recent Value  Orbital Region No depletion  Upper Arm Region No depletion  Thoracic and Lumbar Region No depletion  Buccal Region No depletion  Temple Region No depletion  Clavicle Bone Region No depletion  Clavicle and Acromion Bone Region No depletion  Scapular Bone Region No depletion  Dorsal Hand No depletion  Patellar Region No depletion  Anterior Thigh Region No depletion  Posterior Calf Region No depletion  Edema (RD Assessment) Mild  Hair Reviewed  Eyes Reviewed  Mouth Reviewed  Skin Reviewed  Nails Reviewed       Diet Order:   Diet Order            Diet NPO time specified Except for: Other (See Comments)  Diet effective now                 EDUCATION NEEDS:   Education needs have been addressed  Skin:  Skin Assessment: Reviewed RN Assessment  Last BM:  05/30/20  Height:   Ht Readings from Last 1 Encounters:  06/25/2020 5' (1.524 m)    Weight:   Wt Readings from Last 1 Encounters:  06/03/20 73.9 kg    Ideal Body Weight:  45.5 kg  BMI:  Body mass index is 31.82 kg/m.  Estimated Nutritional Needs:   Kcal:  1650-1850  Protein:  75-90 grams  Fluid:  > 1.6 L   Loistine Chance, RD, LDN, [place] Registered Dietitian II Certified Diabetes Care and Education Specialist Please refer to [facility] for RD and/or RD on-call/weekend/after hours pager

## 2020-06-03 NOTE — Progress Notes (Signed)
Pt refused Lovenox, saying, " I can't have another biological product in my body."

## 2020-06-03 NOTE — Plan of Care (Signed)
°  Problem: Education: Goal: Knowledge of General Education information will improve Description: Including pain rating scale, medication(s)/side effects and non-pharmacologic comfort measures Outcome: Progressing   Problem: Health Behavior/Discharge Planning: Goal: Ability to manage health-related needs will improve Outcome: Progressing   Problem: Clinical Measurements: Goal: Ability to maintain clinical measurements within normal limits will improve Outcome: Progressing   Problem: Coping: Goal: Level of anxiety will decrease Outcome: Progressing   

## 2020-06-03 NOTE — Progress Notes (Signed)
PROGRESS NOTE    Katherine Wang  PJA:[id number] DOB: [birth year] DOA: 06/18/2020 PCP: [name], MD   Brief Narrative: Katherine Wang is a 73 y.o. female history of hypertension, hyperlipidemia, hypothyroidism, anxiety, depression rheumatoid arthritis.  Patient presented secondary to worsening sore throat over the last 2 to 3 weeks and found to have multiple aphthous ulcers with evidence of pharyngitis and supraglottitis with associated upper cervical esophagitis.  ENT consult on admission with no recommendation for airway management at this time.   Assessment & Plan:   Active Problems:   Hypothyroidism   Essential hypertension   Anxiety   Viral pharyngitis   Hypokalemia   Supraglottitis   Esophagitis   Viral pharyngitis Esophagitis Supraglottitis In setting of leukopenia with neutropenia and lymphocytopenia. ENT consulted and recommendations for supportive care. Blood smear significant for atypical mononuclear cells and blast cells -Continue acyclovir -ENT recommendations: supportive care -Check EBV, CMV  Leukopenia Neutropenia/Lymphocytopenia Mild. Patient was previously on methotrexate which appears to be the cause. Neutropenia is stable currently. With fever, may need to consider bacterial cause, however, with above findings, likely viral pharyngitis. Will need to consider EBV. Hematology consulted for below and further recommendations appreciated.  Blast cells Concerning. -Oncology consult  Odynophagia Secondary to above. Managed with IV pain medication. Unable to eat secondary to pain. Patient with visible thrush; possibly an element of esophageal esophagitis -Continue Dilaudid prn -NG Cortrak order placed; unavailable today; will need to be placed on 8/6 -CBG q4 hours while NPO -Fluconazole 400 mg IV x1, followed by fluconazole 200 mg daily (IV until able to take oral mediations when NG is placed)  Oral thrush Possible esophageal esophagitis Appears to be  new. Possibly associated esophageal candidiasis with odynophagia -Fluconazole IV  Hyponatremia Secondary to poor fluid intake. Improved with IV fluids.  Ground-glass nodule Noted incidentally on CT scan.  Nodule described as a 6 mm groundglass nodule in the left lung apex.  Recommendation for follow-up CT at 6 to 12 months.  Patient will need outpatient follow-up.  Hypothyroidism Patient is on Synthroid 112 mcg daily -Continue Synthroid once NG tube place  Essential hypertension Patient is on amlodipine, hydrochlorothiazide and irbesartan as an outpatient. BP mostly controlled -Hydralazine prn  GERD Patient is on Pepcid and Protonix as an outpatient. Currently unable to swallow well secondary to above -Pepcid IV daily  Rheumatoid arthritis Patient was previously on methotrexate and is now on alirocumab as an outpatient.   DVT prophylaxis: Lovenox Code Status:   Code Status: Full Code Family Communication: Daughter on telephone. No response. Disposition Plan: Discharge home likely in several days pending improved ability to swallow and take in PO   Consultants:   ENT  Oncology  Procedures:   None  Antimicrobials:  Acyclovir IV    Subjective: Continued pain. Not much change. Right arm swelling.  Objective: Vitals:   06/02/20 1608 06/02/20 2150 06/03/20 0339 06/03/20 0500  BP: (!) 156/76 (!) 153/85 (!) 178/88   Pulse: 99 (!) 110 (!) 110   Resp: 16 20 20    Temp: (!) 100.9 F (38.3 C) 98.6 F (37 C) 100.2 F (37.9 C)   TempSrc: Oral Oral Oral   SpO2: 98% 94% 97%   Weight:    73.9 kg  Height:        Intake/Output Summary (Last 24 hours) at 06/03/2020 0801 Last data filed at 06/02/2020 1600 Gross per 24 hour  Intake 376 ml  Output --  Net 376 ml   [employer]  06/16/2020 0834 06/03/20 0500  Weight: 69 kg 73.9 kg    Examination:  General exam: Appears calm and comfortable HEENT: Oral ulcers present without significant erythema. Thrush present.  Scrapes off. Respiratory system: Clear to auscultation. Respiratory effort normal. Cardiovascular system: S1 & S2 heard, RRR. No murmurs, rubs, gallops or clicks. Gastrointestinal system: Abdomen is nondistended, soft and nontender. No organomegaly or masses felt. Normal bowel sounds heard. Central nervous system: Alert and oriented. No focal neurological deficits. Musculoskeletal: Right arm edema. No calf tenderness Skin: No cyanosis. No rashes Psychiatry: Judgement and insight appear normal. Mood & affect appropriate.     Data Reviewed: I have personally reviewed following labs and imaging studies  CBC Lab Results  Component Value Date   WBC 2.9 (L) 06/02/2020   RBC 3.24 (L) 06/02/2020   HGB 10.9 (L) 06/02/2020   HCT 32.1 (L) 06/02/2020   MCV 99.1 06/02/2020   MCH 33.6 06/02/2020   PLT 275 06/02/2020   MCHC 34.0 06/02/2020   RDW 14.0 06/02/2020   LYMPHSABS 0.5 (L) 06/02/2020   MONOABS 1.3 (H) 06/02/2020   EOSABS 0.0 06/02/2020   BASOSABS 0.0 35/59/7416     Last metabolic panel Lab Results  Component Value Date   NA 132 (L) 06/01/2020   K 3.6 06/01/2020   CL 99 06/01/2020   CO2 22 06/01/2020   BUN 19 06/01/2020   CREATININE 0.70 06/01/2020   GLUCOSE 126 (H) 06/01/2020   GFRNONAA >60 06/01/2020   GFRAA >60 06/01/2020   CALCIUM 8.2 (L) 06/01/2020   PROT 6.6 06/01/2020   ALBUMIN 3.0 (L) 06/11/2020   BILITOT 1.4 (H) 06/05/2020   ALKPHOS 59 06/21/2020   AST 27 06/11/2020   ALT 20 05/29/2020   ANIONGAP 11 06/01/2020    CBG (last 3)  Recent Labs    06/03/20 0008 06/03/20 0343 06/03/20 0752  GLUCAP 113* 101* 124*     GFR: Estimated Creatinine Clearance: 53.7 mL/min (by C-G formula based on SCr of 0.7 mg/dL).  Coagulation Profile: No results for input(s): INR, PROTIME in the last 168 hours.  Recent Results (from the past 240 hour(s))  SARS Coronavirus 2 by RT PCR (hospital order, performed in [facility] hospital lab) Nasopharyngeal Nasopharyngeal Swab      Status: None   Collection Time: 06/10/2020 12:06 PM   Specimen: Nasopharyngeal Swab  Result Value Ref Range Status   SARS Coronavirus 2 NEGATIVE NEGATIVE Final    Comment: (NOTE) SARS-CoV-2 target nucleic acids are NOT DETECTED.  The SARS-CoV-2 RNA is generally detectable in upper and lower respiratory specimens during the acute phase of infection. The lowest concentration of SARS-CoV-2 viral copies this assay can detect is 250 copies / mL. A negative result does not preclude SARS-CoV-2 infection and should not be used as the sole basis for treatment or other patient management decisions.  A negative result may occur with improper specimen collection / handling, submission of specimen other than nasopharyngeal swab, presence of viral mutation(s) within the areas targeted by this assay, and inadequate number of viral copies (<250 copies / mL). A negative result must be combined with clinical observations, patient history, and epidemiological information.  Fact Sheet for Patients:   StrictlyIdeas.no  Fact Sheet for Healthcare Providers: BankingDealers.co.za  This test is not yet approved or  cleared by the [place] FDA and has been authorized for detection and/or diagnosis of SARS-CoV-2 by FDA under an Emergency Use Authorization (EUA).  This EUA will remain in effect (meaning this test can be  used) for the duration of the COVID-19 declaration under Section 564(b)(1) of the Act, 21 U.S.C. section 360bbb-3(b)(1), unless the authorization is terminated or revoked sooner.  Performed at [facility], [place] [address] [place], [place] [address]   Group A Strep by PCR     Status: None   Collection Time: 05/30/2020 12:06 PM  Result Value Ref Range Status   Group A Strep by PCR NOT DETECTED NOT DETECTED Final    Comment: Performed at [facility], [place] [address] [place], [place] [address]  Blood culture (routine x 2)      Status: None (Preliminary result)   Collection Time: 06/20/2020 12:33 PM   Specimen: BLOOD  Result Value Ref Range Status   Specimen Description BLOOD RIGHT ANTECUBITAL  Final   Special Requests   Final    BOTTLES DRAWN AEROBIC AND ANAEROBIC Blood Culture adequate volume   Culture   Final    NO GROWTH 2 DAYS Performed at [facility], [place] [address] [place],  [address]    Report Status PENDING  Incomplete        Radiology Studies: No results found.      Scheduled Meds: . enoxaparin (LOVENOX) injection  40 mg Subcutaneous Q24H  . pantoprazole (PROTONIX) IV  40 mg Intravenous Daily  . sodium chloride flush  3 mL Intravenous Q12H   Continuous Infusions: . sodium chloride 75 mL/hr at 06/02/20 1228  . acyclovir 550 mg (06/03/20 0640)  . famotidine (PEPCID) IV 20 mg (06/02/20 1831)  . feeding supplement (OSMOLITE 1.2 CAL) Stopped (06/02/20 1219)     LOS: 3 days     Cordelia Poche, MD [facility] 06/03/2020, 8:01 AM  If 7PM-7AM, please contact night-coverage [email or web address]

## 2020-06-03 NOTE — Progress Notes (Signed)
CRITICAL VALUE ALERT  Critical Value: potassium, 2.7  Date & Time Notied: 06/03/20  Provider Notified:nettey  Orders Received/Actions taken: see new orders

## 2020-06-03 NOTE — Progress Notes (Signed)
Pharmacy Antibiotic Note  Katherine Wang is a 74 y.o. female with history of rheumatoid arthritis on Cimzia (started 05/03/20) admitted on 06/26/2020 with sore throat and found to have viral pharyngitis, esophagitis, supraglottitis. Pharmacy has been consulted for acyclovir dosing.  Today, patient still reporting significant mouth/throat pain, ulcerations, and fatigue, Tmax 100.9, neutropenic.  Plan: Acyclovir IV 550 mg IV (10mg /kg AdjBW) q8h Monitor clinical picture, renal function F/U C&S, abx de-escalation, LOT    Height: 5' (152.4 cm) Weight: 73.9 kg (162 lb 14.7 oz) IBW/kg (Calculated) : 45.5  Temp (24hrs), Avg:99.9 F (37.7 C), Min:98.6 F (37 C), Max:100.9 F (38.3 C)  Recent Labs  Lab 05/28/20 1440 05/30/2020 1225 06/14/2020 1230 06/01/20 0525 06/02/20 0324 06/03/20 0822  WBC 2.3*  --  2.4* 1.9*  1.9* 2.9* 2.6*  CREATININE 0.68  --  0.80 0.70  --  0.69  LATICACIDVEN  --  1.1  --   --   --   --     Estimated Creatinine Clearance: 53.7 mL/min (by C-G formula based on SCr of 0.69 mg/dL).    Allergies  Allergen Reactions  . Tramadol Other (See Comments)    dizzy  . Statins     Body ache    Antimicrobials this admission: 8/3 acyclovir >>  Microbiology results: 8/3 HSV culture and typing  8/3 BCs: ngtd 8/3 Group A strep by PCR - not detected   Brendolyn Patty, PharmD Clinical Pharmacist  06/03/2020   10:45 AM   Please check AMION for all [place] phone numbers After 10:00 PM, call the [place] [phone]

## 2020-06-04 DIAGNOSIS — E039 Hypothyroidism, unspecified: Secondary | ICD-10-CM | POA: Diagnosis not present

## 2020-06-04 DIAGNOSIS — K209 Esophagitis, unspecified without bleeding: Secondary | ICD-10-CM | POA: Diagnosis not present

## 2020-06-04 DIAGNOSIS — I10 Essential (primary) hypertension: Secondary | ICD-10-CM | POA: Diagnosis not present

## 2020-06-04 DIAGNOSIS — J029 Acute pharyngitis, unspecified: Secondary | ICD-10-CM | POA: Diagnosis not present

## 2020-06-04 LAB — BASIC METABOLIC PANEL
Anion gap: 11 (ref 5–15)
BUN: 8 mg/dL (ref 8–23)
CO2: 24 mmol/L (ref 22–32)
Calcium: 8 mg/dL — ABNORMAL LOW (ref 8.9–10.3)
Chloride: 95 mmol/L — ABNORMAL LOW (ref 98–111)
Creatinine, Ser: 0.63 mg/dL (ref 0.44–1.00)
GFR calc Af Amer: >60 mL/min (ref >60)
GFR calc non Af Amer: >60 mL/min (ref >60)
Glucose, Bld: 168 mg/dL — ABNORMAL HIGH (ref 70–99)
Potassium: 2.8 mmol/L — ABNORMAL LOW (ref 3.5–5.1)
Sodium: 130 mmol/L — ABNORMAL LOW (ref 135–145)

## 2020-06-04 LAB — PHOSPHORUS: Phosphorus: 1.2 mg/dL — ABNORMAL LOW (ref 2.5–4.6)

## 2020-06-04 LAB — CBC WITH DIFFERENTIAL/PLATELET
Abs Immature Granulocytes: 0.12 10*3/uL — ABNORMAL HIGH (ref 0.00–0.07)
Basophils Absolute: 0 10*3/uL (ref 0.0–0.1)
Basophils Relative: 0 %
Eosinophils Absolute: 0 10*3/uL (ref 0.0–0.5)
Eosinophils Relative: 0 %
HCT: 31.2 % — ABNORMAL LOW (ref 36.0–46.0)
Hemoglobin: 10.4 g/dL — ABNORMAL LOW (ref 12.0–15.0)
Immature Granulocytes: 3 %
Lymphocytes Relative: 15 %
Lymphs Abs: 0.6 10*3/uL — ABNORMAL LOW (ref 0.7–4.0)
MCH: 32.3 pg (ref 26.0–34.0)
MCHC: 33.3 g/dL (ref 30.0–36.0)
MCV: 96.9 fL (ref 80.0–100.0)
Monocytes Absolute: 2 10*3/uL — ABNORMAL HIGH (ref 0.1–1.0)
Monocytes Relative: 47 %
Neutro Abs: 1.5 10*3/uL — ABNORMAL LOW (ref 1.7–7.7)
Neutrophils Relative %: 35 %
Platelets: 229 10*3/uL (ref 150–400)
RBC: 3.22 MIL/uL — ABNORMAL LOW (ref 3.87–5.11)
RDW: 14.2 % (ref 11.5–15.5)
WBC: 4.2 10*3/uL (ref 4.0–10.5)
nRBC: 0.7 % — ABNORMAL HIGH (ref 0.0–0.2)

## 2020-06-04 LAB — GLUCOSE, CAPILLARY
Glucose-Capillary: 171 mg/dL — ABNORMAL HIGH (ref 70–99)
Glucose-Capillary: 163 mg/dL — ABNORMAL HIGH (ref 70–99)
Glucose-Capillary: 151 mg/dL — ABNORMAL HIGH (ref 70–99)
Glucose-Capillary: 166 mg/dL — ABNORMAL HIGH (ref 70–99)

## 2020-06-04 LAB — CMV IGM: CMV IgM: <30 AU/mL (ref 0.0–29.9)

## 2020-06-04 LAB — MAGNESIUM: Magnesium: 2 mg/dL (ref 1.7–2.4)

## 2020-06-04 LAB — HSV(HERPES SIMPLEX VRS) I + II AB-IGM: HSVI/II Comb IgM: 1.08 Ratio — ABNORMAL HIGH (ref 0.00–0.90)

## 2020-06-04 MED ORDER — POTASSIUM CHLORIDE 20 MEQ/15ML (10%) PO SOLN: 40.0000 meq | ORAL | Status: DC

## 2020-06-04 MED ORDER — OXYCODONE HCL 5 MG/5ML PO SOLN
5.0000 mg | ORAL | Status: DC | PRN
  Administered 2020-06-04: 10 mg
  Administered 2020-06-04: 5 mg
  Administered 2020-06-05: 10 mg
  Filled 2020-06-04: qty 10
  Filled 2020-06-04: qty 5
  Filled 2020-06-04: qty 10

## 2020-06-04 MED ORDER — HYDROMORPHONE HCL 1 MG/ML IJ SOLN
0.5000 mg | INTRAMUSCULAR | Status: DC | PRN
  Administered 2020-06-04 – 2020-06-05 (×3): 0.5 mg via INTRAVENOUS
  Filled 2020-06-04 (×2): qty 1

## 2020-06-04 MED ORDER — POTASSIUM CHLORIDE 20 MEQ/15ML (10%) PO SOLN
40.0000 meq | ORAL | Status: AC
  Administered 2020-06-04 (×2): 40 meq
  Filled 2020-06-04 (×2): qty 30

## 2020-06-04 MED ORDER — K PHOS MONO-SOD PHOS DI & MONO 155-852-130 MG PO TABS
500.0000 mg | ORAL_TABLET | Freq: Three times a day (TID) | ORAL | Status: AC
  Administered 2020-06-04 (×3): 500 mg via NASOGASTRIC
  Filled 2020-06-04 (×3): qty 2

## 2020-06-04 NOTE — Progress Notes (Signed)
Results from a laboratory data today reviewed and discussed with the patient's daughter via phone.  Her white cell count continues to increase and currently back within normal range.  Her differential continues to show mild neutropenia and monocytosis.  The differential diagnosis of these findings were reviewed again.  Bone marrow recovery from immunosuppressive agents could be a possibility.  Bone marrow disease such as MDS or monocytic leukemia could also be a possibility.  The only way to fully determine that would be a bone marrow biopsy.  Risks and benefits of this procedure was explained again.  Complications associated with such as pain, bleeding and infection are rare and would offer the most definitive diagnosis of a bone marrow disease.  The plan at this point is to monitor her counts in the next 24 to 48 hours.  If she continues to have abnormal monocytosis associated with neutropenia or other cytopenias, a bone marrow biopsy will be recommended early next week.   This was explained to the patient's daughter Elnoria Howard) in detail and all her questions were answered.

## 2020-06-04 NOTE — Plan of Care (Signed)
  Problem: Education: Goal: Knowledge of General Education information will improve Description: Including pain rating scale, medication(s)/side effects and non-pharmacologic comfort measures Outcome: Progressing   Problem: Education: Goal: Knowledge of General Education information will improve Description: Including pain rating scale, medication(s)/side effects and non-pharmacologic comfort measures Outcome: Progressing   Problem: Education: Goal: Knowledge of General Education information will improve Description: Including pain rating scale, medication(s)/side effects and non-pharmacologic comfort measures Outcome: Progressing   Problem: Education: Goal: Knowledge of General Education information will improve Description: Including pain rating scale, medication(s)/side effects and non-pharmacologic comfort measures Outcome: Progressing   Problem: Education: Goal: Knowledge of General Education information will improve Description: Including pain rating scale, medication(s)/side effects and non-pharmacologic comfort measures Outcome: Progressing   Problem: Education: Goal: Knowledge of General Education information will improve Description: Including pain rating scale, medication(s)/side effects and non-pharmacologic comfort measures Outcome: Progressing   Problem: Education: Goal: Knowledge of General Education information will improve Description: Including pain rating scale, medication(s)/side effects and non-pharmacologic comfort measures Outcome: Progressing

## 2020-06-04 NOTE — Progress Notes (Signed)
PROGRESS NOTE    NANCY ARVIN  LNL:[id number] DOB: [birth year] DOA: 06/25/2020 PCP: [name], MD   Brief Narrative: HAYLEY HORN is a 74 y.o. female history of hypertension, hyperlipidemia, hypothyroidism, anxiety, depression rheumatoid arthritis.  Patient presented secondary to worsening sore throat over the last 2 to 3 weeks and found to have multiple aphthous ulcers with evidence of pharyngitis and supraglottitis with associated upper cervical esophagitis.  ENT consult on admission with no recommendation for airway management at this time.   Assessment & Plan:   Active Problems:   Hypothyroidism   Essential hypertension   Anxiety   Viral pharyngitis   Hypokalemia   Supraglottitis   Esophagitis   Odynophagia   Viral pharyngitis Esophagitis Supraglottitis In setting of leukopenia with neutropenia and lymphocytopenia. ENT consulted and recommendations for supportive care. Blood smear significant for atypical mononuclear cells and blast cells. HSV ab equivocal. CMV negative. -Continue acyclovir -ENT recommendations: supportive care -EBV pending  Leukopenia Neutropenia/Lymphocytopenia Mild. Patient was previously on methotrexate which appears to be the cause. Neutropenia is stable currently. With fever, may need to consider bacterial cause, however, with above findings, likely viral pharyngitis. Will need to consider EBV. Hematology consulted for below and further recommendations appreciated.  Blast cells Concerning. -Oncology recommendations: watch CBC/differential  Odynophagia Secondary to above. Managed with IV pain medication. Unable to eat secondary to pain. Patient with visible thrush; possibly an element of esophageal esophagitis. NG tube placed on 8/6 and tube feeds initiated -Continue Dilaudid prn; will add oxycodone prn via tube -Treat below as well  Oral thrush Possible esophageal esophagitis Appears to be new. Possibly associated esophageal  candidiasis with odynophagia -Fluconazole IV  Tube feeds -Continue tube feeds. Goal rate of 60 ml/hr per dietitian recommendations -Potassium, magnesium, phosphorus daily; replete as needed  Hyponatremia Secondary to poor fluid intake. Improved with IV fluids initially and slightly down again -Nutrition as above  Ground-glass nodule Noted incidentally on CT scan.  Nodule described as a 6 mm groundglass nodule in the left lung apex.  Recommendation for follow-up CT at 6 to 12 months.  Patient will need outpatient follow-up.  Hypothyroidism Patient is on Synthroid 112 mcg daily -Continue Synthroid once NG tube place  Essential hypertension Patient is on amlodipine, hydrochlorothiazide and irbesartan as an outpatient. BP mostly controlled -Hydralazine prn  GERD Patient is on Pepcid and Protonix as an outpatient. Currently unable to swallow well secondary to above -Pepcid IV daily  Rheumatoid arthritis Patient was previously on methotrexate and is now on alirocumab as an outpatient.   DVT prophylaxis: Lovenox Code Status:   Code Status: Full Code Family Communication: Daughter on telephone Disposition Plan: Discharge home likely in several days pending improved ability to swallow and take in PO   Consultants:   ENT  Oncology  Procedures:   None  Antimicrobials:  Acyclovir IV    Subjective: Some throat pain. Better than before but still present.  Objective: Vitals:   06/03/20 1456 06/03/20 1949 06/04/20 0347 06/04/20 0500  BP: (!) 160/90 (!) 141/92 (!) 149/83   Pulse: 95 (!) 108 (!) 110   Resp: 16 20 18    Temp: 98.8 F (37.1 C) 99.8 F (37.7 C) 99.9 F (37.7 C)   TempSrc:  Oral Oral   SpO2: 95% 94% 94%   Weight:    74 kg  Height:        Intake/Output Summary (Last 24 hours) at 06/04/2020 0758 Last data filed at 06/04/2020 0558 Gross per 24 hour  Intake 3305.58 ml  Output 850 ml  Net 2455.58 ml   Filed Weights   06/17/2020 0834 06/03/20 0500 06/04/20  0500  Weight: 69 kg 73.9 kg 74 kg    Examination:  General exam: Appears calm and comfortable HEENT: Multiple oral ulcers, oral candidiasis. No erythema. Respiratory system: Clear to auscultation. Respiratory effort normal. Cardiovascular system: S1 & S2 heard, RRR. No murmurs, rubs, gallops or clicks. Gastrointestinal system: Abdomen is nondistended, soft and nontender. No organomegaly or masses felt. Normal bowel sounds heard. Central nervous system: Alert and oriented. No focal neurological deficits. Musculoskeletal: No edema. No calf tenderness Skin: No cyanosis. No rashes Psychiatry: Judgement and insight appear normal. Mood & affect appropriate.     Data Reviewed: I have personally reviewed following labs and imaging studies  CBC Lab Results  Component Value Date   WBC 4.2 06/04/2020   RBC 3.22 (L) 06/04/2020   HGB 10.4 (L) 06/04/2020   HCT 31.2 (L) 06/04/2020   MCV 96.9 06/04/2020   MCH 32.3 06/04/2020   PLT 229 06/04/2020   MCHC 33.3 06/04/2020   RDW 14.2 06/04/2020   LYMPHSABS 0.6 (L) 06/04/2020   MONOABS 2.0 (H) 06/04/2020   EOSABS 0.0 06/04/2020   BASOSABS 0.0 79/89/2119     Last metabolic panel Lab Results  Component Value Date   NA 130 (L) 06/04/2020   K 2.8 (L) 06/04/2020   CL 95 (L) 06/04/2020   CO2 24 06/04/2020   BUN 8 06/04/2020   CREATININE 0.63 06/04/2020   GLUCOSE 168 (H) 06/04/2020   GFRNONAA >60 06/04/2020   GFRAA >60 06/04/2020   CALCIUM 8.0 (L) 06/04/2020   PHOS 1.2 (L) 06/04/2020   PROT 6.6 06/11/2020   ALBUMIN 3.0 (L) 06/16/2020   BILITOT 1.4 (H) 06/11/2020   ALKPHOS 59 06/23/2020   AST 27 06/28/2020   ALT 20 06/25/2020   ANIONGAP 11 06/04/2020    CBG (last 3)  Recent Labs    06/03/20 2056 06/03/20 2322 06/04/20 0351  GLUCAP 147* 156* 171*     GFR: Estimated Creatinine Clearance: 53.7 mL/min (by C-G formula based on SCr of 0.63 mg/dL).  Coagulation Profile: No results for input(s): INR, PROTIME in the last 168  hours.  Recent Results (from the past 240 hour(s))  SARS Coronavirus 2 by RT PCR (hospital order, performed in [facility] hospital lab) Nasopharyngeal Nasopharyngeal Swab     Status: None   Collection Time: 06/20/2020 12:06 PM   Specimen: Nasopharyngeal Swab  Result Value Ref Range Status   SARS Coronavirus 2 NEGATIVE NEGATIVE Final    Comment: (NOTE) SARS-CoV-2 target nucleic acids are NOT DETECTED.  The SARS-CoV-2 RNA is generally detectable in upper and lower respiratory specimens during the acute phase of infection. The lowest concentration of SARS-CoV-2 viral copies this assay can detect is 250 copies / mL. A negative result does not preclude SARS-CoV-2 infection and should not be used as the sole basis for treatment or other patient management decisions.  A negative result may occur with improper specimen collection / handling, submission of specimen other than nasopharyngeal swab, presence of viral mutation(s) within the areas targeted by this assay, and inadequate number of viral copies (<250 copies / mL). A negative result must be combined with clinical observations, patient history, and epidemiological information.  Fact Sheet for Patients:   StrictlyIdeas.no  Fact Sheet for Healthcare Providers: BankingDealers.co.za  This test is not yet approved or  cleared by the [place] FDA and has been authorized for detection  and/or diagnosis of SARS-CoV-2 by FDA under an Emergency Use Authorization (EUA).  This EUA will remain in effect (meaning this test can be used) for the duration of the COVID-19 declaration under Section 564(b)(1) of the Act, 21 U.S.C. section 360bbb-3(b)(1), unless the authorization is terminated or revoked sooner.  Performed at [facility], [place] [address], [place], [place] [address]   Group A Strep by PCR     Status: None   Collection Time: 06/18/2020 12:06 PM  Result Value Ref Range Status    Group A Strep by PCR NOT DETECTED NOT DETECTED Final    Comment: Performed at [facility], [place] [address] [place], [place] [address]  Blood culture (routine x 2)     Status: None (Preliminary result)   Collection Time: 06/16/2020 12:33 PM   Specimen: BLOOD  Result Value Ref Range Status   Specimen Description BLOOD RIGHT ANTECUBITAL  Final   Special Requests   Final    BOTTLES DRAWN AEROBIC AND ANAEROBIC Blood Culture adequate volume   Culture   Final    NO GROWTH 3 DAYS Performed at [facility], [place] [address] [place], [place] [address]    Report Status PENDING  Incomplete        Radiology Studies: No results found.      Scheduled Meds: . enoxaparin (LOVENOX) injection  40 mg Subcutaneous Q24H  . free water  120 mL Per Tube Q6H  . pantoprazole (PROTONIX) IV  40 mg Intravenous Daily  . phosphorus  500 mg Per NG tube TID  . potassium chloride  40 mEq Per Tube Q4H  . sodium chloride flush  3 mL Intravenous Q12H   Continuous Infusions: . acyclovir 550 mg (06/04/20 0502)  . famotidine (PEPCID) IV 20 mg (06/03/20 2300)  . feeding supplement (OSMOLITE 1.2 CAL) 1,000 mL (06/03/20 1253)  . fluconazole (DIFLUCAN) IV       LOS: 4 days     Cordelia Poche, MD [facility] 06/04/2020, 7:58 AM  If 7PM-7AM, please contact night-coverage [email or web address]

## 2020-06-04 NOTE — Plan of Care (Signed)

## 2020-06-04 NOTE — Progress Notes (Addendum)
Reported increase throat pain at the beginning of the shift, PRN IV pain med given with relief, patient noted to have increase anxiety & restlessness as well, writer adminstered PRN anti anxiety med (IV) which was effective. HOB high fowler's to promote better airway a and pt has difficulty swallowing d/t pain. Thickened mucus/sputum noted during oral suctioning, continued to educate pt regarding oral suctioning. Oral care provided by Probation officer. At around 0100 On call provider was paged d/t sinus tachycardia  (109-120's), denies chest pain/headache/dizzines, and requested heart rate parameters for PRN IV BP meds. Order received for an updated parameters. Writer administered IV metroprolol 2.5mg  d/t sustained HR of >120's which  Had good results and was effective. Continue monitor for changes.

## 2020-06-05 ENCOUNTER — Inpatient Hospital Stay (HOSPITAL_COMMUNITY): Payer: Medicare Other

## 2020-06-05 DIAGNOSIS — E876 Hypokalemia: Secondary | ICD-10-CM | POA: Diagnosis not present

## 2020-06-05 DIAGNOSIS — J029 Acute pharyngitis, unspecified: Secondary | ICD-10-CM | POA: Diagnosis not present

## 2020-06-05 DIAGNOSIS — K209 Esophagitis, unspecified without bleeding: Secondary | ICD-10-CM | POA: Diagnosis not present

## 2020-06-05 DIAGNOSIS — I10 Essential (primary) hypertension: Secondary | ICD-10-CM | POA: Diagnosis not present

## 2020-06-05 LAB — BASIC METABOLIC PANEL
Anion gap: 10 (ref 5–15)
BUN: 15 mg/dL (ref 8–23)
CO2: 26 mmol/L (ref 22–32)
Calcium: 7.5 mg/dL — ABNORMAL LOW (ref 8.9–10.3)
Chloride: 97 mmol/L — ABNORMAL LOW (ref 98–111)
Creatinine, Ser: 1.1 mg/dL — ABNORMAL HIGH (ref 0.44–1.00)
GFR calc Af Amer: 56 mL/min — ABNORMAL LOW (ref >60)
GFR calc non Af Amer: 49 mL/min — ABNORMAL LOW (ref >60)
Glucose, Bld: 153 mg/dL — ABNORMAL HIGH (ref 70–99)
Potassium: 3.7 mmol/L (ref 3.5–5.1)
Sodium: 133 mmol/L — ABNORMAL LOW (ref 135–145)

## 2020-06-05 LAB — CBC WITH DIFFERENTIAL/PLATELET
Abs Immature Granulocytes: 0.09 10*3/uL — ABNORMAL HIGH (ref 0.00–0.07)
Basophils Absolute: 0 10*3/uL (ref 0.0–0.1)
Basophils Relative: 0 %
Eosinophils Absolute: 0 10*3/uL (ref 0.0–0.5)
Eosinophils Relative: 0 %
HCT: 32.2 % — ABNORMAL LOW (ref 36.0–46.0)
Hemoglobin: 10.6 g/dL — ABNORMAL LOW (ref 12.0–15.0)
Immature Granulocytes: 2 %
Lymphocytes Relative: 10 %
Lymphs Abs: 0.5 10*3/uL — ABNORMAL LOW (ref 0.7–4.0)
MCH: 32.6 pg (ref 26.0–34.0)
MCHC: 32.9 g/dL (ref 30.0–36.0)
MCV: 99.1 fL (ref 80.0–100.0)
Monocytes Absolute: 3.1 10*3/uL — ABNORMAL HIGH (ref 0.1–1.0)
Monocytes Relative: 56 %
Neutro Abs: 1.8 10*3/uL (ref 1.7–7.7)
Neutrophils Relative %: 32 %
Platelets: 198 10*3/uL (ref 150–400)
RBC: 3.25 MIL/uL — ABNORMAL LOW (ref 3.87–5.11)
RDW: 14.7 % (ref 11.5–15.5)
WBC: 5.5 10*3/uL (ref 4.0–10.5)
nRBC: 0.5 % — ABNORMAL HIGH (ref 0.0–0.2)

## 2020-06-05 LAB — MAGNESIUM: Magnesium: 1.9 mg/dL (ref 1.7–2.4)

## 2020-06-05 LAB — GLUCOSE, CAPILLARY
Glucose-Capillary: 147 mg/dL — ABNORMAL HIGH (ref 70–99)
Glucose-Capillary: 151 mg/dL — ABNORMAL HIGH (ref 70–99)
Glucose-Capillary: 164 mg/dL — ABNORMAL HIGH (ref 70–99)
Glucose-Capillary: 164 mg/dL — ABNORMAL HIGH (ref 70–99)
Glucose-Capillary: 174 mg/dL — ABNORMAL HIGH (ref 70–99)
Glucose-Capillary: 195 mg/dL — ABNORMAL HIGH (ref 70–99)
Glucose-Capillary: 203 mg/dL — ABNORMAL HIGH (ref 70–99)

## 2020-06-05 LAB — CULTURE, BLOOD (ROUTINE X 2)
Culture: NO GROWTH
Special Requests: ADEQUATE

## 2020-06-05 LAB — EPSTEIN-BARR VIRUS VCA, IGM: EBV VCA IgM: <36 U/mL (ref 0.0–35.9)

## 2020-06-05 LAB — PHOSPHORUS: Phosphorus: 2.2 mg/dL — ABNORMAL LOW (ref 2.5–4.6)

## 2020-06-05 LAB — EPSTEIN-BARR VIRUS VCA, IGG: EBV VCA IgG: >600 U/mL — ABNORMAL HIGH (ref 0.0–17.9)

## 2020-06-05 MED ORDER — ROSUVASTATIN CALCIUM 5 MG PO TABS
10.0000 mg | ORAL_TABLET | Freq: Every day | ORAL | Status: DC
  Administered 2020-06-05 – 2020-06-12 (×8): 10 mg
  Filled 2020-06-05 (×8): qty 2

## 2020-06-05 MED ORDER — CARVEDILOL 3.125 MG PO TABS
3.1250 mg | ORAL_TABLET | Freq: Two times a day (BID) | ORAL | Status: DC
  Administered 2020-06-05 – 2020-06-06 (×3): 3.125 mg via NASOGASTRIC
  Filled 2020-06-05 (×4): qty 1

## 2020-06-05 MED ORDER — SODIUM CHLORIDE 0.9 % IV SOLN: INTRAVENOUS | Status: DC

## 2020-06-05 MED ORDER — FLUCONAZOLE 100MG IVPB
100.0000 mg | INTRAVENOUS | Status: DC
  Administered 2020-06-05 – 2020-06-12 (×8): 100 mg via INTRAVENOUS
  Filled 2020-06-05 (×8): qty 50

## 2020-06-05 MED ORDER — DEXTROSE 5 % IV SOLN
550.0000 mg | Freq: Two times a day (BID) | INTRAVENOUS | Status: DC
  Administered 2020-06-05 – 2020-06-12 (×14): 550 mg via INTRAVENOUS
  Filled 2020-06-05 (×17): qty 11

## 2020-06-05 MED ORDER — OXYCODONE HCL 5 MG/5ML PO SOLN
5.0000 mg | ORAL | Status: DC | PRN
  Administered 2020-06-05: 5 mg
  Filled 2020-06-05: qty 5

## 2020-06-05 MED ORDER — LEVOTHYROXINE SODIUM 112 MCG PO TABS
112.0000 ug | ORAL_TABLET | Freq: Every day | ORAL | Status: DC
  Administered 2020-06-05 – 2020-06-12 (×8): 112 ug via NASOGASTRIC
  Filled 2020-06-05 (×9): qty 1

## 2020-06-05 MED ORDER — FAMOTIDINE 20 MG PO TABS
20.0000 mg | ORAL_TABLET | Freq: Every day | ORAL | Status: DC
  Administered 2020-06-05 – 2020-06-12 (×8): 20 mg
  Filled 2020-06-05 (×8): qty 1

## 2020-06-05 NOTE — Plan of Care (Signed)

## 2020-06-05 NOTE — Progress Notes (Signed)
   06/05/20 0836  Assess: MEWS Score  Temp 98.5 F (36.9 C)  BP (!) 142/77  Pulse Rate (!) 123  Resp 18  Level of Consciousness Alert  SpO2 98 %  O2 Device Room Air  Assess: MEWS Score  MEWS Temp 0  MEWS Systolic 0  MEWS Pulse 2  MEWS RR 0  MEWS LOC 0  MEWS Score 2  MEWS Score Color Yellow  Assess: if the MEWS score is Yellow or Red  Were vital signs taken at a resting state? Yes  Focused Assessment Change from prior assessment (see assessment flowsheet)  Early Detection of Sepsis Score *See Row Information* Low  MEWS guidelines implemented *See Row Information* Yes  Treat  MEWS Interventions Administered scheduled meds/treatments;Escalated (See documentation below)  Pain Scale 0-10  Pain Score 9  Pain Type Acute pain  Pain Location Mouth  Pain Intervention(s) Medication (See eMAR)  Breathing 0  Negative Vocalization 0  Facial Expression 1  Take Vital Signs  Increase Vital Sign Frequency  Yellow: Q 2hr X 2 then Q 4hr X 2, if remains yellow, continue Q 4hrs  Escalate  MEWS: Escalate Yellow: discuss with charge nurse/RN and consider discussing with provider and RRT  Notify: Charge Nurse/RN  Name of Charge Nurse/RN Notified Roj  Date Charge Nurse/RN Notified 06/05/20  Time Charge Nurse/RN Notified 1610  Notify: Provider  Provider Name/Title Dr. Teryl Lucy  Date Provider Notified 06/05/20  Time Provider Notified [phone]  Notification Type Face-to-face  Notification Reason Change in status  Response See new orders  Date of Provider Response 06/05/20  Time of Provider Response 0834  Notify: Rapid Response  Name of Rapid Response RN Notified  (n/a)  Date Rapid Response Notified  (n/a)  Time Rapid Response Notified  (n/a)  Document  Patient Outcome Stabilized after interventions  Progress note created (see row info) Yes  patient tachycardia while lying in bed. Complain of pain. Pain med given. Too early for metoprol prn. Notified MD. EKG  And beta blocker given per order.   MD in to see patient and new order noted. Will continue to monitor.

## 2020-06-05 NOTE — Progress Notes (Signed)
PROGRESS NOTE    Katherine Wang  VZD:[id number] DOB: 1946/10/22 DOA: 05/30/2020 PCP: [name], MD   Brief Narrative: Katherine Wang is a 74 y.o. female history of hypertension, hyperlipidemia, hypothyroidism, anxiety, depression rheumatoid arthritis.  Patient presented secondary to worsening sore throat over the last 2 to 3 weeks and found to have multiple aphthous ulcers with evidence of pharyngitis and supraglottitis with associated upper cervical esophagitis.  ENT consult on admission with no recommendation for airway management at this time.   Assessment & Plan:   Active Problems:   Hypothyroidism   Essential hypertension   Anxiety   Viral pharyngitis   Hypokalemia   Supraglottitis   Esophagitis   Odynophagia   Viral pharyngitis Esophagitis Supraglottitis Mucositis  In setting of leukopenia with neutropenia and lymphocytopenia. ENT consulted and recommendations for supportive care. Blood smear significant for atypical mononuclear cells and blast cells. HSV ab equivocal. CMV negative. -Continue acyclovir -ENT recommendations: supportive care -EBV pending  Leukopenia Neutropenia/Lymphocytopenia Mild. Patient was previously on methotrexate which appears to be the cause. Neutropenia is stable currently. With fever, may need to consider bacterial cause, however, with above findings, likely viral pharyngitis. Fevers appear to be improved. Will need to consider EBV. Hematology consulted for below and further recommendations appreciated.  Blast cells Concerning. -Oncology recommendations: watch CBC/differential. Possible bone marrow biopsy  Odynophagia Secondary to above. Managed with IV pain medication. Unable to eat secondary to pain. Patient with visible thrush; possibly an element of esophageal esophagitis. NG tube placed on 8/6 and tube feeds initiated. Patient appears more lethargic today. -Decrease to Oxycodone 61m prn per tube; dilaudid prn -Treat below as  well  Oral thrush Possible esophageal esophagitis Appears to be new. Possibly associated esophageal candidiasis with odynophagia -Fluconazole IV  Tube feeds -Continue tube feeds. Goal rate of 60 ml/hr per dietitian recommendations -Potassium, magnesium, phosphorus daily; replete as needed  Tachycardia Possibly secondary to not receiving home Coreg. Patient also with rales but no increased work of breathing. Possible response to pain. BP normal. EKG obtained and significant for sinus tachycardia -Coreg -Watch after IV fluids  AKI Baseline creatinine of 0.7. Up to 1.10 today in setting of discontinuing IV fluids after tube feeds initiated. -Restart IV fluids  Hyponatremia Secondary to poor fluid intake. Improved with IV fluids initially and slightly down again -Nutrition as above  Rales -Chest x-ray  Ground-glass nodule Noted incidentally on CT scan.  Nodule described as a 6 mm groundglass nodule in the left lung apex.  Recommendation for follow-up CT at 6 to 12 months.  Patient will need outpatient follow-up.  Hypothyroidism Patient is on Synthroid 112 mcg daily -Continue Synthroid once NG tube place  Essential hypertension Patient is on amlodipine, hydrochlorothiazide and irbesartan as an outpatient. BP mostly controlled -Hydralazine prn  GERD Patient is on Pepcid and Protonix as an outpatient. Currently unable to swallow well secondary to above -Pepcid IV daily  Rheumatoid arthritis Patient was previously on methotrexate and is now on alirocumab as an outpatient.   DVT prophylaxis: Lovenox Code Status:   Code Status: Full Code Family Communication: Daughter at bedside Disposition Plan: Discharge home likely in several days pending improved ability to swallow and take in PO   Consultants:   ENT  Oncology  Procedures:   None  Antimicrobials:  Acyclovir IV    Subjective: Continued throat pain  Objective: Vitals:   06/04/20 2125 06/05/20 0354  06/05/20 0836 06/05/20 0930  BP: (!) 137/91 (!) 152/80 (!) 142/77 134/76  Pulse: 100 100 (!) 123 (!) 124  Resp: 16 16 18 17   Temp: 97.8 F (36.6 C) 98.1 F (36.7 C) 98.5 F (36.9 C) (!) 97.2 F (36.2 C)  TempSrc: Oral Oral Oral Oral  SpO2: 99% 98%    Weight:      Height:        Intake/Output Summary (Last 24 hours) at 06/05/2020 1028 Last data filed at 06/04/2020 2017 Gross per 24 hour  Intake --  Output 250 ml  Net -250 ml   Filed Weights   06/24/2020 0834 06/03/20 0500 06/04/20 0500  Weight: 69 kg 73.9 kg 74 kg    Examination:  General exam: Appears calm and comfortable Respiratory system: Diffuse rales. Respiratory effort normal. Cardiovascular system: S1 & S2 heard, Tachycardia, normal rhythm. No murmurs, rubs, gallops or clicks. Gastrointestinal system: Abdomen is nondistended, soft and nontender. No organomegaly or masses felt. Normal bowel sounds heard. Central nervous system: Lethargic but arouses somewhat easily. Follows commands. Musculoskeletal: No edema. No calf tenderness Skin: No cyanosis. No rashes Psychiatry: Judgement and insight appear normal. Mood & affect appropriate.      Data Reviewed: I have personally reviewed following labs and imaging studies  CBC Lab Results  Component Value Date   WBC 5.5 06/05/2020   RBC 3.25 (L) 06/05/2020   HGB 10.6 (L) 06/05/2020   HCT 32.2 (L) 06/05/2020   MCV 99.1 06/05/2020   MCH 32.6 06/05/2020   PLT 198 06/05/2020   MCHC 32.9 06/05/2020   RDW 14.7 06/05/2020   LYMPHSABS 0.5 (L) 06/05/2020   MONOABS 3.1 (H) 06/05/2020   EOSABS 0.0 06/05/2020   BASOSABS 0.0 36/09/2448     Last metabolic panel Lab Results  Component Value Date   NA 133 (L) 06/05/2020   K 3.7 06/05/2020   CL 97 (L) 06/05/2020   CO2 26 06/05/2020   BUN 15 06/05/2020   CREATININE 1.10 (H) 06/05/2020   GLUCOSE 153 (H) 06/05/2020   GFRNONAA 49 (L) 06/05/2020   GFRAA 56 (L) 06/05/2020   CALCIUM 7.5 (L) 06/05/2020   PHOS 2.2 (L) 06/05/2020    PROT 6.6 06/19/2020   ALBUMIN 3.0 (L) 06/03/2020   BILITOT 1.4 (H) 06/27/2020   ALKPHOS 59 06/27/2020   AST 27 06/07/2020   ALT 20 06/19/2020   ANIONGAP 10 06/05/2020    CBG (last 3)  Recent Labs    06/05/20 0005 06/05/20 0353 06/05/20 0812  GLUCAP 147* 164* 203*     GFR: Estimated Creatinine Clearance: 39.1 mL/min (A) (by C-G formula based on SCr of 1.1 mg/dL (H)).  Coagulation Profile: No results for input(s): INR, PROTIME in the last 168 hours.  Recent Results (from the past 240 hour(s))  SARS Coronavirus 2 by RT PCR (hospital order, performed in [facility] hospital lab) Nasopharyngeal Nasopharyngeal Swab     Status: None   Collection Time: 06/15/2020 12:06 PM   Specimen: Nasopharyngeal Swab  Result Value Ref Range Status   SARS Coronavirus 2 NEGATIVE NEGATIVE Final    Comment: (NOTE) SARS-CoV-2 target nucleic acids are NOT DETECTED.  The SARS-CoV-2 RNA is generally detectable in upper and lower respiratory specimens during the acute phase of infection. The lowest concentration of SARS-CoV-2 viral copies this assay can detect is 250 copies / mL. A negative result does not preclude SARS-CoV-2 infection and should not be used as the sole basis for treatment or other patient management decisions.  A negative result may occur with improper specimen collection / handling, submission of specimen other  than nasopharyngeal swab, presence of viral mutation(s) within the areas targeted by this assay, and inadequate number of viral copies (<250 copies / mL). A negative result must be combined with clinical observations, patient history, and epidemiological information.  Fact Sheet for Patients:   StrictlyIdeas.no  Fact Sheet for Healthcare Providers: BankingDealers.co.za  This test is not yet approved or  cleared by the [place] FDA and has been authorized for detection and/or diagnosis of SARS-CoV-2 by FDA under an  Emergency Use Authorization (EUA).  This EUA will remain in effect (meaning this test can be used) for the duration of the COVID-19 declaration under Section 564(b)(1) of the Act, 21 U.S.C. section 360bbb-3(b)(1), unless the authorization is terminated or revoked sooner.  Performed at [facility], [place] [address], [place], [place] [address]   Group A Strep by PCR     Status: None   Collection Time: 06/15/2020 12:06 PM  Result Value Ref Range Status   Group A Strep by PCR NOT DETECTED NOT DETECTED Final    Comment: Performed at [facility], [place] [address], [place], [place] [address]  Blood culture (routine x 2)     Status: None   Collection Time: 06/21/2020 12:33 PM   Specimen: BLOOD  Result Value Ref Range Status   Specimen Description BLOOD RIGHT ANTECUBITAL  Final   Special Requests   Final    BOTTLES DRAWN AEROBIC AND ANAEROBIC Blood Culture adequate volume   Culture   Final    NO GROWTH 5 DAYS Performed at [facility], [place] [address], [place], [place] [address]    Report Status 06/05/2020 FINAL  Final        Radiology Studies: No results found.      Scheduled Meds:  carvedilol  3.125 mg Per NG tube BID WC   enoxaparin (LOVENOX) injection  40 mg Subcutaneous Q24H   famotidine  20 mg Per Tube Daily   free water  120 mL Per Tube Q6H   levothyroxine  112 mcg Per NG tube QAC breakfast   rosuvastatin  10 mg Per Tube Daily   sodium chloride flush  3 mL Intravenous Q12H   Continuous Infusions:  acyclovir     feeding supplement (OSMOLITE 1.2 CAL) 1,000 mL (06/03/20 1253)   fluconazole (DIFLUCAN) IV 200 mg (06/04/20 1144)     LOS: 5 days     Cordelia Poche, MD [facility] 06/05/2020, 10:28 AM  If 7PM-7AM, please contact night-coverage [email or web address]

## 2020-06-05 NOTE — Plan of Care (Signed)

## 2020-06-05 NOTE — Progress Notes (Signed)
Pharmacy Antibiotic Note  Katherine Wang is a 74 y.o. female with history of rheumatoid arthritis on Cimzia (started 05/03/20) admitted on 06/28/2020 with sore throat and found to have viral pharyngitis, esophagitis, supraglottitis. Pharmacy has been consulted for acyclovir and fluconazole dosing.  Today, Scr increased from 0.6 mg/dL to 1.1 mg/dL. Per [facility] dosing guidelines, CrCl 39 ml/min qualifies for dose adjustment to every 12 hours. Patient still reporting mouth/throat pain/fatigue; however now afebrile with recovering WBC. Due to visible thrush and mouth ulcers, patient initiated on fluconazole for esophageal candidiasis. Dosing recommendations are LD 400mg  on day 1 followed by 200mg  daily. For patients with CrCl < 50 ml/min, a 50% dosing reduction is recommended. Spoke with primary MD who agreed with pharmacy consult for fluconazole dose adjustment as needed pending renal function. Patient is on IV therapy and had recent Cortrak placement on 8/6 due to pain when swallowing. Consider switch to oral fluconazole as appropriate.  Plan: Acyclovir IV 550 mg IV (10mg /kg AdjBW) q12h Fluconazole 100mg  once daily (50% dose reduction per CrCl < 50 ml/min) F/U C&S, abx de-escalation, LOT, renal function    Height: 5' (152.4 cm) Weight: 74 kg (163 lb 2.3 oz) IBW/kg (Calculated) : 45.5  Temp (24hrs), Avg:97.8 F (36.6 C), Min:97.2 F (36.2 C), Max:98.5 F (36.9 C)  Recent Labs  Lab 06/16/2020 1225 06/09/2020 1230 06/10/2020 1230 06/01/20 0525 06/02/20 0324 06/03/20 0822 06/04/20 0130 06/05/20 0253  WBC  --  2.4*   < > 1.9*  1.9* 2.9* 2.6* 4.2 5.5  CREATININE  --  0.80  --  0.70  --  0.69 0.63 1.10*  LATICACIDVEN 1.1  --   --   --   --   --   --   --    < > = values in this interval not displayed.    Estimated Creatinine Clearance: 39.1 mL/min (A) (by C-G formula based on SCr of 1.1 mg/dL (H)).    Allergies  Allergen Reactions  . Tramadol Other (See Comments)    dizzy  . Statins     Body  ache    Antimicrobials this admission: 8/3 acyclovir >> 8/6 fluconazole >>   Microbiology results: 8/3 HSV culture & typing 8/3 BCs: ngtd 8/3 Group A strep by PCR - not detected  Alfonse Spruce, PharmD PGY2 ID Pharmacy Resident Phone between 7 am - 3:30 pm: [phone]  Please check AMION for all [place] phone numbers After 10:00 PM, call [place] [phone]   06/05/2020   10:12 AM

## 2020-06-06 ENCOUNTER — Inpatient Hospital Stay (HOSPITAL_COMMUNITY): Payer: Medicare Other

## 2020-06-06 DIAGNOSIS — I9589 Other hypotension: Secondary | ICD-10-CM

## 2020-06-06 DIAGNOSIS — J9601 Acute respiratory failure with hypoxia: Secondary | ICD-10-CM

## 2020-06-06 DIAGNOSIS — E876 Hypokalemia: Secondary | ICD-10-CM | POA: Diagnosis not present

## 2020-06-06 DIAGNOSIS — J029 Acute pharyngitis, unspecified: Secondary | ICD-10-CM | POA: Diagnosis not present

## 2020-06-06 DIAGNOSIS — I10 Essential (primary) hypertension: Secondary | ICD-10-CM | POA: Diagnosis not present

## 2020-06-06 DIAGNOSIS — E861 Hypovolemia: Secondary | ICD-10-CM

## 2020-06-06 DIAGNOSIS — K209 Esophagitis, unspecified without bleeding: Secondary | ICD-10-CM | POA: Diagnosis not present

## 2020-06-06 DIAGNOSIS — J690 Pneumonitis due to inhalation of food and vomit: Secondary | ICD-10-CM

## 2020-06-06 DIAGNOSIS — R0603 Acute respiratory distress: Secondary | ICD-10-CM

## 2020-06-06 LAB — CBC WITH DIFFERENTIAL/PLATELET
Abs Immature Granulocytes: 0.2 10*3/uL — ABNORMAL HIGH (ref 0.00–0.07)
Basophils Absolute: 0 10*3/uL (ref 0.0–0.1)
Basophils Relative: 0 %
Eosinophils Absolute: 0 10*3/uL (ref 0.0–0.5)
Eosinophils Relative: 0 %
HCT: 30.7 % — ABNORMAL LOW (ref 36.0–46.0)
Hemoglobin: 9.8 g/dL — ABNORMAL LOW (ref 12.0–15.0)
Immature Granulocytes: 3 %
Lymphocytes Relative: 11 %
Lymphs Abs: 0.7 10*3/uL (ref 0.7–4.0)
MCH: 32.1 pg (ref 26.0–34.0)
MCHC: 31.9 g/dL (ref 30.0–36.0)
MCV: 100.7 fL — ABNORMAL HIGH (ref 80.0–100.0)
Monocytes Absolute: 3.7 10*3/uL — ABNORMAL HIGH (ref 0.1–1.0)
Monocytes Relative: 58 %
Neutro Abs: 1.8 10*3/uL (ref 1.7–7.7)
Neutrophils Relative %: 28 %
Platelets: 197 10*3/uL (ref 150–400)
RBC: 3.05 MIL/uL — ABNORMAL LOW (ref 3.87–5.11)
RDW: 15.1 % (ref 11.5–15.5)
WBC: 6.4 10*3/uL (ref 4.0–10.5)
nRBC: 0.5 % — ABNORMAL HIGH (ref 0.0–0.2)

## 2020-06-06 LAB — POCT I-STAT 7, (LYTES, BLD GAS, ICA,H+H)
Acid-Base Excess: 1 mmol/L (ref 0.0–2.0)
Bicarbonate: 24.2 mmol/L (ref 20.0–28.0)
Calcium, Ion: 0.94 mmol/L — ABNORMAL LOW (ref 1.15–1.40)
HCT: 24 % — ABNORMAL LOW (ref 36.0–46.0)
Hemoglobin: 8.2 g/dL — ABNORMAL LOW (ref 12.0–15.0)
O2 Saturation: 100 %
Potassium: 4.4 mmol/L (ref 3.5–5.1)
Sodium: 133 mmol/L — ABNORMAL LOW (ref 135–145)
TCO2: 25 mmol/L (ref 22–32)
pCO2 arterial: 31.2 mmHg — ABNORMAL LOW (ref 32.0–48.0)
pH, Arterial: 7.497 — ABNORMAL HIGH (ref 7.350–7.450)
pO2, Arterial: 369 mmHg — ABNORMAL HIGH (ref 83.0–108.0)

## 2020-06-06 LAB — LACTIC ACID, PLASMA
Lactic Acid, Venous: 1.6 mmol/L (ref 0.5–1.9)
Lactic Acid, Venous: 1.4 mmol/L (ref 0.5–1.9)

## 2020-06-06 LAB — BASIC METABOLIC PANEL
Anion gap: 10 (ref 5–15)
BUN: 18 mg/dL (ref 8–23)
CO2: 24 mmol/L (ref 22–32)
Calcium: 7 mg/dL — ABNORMAL LOW (ref 8.9–10.3)
Chloride: 98 mmol/L (ref 98–111)
Creatinine, Ser: 1.03 mg/dL — ABNORMAL HIGH (ref 0.44–1.00)
GFR calc Af Amer: >60 mL/min (ref >60)
GFR calc non Af Amer: 53 mL/min — ABNORMAL LOW (ref >60)
Glucose, Bld: 196 mg/dL — ABNORMAL HIGH (ref 70–99)
Potassium: 4.1 mmol/L (ref 3.5–5.1)
Sodium: 132 mmol/L — ABNORMAL LOW (ref 135–145)

## 2020-06-06 LAB — PROCALCITONIN: Procalcitonin: 0.31 ng/mL

## 2020-06-06 LAB — BLOOD GAS, ARTERIAL
Acid-Base Excess: 0.4 mmol/L (ref 0.0–2.0)
Bicarbonate: 23.8 mmol/L (ref 20.0–28.0)
Drawn by: 330991
FIO2: 28
O2 Saturation: 96.6 %
Patient temperature: 38.6
pCO2 arterial: 36.7 mmHg (ref 32.0–48.0)
pH, Arterial: 7.435 (ref 7.350–7.450)
pO2, Arterial: 91.2 mmHg (ref 83.0–108.0)

## 2020-06-06 LAB — MAGNESIUM: Magnesium: 1.9 mg/dL (ref 1.7–2.4)

## 2020-06-06 LAB — GLUCOSE, CAPILLARY
Glucose-Capillary: 164 mg/dL — ABNORMAL HIGH (ref 70–99)
Glucose-Capillary: 167 mg/dL — ABNORMAL HIGH (ref 70–99)
Glucose-Capillary: 177 mg/dL — ABNORMAL HIGH (ref 70–99)
Glucose-Capillary: 178 mg/dL — ABNORMAL HIGH (ref 70–99)
Glucose-Capillary: 220 mg/dL — ABNORMAL HIGH (ref 70–99)

## 2020-06-06 LAB — VITAMIN D 25 HYDROXY (VIT D DEFICIENCY, FRACTURES): Vit D, 25-Hydroxy: 44.04 ng/mL (ref 30–100)

## 2020-06-06 LAB — PHOSPHORUS: Phosphorus: 1.3 mg/dL — ABNORMAL LOW (ref 2.5–4.6)

## 2020-06-06 LAB — CORTISOL: Cortisol, Plasma: 25.7 ug/dL

## 2020-06-06 MED ORDER — SODIUM CHLORIDE 0.9 % IV SOLN
2.0000 g | Freq: Two times a day (BID) | INTRAVENOUS | Status: DC
  Administered 2020-06-06 – 2020-06-10 (×9): 2 g via INTRAVENOUS
  Filled 2020-06-06 (×10): qty 2

## 2020-06-06 MED ORDER — PROPOFOL 1000 MG/100ML IV EMUL
0.0000 ug/kg/min | INTRAVENOUS | Status: DC
  Administered 2020-06-06: 10 ug/kg/min via INTRAVENOUS
  Administered 2020-06-07: 30 ug/kg/min via INTRAVENOUS
  Administered 2020-06-07: 35 ug/kg/min via INTRAVENOUS
  Filled 2020-06-06 (×2): qty 100

## 2020-06-06 MED ORDER — MIDAZOLAM HCL 2 MG/2ML IJ SOLN
1.0000 mg | INTRAMUSCULAR | Status: DC | PRN
  Administered 2020-06-06 – 2020-06-07 (×3): 1 mg via INTRAVENOUS
  Filled 2020-06-06 (×3): qty 2

## 2020-06-06 MED ORDER — INSULIN ASPART 100 UNIT/ML ~~LOC~~ SOLN
4.0000 [IU] | SUBCUTANEOUS | Status: DC
  Administered 2020-06-06 – 2020-06-11 (×26): 4 [IU] via SUBCUTANEOUS

## 2020-06-06 MED ORDER — ACETAMINOPHEN 325 MG PO TABS
650.0000 mg | ORAL_TABLET | Freq: Four times a day (QID) | ORAL | Status: DC | PRN
  Filled 2020-06-06: qty 2

## 2020-06-06 MED ORDER — LEVALBUTEROL HCL 0.63 MG/3ML IN NEBU
0.6300 mg | INHALATION_SOLUTION | Freq: Three times a day (TID) | RESPIRATORY_TRACT | Status: DC | PRN
  Administered 2020-06-06: 0.63 mg via RESPIRATORY_TRACT
  Filled 2020-06-06: qty 3

## 2020-06-06 MED ORDER — POTASSIUM & SODIUM PHOSPHATES 280-160-250 MG PO PACK
500.0000 mg | PACK | Freq: Three times a day (TID) | ORAL | Status: DC
  Administered 2020-06-06: 500 mg
  Filled 2020-06-06 (×3): qty 2

## 2020-06-06 MED ORDER — LACTATED RINGERS IV SOLN: INTRAVENOUS | Status: DC

## 2020-06-06 MED ORDER — DEXMEDETOMIDINE HCL IN NACL 400 MCG/100ML IV SOLN
0.0000 ug/kg/h | INTRAVENOUS | Status: DC
  Administered 2020-06-06: 0.4 ug/kg/h via INTRAVENOUS
  Filled 2020-06-06 (×2): qty 100

## 2020-06-06 MED ORDER — SODIUM CHLORIDE 0.9% FLUSH
10.0000 mL | INTRAVENOUS | Status: DC | PRN
  Administered 2020-06-12: 10 mL

## 2020-06-06 MED ORDER — FENTANYL CITRATE (PF) 100 MCG/2ML IJ SOLN
INTRAMUSCULAR | Status: AC
  Administered 2020-06-06: 75 ug
  Filled 2020-06-06: qty 2

## 2020-06-06 MED ORDER — SODIUM CHLORIDE 0.9 % IV SOLN: INTRAVENOUS | Status: DC | PRN

## 2020-06-06 MED ORDER — ETOMIDATE 2 MG/ML IV SOLN
10.0000 mg | Freq: Once | INTRAVENOUS | Status: AC
  Administered 2020-06-06: 10 mg via INTRAVENOUS

## 2020-06-06 MED ORDER — METOPROLOL TARTRATE 5 MG/5ML IV SOLN: 2.5000 mg | INTRAVENOUS | Status: DC | PRN

## 2020-06-06 MED ORDER — FENTANYL CITRATE (PF) 100 MCG/2ML IJ SOLN
25.0000 ug | Freq: Once | INTRAMUSCULAR | Status: AC
  Administered 2020-06-06: 25 ug via INTRAVENOUS

## 2020-06-06 MED ORDER — NOREPINEPHRINE 16 MG/250ML-% IV SOLN
0.0000 ug/min | INTRAVENOUS | Status: DC
  Administered 2020-06-06: 15 ug/min via INTRAVENOUS
  Administered 2020-06-08: 6 ug/min via INTRAVENOUS
  Administered 2020-06-11 (×2): 1 ug/min via INTRAVENOUS
  Administered 2020-06-11 – 2020-06-12 (×2): 2 ug/min via INTRAVENOUS
  Filled 2020-06-06 (×4): qty 250

## 2020-06-06 MED ORDER — ACETAMINOPHEN 650 MG RE SUPP: 650.0000 mg | Freq: Four times a day (QID) | RECTAL | Status: DC | PRN

## 2020-06-06 MED ORDER — CHLORHEXIDINE GLUCONATE CLOTH 2 % EX PADS
6.0000 | MEDICATED_PAD | Freq: Every day | CUTANEOUS | Status: DC
  Administered 2020-06-06 – 2020-06-12 (×9): 6 via TOPICAL

## 2020-06-06 MED ORDER — LACTATED RINGERS IV BOLUS
1000.0000 mL | Freq: Once | INTRAVENOUS | Status: AC
  Administered 2020-06-06: 1000 mL via INTRAVENOUS

## 2020-06-06 MED ORDER — VANCOMYCIN HCL 1500 MG/300ML IV SOLN
1500.0000 mg | Freq: Once | INTRAVENOUS | Status: AC
  Administered 2020-06-06: 1500 mg via INTRAVENOUS
  Filled 2020-06-06: qty 300

## 2020-06-06 MED ORDER — NOREPINEPHRINE 4 MG/250ML-% IV SOLN
0.0000 ug/min | INTRAVENOUS | Status: DC
  Administered 2020-06-06: 2 ug/min via INTRAVENOUS
  Administered 2020-06-06: 15 ug/min via INTRAVENOUS
  Filled 2020-06-06 (×2): qty 250

## 2020-06-06 MED ORDER — SODIUM CHLORIDE 0.9 % IV BOLUS
1000.0000 mL | Freq: Once | INTRAVENOUS | Status: AC
  Administered 2020-06-06: 1000 mL via INTRAVENOUS

## 2020-06-06 MED ORDER — ACETAMINOPHEN 160 MG/5ML PO SOLN
650.0000 mg | Freq: Four times a day (QID) | ORAL | Status: DC | PRN
  Administered 2020-06-06 – 2020-06-08 (×3): 650 mg
  Filled 2020-06-06 (×3): qty 20.3

## 2020-06-06 MED ORDER — POLYETHYLENE GLYCOL 3350 17 G PO PACK
17.0000 g | PACK | Freq: Every day | ORAL | Status: DC
  Administered 2020-06-06: 17 g via ORAL
  Filled 2020-06-06 (×2): qty 1

## 2020-06-06 MED ORDER — DOCUSATE SODIUM 50 MG/5ML PO LIQD
100.0000 mg | Freq: Two times a day (BID) | ORAL | Status: DC
  Administered 2020-06-06: 100 mg via ORAL
  Filled 2020-06-06 (×2): qty 10

## 2020-06-06 MED ORDER — MIDAZOLAM HCL 2 MG/2ML IJ SOLN
2.0000 mg | Freq: Once | INTRAMUSCULAR | Status: AC
  Administered 2020-06-06: 2 mg via INTRAVENOUS

## 2020-06-06 MED ORDER — ORAL CARE MOUTH RINSE
15.0000 mL | OROMUCOSAL | Status: DC
  Administered 2020-06-07 – 2020-06-12 (×58): 15 mL via OROMUCOSAL

## 2020-06-06 MED ORDER — ENOXAPARIN SODIUM 40 MG/0.4ML ~~LOC~~ SOLN
40.0000 mg | SUBCUTANEOUS | Status: DC
  Administered 2020-06-08 – 2020-06-09 (×2): 40 mg via SUBCUTANEOUS
  Filled 2020-06-06 (×2): qty 0.4

## 2020-06-06 MED ORDER — ROCURONIUM BROMIDE 50 MG/5ML IV SOLN
50.0000 mg | Freq: Once | INTRAVENOUS | Status: AC
  Administered 2020-06-06: 50 mg via INTRAVENOUS
  Filled 2020-06-06: qty 5

## 2020-06-06 MED ORDER — CHLORHEXIDINE GLUCONATE 0.12% ORAL RINSE (MEDLINE KIT)
15.0000 mL | Freq: Two times a day (BID) | OROMUCOSAL | Status: DC
  Administered 2020-06-06 – 2020-06-12 (×12): 15 mL via OROMUCOSAL

## 2020-06-06 MED ORDER — MIDAZOLAM HCL 2 MG/2ML IJ SOLN
INTRAMUSCULAR | Status: AC
  Administered 2020-06-06: 2 mg
  Filled 2020-06-06: qty 2

## 2020-06-06 MED ORDER — PROPOFOL 1000 MG/100ML IV EMUL
INTRAVENOUS | Status: AC
  Filled 2020-06-06: qty 100

## 2020-06-06 MED ORDER — KETOROLAC TROMETHAMINE 30 MG/ML IJ SOLN
15.0000 mg | Freq: Once | INTRAMUSCULAR | Status: AC
  Administered 2020-06-06: 15 mg via INTRAVENOUS
  Filled 2020-06-06: qty 1

## 2020-06-06 MED ORDER — SODIUM CHLORIDE 0.9 % IV BOLUS
30.0000 mL/kg | Freq: Once | INTRAVENOUS | Status: AC
  Administered 2020-06-06: 1000 mL via INTRAVENOUS

## 2020-06-06 MED ORDER — FENTANYL CITRATE (PF) 100 MCG/2ML IJ SOLN
25.0000 ug | INTRAMUSCULAR | Status: DC | PRN
  Administered 2020-06-07: 50 ug via INTRAVENOUS
  Administered 2020-06-07 (×5): 100 ug via INTRAVENOUS
  Filled 2020-06-06 (×7): qty 2

## 2020-06-06 MED ORDER — FENTANYL CITRATE (PF) 100 MCG/2ML IJ SOLN
25.0000 ug | INTRAMUSCULAR | Status: DC | PRN
  Filled 2020-06-06: qty 2

## 2020-06-06 MED ORDER — SODIUM CHLORIDE 0.9% FLUSH
10.0000 mL | Freq: Two times a day (BID) | INTRAVENOUS | Status: DC
  Administered 2020-06-06 – 2020-06-12 (×10): 10 mL

## 2020-06-06 MED ORDER — VANCOMYCIN HCL IN DEXTROSE 1-5 GM/200ML-% IV SOLN
1000.0000 mg | INTRAVENOUS | Status: DC
  Administered 2020-06-07 – 2020-06-08 (×2): 1000 mg via INTRAVENOUS
  Filled 2020-06-06 (×2): qty 200

## 2020-06-06 MED ORDER — CALCIUM CARBONATE ANTACID 1250 MG/5ML PO SUSP
500.0000 mg | Freq: Three times a day (TID) | ORAL | Status: DC
  Administered 2020-06-06: 500 mg
  Filled 2020-06-06 (×3): qty 5

## 2020-06-06 NOTE — Progress Notes (Signed)
Pt NT suctioned for small amount of thick tan sputum which was sent to the lab for culture.  Pt tolerated procedure reasonably well.

## 2020-06-06 NOTE — Progress Notes (Signed)
Events last few days noted.  She continues to be febrile with temperature as high as 101.5.  Laboratory data reviewed and continues to show monocytosis with total white cell count of 6.4 with neutrophil percentage around 20%, monocytes 58%.  The differential diagnosis of her monocytosis would be related to her autoimmune disorder, reactive recovery from previous methotrexate exposure versus a bone marrow disease.  Bone marrow disorder such as CMML, MDS and other myeloproliferative disorder are a consideration.  Given her unusual presentation with persistent fevers and mucositis/pharyngitis, I would recommend proceeding with a bone marrow biopsy to rule out underlying hematological disorder that could be under diagnosed.   This has been discussed previously with the patient and her daughter.  I will ask  interventional radiology to evaluate her for possible obtaining biopsy this week.

## 2020-06-06 NOTE — Procedures (Signed)
Intubation Procedure Note  JACQUELINA HEWINS  [id number]  11/14/1945  Date:06/06/20  Time:6:50 PM   Provider Performing:Zoe Creasman    Procedure: Intubation (31500)  Indication(s) Respiratory Failure  Consent Risks of the procedure as well as the alternatives and risks of each were explained to the patient and/or caregiver.  Consent for the procedure was obtained and is signed in the bedside chart   Anesthesia Etomidate 10 mg, Versed 2 mg, Fentanyl 25 mcg and Rocuronium 50 mg.   Time Out Verified patient identification, verified procedure, site/side was marked, verified correct patient position, special equipment/implants available, medications/allergies/relevant history reviewed, required imaging and test results available.   Sterile Technique Usual hand hygeine, masks, and gloves were used   Procedure Description Patient positioned in bed supine.  Sedation given as noted above.  Patient was intubated with endotracheal tube using Glidescope.  View was Grade 4 no glottis structures visible.  Number of attempts was 1.  Colorimetric CO2 detector was consistent with tracheal placement.   Complications/Tolerance Transient hypoxemia.  Oxygenation currently acceptable. Chest X-ray is ordered to verify placement.  Chesley Mires, MD [place] Pager - [phone] 06/06/2020, 6:52 PM

## 2020-06-06 NOTE — Consult Note (Signed)
NAME:  Katherine Wang, MRN:  [id number], DOB:  1946/04/14, LOS: 66 ADMISSION DATE:  06/15/2020, CONSULTATION DATE:  8/9 REFERRING MD:  Dr. Carolyn Stare, CHIEF COMPLAINT:  Sepsis   Brief History   74 year old female with RA on certolizumab for esophagitis, course complicated by worsening infection and odynophagia. Now complicated by poor secretion clearance, dyspnea, and hypotension concerning for sepsis. PCCM consulted.    History of present illness   74 year old female with past medical history as below, which is significant for rheumatoid arthritis formally on methotrexate currently managed on certolizumab, aortic insufficiency, diverticulosis, hypertension, thyroid disease, and remote stroke.  She presented [facility] urgency department on 8/3 with complaints of worsening sore throat x2 to 3 weeks.  She initially presented to primary care with these complaints and was ruled out for Covid and strep.  She was treated with amoxicillin and then doxycycline with no improvement.  She then developed cough and difficulty swallowing along with fevers up to 103 F at home.  She is admitted to [facility] on 8/3 with presumptive diagnosis of esophagitis, pharyngitis, supraglottitis.  She was treated with empiric antibiotics as well as acyclovir and initially steroids. Hospital course significant for ongoing infectious work-up.  Labs significant for leukopenia, neutropenia, lymphocytopenia with atypical mononuclear cells and blast cells on smear.  HSV antibody equivocal, CMV negative, EBV IgG positive with no detectable IgM.  Hematology has been consulted as well for a broad differential with plans for bone marrow biopsy.  Oral thrush also appreciated.  Core track tube was placed to aid in enteral nutrition as she has been unable to swallow.  8/8-8/9 the patient developed recurrent fever, tachycardia, tachypnea with concern for aspiration of secretions.  She has since become hypotensive as well which is yet to respond to IV  fluid administration.  PCCM was consulted 8/9 due to hypotension and respiratory compromise.  Past Medical History   has a past medical history of Adenomatous colon polyp (2012), Allergy, Anxiety, Aortic insufficiency, Arthritis, Basal cell carcinoma, Cataract, Depression, Diverticulosis, Fibromyalgia, Fibromyalgia, Hyperlipidemia, Hypertension, Macular degeneration, Melanoma ([place]), Murmur, heart, Reflux, Stroke ([place]) (20 years ago), and Thyroid disease.   Significant Hospital Events   8/3 admit 8/9 PCCM consult  Consults:  Heme/onc ENT - signed off IR PCCM  Procedures:    Significant Diagnostic Tests:  CT soft tissues neck 8/3 > pharyngitis and supraglottitis   Micro Data:  Grp A Strep 8/3 > neg BC 8/3 > neg HSV 8/3 > 1.08 HIV 8/6 > non-reactive EBV IgG 8/6 > greater than 600,  IGM negative CMV IgM 8/6 > negative Blood 8/9 >>> Urine 8/9 >>> Sputum 8/9 >>> Strep p. Urine 8/9 >>> Legionella urine 8/9 >>>  Antimicrobials:  Acyclovir 8/3 > Fluconazole 8/6 > Cefepime 8/9 > Vancomycin 8/9 >  Interim history/subjective:    Objective   Blood pressure (!) 86/59, pulse 96, temperature 97.7 F (36.5 C), temperature source Axillary, resp. rate (!) 33, height 5' (1.524 m), weight 74.1 kg, SpO2 95 %.       No intake or output data in the 24 hours ending 06/06/20 1126 Filed Weights   06/03/20 0500 06/04/20 0500 06/06/20 0500  Weight: 73.9 kg 74 kg 74.1 kg    Examination: General: Elderly appearing female in NAD HENT: [place]/AT, PERRL, no JVD Lungs: referred sound from upper airway secretions. Mild distress Cardiovascular: RRR, no MRG Abdomen: Soft, non-tender, non-distended Extremities: No acute deformity or ROM limitation Neuro: alert, oriented, non-focal. Garbled speech.  Resolved Hospital Problem list     Assessment & Plan:   Severe sepsis: differential is broad. Clearly she is aspirating to some extent, and likely has developed pneumonia as a result, but given  the constellation of her symptoms it is unclear this is the only source. She is receiving IVF bolus now. Lactic normal. Immunosuppressed from RA treatment.  - Transfer to ICU for ongoing care - Empiric antibiotics, acyclovir, diflucan to continue - Repeat blood, sputum cultures today.  - ICU hemodynamic monitoring - Norepinephrine for MAP goal > 65 if BP does not respond to IVF - check a cortisol level  Pharyngitis, Suproglottitis, mucositis, esophagitis Thrush - presumed to be viral.  - ENT following - Acyclovir, adding empiric ABX - Diflucan IV  Blood dyscrasia: leukopenia, neutropenia, lymphocytopenia, blast cells.  - Hematology following - For bone marrow biopsy - IR consulting for procedure.   Odynophagia Aspiration - Cortrack for tube feeds - Holding opioids briefly with hypotension.  - Pulmonary hygiene due to poor ability to clear oropharyngeal secretions. Including NTS.   AKI - BP support as needed - Gentle IVF - Follow BMP - Free water  Hypothyroid - Synthroid  Best practice:  Diet: TF Pain/Anxiety/Delirium protocol (if indicated): NA VAP protocol (if indicated): NA DVT prophylaxis: Lovenox GI prophylaxis: Pepcid Glucose control: NA Mobility: BR Code Status: FULL Family Communication: Daughter updated. Disposition: tx to ICU  Labs   CBC: Recent Labs  Lab 06/02/20 0324 06/03/20 0822 06/04/20 0130 06/05/20 0253 06/06/20 0303  WBC 2.9* 2.6* 4.2 5.5 6.4  NEUTROABS 1.0* 1.1* 1.5* 1.8 1.8  HGB 10.9* 10.2* 10.4* 10.6* 9.8*  HCT 32.1* 30.4* 31.2* 32.2* 30.7*  MCV 99.1 99.0 96.9 99.1 100.7*  PLT 275 224 229 198 703    Basic Metabolic Panel: Recent Labs  Lab 06/01/20 0525 06/03/20 0822 06/04/20 0130 06/05/20 0253 06/06/20 0303  NA 132* 132* 130* 133* 132*  K 3.6 2.7* 2.8* 3.7 4.1  CL 99 97* 95* 97* 98  CO2 22 22 24 26 24   GLUCOSE 126* 122* 168* 153* 196*  BUN 19 8 8 15 18   CREATININE 0.70 0.69 0.63 1.10* 1.03*  CALCIUM 8.2* 8.1* 8.0* 7.5*  7.0*  MG 2.1  --  2.0 1.9 1.9  PHOS  --   --  1.2* 2.2* 1.3*   GFR: Estimated Creatinine Clearance: 41.7 mL/min (A) (by C-G formula based on SCr of 1.03 mg/dL (H)). Recent Labs  Lab 06/21/2020 1225 06/07/2020 1230 06/03/20 0822 06/04/20 0130 06/05/20 0253 06/06/20 0303 06/06/20 0644 06/06/20 0646 06/06/20 1001  PROCALCITON  --   --   --   --   --   --  0.31  --   --   WBC  --    < > 2.6* 4.2 5.5 6.4  --   --   --   LATICACIDVEN 1.1  --   --   --   --   --   --  1.6 1.4   < > = values in this interval not displayed.    Liver Function Tests: Recent Labs  Lab 05/30/2020 1230  AST 27  ALT 20  ALKPHOS 59  BILITOT 1.4*  PROT 6.6  ALBUMIN 3.0*   No results for input(s): LIPASE, AMYLASE in the last 168 hours. No results for input(s): AMMONIA in the last 168 hours.  ABG    Component Value Date/Time   PHART 7.435 06/06/2020 0650   PCO2ART 36.7 06/06/2020 0650   PO2ART 91.2 06/06/2020 0650  HCO3 23.8 06/06/2020 0650   O2SAT 96.6 06/06/2020 0650     Coagulation Profile: No results for input(s): INR, PROTIME in the last 168 hours.  Cardiac Enzymes: No results for input(s): CKTOTAL, CKMB, CKMBINDEX, TROPONINI in the last 168 hours.  HbA1C: Hgb A1c MFr Bld  Date/Time Value Ref Range Status  08/13/2018 02:45 PM 5.9 4.6 - 6.5 % Final    Comment:    Glycemic Control Guidelines for People with Diabetes:Non Diabetic:  <6%Goal of Therapy: <7%Additional Action Suggested:  >8%   03/19/2016 10:37 AM 5.8 4.6 - 6.5 % Final    Comment:    Glycemic Control Guidelines for People with Diabetes:Non Diabetic:  <6%Goal of Therapy: <7%Additional Action Suggested:  >8%     CBG: Recent Labs  Lab 06/05/20 0812 06/05/20 1211 06/05/20 1624 06/05/20 2047 06/06/20 0640  GLUCAP 203* 195* 151* 164* 220*    Review of Systems: limited due to garbled speech.    Bolds are positive  Constitutional: weight loss, gain, night sweats, Fevers, chills, fatigue .  HEENT: headaches, Sore throat,  sneezing, nasal congestion, post nasal drip, Difficulty swallowing, Tooth/dental problems, visual complaints visual changes, ear ache CV:  chest pain, radiates:,Orthopnea, PND, swelling in lower extremities, dizziness, palpitations, syncope.  GI  heartburn, indigestion, abdominal pain, nausea, vomiting, diarrhea, change in bowel habits, loss of appetite, bloody stools.  Resp: cough, productive:, hemoptysis, dyspnea, chest pain, pleuritic (L flank).  Skin: rash or itching or icterus GU: dysuria, change in color of urine, urgency or frequency. flank pain, hematuria  MS: joint pain or swelling. decreased range of motion  Psych: change in mood or affect. depression or anxiety.  Neuro: difficulty with speech, weakness, numbness, ataxia    Past Medical History  She,  has a past medical history of Adenomatous colon polyp (2012), Allergy, Anxiety, Aortic insufficiency, Arthritis, Basal cell carcinoma, Cataract, Depression, Diverticulosis, Fibromyalgia, Fibromyalgia, Hyperlipidemia, Hypertension, Macular degeneration, Melanoma ([place]), Murmur, heart, Reflux, Stroke ([place]) (20 years ago), and Thyroid disease.   Surgical History    Past Surgical History:  Procedure Laterality Date   ABDOMINAL HYSTERECTOMY     APPENDECTOMY     BASAL CELL CARCINOMA EXCISION     nose    CARDIAC CATHETERIZATION     CHOLECYSTECTOMY     COLONOSCOPY     30 + years ago, unsure of where   EYE SURGERY     LASIK Bilateral    MELANOMA EXCISION     NASAL SEPTUM SURGERY     TUBAL LIGATION       Social History   reports that she quit smoking about 36 years ago. Her smoking use included cigarettes. She has a 40.00 pack-year smoking history. She has never used smokeless tobacco. She reports that she does not drink alcohol and does not use drugs.   Family History   Her family history includes Allergies in her father, mother, sister, and sister; Aneurysm in her son; Basal cell carcinoma in her daughter; Cancer in her  sister; Diabetes in her sister; Emphysema in her father; Epilepsy in her son; Heart attack in her father; Heart disease in her father and mother; Hyperlipidemia in her sister; Hypertension in her daughter, father, mother, sister, and sister; Hyperthyroidism in her daughter; Hypothyroidism in her daughter; Mental illness in her father and mother. There is no history of Colon cancer, Esophageal cancer, Rectal cancer, Stomach cancer, Pancreatic cancer, or Prostate cancer.   Allergies Allergies  Allergen Reactions   Tramadol Other (See Comments)    dizzy  Statins     Body ache     Home Medications  Prior to Admission medications   Medication Sig Start Date End Date Taking? Authorizing Provider  albuterol (VENTOLIN HFA) 108 (90 Base) MCG/ACT inhaler Inhale 2 puffs into the lungs every 6 (six) hours as needed for wheezing or shortness of breath. 04/11/20  Yes Copland, Gay Filler, MD  Alirocumab (PRALUENT) 150 MG/ML SOAJ Inject 150 mg into the skin every 14 (fourteen) days. 05/13/20  Yes Larey Dresser, MD  amLODipine (NORVASC) 10 MG tablet Take 1 tablet (10 mg total) by mouth daily. At bedtime 10/27/19  Yes Copland, Gay Filler, MD  aspirin EC 81 MG tablet Take 1 tablet (81 mg total) by mouth daily. 04/20/16  Yes Larey Dresser, MD  carvedilol (COREG) 3.125 MG tablet Take 1 tablet (3.125 mg total) by mouth 2 (two) times daily with a meal. 02/23/20  Yes Larey Dresser, MD  Certolizumab Pegol (CIMZIA) 2 X 200 MG KIT Inject 400 mg into the skin. Inject 400 mg at weeks 0,2,4, then every 4 weeks. Administered in office.   Yes [provider]  cholecalciferol (VITAMIN D3) 25 MCG (1000 UT) tablet Take 2,000 Units by mouth daily.   Yes [provider]  clonazePAM (KLONOPIN) 0.5 MG tablet TAKE 1 TABLET BY MOUTH AT BEDTIME. MAY TAKE 2 TIMES DAILY AS NEEDED ON OCCASION Patient taking differently: Take 0.5 mg by mouth at bedtime. MAY TAKE 2 TIMES DAILY AS NEEDED ON OCCASION 05/16/20  Yes  Copland, Gay Filler, MD  Cyanocobalamin (VITAMIN B 12 PO) Take 1 tablet by mouth daily.   Yes [provider]  cycloSPORINE (RESTASIS) 0.05 % ophthalmic emulsion Place 1 drop into both eyes 2 (two) times daily.    Yes [provider]  famotidine (PEPCID) 20 MG tablet Take 1 tablet (20 mg total) by mouth daily. 05/25/20  Yes Copland, Gay Filler, MD  hydrochlorothiazide (HYDRODIURIL) 12.5 MG tablet Take 1 Tablet Daily Patient taking differently: Take 12.5 mg by mouth daily.  09/07/19  Yes Larey Dresser, MD  ibuprofen (ADVIL,MOTRIN) 200 MG tablet Take 600 mg by mouth 2 (two) times daily as needed for mild pain.   Yes [provider]  irbesartan (AVAPRO) 300 MG tablet Take 1 tablet (300 mg total) by mouth daily. 06/01/19  Yes Larey Dresser, MD  levothyroxine (SYNTHROID) 112 MCG tablet Take 1 tablet (112 mcg total) by mouth daily. 04/26/20  Yes Copland, Gay Filler, MD  montelukast (SINGULAIR) 10 MG tablet Take 1 tablet (10 mg total) by mouth at bedtime. 05/23/20  Yes Copland, Gay Filler, MD  Multiple Vitamins-Minerals (PRESERVISION AREDS) TABS Take 1 tablet by mouth 2 (two) times daily.   Yes [provider]  ondansetron (ZOFRAN ODT) 4 MG disintegrating tablet Take 1 tablet (4 mg total) by mouth every 8 (eight) hours as needed for nausea or vomiting. 05/28/20  Yes Letitia Neri L, PA-C  pantoprazole (PROTONIX) 40 MG tablet Take 1 tablet (40 mg total) by mouth daily. 05/23/20  Yes Copland, Gay Filler, MD  rosuvastatin (CRESTOR) 10 MG tablet Take 1 tablet (10 mg total) by mouth daily. 12/08/19  Yes Larey Dresser, MD  sertraline (ZOLOFT) 100 MG tablet TAKE 2 TABLETS(200 MG) BY MOUTH DAILY Patient taking differently: Take 200 mg by mouth daily.  05/25/20  Yes Copland, Gay Filler, MD  doxycycline (VIBRAMYCIN) 100 MG capsule Take 1 capsule (100 mg total) by mouth 2 (two) times daily. Patient not taking: Reported on 06/19/2020  05/26/20   Copland, Gay Filler, MD  magic mouthwash SOLN 2  tsp ac and hs  Swish and spit Patient not taking: Reported on 06/17/2020 05/28/20   Johnn Hai, PA-C  potassium chloride (KLOR-CON) 20 MEQ packet Take 20 mEq by mouth once for 1 dose. Patient not taking: Reported on 05/29/2020 05/28/20 06/08/2020  Johnn Hai, PA-C     Critical care time: 45 minutes     Georgann Housekeeper, AGACNP-BC [place] for personal pager PCCM on call pager [phone]  06/06/2020 11:56 AM

## 2020-06-06 NOTE — Significant Event (Addendum)
Rapid Response Event Note   Reason for Call :  Called d/t HR-120s and labored breathing.   Initial Focused Assessment:  Pt laying in bed with eyes open, mumbling. Pt is alert and oriented, confused at times,  c/o SOB, denying chest pain.  Breathing is labored. Lungs are rhonchus t/o. Skin is hot to touch. Pt has a very weak cough. Gurgling heard in back of throat. Pt has cortrac tube with TF infusing. T-101.5, HR-122, BP-135/69, RR-38, SpO2-94% on 2L .      Interventions:  Xopenex-ordered prior to my arrival Deep throat suctioning done-moderate amount thick white secretions.  PRN Tylenol PR given for fever PCXR   Plan of Care:  Pt has a fever. Treating this should improve tachycardia, tachypnea, and hopefully confusion. Pt breathing is labored but she is maintaining her SpO2. Xopenex given PTA RRT-monitor for improved WOB.  Await PCXR results and relay to PCP. Continue to monitor pt. Call RRT if further assistance needed.   Update:  0610>PCXR-1. Cardiomegaly without failure. 2. Progressive bibasilar airspace disease. Disease progression and new effusions is concerning for pneumonia Pt breathing still labored. T-102.4 1 hour after tylenol suppository given. SBP-100>1L NS bolus, 15mg  toradol, pct, LA, vanc/maxipime ordered. TRH in house MD coming to see pt.   0635-Pt is more awake and less confused. Breathing is less labored.    Event Summary:   MD NotifiedKennon Holter, NP notified PTA RRT Call [place] [facility] [id number]  Dillard Essex, RN

## 2020-06-06 NOTE — Progress Notes (Addendum)
PROGRESS NOTE    Katherine Wang  ASN:[id number] DOB: Sep 16, 1946 DOA: 06/11/2020 PCP: [name], MD   Brief Narrative: Katherine Wang is a 74 y.o. female history of hypertension, hyperlipidemia, hypothyroidism, anxiety, depression rheumatoid arthritis.  Patient presented secondary to worsening sore throat over the last 2 to 3 weeks and found to have multiple aphthous ulcers with evidence of pharyngitis and supraglottitis with associated upper cervical esophagitis.  ENT consult on admission with no recommendation for airway management at this time.   Assessment & Plan:   Active Problems:   Hypothyroidism   Essential hypertension   Anxiety   Viral pharyngitis   Hypokalemia   Supraglottitis   Esophagitis   Odynophagia   Viral pharyngitis Esophagitis Supraglottitis Mucositis  In setting of leukopenia with neutropenia and lymphocytopenia. ENT consulted and recommendations for supportive care. Blood smear significant for atypical mononuclear cells and blast cells. HSV ab equivocal. CMV negative. EBV IgG positive with undetectable IgM so unlikely acute EBV infection. -Continue acyclovir -ENT recommendations: supportive care  Leukopenia Neutropenia/Lymphocytopenia Mild. Patient was previously on methotrexate which appears to be the cause. Neutropenia is improved. Will need to consider EBV. Hematology consulted for below and recommendations below  Blast cells Concerning in addition to monocytosis and unusual overall presentation -Oncology recommendations: IR consult for bone marrow biopsy  Odynophagia Secondary to above. Managed with IV pain medication. Unable to eat secondary to pain. Patient with visible thrush; possibly an element of esophageal esophagitis. NG tube placed on 8/6 and tube feeds initiated. Improved per patient. -Oxycodone 24m prn per tube; dilaudid prn -Treat below as well  Severe Sepsis Not present on admission. Patient had episode of recurrent fever,  tachycardia and tachypnea in setting of likely aspiration from secretions and opiate medications. Lactic acid WNL but now patient has developed hypotension. Likely source is aspiration pneumonia as evidenced by worsening chest x-ray. Previous blood cultures were negative. Addendum: patient developed hypotension. 30 cc/kg bolus initiated. With concern for respiratory status, PCCM consulted for assessment and possible management.  -Plan below -Discontinue Coreg  Aspiration pneumonia In setting of secretions and lethargy likely from opiate use. -Vancomycin/Cefepime IV -MRSA pcr; if negative will discontinue Vancomycin -Check legionella/strep pneumo -Sputum culture if able (unlikely) -Procalcitonin   Oral thrush Possible esophageal esophagitis Appears to be new. Possibly associated esophageal candidiasis with odynophagia -Fluconazole IV  Tube feeds -Continue tube feeds. Goal rate of 60 ml/hr per dietitian recommendations -Potassium, magnesium, phosphorus daily; replete as needed per tube  Hypocalcemia Corrected calcium of about 7.8-8 -Calcium carbonate supplementation per tube -Check 25-Vitamin D  Tachycardia Possibly secondary to not receiving home Coreg. Patient also with rales but no increased work of breathing. Possible response to pain. BP normal. EKG obtained and significant for sinus tachycardia. In setting of pneumonia. -Discontinue Coreg in setting of severe sepsis  AKI Baseline creatinine of 0.7. Up to 1.10 in setting of discontinuing IV fluids after tube feeds initiated. Stable. Patient has been having good UOP. -Continue IV fluids  Hyponatremia Secondary to poor fluid intake. Improved with IV fluids initially and slightly down again -Nutrition as above  Ground-glass nodule Noted incidentally on CT scan.  Nodule described as a 6 mm groundglass nodule in the left lung apex.  Recommendation for follow-up CT at 6 to 12 months.  Patient will need outpatient  follow-up.  Hypothyroidism Patient is on Synthroid 112 mcg daily -Synthroid per tube  Essential hypertension Patient is on amlodipine, hydrochlorothiazide and irbesartan as an outpatient. BP mostly controlled -Hydralazine  prn  GERD Patient is on Pepcid and Protonix as an outpatient. Currently unable to swallow well secondary to above -Pepcid per tube  Rheumatoid arthritis Patient was previously on methotrexate and is now on alirocumab as an outpatient.   DVT prophylaxis: Lovenox Code Status:   Code Status: Full Code Family Communication: Daughter on telephone Disposition Plan: Transfer to progressive unit. Discharge home likely in several days pending improved ability to swallow and take in PO in addition to hematology workup with bone marrow biopsy planned   Consultants:   ENT  Oncology  Procedures:   None  Antimicrobials:  Acyclovir IV    Subjective: Throat pain somewhat improved. Rapid response called overnight for tachycardia, fever, lethargy. Patient received IV fluids. ABG performed and is unremarkable.  Objective: Vitals:   06/06/20 0530 06/06/20 0630 06/06/20 0716 06/06/20 0739  BP: 135/69 120/80 103/68 103/68  Pulse: (!) 126 (!) 114 (!) 106 (!) 106  Resp: 20 20 (!) 30 (!) 30  Temp: (!) 101.5 F (38.6 C) (!) 101.5 F (38.6 C) 99.6 F (37.6 C) 99.6 F (37.6 C)  TempSrc:  Axillary  Axillary  SpO2: 97%   97%  Weight:      Height:       No intake or output data in the 24 hours ending 06/06/20 0746 Filed Weights   06/03/20 0500 06/04/20 0500 06/06/20 0500  Weight: 73.9 kg 74 kg 74.1 kg    Examination:  General exam: Appears calm and comfortable HEENT: mucositis, no thrush noted. Thick secretions.  Respiratory system: Rhonchi but appear to be transmitted. Mild tachypnea. Cardiovascular system: S1 & S2 heard, RRR. No murmurs, rubs, gallops or clicks. Gastrointestinal system: Abdomen is nondistended, soft and nontender. No organomegaly or masses  felt. Normal bowel sounds heard. Central nervous system: Alert and oriented. No focal neurological deficits. Musculoskeletal: No edema. No calf tenderness Skin: No cyanosis. No rashes Psychiatry: Judgement and insight appear normal. Mood & affect appropriate.       Data Reviewed: I have personally reviewed following labs and imaging studies  CBC Lab Results  Component Value Date   WBC 6.4 06/06/2020   RBC 3.05 (L) 06/06/2020   HGB 9.8 (L) 06/06/2020   HCT 30.7 (L) 06/06/2020   MCV 100.7 (H) 06/06/2020   MCH 32.1 06/06/2020   PLT 197 06/06/2020   MCHC 31.9 06/06/2020   RDW 15.1 06/06/2020   LYMPHSABS 0.7 06/06/2020   MONOABS 3.7 (H) 06/06/2020   EOSABS 0.0 06/06/2020   BASOSABS 0.0 46/27/0350     Last metabolic panel Lab Results  Component Value Date   NA 132 (L) 06/06/2020   K 4.1 06/06/2020   CL 98 06/06/2020   CO2 24 06/06/2020   BUN 18 06/06/2020   CREATININE 1.03 (H) 06/06/2020   GLUCOSE 196 (H) 06/06/2020   GFRNONAA 53 (L) 06/06/2020   GFRAA >60 06/06/2020   CALCIUM 7.0 (L) 06/06/2020   PHOS 1.3 (L) 06/06/2020   PROT 6.6 06/05/2020   ALBUMIN 3.0 (L) 06/20/2020   BILITOT 1.4 (H) 06/16/2020   ALKPHOS 59 06/01/2020   AST 27 06/08/2020   ALT 20 06/18/2020   ANIONGAP 10 06/06/2020    CBG (last 3)  Recent Labs    06/05/20 1624 06/05/20 2047 06/06/20 0640  GLUCAP 151* 164* 220*     GFR: Estimated Creatinine Clearance: 41.7 mL/min (A) (by C-G formula based on SCr of 1.03 mg/dL (H)).  Coagulation Profile: No results for input(s): INR, PROTIME in the last 168 hours.  Recent Results (from the past 240 hour(s))  SARS Coronavirus 2 by RT PCR (hospital order, performed in [facility] hospital lab) Nasopharyngeal Nasopharyngeal Swab     Status: None   Collection Time: 06/09/2020 12:06 PM   Specimen: Nasopharyngeal Swab  Result Value Ref Range Status   SARS Coronavirus 2 NEGATIVE NEGATIVE Final    Comment: (NOTE) SARS-CoV-2 target nucleic acids are NOT  DETECTED.  The SARS-CoV-2 RNA is generally detectable in upper and lower respiratory specimens during the acute phase of infection. The lowest concentration of SARS-CoV-2 viral copies this assay can detect is 250 copies / mL. A negative result does not preclude SARS-CoV-2 infection and should not be used as the sole basis for treatment or other patient management decisions.  A negative result may occur with improper specimen collection / handling, submission of specimen other than nasopharyngeal swab, presence of viral mutation(s) within the areas targeted by this assay, and inadequate number of viral copies (<250 copies / mL). A negative result must be combined with clinical observations, patient history, and epidemiological information.  Fact Sheet for Patients:   StrictlyIdeas.no  Fact Sheet for Healthcare Providers: BankingDealers.co.za  This test is not yet approved or  cleared by the [place] FDA and has been authorized for detection and/or diagnosis of SARS-CoV-2 by FDA under an Emergency Use Authorization (EUA).  This EUA will remain in effect (meaning this test can be used) for the duration of the COVID-19 declaration under Section 564(b)(1) of the Act, 21 U.S.C. section 360bbb-3(b)(1), unless the authorization is terminated or revoked sooner.  Performed at [facility], [place] [address], [place], [place] [address]   Group A Strep by PCR     Status: None   Collection Time: 06/03/2020 12:06 PM  Result Value Ref Range Status   Group A Strep by PCR NOT DETECTED NOT DETECTED Final    Comment: Performed at [facility], [place] [address], [place], [place] [address]  Blood culture (routine x 2)     Status: None   Collection Time: 06/04/2020 12:33 PM   Specimen: BLOOD  Result Value Ref Range Status   Specimen Description BLOOD RIGHT ANTECUBITAL  Final   Special Requests   Final    BOTTLES DRAWN AEROBIC AND ANAEROBIC  Blood Culture adequate volume   Culture   Final    NO GROWTH 5 DAYS Performed at [facility], [place] [address] [place], [place] [address]    Report Status 06/05/2020 FINAL  Final        Radiology Studies: DG Chest Port 1 View  Result Date: 06/06/2020 CLINICAL DATA:  Acute respiratory disease. EXAM: PORTABLE CHEST 1 VIEW COMPARISON:  One-view chest x-ray 06/05/2020 FINDINGS: Heart is enlarged. Atherosclerotic changes are present at the aortic arch. Lung volumes are low. Progressive bibasilar airspace opacities are noted. Facet effusions are suspected. IMPRESSION: 1. Cardiomegaly without failure. 2. Progressive bibasilar airspace disease. Disease progression and new effusions is concerning for pneumonia Electronically Signed   By: San Morelle M.D.   On: 06/06/2020 06:04   DG CHEST PORT 1 VIEW  Result Date: 06/05/2020 CLINICAL DATA:  Respiratory distress EXAM: PORTABLE CHEST 1 VIEW COMPARISON:  06/28/2020 chest radiograph. FINDINGS: Enteric tube enters stomach with the tip not seen on this image. Stable cardiomediastinal silhouette with top-normal heart size. No pneumothorax. No pleural effusion. Patchy opacities at the left greater than right lung bases. No pulmonary edema. IMPRESSION: Patchy opacities at the left greater than right lung bases,  suspicious for aspiration or pneumonia. Chest radiograph follow-up advised. Electronically Signed   By: Ilona Sorrel M.D.   On: 06/05/2020 13:24        Scheduled Meds: . carvedilol  3.125 mg Per NG tube BID WC  . enoxaparin (LOVENOX) injection  40 mg Subcutaneous Q24H  . famotidine  20 mg Per Tube Daily  . free water  120 mL Per Tube Q6H  . levothyroxine  112 mcg Per NG tube QAC breakfast  . rosuvastatin  10 mg Per Tube Daily  . sodium chloride flush  3 mL Intravenous Q12H   Continuous Infusions: . sodium chloride 75 mL/hr at 06/06/20 0000  . acyclovir 550 mg (06/05/20 1716)  . ceFEPime (MAXIPIME) IV 2 g (06/06/20 0713)  .  feeding supplement (OSMOLITE 1.2 CAL) 1,000 mL (06/06/20 0353)  . fluconazole (DIFLUCAN) IV 100 mg (06/05/20 1141)  . [START ON 06/07/2020] vancomycin    . vancomycin       LOS: 6 days     Cordelia Poche, MD [facility] 06/06/2020, 7:46 AM  If 7PM-7AM, please contact night-coverage [email or web address]

## 2020-06-06 NOTE — Progress Notes (Signed)
ETT retracted 3cm per Md order.  ETT secure at 21 at the lips.

## 2020-06-06 NOTE — Procedures (Signed)
Central Venous Catheter Insertion Procedure Note  MEKA LEWAN  [id number]  August 06, 1946  Date:06/06/20  Time:8:22 PM   Provider Performing:Arrion Burruel Bernell List   Procedure: Insertion of Non-tunneled Central Venous [phone]) with US guidance (02637)   Indication(s) Medication administration and Difficult access  Consent Risks of the procedure as well as the alternatives and risks of each were explained to the patient and/or caregiver.  Consent for the procedure was obtained and is signed in the bedside chart  Anesthesia Topical only with 1% lidocaine   Timeout Verified patient identification, verified procedure, site/side was marked, verified correct patient position, special equipment/implants available, medications/allergies/relevant history reviewed, required imaging and test results available.  Sterile Technique Maximal sterile technique including full sterile barrier drape, hand hygiene, sterile gown, sterile gloves, mask, hair covering, sterile ultrasound probe cover (if used).  Procedure Description Area of catheter insertion was cleaned with chlorhexidine and draped in sterile fashion.  With real-time ultrasound guidance a central venous catheter was placed into the right internal jugular vein. Nonpulsatile blood flow and easy flushing noted in all ports.  The catheter was sutured in place and sterile dressing applied.  Complications/Tolerance None; patient tolerated the procedure well. Chest X-ray is ordered to verify placement for internal jugular or subclavian cannulation.   Chest x-ray is not ordered for femoral cannulation.  EBL Minimal  Specimen(s) None

## 2020-06-06 NOTE — Progress Notes (Signed)
Pt arrived from unit from [place] . BP 101/69 SPo299 NC2L. AY04 RR26. Red MEWS on admission to unit .  New IV placed Left AC.Will continue to monitor. Pt. Oriented to unit   Phoebe Sharps, RN

## 2020-06-06 NOTE — Procedures (Signed)
Arterial Catheter Insertion Procedure Note  Katherine Wang  [id number]  Mar 02, 1946  Date:06/06/20  Time:8:54 PM    Provider Performing: Clance Boll    Procedure: Insertion of Arterial Line [phone]) without US guidance  Indication(s) Blood pressure monitoring and/or need for frequent ABGs  Consent Unable to obtain consent due to emergent nature of procedure.  Anesthesia None   Time Out Verified patient identification, verified procedure, site/side was marked, verified correct patient position, special equipment/implants available, medications/allergies/relevant history reviewed, required imaging and test results available.   Sterile Technique Maximal sterile technique including full sterile barrier drape, hand hygiene, sterile gown, sterile gloves, mask, hair covering, sterile ultrasound probe cover (if used).   Procedure Description Area of catheter insertion was cleaned with chlorhexidine and draped in sterile fashion. With real-time ultrasound guidance an arterial catheter was placed into the left radial artery.  Appropriate arterial tracings confirmed on monitor.     Complications/Tolerance None; patient tolerated the procedure well.   EBL Minimal   Specimen(s) None

## 2020-06-06 NOTE — Progress Notes (Signed)
Pt BP 88/57 (68) HR92 spo2 95 NC2L.Marland Kitchen Updated DR.Nettey on decrease in BP and new order for IV bolus started. Will continue to monitor   Phoebe Sharps, RN

## 2020-06-06 NOTE — Plan of Care (Signed)
  Problem: Education: Goal: Knowledge of General Education information will improve Description Including pain rating scale, medication(s)/side effects and non-pharmacologic comfort measures Outcome: Progressing   

## 2020-06-06 NOTE — Progress Notes (Signed)
   06/06/20 0530  Assess: MEWS Score  Temp (!) 101.5 F (38.6 C)  BP 135/69  Pulse Rate (!) 126  Resp 20  SpO2 97 %  O2 Device Nasal Cannula  O2 Flow Rate (L/min) 2 L/min  Assess: MEWS Score  MEWS Temp 2  MEWS Systolic 0  MEWS Pulse 2  MEWS RR 0  MEWS LOC 0  MEWS Score 4  MEWS Score Color Red  Assess: if the MEWS score is Yellow or Red  Were vital signs taken at a resting state? Yes  Focused Assessment Change from prior assessment (see assessment flowsheet)  Early Detection of Sepsis Score *See Row Information* Low  Treat  MEWS Interventions Administered scheduled meds/treatments;Administered prn meds/treatments;Escalated (See documentation below)  Pain Scale PAINAD  Breathing 2  Negative Vocalization 1  Facial Expression 2  Body Language 1  Consolability 1  PAINAD Score 7  Complains of Shortness of breath;Fever;Coughing  Interventions Medication (see MAR)  Take Vital Signs  Increase Vital Sign Frequency  Red: Q 1hr X 4 then Q 4hr X 4, if remains red, continue Q 4hrs  Escalate  MEWS: Escalate Red: discuss with charge nurse/RN and provider, consider discussing with RRT  Notify: Charge Nurse/RN  Name of Charge Nurse/RN Notified Daune Perch   Date Charge Nurse/RN Notified 06/06/20  Time Charge Nurse/RN Notified 0500  Notify: Provider  Provider Name/Title X.Blount  Date Provider Notified 06/06/20  Time Provider Notified 0430  Notification Type Page  Notification Reason Change in status  Response See new orders  Date of Provider Response 06/06/20  Time of Provider Response 0500  Notify: Rapid Response  Name of Rapid Response RN Notified [place]   Date Rapid Response Notified 06/06/20  Time Rapid Response Notified 0510  Document  Patient Outcome Not stable and remains on department (awaiting x-ray results and monitor closely)  Progress note created (see row info) Yes

## 2020-06-06 NOTE — Progress Notes (Signed)
Patient ID: Katherine Wang, female   DOB: 07/28/1946, 74 y.o.   MRN: [id number]  Follow-up visit.  She is awake and alert.  Breathing is clear.  She is somewhat tachypneic but she does not have stridor.  Oral cavity and pharynx reveals continued mucositis throughout the oropharynx.  There does seem to be much less erythema and there is surface exudate.  She now has a feeding tube in place, nasogastric type.  With white blood cell count recovering and with nutrition being provided hopefully she will heal somewhat faster.  We will continue to follow and continue supportive care.

## 2020-06-06 NOTE — Progress Notes (Signed)
Pt tranfered 2M04 with all belonging. Daughter Levada Dy)  Notified.  Phoebe Sharps, RN

## 2020-06-06 NOTE — Consult Note (Signed)
Chief Complaint: Patient was seen in consultation today for bone marrow biopsy Chief Complaint  Patient presents with  . Sore Throat   at the request of Dr Dahlia Byes   Supervising Physician: Corrie Mckusick  Patient Status: [facility] - In-pt  History of Present Illness: Katherine Wang is a 74 y.o. female   HTN; HLD; Hypo thyroid; Rh arthritis Presented to ED 8/3 with several days of worsening sore throat Actual sores in throat Many Covid and strep tests neg Antibiotics with no relief Fever of up to 103 Noted odynophagia  Labs in ED revealed monocytosis  Strep neg Covid neg Viral pharyngitis Leukopenia  Consultation with Hem/Onc- Dr Alen Blew 1.  Neutropenia: Review of the patient's labs indicate that the patient has been persistently neutropenic since at least January 2019. However, she was on methotrexate and Arava. Her neutropenia worsened in April 2021 and these drugs were stopped. She was subsequently placed on prednisone for treatment of RA and her WBC eventually normalized. Now has neutropenia again after starting Cimzia. WBC initially dropped compared to admission but is slowly trending upward. Pathology indicated circulating blasts on peripheral blood smear. Suspect neutropenia related to Cimzia and that neutropenia will slowly resolve once medication is out of her system. Recommend close monitoring of CBC with differential.  Peripheral smear was personally reviewed and does show monocytosis with rare blasts.  The differential diagnosis of these findings were reviewed today with the patient.  Reactive bone marrow findings are typical associated with recovery after methotrexate myelosuppression.  It is also possible that she could be developing bone marrow disorder such as MDS and less likely leukemia.  I recommended continued observation for the time being and monitoring her counts closely while she is hospitalized.  She continues to have increased monocytosis or increased  blasts, a bone marrow biopsy may be needed to further evaluate her condition.  She understands that and she is in agreement for the time being.  All her questions were answered to her satisfaction.  Scheduled for bone marrow bx 8/10 in IR Consented with Dtr Levada Dy via phone    Past Medical History:  Diagnosis Date  . Adenomatous colon polyp 2012  . Allergy   . Anxiety   . Aortic insufficiency   . Arthritis   . Basal cell carcinoma   . Cataract   . Depression   . Diverticulosis   . Fibromyalgia   . Fibromyalgia   . Hyperlipidemia   . Hypertension   . Macular degeneration   . Melanoma ([place])   . Murmur, heart   . Reflux   . Stroke ([place]) 20 years ago   per pt. mild stroke  . Thyroid disease    hypothroidism     Past Surgical History:  Procedure Laterality Date  . ABDOMINAL HYSTERECTOMY    . APPENDECTOMY    . BASAL CELL CARCINOMA EXCISION     nose   . CARDIAC CATHETERIZATION    . CHOLECYSTECTOMY    . COLONOSCOPY     30 + years ago, unsure of where  . EYE SURGERY    . LASIK Bilateral   . MELANOMA EXCISION    . NASAL SEPTUM SURGERY    . TUBAL LIGATION      Allergies: Tramadol and Statins  Medications: Prior to Admission medications   Medication Sig Start Date End Date Taking? Authorizing Provider  albuterol (VENTOLIN HFA) 108 (90 Base) MCG/ACT inhaler Inhale 2 puffs into the lungs every 6 (six) hours as needed for  wheezing or shortness of breath. 04/11/20  Yes Copland, Gay Filler, MD  Alirocumab (PRALUENT) 150 MG/ML SOAJ Inject 150 mg into the skin every 14 (fourteen) days. 05/13/20  Yes Larey Dresser, MD  amLODipine (NORVASC) 10 MG tablet Take 1 tablet (10 mg total) by mouth daily. At bedtime 10/27/19  Yes Copland, Gay Filler, MD  aspirin EC 81 MG tablet Take 1 tablet (81 mg total) by mouth daily. 04/20/16  Yes Larey Dresser, MD  carvedilol (COREG) 3.125 MG tablet Take 1 tablet (3.125 mg total) by mouth 2 (two) times daily with a meal. 02/23/20  Yes Larey Dresser, MD  Certolizumab Pegol (CIMZIA) 2 X 200 MG KIT Inject 400 mg into the skin. Inject 400 mg at weeks 0,2,4, then every 4 weeks. Administered in office.   Yes [provider]  cholecalciferol (VITAMIN D3) 25 MCG (1000 UT) tablet Take 2,000 Units by mouth daily.   Yes [provider]  clonazePAM (KLONOPIN) 0.5 MG tablet TAKE 1 TABLET BY MOUTH AT BEDTIME. MAY TAKE 2 TIMES DAILY AS NEEDED ON OCCASION Patient taking differently: Take 0.5 mg by mouth at bedtime. MAY TAKE 2 TIMES DAILY AS NEEDED ON OCCASION 05/16/20  Yes Copland, Gay Filler, MD  Cyanocobalamin (VITAMIN B 12 PO) Take 1 tablet by mouth daily.   Yes [provider]  cycloSPORINE (RESTASIS) 0.05 % ophthalmic emulsion Place 1 drop into both eyes 2 (two) times daily.    Yes [provider]  famotidine (PEPCID) 20 MG tablet Take 1 tablet (20 mg total) by mouth daily. 05/25/20  Yes Copland, Gay Filler, MD  hydrochlorothiazide (HYDRODIURIL) 12.5 MG tablet Take 1 Tablet Daily Patient taking differently: Take 12.5 mg by mouth daily.  09/07/19  Yes Larey Dresser, MD  ibuprofen (ADVIL,MOTRIN) 200 MG tablet Take 600 mg by mouth 2 (two) times daily as needed for mild pain.   Yes [provider]  irbesartan (AVAPRO) 300 MG tablet Take 1 tablet (300 mg total) by mouth daily. 06/01/19  Yes Larey Dresser, MD  levothyroxine (SYNTHROID) 112 MCG tablet Take 1 tablet (112 mcg total) by mouth daily. 04/26/20  Yes Copland, Gay Filler, MD  montelukast (SINGULAIR) 10 MG tablet Take 1 tablet (10 mg total) by mouth at bedtime. 05/23/20  Yes Copland, Gay Filler, MD  Multiple Vitamins-Minerals (PRESERVISION AREDS) TABS Take 1 tablet by mouth 2 (two) times daily.   Yes [provider]  ondansetron (ZOFRAN ODT) 4 MG disintegrating tablet Take 1 tablet (4 mg total) by mouth every 8 (eight) hours as needed for nausea or vomiting. 05/28/20  Yes Letitia Neri L, PA-C  pantoprazole (PROTONIX) 40 MG tablet Take 1 tablet  (40 mg total) by mouth daily. 05/23/20  Yes Copland, Gay Filler, MD  rosuvastatin (CRESTOR) 10 MG tablet Take 1 tablet (10 mg total) by mouth daily. 12/08/19  Yes Larey Dresser, MD  sertraline (ZOLOFT) 100 MG tablet TAKE 2 TABLETS(200 MG) BY MOUTH DAILY Patient taking differently: Take 200 mg by mouth daily.  05/25/20  Yes Copland, Gay Filler, MD  doxycycline (VIBRAMYCIN) 100 MG capsule Take 1 capsule (100 mg total) by mouth 2 (two) times daily. Patient not taking: Reported on 06/27/2020 05/26/20   Copland, Gay Filler, MD  magic mouthwash SOLN 2 tsp ac and hs  Swish and spit Patient not taking: Reported on 06/28/2020 05/28/20   Johnn Hai, PA-C  potassium chloride (KLOR-CON) 20 MEQ packet Take 20 mEq by mouth once for 1 dose. Patient  not taking: Reported on 06/09/2020 05/28/20 06/21/2020  Johnn Hai, PA-C     Family History  Problem Relation Age of Onset  . Heart disease Mother   . Hypertension Mother   . Mental illness Mother   . Allergies Mother   . Heart disease Father   . Hypertension Father   . Mental illness Father   . Emphysema Father        smoked  . Allergies Father   . Heart attack Father   . Hyperlipidemia Sister   . Hypertension Sister   . Allergies Sister   . Cancer Sister        oral  . Hypertension Sister   . Diabetes Sister   . Allergies Sister   . Epilepsy Son   . Aneurysm Son   . Hyperthyroidism Daughter   . Hypothyroidism Daughter   . Hypertension Daughter   . Basal cell carcinoma Daughter   . Colon cancer Neg Hx   . Esophageal cancer Neg Hx   . Rectal cancer Neg Hx   . Stomach cancer Neg Hx   . Pancreatic cancer Neg Hx   . Prostate cancer Neg Hx     Social History   Socioeconomic History  . Marital status: Divorced    Spouse name: Not on file  . Number of children: 3  . Years of education: Not on file  . Highest education level: Not on file  Occupational History  . Occupation: retired  Tobacco Use  . Smoking status: Former Smoker     Packs/day: 2.00    Years: 20.00    Pack years: 40.00    Types: Cigarettes    Quit date: 10/30/1983    Years since quitting: 36.6  . Smokeless tobacco: Never Used  Vaping Use  . Vaping Use: Never used  Substance and Sexual Activity  . Alcohol use: No    Alcohol/week: 0.0 standard drinks  . Drug use: Never  . Sexual activity: Not on file  Other Topics Concern  . Not on file  Social History Narrative   Lives alone in a one story home.  Has 2 living children.  Retired Engineer, water.     Investment banker, operational of Radio broadcast assistant Strain:   . Difficulty of Paying Living Expenses:   Food Insecurity:   . Worried About Charity fundraiser in the Last Year:   . Arboriculturist in the Last Year:   Transportation Needs:   . Film/video editor (Medical):   Marland Kitchen Lack of Transportation (Non-Medical):   Physical Activity:   . Days of Exercise per Week:   . Minutes of Exercise per Session:   Stress:   . Feeling of Stress :   Social Connections:   . Frequency of Communication with Friends and Family:   . Frequency of Social Gatherings with Friends and Family:   . Attends Religious Services:   . Active Member of Clubs or Organizations:   . Attends Archivist Meetings:   Marland Kitchen Marital Status:     Review of Systems: A 12 point ROS discussed and pertinent positives are indicated in the HPI above.  All other systems are negative.  Review of Systems  Constitutional: Positive for activity change, appetite change and fever.  HENT: Positive for mouth sores, sore throat and trouble swallowing.   Respiratory: Negative for cough and shortness of breath.   Cardiovascular: Negative for chest pain.  Gastrointestinal: Negative for abdominal pain.  Psychiatric/Behavioral: Positive for  confusion.    Vital Signs: BP (!) 92/53 (BP Location: Right Arm)   Pulse (!) 101   Temp 98.5 F (36.9 C) (Axillary)   Resp (!) 35   Ht 5' (1.524 m)   Wt 163 lb 5.8 oz (74.1 kg)   SpO2 96%   BMI  31.90 kg/m   Physical Exam Vitals reviewed.  Cardiovascular:     Rate and Rhythm: Normal rate and regular rhythm.  Pulmonary:     Effort: Pulmonary effort is normal.  Abdominal:     Palpations: Abdomen is soft.  Musculoskeletal:     Cervical back: Normal range of motion.  Skin:    General: Skin is warm.  Neurological:     Mental Status: She is alert.     Comments: Spoke to [employer] via phone for consent     Imaging: CT Soft Tissue Neck W Contrast  Result Date: 06/02/2020 CLINICAL DATA:  Epiglottitis or tonsillitis suspected; worsening difficulty breathing, sensation of "a ball" in throat, not able to tolerate saliva, fluids or breathing well. Viral appearing rash on back of throat. EXAM: CT NECK WITH CONTRAST TECHNIQUE: Multidetector CT imaging of the neck was performed using the standard protocol following the bolus administration of intravenous contrast. CONTRAST:  30m OMNIPAQUE IOHEXOL 300 MG/ML  SOLN COMPARISON:  No pertinent prior exams are available for comparison. FINDINGS: Pharynx and larynx: There is diffuse abnormal mucosal enhancement along the palatine tonsils, within the oropharynx, within the vallecular and within the supraglottic larynx. There is also prominent abnormal mucosal enhancement within the hypopharynx and upper cervical esophagus. There is prominent mucosal/submucosal edema affecting the palatine tonsils, oropharynx, epiglottis and supraglottic larynx. No evidence of soft tissue abscess. No retropharyngeal collection Salivary glands: No inflammation, mass, or stone. Thyroid: Unremarkable. Lymph nodes: Mildly enlarged right level II lymph node measuring 12 mm in short axis (series 11, image 27). No pathologically enlarged cervical chain lymph nodes identified elsewhere within the neck. Vascular: The major vascular structures of the neck are patent. Calcified atherosclerotic plaque within the visualized aortic arch, proximal major branch vessels of the neck and  carotid bifurcations. Aberrant right subclavian artery. Limited intracranial: No acute intracranial abnormality identified. Visualized orbits: Excluded from the field of view. Mastoids and visualized paranasal sinuses: No significant paranasal sinus disease or mastoid effusion at the imaged levels. Skeleton: Cervical spondylosis with multilevel disc space narrowing, posterior disc osteophytes, uncovertebral and facet hypertrophy. No high-grade bony spinal canal stenosis. Upper chest: 6 mm ground-glass nodule within the left lung apex. These results were called by telephone at the time of interpretation on 06/09/2020 at 1:48 pm [facility] verbally acknowledged these results. IMPRESSION: Imaging findings as described and most consistent with pharyngitis and supraglottitis as well as upper cervical esophagitis. There is prominent mucosal/submucosal edema affecting the palatine tonsils, oropharynx, epiglottis and supraglottic larynx. Resultant severe airway effacement at the level of the supraglottic larynx. No abscess or retropharyngeal collection is demonstrated. Mildly prominent right level II lymph node, possibly reactive. 6 mm ground-glass nodule within the left lung apex. Initial follow-up with CT at 6-12 months is recommended to confirm persistence. If persistent, repeat CT is recommended every 2 years until 5 years of stability has been established. This recommendation follows the consensus statement: Guidelines for Management of Incidental Pulmonary Nodules Detected on CT Images: From the Fleischner Society 2017; Radiology 2017; 284:228-243. Incidentally noted aberrant right subclavian artery. Electronically Signed   By: KKellie SimmeringDO   On: 06/08/2020 13:59  CT Chest High Resolution  Result Date: 05/11/2020 CLINICAL DATA:  Cough.  Rheumatoid arthritis. EXAM: CT CHEST WITHOUT CONTRAST TECHNIQUE: Multidetector CT imaging of the chest was performed following the standard protocol  without intravenous contrast. High resolution imaging of the lungs, as well as inspiratory and expiratory imaging, was performed. COMPARISON:  04/13/2020, 06/17/2019. FINDINGS: Cardiovascular: Aberrant right subclavian artery. Atherosclerotic calcification of the aorta, aortic valve and coronary arteries. Ascending aorta measures 4.0 cm. Heart size normal. No pericardial effusion. Mediastinum/Nodes: No pathologically enlarged mediastinal or axillary lymph nodes. Hilar regions are difficult to evaluate without IV contrast. Esophagus is unremarkable. Lungs/Pleura: Minimal biapical pleuroparenchymal scarring. Negative for subpleural reticulation, traction bronchiectasis/bronchiolectasis, ground-glass, architectural distortion or honeycombing. Mild centrilobular emphysema. 7 mm ground-glass nodule in the lateral right lower lobe (10/92) may be slightly more conspicuous than on comparison exams. No solid components. 4 mm ground-glass left upper lobe nodule (10/23), unchanged from 09/11/2006 and considered benign. Calcified granuloma in the left upper lobe. No pleural fluid. Airway is unremarkable. Minimal air trapping at the lung bases. Upper Abdomen: Visualized portions of the liver and right adrenal gland are unremarkable. Thickening of the left adrenal gland. Visualized portions of the kidneys, spleen, pancreas, stomach and bowel are grossly unremarkable with the exception of a tiny hiatal hernia. Cholecystectomy. No upper abdominal adenopathy. Musculoskeletal: Degenerative changes in the spine. No worrisome lytic or sclerotic lesions. IMPRESSION: 1. No evidence of interstitial lung disease. 2. Minimal basilar air trapping, indicative of small airways disease. 3. Right lower lobe ground-glass nodule, slightly more conspicuous than on prior exams. No solid components. Adenocarcinoma cannot be excluded. Follow up by CT is recommended in 12 months, with continued annual surveillance for a minimum of 3 years. These  recommendations are taken from: Recommendations for the Management of Subsolid Pulmonary Nodules Detected at CT: A Statement from the [place] [facility] 2013; 266:1, 304-317. 4. Ascending Aortic aneurysm NOS (ICD10-I71.9). Recommend annual imaging followup by CTA or MRA. This recommendation follows 2010 ACCF/AHA/AATS/ACR/ASA/SCA/SCAI/SIR/STS/SVM Guidelines for the Diagnosis and Management of Patients with Thoracic Aortic Disease. Circulation. 2010; 121: N277-O242. Aortic aneurysm NOS (ICD10-I71.9). 5. Aortic atherosclerosis (ICD10-I70.0). Coronary artery calcification. 6.  Emphysema (ICD10-J43.9). Electronically Signed   By: Lorin Picket M.D.   On: 05/11/2020 16:31   DG Chest Port 1 View  Result Date: 06/06/2020 CLINICAL DATA:  Acute respiratory disease. EXAM: PORTABLE CHEST 1 VIEW COMPARISON:  One-view chest x-ray 06/05/2020 FINDINGS: Heart is enlarged. Atherosclerotic changes are present at the aortic arch. Lung volumes are low. Progressive bibasilar airspace opacities are noted. Facet effusions are suspected. IMPRESSION: 1. Cardiomegaly without failure. 2. Progressive bibasilar airspace disease. Disease progression and new effusions is concerning for pneumonia Electronically Signed   By: San Morelle M.D.   On: 06/06/2020 06:04   DG CHEST PORT 1 VIEW  Result Date: 06/05/2020 CLINICAL DATA:  Respiratory distress EXAM: PORTABLE CHEST 1 VIEW COMPARISON:  06/23/2020 chest radiograph. FINDINGS: Enteric tube enters stomach with the tip not seen on this image. Stable cardiomediastinal silhouette with top-normal heart size. No pneumothorax. No pleural effusion. Patchy opacities at the left greater than right lung bases. No pulmonary edema. IMPRESSION: Patchy opacities at the left greater than right lung bases, suspicious for aspiration or pneumonia. Chest radiograph follow-up advised. Electronically Signed   By: Ilona Sorrel M.D.   On: 06/05/2020 13:24   DG CHEST PORT 1 VIEW  Result Date:  06/08/2020 CLINICAL DATA:  Cough. EXAM: PORTABLE CHEST 1 VIEW COMPARISON:  04/11/2020 FINDINGS: The cardiac silhouette, mediastinal and  hilar contours are stable. Moderate tortuosity and calcification of the thoracic aorta. The lungs are clear of an acute process. No pleural effusions or pulmonary infiltrates. No worrisome pulmonary lesions. The bony thorax is intact. IMPRESSION: No acute cardiopulmonary findings. Electronically Signed   By: Marijo Sanes M.D.   On: 06/15/2020 18:07    Labs:  CBC: Recent Labs    06/03/20 0822 06/04/20 0130 06/05/20 0253 06/06/20 0303  WBC 2.6* 4.2 5.5 6.4  HGB 10.2* 10.4* 10.6* 9.8*  HCT 30.4* 31.2* 32.2* 30.7*  PLT 224 229 198 197    COAGS: No results for input(s): INR, APTT in the last 8760 hours.  BMP: Recent Labs    06/03/20 0822 06/04/20 0130 06/05/20 0253 06/06/20 0303  NA 132* 130* 133* 132*  K 2.7* 2.8* 3.7 4.1  CL 97* 95* 97* 98  CO2 _0 GLUCOSE 122* 168* 153* 196*  BUN _1 CALCIUM 8.1* 8.0* 7.5* 7.0*  CREATININE 0.69 0.63 1.10* 1.03*  GFRNONAA >60 >60 49* 53*  GFRAA >60 >60 56* >60    LIVER FUNCTION TESTS: Recent Labs    11/30/19 1036 11/30/19 1036 02/26/20 1520 02/26/20 1520 03/16/20 1336 04/26/20 1532 05/28/20 1440 06/03/2020 1230  BILITOT 0.7   < > 0.6  --   --  0.5 1.3* 1.4*  AST 19   < > 22  --   --  _2 ALT 20   < > 20  --   --  _3 ALKPHOS 58  --   --   --   --   --  67 59  PROT 5.8*   < > 6.0*   < > 6.4 6.7 7.3 6.6  ALBUMIN 3.5  --   --   --   --   --  3.6 3.0*   < > = values in this interval not displayed.    TUMOR MARKERS: No results for input(s): AFPTM, CEA, CA199, CHROMGRNA in the last 8760 hours.  Assessment and Plan:  Monocytosis Odynophagia; sore throat Confusion Fever Scheduled for bone marrow biopsy in IR 8/10 per Dr Alen Blew request Risks and benefits of bone marrow biopsy was discussed with daughter Levada Dy via phone  including, but not limited to bleeding,  infection, damage to adjacent structures or low yield requiring additional tests.  All of the questions were answered and there is agreement to proceed. Consent signed and in chart.   Thank you for this interesting consult.  I greatly enjoyed meeting TIJAH HANE and look forward to participating in their care.  A copy of this report was sent to the requesting provider on this date.  Electronically Signed: Lavonia Drafts, PA-C 06/06/2020, 9:00 AM   I spent a total of 20 Minutes    in face to face in clinical consultation, greater than 50% of which was counseling/coordinating care for bone marrow biopsy

## 2020-06-06 NOTE — Progress Notes (Signed)
Pt with worsening respiratory distress and work of breathing.  D/w pt and her daughter at bedside.  Will proceed with intubation.  Will add levophed for blood pressure support.  Will need to arrange for central line placement.  Additional CC time 28 minutes.  Chesley Mires, MD [place] Pager - [phone] 06/06/2020, 6:24 PM

## 2020-06-06 NOTE — Progress Notes (Addendum)
@   around [place] NP/MD regarding pt's current status, Pt. Was given IV Metroprolol twice(see MAR) d/t sustained heart rate of >120 with effectiveness for short period of time, Noted rapid and labored breathing and audible gurgling sounds in the mouth/throat, noted thick white secretions during oral suctioning. HOB constantly in high Fowler's, promoted oral care and repositioning, however were ineffective. Pt appers very weak and lethargic, still able to answer questions (name, place) with unclear words d/t pain and mouth dryness. Charge nurse notified and rapid response was called. Pt. Was given Tylenol suppository d/t a fever T 101.5 (Ax) and new order for breathing tx initiated as well. Will monitor closely.

## 2020-06-06 NOTE — Progress Notes (Signed)
Pharmacy Antibiotic Note  Katherine Wang is a 74 y.o. female admitted on 06/04/2020 with pneumonia.  Pharmacy has been consulted for vancomycin and cefepime dosing.  Plan: Cefepime 2gm IV q12 hours Vancomycin 1500 mg IV X 1 then 1gm IV q24 hours F/u renal function, cultures and clinical course  Height: 5' (152.4 cm) Weight: 74.1 kg (163 lb 5.8 oz) IBW/kg (Calculated) : 45.5  Temp (24hrs), Avg:99 F (37.2 C), Min:97.2 F (36.2 C), Max:101.5 F (38.6 C)  Recent Labs  Lab 06/09/2020 1225 05/30/2020 1230 06/01/20 0525 06/01/20 0525 06/02/20 0324 06/03/20 0822 06/04/20 0130 06/05/20 0253 06/06/20 0303  WBC  --    < > 1.9*  1.9*   < > 2.9* 2.6* 4.2 5.5 6.4  CREATININE  --    < > 0.70  --   --  0.69 0.63 1.10* 1.03*  LATICACIDVEN 1.1  --   --   --   --   --   --   --   --    < > = values in this interval not displayed.    Estimated Creatinine Clearance: 41.7 mL/min (A) (by C-G formula based on SCr of 1.03 mg/dL (H)).    Allergies  Allergen Reactions  . Tramadol Other (See Comments)    dizzy  . Statins     Body ache    Thank you for allowing pharmacy to be a part of this patient's care.  Beverlee Nims 06/06/2020 6:36 AM

## 2020-06-06 NOTE — Progress Notes (Signed)
ABG collected, sent to lab.  °

## 2020-06-07 ENCOUNTER — Ambulatory Visit: Payer: Medicare Other | Admitting: Physician Assistant

## 2020-06-07 ENCOUNTER — Telehealth: Payer: Self-pay | Admitting: Rheumatology

## 2020-06-07 ENCOUNTER — Inpatient Hospital Stay (HOSPITAL_COMMUNITY): Payer: Medicare Other

## 2020-06-07 DIAGNOSIS — R0603 Acute respiratory distress: Secondary | ICD-10-CM | POA: Diagnosis not present

## 2020-06-07 LAB — GLUCOSE, CAPILLARY
Glucose-Capillary: 132 mg/dL — ABNORMAL HIGH (ref 70–99)
Glucose-Capillary: 150 mg/dL — ABNORMAL HIGH (ref 70–99)
Glucose-Capillary: 166 mg/dL — ABNORMAL HIGH (ref 70–99)
Glucose-Capillary: 227 mg/dL — ABNORMAL HIGH (ref 70–99)
Glucose-Capillary: 239 mg/dL — ABNORMAL HIGH (ref 70–99)
Glucose-Capillary: 250 mg/dL — ABNORMAL HIGH (ref 70–99)

## 2020-06-07 LAB — BASIC METABOLIC PANEL
Anion gap: 10 (ref 5–15)
BUN: 31 mg/dL — ABNORMAL HIGH (ref 8–23)
CO2: 20 mmol/L — ABNORMAL LOW (ref 22–32)
Calcium: 6.6 mg/dL — ABNORMAL LOW (ref 8.9–10.3)
Chloride: 101 mmol/L (ref 98–111)
Creatinine, Ser: 1.37 mg/dL — ABNORMAL HIGH (ref 0.44–1.00)
GFR calc Af Amer: 43 mL/min — ABNORMAL LOW (ref >60)
GFR calc non Af Amer: 37 mL/min — ABNORMAL LOW (ref >60)
Glucose, Bld: 261 mg/dL — ABNORMAL HIGH (ref 70–99)
Potassium: 4.3 mmol/L (ref 3.5–5.1)
Sodium: 131 mmol/L — ABNORMAL LOW (ref 135–145)

## 2020-06-07 LAB — CBC WITH DIFFERENTIAL/PLATELET
Abs Immature Granulocytes: 0.53 10*3/uL — ABNORMAL HIGH (ref 0.00–0.07)
Basophils Absolute: 0 10*3/uL (ref 0.0–0.1)
Basophils Relative: 1 %
Eosinophils Absolute: 0 10*3/uL (ref 0.0–0.5)
Eosinophils Relative: 0 %
HCT: 27.4 % — ABNORMAL LOW (ref 36.0–46.0)
Hemoglobin: 8.9 g/dL — ABNORMAL LOW (ref 12.0–15.0)
Immature Granulocytes: 9 %
Lymphocytes Relative: 8 %
Lymphs Abs: 0.5 10*3/uL — ABNORMAL LOW (ref 0.7–4.0)
MCH: 33 pg (ref 26.0–34.0)
MCHC: 32.5 g/dL (ref 30.0–36.0)
MCV: 101.5 fL — ABNORMAL HIGH (ref 80.0–100.0)
Monocytes Absolute: 2.7 10*3/uL — ABNORMAL HIGH (ref 0.1–1.0)
Monocytes Relative: 41 %
Neutro Abs: 2.5 10*3/uL (ref 1.7–7.7)
Neutrophils Relative %: 41 %
Platelets: 186 10*3/uL (ref 150–400)
RBC: 2.7 MIL/uL — ABNORMAL LOW (ref 3.87–5.11)
RDW: 15.8 % — ABNORMAL HIGH (ref 11.5–15.5)
WBC: 6.3 10*3/uL (ref 4.0–10.5)
nRBC: 0.5 % — ABNORMAL HIGH (ref 0.0–0.2)

## 2020-06-07 LAB — PROCALCITONIN: Procalcitonin: 1.32 ng/mL

## 2020-06-07 LAB — MRSA PCR SCREENING: MRSA by PCR: NEGATIVE

## 2020-06-07 LAB — PHOSPHORUS: Phosphorus: 1.9 mg/dL — ABNORMAL LOW (ref 2.5–4.6)

## 2020-06-07 LAB — MAGNESIUM: Magnesium: 1.8 mg/dL (ref 1.7–2.4)

## 2020-06-07 LAB — TRIGLYCERIDES: Triglycerides: 377 mg/dL — ABNORMAL HIGH (ref <150)

## 2020-06-07 LAB — STREP PNEUMONIAE URINARY ANTIGEN: Strep Pneumo Urinary Antigen: NEGATIVE

## 2020-06-07 MED ORDER — QUETIAPINE FUMARATE 50 MG PO TABS
50.0000 mg | ORAL_TABLET | Freq: Two times a day (BID) | ORAL | Status: DC
  Administered 2020-06-07 – 2020-06-10 (×7): 50 mg
  Filled 2020-06-07 (×7): qty 1

## 2020-06-07 MED ORDER — MIDAZOLAM HCL 2 MG/2ML IJ SOLN
1.0000 mg | INTRAMUSCULAR | Status: DC | PRN
  Administered 2020-06-07 – 2020-06-08 (×5): 2 mg via INTRAVENOUS
  Filled 2020-06-07 (×5): qty 2

## 2020-06-07 MED ORDER — INSULIN ASPART 100 UNIT/ML ~~LOC~~ SOLN
0.0000 [IU] | Freq: Three times a day (TID) | SUBCUTANEOUS | Status: DC
Start: 2020-06-07 — End: 2020-06-07

## 2020-06-07 MED ORDER — DOCUSATE SODIUM 50 MG/5ML PO LIQD
100.0000 mg | Freq: Two times a day (BID) | ORAL | Status: DC
  Administered 2020-06-07 – 2020-06-12 (×11): 100 mg
  Filled 2020-06-07 (×10): qty 10

## 2020-06-07 MED ORDER — HYDROMORPHONE HCL 1 MG/ML IJ SOLN
0.5000 mg | INTRAMUSCULAR | Status: DC | PRN
  Administered 2020-06-07 – 2020-06-10 (×12): 0.5 mg via INTRAVENOUS
  Filled 2020-06-07 (×12): qty 0.5

## 2020-06-07 MED ORDER — VITAL AF 1.2 CAL PO LIQD
1000.0000 mL | ORAL | Status: DC
  Administered 2020-06-07 – 2020-06-10 (×5): 1000 mL

## 2020-06-07 MED ORDER — INSULIN ASPART 100 UNIT/ML ~~LOC~~ SOLN
0.0000 [IU] | SUBCUTANEOUS | Status: DC
  Administered 2020-06-07: 5 [IU] via SUBCUTANEOUS
  Administered 2020-06-07: 3 [IU] via SUBCUTANEOUS
  Administered 2020-06-07: 2 [IU] via SUBCUTANEOUS
  Administered 2020-06-08: 5 [IU] via SUBCUTANEOUS
  Administered 2020-06-08 (×2): 2 [IU] via SUBCUTANEOUS
  Administered 2020-06-08: 3 [IU] via SUBCUTANEOUS
  Administered 2020-06-08: 5 [IU] via SUBCUTANEOUS
  Administered 2020-06-08: 3 [IU] via SUBCUTANEOUS
  Administered 2020-06-08: 2 [IU] via SUBCUTANEOUS
  Administered 2020-06-09 – 2020-06-10 (×3): 3 [IU] via SUBCUTANEOUS
  Administered 2020-06-10: 5 [IU] via SUBCUTANEOUS
  Administered 2020-06-10 (×4): 3 [IU] via SUBCUTANEOUS
  Administered 2020-06-11: 2 [IU] via SUBCUTANEOUS
  Administered 2020-06-11: 3 [IU] via SUBCUTANEOUS
  Administered 2020-06-12 (×2): 2 [IU] via SUBCUTANEOUS

## 2020-06-07 MED ORDER — MAGIC MOUTHWASH
5.0000 mL | Freq: Four times a day (QID) | ORAL | Status: DC | PRN
  Administered 2020-06-07 – 2020-06-11 (×4): 5 mL via ORAL
  Filled 2020-06-07 (×5): qty 5

## 2020-06-07 MED ORDER — INSULIN ASPART 100 UNIT/ML ~~LOC~~ SOLN
0.0000 [IU] | SUBCUTANEOUS | Status: DC
  Administered 2020-06-07: 5 [IU] via SUBCUTANEOUS

## 2020-06-07 MED ORDER — POLYETHYLENE GLYCOL 3350 17 G PO PACK
17.0000 g | PACK | Freq: Every day | ORAL | Status: DC
  Administered 2020-06-07 – 2020-06-12 (×6): 17 g
  Filled 2020-06-07 (×5): qty 1

## 2020-06-07 MED ORDER — SODIUM PHOSPHATES 45 MMOLE/15ML IV SOLN
30.0000 mmol | Freq: Once | INTRAVENOUS | Status: AC
  Administered 2020-06-07: 30 mmol via INTRAVENOUS
  Filled 2020-06-07: qty 10

## 2020-06-07 MED ORDER — MAGNESIUM SULFATE 2 GM/50ML IV SOLN
2.0000 g | Freq: Once | INTRAVENOUS | Status: AC
  Administered 2020-06-07: 2 g via INTRAVENOUS
  Filled 2020-06-07: qty 50

## 2020-06-07 MED ORDER — DEXMEDETOMIDINE HCL IN NACL 400 MCG/100ML IV SOLN
0.4000 ug/kg/h | INTRAVENOUS | Status: DC
  Administered 2020-06-07: 0.4 ug/kg/h via INTRAVENOUS
  Administered 2020-06-07 (×2): 1.2 ug/kg/h via INTRAVENOUS
  Administered 2020-06-08: 0.8 ug/kg/h via INTRAVENOUS
  Administered 2020-06-08 (×3): 1.2 ug/kg/h via INTRAVENOUS
  Administered 2020-06-08: 0.8 ug/kg/h via INTRAVENOUS
  Administered 2020-06-09 (×2): 1 ug/kg/h via INTRAVENOUS
  Administered 2020-06-09 (×3): 1.2 ug/kg/h via INTRAVENOUS
  Administered 2020-06-10 (×2): 1.4 ug/kg/h via INTRAVENOUS
  Administered 2020-06-10 (×2): 1.2 ug/kg/h via INTRAVENOUS
  Administered 2020-06-10: 1.4 ug/kg/h via INTRAVENOUS
  Administered 2020-06-10: 1.2 ug/kg/h via INTRAVENOUS
  Administered 2020-06-10: 1.6 ug/kg/h via INTRAVENOUS
  Administered 2020-06-11: 2 ug/kg/h via INTRAVENOUS
  Administered 2020-06-11: 1.8 ug/kg/h via INTRAVENOUS
  Administered 2020-06-11 (×2): 2 ug/kg/h via INTRAVENOUS
  Administered 2020-06-11 (×4): 1.8 ug/kg/h via INTRAVENOUS
  Administered 2020-06-12 (×5): 2 ug/kg/h via INTRAVENOUS
  Filled 2020-06-07 (×2): qty 100
  Filled 2020-06-07: qty 200
  Filled 2020-06-07 (×9): qty 100
  Filled 2020-06-07: qty 200
  Filled 2020-06-07 (×16): qty 100
  Filled 2020-06-07: qty 1400
  Filled 2020-06-07 (×2): qty 100

## 2020-06-07 NOTE — Progress Notes (Signed)
Events overnight noted.  Patient developed worsening respiratory status and required intubation.  Laboratory data reviewed showed no major changes in her hematological parameters.  Given these findings, bone marrow biopsy can be deferred till her clinical status improves.  I do not feel that obtaining a bone marrow biopsy right now is urgent.  I do not believe that there is an underlying hematological condition that is contributing to her clinical deterioration.  We will continue to follow.

## 2020-06-07 NOTE — Progress Notes (Signed)
NAME:  Katherine Wang, MRN:  [id number], DOB:  1946/04/05, LOS: 7 ADMISSION DATE:  06/15/2020, CONSULTATION DATE:  06/06/2020 REFERRING MD:  Carolyn Stare, CHIEF COMPLAINT:  Sepsis   Brief History   74 year old female with rheumatoid arthritis on biologic therapy for esophagitis presented with weakness and sore throat associated with cough and fever and odynophagia. Complicated by poor secretion clearance, dyspnea and hypotension concerning for sepsis. Found to have leukopenia on broad spectrum antimicrobial therapy.  PCCM consulted.    Past Medical History   Past Medical History:  Diagnosis Date   Adenomatous colon polyp 2012   Allergy    Anxiety    Aortic insufficiency    Arthritis    Basal cell carcinoma    Cataract    Depression    Diverticulosis    Fibromyalgia    Fibromyalgia    Hyperlipidemia    Hypertension    Macular degeneration    Melanoma ([place])    Murmur, heart    Reflux    Stroke ([place]) 20 years ago   per pt. mild stroke   Thyroid disease    hypothroidism      Significant Hospital Events   8/3 admitted 8/9 PCCM consulted  Consults:  PCCM Heme/onc IR  Procedures:  None  Significant Diagnostic Tests:  CT soft tissue neck 8/3 > Imaging findings as described and most consistent with pharyngitis and supraglottitis as well as upper cervical esophagitis. There is prominent mucosal/submucosal edema affecting the palatine tonsils, oropharynx, epiglottis and supraglottic larynx. Resultant severe airway effacement at the level of the supraglottic larynx. No abscess or retropharyngeal collection is demonstrated. Mildly prominent right level II lymph node, possibly reactive. 6 mm ground-glass nodule within the left lung apex. Initial follow-up with CT at 6-12 months is recommended to confirm persistence.  Micro Data:  COVID 8/3 neg GAS 8/3 neg Blood cx 8/3 neg Tracheal aspirate 8/9 > few gram - rods, moderate gram variable rods  Antimicrobials:    Acyclovir 8/3 >  Cefepime 8/9>  Diflucan 8/6> Vancomycin 8/9  Interim history/subjective:  Patient noted to have worsening respiratory distress yesterday evening requiring intubation   Objective   Blood pressure 104/63, pulse 92, temperature (!) 97.5 F (36.4 C), temperature source Axillary, resp. rate (!) 23, height 5\' 2"  (1.575 m), weight 77.4 kg, SpO2 100 %.    Vent Mode: PSV;CPAP FiO2 (%):  [40 %-100 %] 40 % Set Rate:  [16 bmp] 16 bmp Vt Set:  [400 mL] 400 mL PEEP:  [5 cmH20] 5 cmH20 Pressure Support:  [12 cmH20] 12 cmH20 Plateau Pressure:  [16 cmH20-17 cmH20] 16 cmH20   Intake/Output Summary (Last 24 hours) at 06/07/2020 1238 Last data filed at 06/07/2020 8127 Gross per 24 hour  Intake 3168.55 ml  Output 435 ml  Net 2733.55 ml   Filed Weights   06/04/20 0500 06/06/20 0500 06/07/20 0500  Weight: 74 kg 74.1 kg 77.4 kg    Examination: General: critically ill appearing female, no acute distress, sedated and mechanically ventilated  HENT: [place]/AT, mucositis and lesions noted at the tip of tongue, ET tube in place Lungs: clear to auscultation bilaterally Cardiovascular: RRR, S1 and S2 present, no m/r/g Abdomen: soft, nondistended, nontender, normoactive bowel sounds Extremities: distal pulses intact; warm and dry  Neuro: heavily sedated, PERRL  Assessment & Plan:  Severe sepsis and acute hypoxic respiratory failure suspect secondary to aspiration pneumonia:  - Continue empiric antibiotics - F/u blood and sputum cultures - Levophed for MAP >65  Mucositis/esophagitis/ supraglottitis:  - presumed to be viral, EBV IgG positive  - Continue acyclovir  - ENT and heme/onc following - Will consider ID consult  Blood dyscrasia:  Patient was recommended for BM biopsy by heme/onc for this. Currently holding off as she is intubated.  - CBC monitoring - F/u heme/onc recommendations   Acute renal failure:  Worsening renal function with decreasing urine output  Electrolytes  wnl; suspect secondary to sepsis  - Continue to monitor   Hyperglycemia:  Likely in setting of acute illness; currently on Novolog 4U q4h - Add SSI moderate  - Goal CBG <180  Hypertriglyceridemia:  Acute elevation of triglycerides this AM in setting propofol use - Switch to precedex gtt for RASS goal of -2   Hyponatremia: - Will discontinue free water replacement at this time   Hypothyroidism:  Continue synthroid   Best practice:  Diet: tube feeds Pain/Anxiety/Delirium protocol (if indicated): per protocol VAP protocol (if indicated): per protocol DVT prophylaxis: Lovenox GI prophylaxis: Famotidine Glucose control: Novolog + SSI Mobility: bed rest Code Status: FULL  Family Communication: patient's daughter, Katherine Wang, updated via phone 8/10 Disposition: ICU    Critical care time: 40 minutes

## 2020-06-07 NOTE — Progress Notes (Signed)
    Pt now intubated and unstable See Dr Alen Blew note Will HOLD Bone marrow biopsy for now   PLEASE let IR know when you feel pt is appropriate for procedure Can be performed as OP also.Marland KitchenMarland Kitchen

## 2020-06-07 NOTE — Telephone Encounter (Signed)
FYI: Patient's daughter just called to cancel patient's appointment today. Per daughter, patient is now on a ventilator.

## 2020-06-07 NOTE — Progress Notes (Signed)
Nutrition Follow-up  DOCUMENTATION CODES:   Obesity unspecified  INTERVENTION:   Continue tube feeding via Cortrak: To better meet re-estimated needs, change to Vital AF 1.2 at 60 ml/h (1440 ml per day)  Provides 1728 kcal, 108 gm protein, 1168 ml free water daily  NUTRITION DIAGNOSIS:   Inadequate oral intake related to inability to eat as evidenced by NPO status.  Ongoing   GOAL:   Patient will meet greater than or equal to 90% of their needs  Met with TF  MONITOR:   Diet advancement, Labs, Weight trends, Skin, I & O's, TF tolerance  REASON FOR ASSESSMENT:   Ventilator Enteral/tube feeding initiation and management  ASSESSMENT:   Katherine Wang is a 75 y.o. female with medical history significant of hypertension, hyperlipidemia, hypothyroidism, anxiety, depression, and rheumatoid arthritis presents with complaints of worsening sore throat over the last 2-3 weeks.  Patient was transferred to the ICU for respiratory distress and intubated 8/9.  Currently receiving TF via Cortrak tube (placed 8/6, tip in the stomach): Osmolite 1.2 at 60 ml/h is providing 1728 kcal, 80 gm protein, 1181 ml free water daily. Patient has been tolerating this well.   Patient is currently intubated on ventilator support MV: 11 L/min Temp (24hrs), Avg:99.3 F (37.4 C), Min:97.5 F (36.4 C), Max:101.5 F (38.6 C)  Propofol has been stopped.  Labs reviewed. Sodium 131, BUN 31, creat 1.37, phos 1.9, triglycerides 377 CBG: 239-250-227  Medications reviewed and include colace, novolog, miralax, precedex, levophed, sodium phosphate.  Usual weights reviewed and have been stable for the past 3 months. Weight is up since admission due to positive fluid status. I/O +2.6 L x 24 hours.  Diet Order:   Diet Order            Diet NPO time specified  Diet effective midnight                 EDUCATION NEEDS:   Education needs have been addressed  Skin:  Skin Assessment: Reviewed RN  Assessment  Last BM:  05/30/20  Height:   Ht Readings from Last 1 Encounters:  06/06/20 5' 2"  (1.575 m)    Weight:   Wt Readings from Last 1 Encounters:  06/07/20 77.4 kg    Ideal Body Weight:  45.5 kg  BMI:  Body mass index is 31.21 kg/m.  Estimated Nutritional Needs:   Kcal:  1670  Protein:  100-110 gm  Fluid:  > 1.6 L    Lucas Mallow, RD, LDN, CNSC Please refer to Amion for contact information.

## 2020-06-07 NOTE — Progress Notes (Signed)
[facility] Progress Note Patient Name: Katherine Wang DOB: 1946/07/30 MRN: [id number]   Date of Service  06/07/2020  HPI/Events of Note  Mouth pain - No relief with Fentanyl IV.   eICU Interventions  Plan: 1. D/C Fentanyl IV PRN. 2. Dilaudid 0.5 mg iV Q 2 hours PRN pain. 3. Magic mouthwash 5 mL swish and spit QID PRN.         Sherle Mello Cornelia Copa 06/07/2020, 9:45 PM

## 2020-06-08 ENCOUNTER — Inpatient Hospital Stay (HOSPITAL_COMMUNITY): Payer: Medicare Other

## 2020-06-08 DIAGNOSIS — J9601 Acute respiratory failure with hypoxia: Secondary | ICD-10-CM | POA: Diagnosis not present

## 2020-06-08 LAB — CBC WITH DIFFERENTIAL/PLATELET
Abs Immature Granulocytes: 0.54 10*3/uL — ABNORMAL HIGH (ref 0.00–0.07)
Basophils Absolute: 0 10*3/uL (ref 0.0–0.1)
Basophils Relative: 0 %
Eosinophils Absolute: 0 10*3/uL (ref 0.0–0.5)
Eosinophils Relative: 0 %
HCT: 26.5 % — ABNORMAL LOW (ref 36.0–46.0)
Hemoglobin: 8.7 g/dL — ABNORMAL LOW (ref 12.0–15.0)
Immature Granulocytes: 11 %
Lymphocytes Relative: 11 %
Lymphs Abs: 0.6 10*3/uL — ABNORMAL LOW (ref 0.7–4.0)
MCH: 32.8 pg (ref 26.0–34.0)
MCHC: 32.8 g/dL (ref 30.0–36.0)
MCV: 100 fL (ref 80.0–100.0)
Monocytes Absolute: 2.7 10*3/uL — ABNORMAL HIGH (ref 0.1–1.0)
Monocytes Relative: 57 %
Neutro Abs: 1 10*3/uL — ABNORMAL LOW (ref 1.7–7.7)
Neutrophils Relative %: 21 %
Platelets: 121 10*3/uL — ABNORMAL LOW (ref 150–400)
RBC: 2.65 MIL/uL — ABNORMAL LOW (ref 3.87–5.11)
RDW: 15.5 % (ref 11.5–15.5)
WBC: 4.9 10*3/uL (ref 4.0–10.5)
nRBC: 0 % (ref 0.0–0.2)

## 2020-06-08 LAB — GLUCOSE, CAPILLARY
Glucose-Capillary: 128 mg/dL — ABNORMAL HIGH (ref 70–99)
Glucose-Capillary: 212 mg/dL — ABNORMAL HIGH (ref 70–99)
Glucose-Capillary: 137 mg/dL — ABNORMAL HIGH (ref 70–99)
Glucose-Capillary: 166 mg/dL — ABNORMAL HIGH (ref 70–99)
Glucose-Capillary: 214 mg/dL — ABNORMAL HIGH (ref 70–99)
Glucose-Capillary: 157 mg/dL — ABNORMAL HIGH (ref 70–99)

## 2020-06-08 LAB — BASIC METABOLIC PANEL
Anion gap: 9 (ref 5–15)
BUN: 31 mg/dL — ABNORMAL HIGH (ref 8–23)
CO2: 18 mmol/L — ABNORMAL LOW (ref 22–32)
Calcium: 6.4 mg/dL — CL (ref 8.9–10.3)
Chloride: 103 mmol/L (ref 98–111)
Creatinine, Ser: 1.16 mg/dL — ABNORMAL HIGH (ref 0.44–1.00)
GFR calc Af Amer: 53 mL/min — ABNORMAL LOW (ref >60)
GFR calc non Af Amer: 46 mL/min — ABNORMAL LOW (ref >60)
Glucose, Bld: 142 mg/dL — ABNORMAL HIGH (ref 70–99)
Potassium: 3.7 mmol/L (ref 3.5–5.1)
Sodium: 130 mmol/L — ABNORMAL LOW (ref 135–145)

## 2020-06-08 LAB — MAGNESIUM: Magnesium: 2 mg/dL (ref 1.7–2.4)

## 2020-06-08 LAB — PHOSPHORUS: Phosphorus: 2.8 mg/dL (ref 2.5–4.6)

## 2020-06-08 LAB — ALBUMIN: Albumin: 1.2 g/dL — ABNORMAL LOW (ref 3.5–5.0)

## 2020-06-08 LAB — CULTURE, RESPIRATORY W GRAM STAIN: Culture: NORMAL

## 2020-06-08 LAB — PROCALCITONIN: Procalcitonin: 1.29 ng/mL

## 2020-06-08 LAB — TRIGLYCERIDES: Triglycerides: 527 mg/dL — ABNORMAL HIGH (ref <150)

## 2020-06-08 MED ORDER — MIDAZOLAM HCL 2 MG/2ML IJ SOLN
2.0000 mg | INTRAMUSCULAR | Status: DC | PRN
  Administered 2020-06-08 – 2020-06-09 (×4): 2 mg via INTRAVENOUS
  Administered 2020-06-10 (×2): 4 mg via INTRAVENOUS
  Administered 2020-06-10: 2 mg via INTRAVENOUS
  Administered 2020-06-10: 4 mg via INTRAVENOUS
  Administered 2020-06-11 (×2): 2 mg via INTRAVENOUS
  Administered 2020-06-11: 4 mg via INTRAVENOUS
  Administered 2020-06-11 (×2): 2 mg via INTRAVENOUS
  Administered 2020-06-12: 4 mg via INTRAVENOUS
  Filled 2020-06-08: qty 4
  Filled 2020-06-08: qty 2
  Filled 2020-06-08 (×2): qty 4
  Filled 2020-06-08: qty 2
  Filled 2020-06-08 (×2): qty 4
  Filled 2020-06-08: qty 2
  Filled 2020-06-08 (×2): qty 4
  Filled 2020-06-08 (×2): qty 2
  Filled 2020-06-08: qty 4

## 2020-06-08 NOTE — Progress Notes (Signed)
NAME:  Katherine Wang, MRN:  [id number], DOB:  08-20-1946, LOS: 8 ADMISSION DATE:  06/09/2020, CONSULTATION DATE:  06/06/2020 REFERRING MD:  Carolyn Stare, CHIEF COMPLAINT:  Sepsis   Brief History   74 year old female with rheumatoid arthritis on biologic therapy for esophagitis presented with weakness and sore throat associated with cough and fever and odynophagia. Complicated by poor secretion clearance, dyspnea and hypotension concerning for sepsis. Found to have leukopenia on broad spectrum antimicrobial therapy.  PCCM consulted.    Past Medical History   Past Medical History:  Diagnosis Date  . Adenomatous colon polyp 2012  . Allergy   . Anxiety   . Aortic insufficiency   . Arthritis   . Basal cell carcinoma   . Cataract   . Depression   . Diverticulosis   . Fibromyalgia   . Fibromyalgia   . Hyperlipidemia   . Hypertension   . Macular degeneration   . Melanoma ([place])   . Murmur, heart   . Reflux   . Stroke ([place]) 20 years ago   per pt. mild stroke  . Thyroid disease    hypothroidism    Significant Hospital Events   8/3 admitted 8/9 PCCM consulted  Consults:  PCCM Heme/onc IR  Procedures:  None  Significant Diagnostic Tests:  CT soft tissue neck 8/3 > Imaging findings as described and most consistent with pharyngitis and supraglottitis as well as upper cervical esophagitis. There is prominent mucosal/submucosal edema affecting the palatine tonsils, oropharynx, epiglottis and supraglottic larynx. Resultant severe airway effacement at the level of the supraglottic larynx. No abscess or retropharyngeal collection is demonstrated. Mildly prominent right level II lymph node, possibly reactive. 6 mm ground-glass nodule within the left lung apex. Initial follow-up with CT at 6-12 months is recommended to confirm persistence.  Micro Data:  COVID 8/3 neg GAS 8/3 neg Blood cx 8/3 neg Blood cx 8/9 > neg to date  Tracheal aspirate 8/9 > normal resp flora  MRSA 8/9 > negative    Antimicrobials:  Acyclovir 8/3 >  Cefepime 8/9>  Diflucan 8/6> Vancomycin 8/9>8/11   Interim history/subjective:  Overnight, Tmax 103.3, hypotensive, tachycardic and tachypneic managed with tylenol and cooling blanket with improvement. Also required increase in pressor support. Remains heavily sedated and mechanically ventilated.  Objective   Blood pressure 97/60, pulse 98, temperature 99.6 F (37.6 C), temperature source Rectal, resp. rate (!) 29, height 5\' 2"  (1.575 m), weight 82.8 kg, SpO2 99 %.    Vent Mode: PRVC FiO2 (%):  [40 %] 40 % Set Rate:  [16 bmp] 16 bmp Vt Set:  [400 mL] 400 mL PEEP:  [5 cmH20] 5 cmH20 Pressure Support:  [12 cmH20] 12 cmH20 Plateau Pressure:  [15 GDJ24-26 cmH20] 24 cmH20   Intake/Output Summary (Last 24 hours) at 06/08/2020 8341 Last data filed at 06/08/2020 0700 Gross per 24 hour  Intake 4334.75 ml  Output 589 ml  Net 3745.75 ml   Filed Weights   06/06/20 0500 06/07/20 0500 06/08/20 0347  Weight: 74.1 kg 77.4 kg 82.8 kg    Examination: General: critically ill appearing female, no acute distress, sedated and mechanically ventilated  HENT: [place]/AT, mucositis and tongue lesions with improved appearance, ET tube in place Lungs: clear to auscultation bilaterally Cardiovascular: RRR, S1 and S2 present, no m/r/g Abdomen: soft, nondistended, nontender, normoactive bowel sounds Extremities: distal pulses intact; warm and dry  Neuro: heavily sedated, RASS -3,  PERRL  Assessment & Plan:  Severe sepsis and acute hypoxic respiratory failure suspect  secondary to aspiration pneumonia:  Sputum cultures negative for staph or pseudomonas; blood cultures negative to date  - Continue empiric abx coverage with cefepime day 3/7 - Continue full mechanical ventilator support, TV 4-8cc/kg, SpO2 >90% - Levophed for MAP >65  Mucositis/esophagitis/ supraglottitis:  Improved today;  presumed to be viral, EBV IgG + HSV positive  - Continue acyclovir + diflucan    Blood dyscrasia:  Patient was recommended for BM biopsy by heme/onc for this. Currently holding off as she is intubated.  - CBC monitoring - F/u heme/onc recommendations   Acute renal failure:  Renal function improved this morning.  Electrolytes wnl; suspect secondary to sepsis  - Continue to monitor   Hyperglycemia:  Likely in setting of acute illness; currently on Novolog 4U q4h + SSI - Goal CBG <180  Hypothyroidism:  Continue synthroid   Best practice:  Diet: tube feeds Pain/Anxiety/Delirium protocol (if indicated): per protocol VAP protocol (if indicated): per protocol DVT prophylaxis: Lovenox GI prophylaxis: Famotidine Glucose control: Novolog + SSI Mobility: bed rest Code Status: FULL  Family Communication: daily updates to patient's daughter, Elnoria Howard Disposition: ICU    Critical care time: 40 minutes

## 2020-06-09 ENCOUNTER — Inpatient Hospital Stay (HOSPITAL_COMMUNITY): Payer: Medicare Other

## 2020-06-09 ENCOUNTER — Ambulatory Visit (INDEPENDENT_AMBULATORY_CARE_PROVIDER_SITE_OTHER): Payer: Medicare Other | Admitting: Otolaryngology

## 2020-06-09 DIAGNOSIS — J9601 Acute respiratory failure with hypoxia: Secondary | ICD-10-CM | POA: Diagnosis not present

## 2020-06-09 LAB — POCT I-STAT 7, (LYTES, BLD GAS, ICA,H+H)
Acid-Base Excess: 0 mmol/L (ref 0.0–2.0)
Bicarbonate: 22.9 mmol/L (ref 20.0–28.0)
Calcium, Ion: 0.94 mmol/L — ABNORMAL LOW (ref 1.15–1.40)
HCT: 34 % — ABNORMAL LOW (ref 36.0–46.0)
Hemoglobin: 11.6 g/dL — ABNORMAL LOW (ref 12.0–15.0)
O2 Saturation: 98 %
Patient temperature: 101.2
Potassium: 3.9 mmol/L (ref 3.5–5.1)
Sodium: 132 mmol/L — ABNORMAL LOW (ref 135–145)
TCO2: 24 mmol/L (ref 22–32)
pCO2 arterial: 33.1 mmHg (ref 32.0–48.0)
pH, Arterial: 7.454 — ABNORMAL HIGH (ref 7.350–7.450)
pO2, Arterial: 106 mmHg (ref 83.0–108.0)

## 2020-06-09 LAB — BASIC METABOLIC PANEL
Anion gap: 8 (ref 5–15)
BUN: 32 mg/dL — ABNORMAL HIGH (ref 8–23)
CO2: 21 mmol/L — ABNORMAL LOW (ref 22–32)
Calcium: 6.4 mg/dL — CL (ref 8.9–10.3)
Chloride: 102 mmol/L (ref 98–111)
Creatinine, Ser: 1.14 mg/dL — ABNORMAL HIGH (ref 0.44–1.00)
GFR calc Af Amer: 54 mL/min — ABNORMAL LOW (ref >60)
GFR calc non Af Amer: 47 mL/min — ABNORMAL LOW (ref >60)
Glucose, Bld: 114 mg/dL — ABNORMAL HIGH (ref 70–99)
Potassium: 3.8 mmol/L (ref 3.5–5.1)
Sodium: 131 mmol/L — ABNORMAL LOW (ref 135–145)

## 2020-06-09 LAB — CBC WITH DIFFERENTIAL/PLATELET
Abs Immature Granulocytes: 0.82 10*3/uL — ABNORMAL HIGH (ref 0.00–0.07)
Basophils Absolute: 0 10*3/uL (ref 0.0–0.1)
Basophils Relative: 0 %
Eosinophils Absolute: 0 10*3/uL (ref 0.0–0.5)
Eosinophils Relative: 0 %
HCT: 25.5 % — ABNORMAL LOW (ref 36.0–46.0)
Hemoglobin: 8.2 g/dL — ABNORMAL LOW (ref 12.0–15.0)
Immature Granulocytes: 12 %
Lymphocytes Relative: 8 %
Lymphs Abs: 0.6 10*3/uL — ABNORMAL LOW (ref 0.7–4.0)
MCH: 32.2 pg (ref 26.0–34.0)
MCHC: 32.2 g/dL (ref 30.0–36.0)
MCV: 100 fL (ref 80.0–100.0)
Monocytes Absolute: 3.9 10*3/uL — ABNORMAL HIGH (ref 0.1–1.0)
Monocytes Relative: 60 %
Neutro Abs: 1.3 10*3/uL — ABNORMAL LOW (ref 1.7–7.7)
Neutrophils Relative %: 20 %
Platelets: 120 10*3/uL — ABNORMAL LOW (ref 150–400)
RBC: 2.55 MIL/uL — ABNORMAL LOW (ref 3.87–5.11)
RDW: 15.6 % — ABNORMAL HIGH (ref 11.5–15.5)
WBC: 6.6 10*3/uL (ref 4.0–10.5)
nRBC: 0.3 % — ABNORMAL HIGH (ref 0.0–0.2)

## 2020-06-09 LAB — HEPATIC FUNCTION PANEL
ALT: 69 U/L — ABNORMAL HIGH (ref 0–44)
AST: 147 U/L — ABNORMAL HIGH (ref 15–41)
Albumin: 1.1 g/dL — ABNORMAL LOW (ref 3.5–5.0)
Alkaline Phosphatase: 67 U/L (ref 38–126)
Bilirubin, Direct: 0.1 mg/dL (ref 0.0–0.2)
Indirect Bilirubin: 0.5 mg/dL (ref 0.3–0.9)
Total Bilirubin: 0.6 mg/dL (ref 0.3–1.2)
Total Protein: 4.6 g/dL — ABNORMAL LOW (ref 6.5–8.1)

## 2020-06-09 LAB — GLUCOSE, CAPILLARY
Glucose-Capillary: 120 mg/dL — ABNORMAL HIGH (ref 70–99)
Glucose-Capillary: 90 mg/dL (ref 70–99)
Glucose-Capillary: 59 mg/dL — ABNORMAL LOW (ref 70–99)
Glucose-Capillary: 70 mg/dL (ref 70–99)
Glucose-Capillary: 105 mg/dL — ABNORMAL HIGH (ref 70–99)
Glucose-Capillary: 177 mg/dL — ABNORMAL HIGH (ref 70–99)
Glucose-Capillary: 196 mg/dL — ABNORMAL HIGH (ref 70–99)

## 2020-06-09 LAB — TRIGLYCERIDES: Triglycerides: 642 mg/dL — ABNORMAL HIGH (ref <150)

## 2020-06-09 LAB — LEGIONELLA PNEUMOPHILA SEROGP 1 UR AG: L. pneumophila Serogp 1 Ur Ag: NEGATIVE

## 2020-06-09 MED ORDER — AMIODARONE LOAD VIA INFUSION
150.0000 mg | Freq: Once | INTRAVENOUS | Status: AC
  Administered 2020-06-09: 150 mg via INTRAVENOUS

## 2020-06-09 MED ORDER — AMIODARONE HCL IN DEXTROSE 360-4.14 MG/200ML-% IV SOLN
60.0000 mg/h | INTRAVENOUS | Status: AC
  Administered 2020-06-09 (×2): 60 mg/h via INTRAVENOUS
  Filled 2020-06-09: qty 200

## 2020-06-09 MED ORDER — AMIODARONE HCL IN DEXTROSE 360-4.14 MG/200ML-% IV SOLN
30.0000 mg/h | INTRAVENOUS | Status: DC
  Administered 2020-06-10 – 2020-06-11 (×5): 30 mg/h via INTRAVENOUS
  Filled 2020-06-09 (×6): qty 200

## 2020-06-09 MED ORDER — FUROSEMIDE 10 MG/ML IJ SOLN
40.0000 mg | Freq: Two times a day (BID) | INTRAMUSCULAR | Status: DC
  Administered 2020-06-09 – 2020-06-10 (×2): 40 mg via INTRAVENOUS
  Filled 2020-06-09 (×2): qty 4

## 2020-06-09 MED ORDER — FUROSEMIDE 10 MG/ML IJ SOLN
40.0000 mg | Freq: Once | INTRAMUSCULAR | Status: AC
  Administered 2020-06-09: 40 mg via INTRAVENOUS
  Filled 2020-06-09: qty 4

## 2020-06-09 MED ORDER — AMIODARONE HCL IN DEXTROSE 360-4.14 MG/200ML-% IV SOLN
INTRAVENOUS | Status: AC
  Filled 2020-06-09: qty 200

## 2020-06-09 MED ORDER — CALCIUM GLUCONATE-NACL 1-0.675 GM/50ML-% IV SOLN
1.0000 g | Freq: Once | INTRAVENOUS | Status: AC
  Administered 2020-06-09: 1000 mg via INTRAVENOUS
  Filled 2020-06-09: qty 50

## 2020-06-09 NOTE — Progress Notes (Signed)
[facility] ADULT ICU REPLACEMENT PROTOCOL   The patient does apply for the [facility] Adult ICU Electrolyte Replacment Protocol based on the criteria listed below:   1. Is GFR >/= 30 ml/min? Yes.    Patient's GFR today is 47 2. Is SCr </= 2? Yes.   Patient's SCr is 1.14 ml/kg/hr 3. Did SCr increase >/= 0.5 in 24 hours? No. 4. Abnormal electrolyte(s): ionized Ca 0.95 5. Ordered repletion with: protocol 6. If a panic level lab has been reported, has the CCM MD in charge been notified? No..   Physician:    Ronda Fairly A 06/09/2020 5:14 AM

## 2020-06-09 NOTE — Progress Notes (Addendum)
  Amiodarone Drug - Drug Interaction Consult Note  Recommendations:  Monitor for muscle pain/weakness while on rosuvastatin Monitor for bradycardia, AV block, myocardial depression while on metoprolol Monitor, replace potassium while on furosemide Monitor for QTc prolongation and monitor, replace potassium/magnesium while on fluconazole, ondansetron, quetiapine  Amiodarone is metabolized by the cytochrome P450 system and therefore has the potential to cause many drug interactions. Amiodarone has an average plasma half-life of 50 days (range 20 to 100 days).   There is potential for drug interactions to occur several weeks or months after stopping treatment and the onset of drug interactions may be slow after initiating amiodarone.   [x]  Statins: Increased risk of myopathy. Simvastatin- restrict dose to 20mg  daily. Other statins: counsel patients to report any muscle pain or weakness immediately.  []  Anticoagulants: Amiodarone can increase anticoagulant effect. Consider warfarin dose reduction. Patients should be monitored closely and the dose of anticoagulant altered accordingly, remembering that amiodarone levels take several weeks to stabilize.  []  Antiepileptics: Amiodarone can increase plasma concentration of phenytoin, the dose should be reduced. Note that small changes in phenytoin dose can result in large changes in levels. Monitor patient and counsel on signs of toxicity.  [x]  Beta blockers: increased risk of bradycardia, AV block and myocardial depression. Sotalol - avoid concomitant use.  []   Calcium channel blockers (diltiazem and verapamil): increased risk of bradycardia, AV block and myocardial depression.  []   Cyclosporine: Amiodarone increases levels of cyclosporine. Reduced dose of cyclosporine is recommended.  []  Digoxin dose should be halved when amiodarone is started.  [x]  Diuretics: increased risk of cardiotoxicity if hypokalemia occurs.  []  Oral hypoglycemic agents  (glyburide, glipizide, glimepiride): increased risk of hypoglycemia. Patient's glucose levels should be monitored closely when initiating amiodarone therapy.   [x]  Drugs that prolong the QT interval:  Torsades de pointes risk may be increased with concurrent use - avoid if possible.  Monitor QTc, also keep magnesium/potassium WNL if concurrent therapy can't be avoided. Marland Kitchen Antibiotics: e.g. fluoroquinolones, erythromycin. . Antiarrhythmics: e.g. quinidine, procainamide, disopyramide, sotalol. . Antipsychotics: e.g. phenothiazines, haloperidol.  . Lithium, tricyclic antidepressants, and methadone  Ondansetron  Thank You,  Gillermina Hu, PharmD, BCPS, [facility] Clinical Pharmacist  06/09/2020 5:42 PM

## 2020-06-09 NOTE — Progress Notes (Signed)
Developed a fib with RVR.  BP low on pressor agents.  Will start amiodarone IV.  Chesley Mires, MD [place] Pager - [phone] 06/09/2020, 5:40 PM

## 2020-06-09 NOTE — Progress Notes (Signed)
Pt went into Afib.  MD notified.  HR low 100s-1 teens.

## 2020-06-09 NOTE — Progress Notes (Signed)
[place] Progress Note Patient Name: Katherine Wang DOB: 1946/03/27 MRN: [id number]   Date of Service  06/09/2020  HPI/Events of Note  Nursing concerned about increased WOB - CXR reveals: 1. Endotracheal tube tip about 15 mm superior to the carina. 2. Cardiomegaly with vascular congestion. Persistent consolidation at the left lung base with possible small left effusion. Slight increase in hazy airspace opacity at the right lung base. ABG on 40%/PRVC /TV 400/P 5 = 7.45/33.1/106. CVP = 14. Creatinine = 1.16.   eICU Interventions  Plan: 1. Lasix 40 mg IV X 1 now.  2. Xopenex Nebs already ordered.  2. Follow patient clinically.      Intervention Category Major Interventions: Other:  Lysle Dingwall 06/09/2020, 4:29 AM

## 2020-06-09 NOTE — Progress Notes (Signed)
NAME:  Katherine Wang, MRN:  [id number], DOB:  [birth year], LOS: 9 ADMISSION DATE:  06/23/2020, CONSULTATION DATE:  06/06/2020 REFERRING MD:  Carolyn Stare, CHIEF COMPLAINT:  Sepsis   Brief History   74 year old female with rheumatoid arthritis on biologic therapy for esophagitis presented with weakness and sore throat associated with cough and fever and odynophagia. Complicated by poor secretion clearance, dyspnea and hypotension concerning for sepsis. Found to have leukopenia on broad spectrum antimicrobial therapy.  PCCM consulted.    Past Medical History   Past Medical History:  Diagnosis Date  . Adenomatous colon polyp 2012  . Allergy   . Anxiety   . Aortic insufficiency   . Arthritis   . Basal cell carcinoma   . Cataract   . Depression   . Diverticulosis   . Fibromyalgia   . Fibromyalgia   . Hyperlipidemia   . Hypertension   . Macular degeneration   . Melanoma ([place])   . Murmur, heart   . Reflux   . Stroke ([place]) 20 years ago   per pt. mild stroke  . Thyroid disease    hypothroidism    Significant Hospital Events   8/3 admitted 8/9 PCCM consulted  Consults:  PCCM Heme/onc IR  Procedures:  None  Significant Diagnostic Tests:  CT soft tissue neck 8/3 > Imaging findings as described and most consistent with pharyngitis and supraglottitis as well as upper cervical esophagitis. There is prominent mucosal/submucosal edema affecting the palatine tonsils, oropharynx, epiglottis and supraglottic larynx. Resultant severe airway effacement at the level of the supraglottic larynx. No abscess or retropharyngeal collection is demonstrated. Mildly prominent right level II lymph node, possibly reactive. 6 mm ground-glass nodule within the left lung apex. Initial follow-up with CT at 6-12 months is recommended to confirm persistence.  Micro Data:  COVID 8/3 neg GAS 8/3 neg Blood cx 8/3 neg Blood cx 8/9 > neg to date  Tracheal aspirate 8/9 > normal resp flora  MRSA 8/9 > negative    Antimicrobials:  Acyclovir 8/3 >  Cefepime 8/9>  Diflucan 8/6> Vancomycin 8/9>8/11   Interim history/subjective:  Overnight, Tmax 102.2 for which given tylenol. Blood cx remain negative. Concerns for increased work of breathing for which CXR obtained demonstrative of cardiomegaly with vascular congestion, persistent left lung base consolidation with possible small left effusion. Patient given one dose of lasix.   Objective   Blood pressure (!) 90/56, pulse 91, temperature 97.7 F (36.5 C), temperature source Core, resp. rate (!) 26, height 5\' 2"  (1.575 m), weight 85 kg, SpO2 98 %. CVP:  [14 mmHg] 14 mmHg  Vent Mode: PSV;CPAP FiO2 (%):  [40 %] 40 % Set Rate:  [16 bmp] 16 bmp Vt Set:  [400 mL] 400 mL PEEP:  [5 cmH20] 5 cmH20 Pressure Support:  [12 cmH20] 12 cmH20 Plateau Pressure:  [10 cmH20-16 cmH20] 13 cmH20   Intake/Output Summary (Last 24 hours) at 06/09/2020 1040 Last data filed at 06/09/2020 6378 Gross per 24 hour  Intake 2210.16 ml  Output 1240 ml  Net 970.16 ml   Filed Weights   06/07/20 0500 06/08/20 0347 06/09/20 0343  Weight: 77.4 kg 82.8 kg 85 kg    Examination: General: critically ill appearing female, no acute distress, sedated and mechanically ventilated  HENT: [place]/AT, mucositis and tongue lesions with improved appearance, ET tube in place Lungs: clear to auscultation bilaterally Cardiovascular: RRR, S1 and S2 present, no m/r/g Abdomen: soft, nondistended, nontender, normoactive bowel sounds Extremities: distal pulses intact; warm and  dry; 2+ pitting edema of bilateral lower extremities  Neuro: heavily sedated, RASS -3,  PERRL  Assessment & Plan:  Severe sepsis and acute hypoxic respiratory failure suspect secondary to aspiration pneumonia:  Sputum cultures negative for staph or pseudomonas; blood cultures negative to date  Cuff leak appreciated yesterday. - Continue empiric abx coverage with cefepime day 4/7 - Continue full mechanical ventilator support,  TV 4-8cc/kg, SpO2 >90% - Will attempt to wean off sedation. SBT this AM and if able, will extubate - Levophed for MAP >65  Mucositis/esophagitis/ supraglottitis:  Improved;  presumed to be viral, EBV IgG + HSV positive  - Continue acyclovir + diflucan   Blood dyscrasia:  Monocytosis:  Patient was recommended for BM biopsy by heme/onc for this. Currently holding off as she is intubated.  - CBC monitoring - Rec bone biopsy once able   Hypervolemia:  Acute renal failure:  Lasix 40mg  bid; renal function stable this morning  Electrolytes wnl; suspect secondary to sepsis  - Continue to monitor   Hyperglycemia:  Likely in setting of acute illness; currently on Novolog 4U q4h + SSI - Goal CBG <180  Hypothyroidism:  Continue synthroid   Best practice:  Diet: tube feeds Pain/Anxiety/Delirium protocol (if indicated): per protocol VAP protocol (if indicated): per protocol DVT prophylaxis: Lovenox GI prophylaxis: Famotidine Glucose control: Novolog + SSI Mobility: bed rest Code Status: FULL  Family Communication: daily updates to patient's daughter, Elnoria Howard Disposition: ICU    Critical care time: 40 minutes

## 2020-06-09 NOTE — Progress Notes (Signed)
RT note: patient placed on CPAP/PSV of 12/5 at 0741.  Currently tolerating well.  Will continue to monitor.

## 2020-06-10 DIAGNOSIS — J029 Acute pharyngitis, unspecified: Secondary | ICD-10-CM | POA: Diagnosis not present

## 2020-06-10 DIAGNOSIS — J0431 Supraglottitis, unspecified, with obstruction: Secondary | ICD-10-CM

## 2020-06-10 DIAGNOSIS — I10 Essential (primary) hypertension: Secondary | ICD-10-CM | POA: Diagnosis not present

## 2020-06-10 DIAGNOSIS — F419 Anxiety disorder, unspecified: Secondary | ICD-10-CM | POA: Diagnosis not present

## 2020-06-10 DIAGNOSIS — K209 Esophagitis, unspecified without bleeding: Secondary | ICD-10-CM | POA: Diagnosis not present

## 2020-06-10 LAB — CBC WITH DIFFERENTIAL/PLATELET
Abs Immature Granulocytes: 1.32 10*3/uL — ABNORMAL HIGH (ref 0.00–0.07)
Basophils Absolute: 0 10*3/uL (ref 0.0–0.1)
Basophils Relative: 0 %
Eosinophils Absolute: 0.1 10*3/uL (ref 0.0–0.5)
Eosinophils Relative: 1 %
HCT: 26 % — ABNORMAL LOW (ref 36.0–46.0)
Hemoglobin: 8.5 g/dL — ABNORMAL LOW (ref 12.0–15.0)
Immature Granulocytes: 15 %
Lymphocytes Relative: 8 %
Lymphs Abs: 0.7 10*3/uL (ref 0.7–4.0)
MCH: 32.2 pg (ref 26.0–34.0)
MCHC: 32.7 g/dL (ref 30.0–36.0)
MCV: 98.5 fL (ref 80.0–100.0)
Monocytes Absolute: 4.8 10*3/uL — ABNORMAL HIGH (ref 0.1–1.0)
Monocytes Relative: 55 %
Neutro Abs: 1.8 10*3/uL (ref 1.7–7.7)
Neutrophils Relative %: 21 %
Platelets: 135 10*3/uL — ABNORMAL LOW (ref 150–400)
RBC: 2.64 MIL/uL — ABNORMAL LOW (ref 3.87–5.11)
RDW: 15.7 % — ABNORMAL HIGH (ref 11.5–15.5)
WBC: 8.7 10*3/uL (ref 4.0–10.5)
nRBC: 0 % (ref 0.0–0.2)

## 2020-06-10 LAB — BASIC METABOLIC PANEL
Anion gap: 11 (ref 5–15)
BUN: 38 mg/dL — ABNORMAL HIGH (ref 8–23)
CO2: 21 mmol/L — ABNORMAL LOW (ref 22–32)
Calcium: 7.1 mg/dL — ABNORMAL LOW (ref 8.9–10.3)
Chloride: 100 mmol/L (ref 98–111)
Creatinine, Ser: 1.41 mg/dL — ABNORMAL HIGH (ref 0.44–1.00)
GFR calc Af Amer: 42 mL/min — ABNORMAL LOW (ref >60)
GFR calc non Af Amer: 36 mL/min — ABNORMAL LOW (ref >60)
Glucose, Bld: 206 mg/dL — ABNORMAL HIGH (ref 70–99)
Potassium: 3.7 mmol/L (ref 3.5–5.1)
Sodium: 132 mmol/L — ABNORMAL LOW (ref 135–145)

## 2020-06-10 LAB — POCT I-STAT 7, (LYTES, BLD GAS, ICA,H+H)
Acid-Base Excess: 1 mmol/L (ref 0.0–2.0)
Bicarbonate: 23.1 mmol/L (ref 20.0–28.0)
Calcium, Ion: 0.99 mmol/L — ABNORMAL LOW (ref 1.15–1.40)
HCT: 25 % — ABNORMAL LOW (ref 36.0–46.0)
Hemoglobin: 8.5 g/dL — ABNORMAL LOW (ref 12.0–15.0)
O2 Saturation: 99 %
Patient temperature: 98.9
Potassium: 4.2 mmol/L (ref 3.5–5.1)
Sodium: 133 mmol/L — ABNORMAL LOW (ref 135–145)
TCO2: 24 mmol/L (ref 22–32)
pCO2 arterial: 28.7 mmHg — ABNORMAL LOW (ref 32.0–48.0)
pH, Arterial: 7.515 — ABNORMAL HIGH (ref 7.350–7.450)
pO2, Arterial: 130 mmHg — ABNORMAL HIGH (ref 83.0–108.0)

## 2020-06-10 LAB — GLUCOSE, CAPILLARY
Glucose-Capillary: 183 mg/dL — ABNORMAL HIGH (ref 70–99)
Glucose-Capillary: 197 mg/dL — ABNORMAL HIGH (ref 70–99)
Glucose-Capillary: 233 mg/dL — ABNORMAL HIGH (ref 70–99)
Glucose-Capillary: 193 mg/dL — ABNORMAL HIGH (ref 70–99)
Glucose-Capillary: 189 mg/dL — ABNORMAL HIGH (ref 70–99)
Glucose-Capillary: 166 mg/dL — ABNORMAL HIGH (ref 70–99)
Glucose-Capillary: 183 mg/dL — ABNORMAL HIGH (ref 70–99)

## 2020-06-10 LAB — TRIGLYCERIDES: Triglycerides: 960 mg/dL — ABNORMAL HIGH (ref <150)

## 2020-06-10 LAB — LIPASE, BLOOD: Lipase: 52 U/L — ABNORMAL HIGH (ref 11–51)

## 2020-06-10 LAB — AMYLASE: Amylase: 63 U/L (ref 28–100)

## 2020-06-10 LAB — CALCIUM, IONIZED: Calcium, Ionized, Serum: 4.2 mg/dL — ABNORMAL LOW (ref 4.5–5.6)

## 2020-06-10 MED ORDER — HEPARIN BOLUS VIA INFUSION
2000.0000 [IU] | Freq: Once | INTRAVENOUS | Status: AC
  Administered 2020-06-10: 2000 [IU] via INTRAVENOUS
  Filled 2020-06-10: qty 2000

## 2020-06-10 MED ORDER — CALCIUM GLUCONATE-NACL 1-0.675 GM/50ML-% IV SOLN
1.0000 g | Freq: Once | INTRAVENOUS | Status: AC
  Administered 2020-06-10: 1000 mg via INTRAVENOUS
  Filled 2020-06-10: qty 50

## 2020-06-10 MED ORDER — HEPARIN (PORCINE) 25000 UT/250ML-% IV SOLN
1000.0000 [IU]/h | INTRAVENOUS | Status: DC
  Administered 2020-06-10 – 2020-06-12 (×3): 1000 [IU]/h via INTRAVENOUS
  Filled 2020-06-10 (×3): qty 250

## 2020-06-10 MED ORDER — FUROSEMIDE 10 MG/ML IJ SOLN
40.0000 mg | Freq: Every day | INTRAMUSCULAR | Status: DC
  Administered 2020-06-11: 40 mg via INTRAVENOUS
  Filled 2020-06-10: qty 4

## 2020-06-10 MED ORDER — FENTANYL CITRATE (PF) 100 MCG/2ML IJ SOLN
25.0000 ug | INTRAMUSCULAR | Status: DC | PRN
  Administered 2020-06-10 – 2020-06-11 (×6): 100 ug via INTRAVENOUS
  Filled 2020-06-10 (×6): qty 2

## 2020-06-10 NOTE — Progress Notes (Addendum)
NAME:  Katherine Wang, MRN:  [id number], DOB:  [birth year], LOS: 37 ADMISSION DATE:  06/20/2020, CONSULTATION DATE:  06/06/2020 REFERRING MD:  Carolyn Stare, CHIEF COMPLAINT:  Sepsis   Brief History   74 year old female with rheumatoid arthritis on biologic therapy for esophagitis presented with weakness and sore throat associated with cough and fever and odynophagia. Complicated by poor secretion clearance, dyspnea and hypotension concerning for sepsis. Found to have leukopenia on broad spectrum antimicrobial therapy.  PCCM consulted.    Past Medical History   Past Medical History:  Diagnosis Date   Adenomatous colon polyp 2012   Allergy    Anxiety    Aortic insufficiency    Arthritis    Basal cell carcinoma    Cataract    Depression    Diverticulosis    Fibromyalgia    Fibromyalgia    Hyperlipidemia    Hypertension    Macular degeneration    Melanoma ([place])    Murmur, heart    Reflux    Stroke ([place]) 20 years ago   per pt. mild stroke   Thyroid disease    hypothroidism    Significant Hospital Events   8/3 admitted 8/9 PCCM consulted  Consults:  PCCM Heme/onc IR  Procedures:  None  Significant Diagnostic Tests:  CT soft tissue neck 8/3 > Imaging findings as described and most consistent with pharyngitis and supraglottitis as well as upper cervical esophagitis. There is prominent mucosal/submucosal edema affecting the palatine tonsils, oropharynx, epiglottis and supraglottic larynx. Resultant severe airway effacement at the level of the supraglottic larynx. No abscess or retropharyngeal collection is demonstrated. Mildly prominent right level II lymph node, possibly reactive. 6 mm ground-glass nodule within the left lung apex. Initial follow-up with CT at 6-12 months is recommended to confirm persistence.  Micro Data:  COVID 8/3 neg GAS 8/3 neg Blood cx 8/3 neg Blood cx 8/9 > neg to date  Tracheal aspirate 8/9 > normal resp flora  MRSA 8/9 > negative   Antimicrobials:  Acyclovir 8/3 >  Cefepime 8/9> 8/13 Diflucan 8/6> Vancomycin 8/9>8/11   Interim history/subjective:  Patient started on amiodarone gtt for A.fib with RVR.  Afebrile overnight but was tachypneic with respiratory alkalosis for which given fentanyl.   Objective   Blood pressure 140/61, pulse 90, temperature 99 F (37.2 C), temperature source Core (Comment), resp. rate (!) 36, height 5\' 2"  (1.575 m), weight 88 kg, SpO2 100 %.    Vent Mode: PRVC FiO2 (%):  [40 %] 40 % Set Rate:  [16 bmp-18 bmp] 18 bmp Vt Set:  [400 mL] 400 mL PEEP:  [5 cmH20] 5 cmH20 Pressure Support:  [12 cmH20] 12 cmH20 Plateau Pressure:  [12 cmH20] 12 cmH20   Intake/Output Summary (Last 24 hours) at 06/10/2020 0747 Last data filed at 06/10/2020 0600 Gross per 24 hour  Intake 3560.62 ml  Output 3075 ml  Net 485.62 ml   Filed Weights   06/08/20 0347 06/09/20 0343 06/10/20 0500  Weight: 82.8 kg 85 kg 88 kg    Examination: General: critically ill appearing female, no acute distress, sedated and mechanically ventilated  HENT: [place]/AT, mucositis and tongue lesions improved, ET tube in place Lungs: clear to auscultation bilaterally Cardiovascular: RRR, S1 and S2 present, no m/r/g Abdomen: soft, nondistended, nontender, normoactive bowel sounds Extremities: distal pulses intact; warm and dry; 2+ pitting edema of bilateral lower extremities  Neuro: heavily sedated, RASS -3,  PERRL  Assessment & Plan:  Sepsis and acute hypoxic respiratory failure suspect  secondary to aspiration pneumonia:  Sputum cultures negative for staph or pseudomonas; blood cultures negative to date  - Has had 5 days of empiric abx coverage with cefepime  - Continue full mechanical ventilator support, TV 4-8cc/kg, SpO2 >90% - Will attempt to wean off sedation as able  - Wean levophed as tolerated to keep MAP >65  Atrial fibrillation with RVR: Suspect secondary to acute illness Started on amiodarone gtt  - Continue  amiodarone gtt at this time   Mucositis/supraglottitis:  Improving; presumed to be viral, EBV IgG + HSV positive  - Continue acyclovir for total of 14 days   Candida esophagitis: - Continue diflucan for total of 14 days   Hypertriglyceridemia: Patient with worsening triglycerides despite off propofol for >48 hours.  - Check amylase/lipase - Trend   Blood dyscrasia:  Monocytosis:  Patient was recommended for BM biopsy by heme/onc for this. Currently holding off as she is intubated.  - CBC monitoring - Rec bone biopsy once able   Hypervolemia:  Acute renal failure:  Patient given lasix 40mg  bid yesterday with 3L UOP noted. However, did not to have increase in sCr. Still remains net +8L. Will adjust to lasix 40mg  once daily  Electrolytes wnl; - Lasix 40mg  daily  - Continue to monitor   Hyperglycemia:  Likely in setting of acute illness; currently on Novolog 4U q4h + SSI - Goal CBG <180  Hypothyroidism:  Continue synthroid   Best practice:  Diet: tube feeds Pain/Anxiety/Delirium protocol (if indicated): per protocol VAP protocol (if indicated): per protocol DVT prophylaxis: Lovenox GI prophylaxis: Famotidine Glucose control: Novolog + SSI Mobility: bed rest Code Status: FULL  Family Communication: daily updates to patient's daughter, Elnoria Howard Disposition: ICU    Critical care time: 40 minutes

## 2020-06-10 NOTE — Progress Notes (Signed)
[facility] Progress Note Patient Name: Katherine Wang DOB: 11/17/1945 MRN: [id number]   Date of Service  06/10/2020  HPI/Events of Note  Tachypnea with respiratory alkalosis.  eICU Interventions  Fentanyl 25-100 mcg Q 2 hours ordered, prior order for Dilaudid 0.5 mg iv Q 2 hours discontinued.        Kerry Kass Savita Runner 06/10/2020, 4:47 AM

## 2020-06-10 NOTE — Progress Notes (Signed)
RBC lab low at 2.64. Dr. made aware. No further instructions at this time.

## 2020-06-10 NOTE — Progress Notes (Signed)
RT note. ABG drawn this morning.  Pt. Alkalotic. Notified Gretchen, RN in the black box.  Waiting to hear back on if doctor would like to switch vent settings. RT will continue to monitor. Pt. Is resting comfortably and sat 99.

## 2020-06-10 NOTE — Progress Notes (Signed)
ANTICOAGULATION CONSULT NOTE - Initial Consult  Pharmacy Consult for heparin Indication: atrial fibrillation  Allergies  Allergen Reactions   Tramadol Other (See Comments)    dizzy   Statins     Body ache    Patient Measurements: Height: 5\' 2"  (157.5 cm) Weight: 88 kg (194 lb 0.1 oz) IBW/kg (Calculated) : 50.1 Heparin Dosing Weight: 70.2kg  Vital Signs: Temp: 97.8 F (36.6 C) (08/13 0800) Temp Source: Core (08/13 0800) BP: 112/59 (08/13 1000) Pulse Rate: 80 (08/13 1015)  Labs: Recent Labs    06/08/20 0345 06/08/20 0345 06/09/20 0339 06/09/20 0339 06/09/20 0343 06/09/20 0343 06/10/20 0215 06/10/20 0414  HGB 8.7*   < > 8.2*   < > 11.6*   < > 8.5* 8.5*  HCT 26.5*   < > 25.5*   < > 34.0*  --  26.0* 25.0*  PLT 121*  --  120*  --   --   --  135*  --   CREATININE 1.16*  --  1.14*  --   --   --  1.41*  --    < > = values in this interval not displayed.    Estimated Creatinine Clearance: 35 mL/min (A) (by C-G formula based on SCr of 1.41 mg/dL (H)).   Medical History: Past Medical History:  Diagnosis Date   Adenomatous colon polyp 2012   Allergy    Anxiety    Aortic insufficiency    Arthritis    Basal cell carcinoma    Cataract    Depression    Diverticulosis    Fibromyalgia    Fibromyalgia    Hyperlipidemia    Hypertension    Macular degeneration    Melanoma ([place])    Murmur, heart    Reflux    Stroke ([place]) 20 years ago   per pt. mild stroke   Thyroid disease    hypothroidism     Medications:  Infusions:   sodium chloride     acyclovir Stopped (06/10/20 0722)   amiodarone 30 mg/hr (06/10/20 1000)   ceFEPime (MAXIPIME) IV Stopped (06/10/20 0854)   dexmedetomidine (PRECEDEX) IV infusion 1.4 mcg/kg/hr (06/10/20 1000)   feeding supplement (VITAL AF 1.2 CAL) 1,000 mL (06/10/20 0210)   fluconazole (DIFLUCAN) IV Stopped (06/09/20 1440)   lactated ringers 50 mL/hr at 06/10/20 1000   norepinephrine (LEVOPHED) Adult  infusion 4 mcg/min (06/10/20 1000)    Assessment: Katherine Wang is a 74yoF admitted on 8/3 for acute respiratory failure secondary to aspiration pneumonia, being treated with cefepime. She is also on acyclovir for mucositis (viral with EBV and HSV positive) and fluconazole for candida esophagitis.   She developed Afib with RVR overnight 8/12 likely related to her infection. Will need to treat with full dose anticoagulation now instead of VTE prophylaxis.  Hgb has ranged from 8-11 this admission, platelets are stable. No s/sx bleeding charted. Patient has worsening kidney function and SCr up from 1.1 to 1.4 today.   Will do a small bolus based on fluctuating hemoglobin and renal function and start her drip at 14units/kg/hr.   Goal of Therapy:  Heparin level 0.3-0.7 units/ml Monitor platelets by anticoagulation protocol: Yes   Plan:  D/c lovenox 40mg  qd Give heparin bolus 2,000 units IV x1 Start heparin infusion at 1,000 units/hr Check heparin level in 8 hours and adjust heparin rate as needed  Mercy Riding, PharmD PGY1 Acute Care Pharmacy Resident Please refer to [facility] for unit-specific pharmacist

## 2020-06-10 NOTE — Plan of Care (Signed)

## 2020-06-10 NOTE — Progress Notes (Signed)
Nutrition Follow-up  DOCUMENTATION CODES:   Obesity unspecified  INTERVENTION:   Continue tube feeding via Cortrak: Vital AF 1.2 at 60 ml/h (1440 ml per day)  Provides 1728 kcal, 108 gm protein, 1168 ml free water daily  NUTRITION DIAGNOSIS:   Inadequate oral intake related to inability to eat as evidenced by NPO status.  Ongoing   GOAL:   Patient will meet greater than or equal to 90% of their needs  Met with TF  MONITOR:   Diet advancement, Labs, Weight trends, Skin, I & O's, TF tolerance  REASON FOR ASSESSMENT:   Ventilator Enteral/tube feeding initiation and management  ASSESSMENT:   Katherine Wang is a 74 y.o. female with medical history significant of hypertension, hyperlipidemia, hypothyroidism, anxiety, depression, and rheumatoid arthritis presents with complaints of worsening sore throat over the last 2-3 weeks.   Discussed patient in ICU rounds and with RN today. Trying to d/c levophed today. Triglycerides are trending up despite being off propofol.   Currently receiving TF via Cortrak tube (placed 8/6, tip in the stomach): Vital AF 1.2 at 60 ml/h providing 1728 kcal, 108 gm protein, 1168 ml free water daily. Patient has been tolerating this well.   Patient remains intubated on ventilator support MV: 13 L/min Temp (24hrs), Avg:99.4 F (37.4 C), Min:97.8 F (36.6 C), Max:101.1 F (38.4 C)   Labs reviewed. Sodium 133, BUN 38, creat 1.41, triglycerides 960 (trending up, off propofol) CBG: [phone]  Medications reviewed and include colace, lasix, novolog, miralax, precedex, levophed, amiodarone.  Weight is continuing to trend upward. I/O +14.9 L since admission.  Diet Order:   Diet Order            Diet NPO time specified  Diet effective midnight                 EDUCATION NEEDS:   Education needs have been addressed  Skin:  Skin Assessment: Reviewed RN Assessment  Last BM:  8/11  Height:   Ht Readings from Last 1 Encounters:   06/06/20 5' 2"  (1.575 m)    Weight:   Wt Readings from Last 1 Encounters:  06/10/20 88 kg   Admission weight 69 kg (BMI=28.2)  Estimated Nutritional Needs:   Kcal:  1710  Protein:  100-110 gm  Fluid:  > 1.6 L    Lucas Mallow, RD, LDN, CNSC Please refer to Amion for contact information.

## 2020-06-11 ENCOUNTER — Inpatient Hospital Stay (HOSPITAL_COMMUNITY): Payer: Medicare Other

## 2020-06-11 DIAGNOSIS — K209 Esophagitis, unspecified without bleeding: Secondary | ICD-10-CM | POA: Diagnosis not present

## 2020-06-11 DIAGNOSIS — F419 Anxiety disorder, unspecified: Secondary | ICD-10-CM | POA: Diagnosis not present

## 2020-06-11 DIAGNOSIS — J029 Acute pharyngitis, unspecified: Secondary | ICD-10-CM | POA: Diagnosis not present

## 2020-06-11 DIAGNOSIS — I10 Essential (primary) hypertension: Secondary | ICD-10-CM | POA: Diagnosis not present

## 2020-06-11 LAB — POCT I-STAT 7, (LYTES, BLD GAS, ICA,H+H)
Acid-Base Excess: 0 mmol/L (ref 0.0–2.0)
Bicarbonate: 21.7 mmol/L (ref 20.0–28.0)
Calcium, Ion: 0.97 mmol/L — ABNORMAL LOW (ref 1.15–1.40)
HCT: 24 % — ABNORMAL LOW (ref 36.0–46.0)
Hemoglobin: 8.2 g/dL — ABNORMAL LOW (ref 12.0–15.0)
O2 Saturation: 99 %
Patient temperature: 97.4
Potassium: 2.9 mmol/L — ABNORMAL LOW (ref 3.5–5.1)
Sodium: 137 mmol/L (ref 135–145)
TCO2: 22 mmol/L (ref 22–32)
pCO2 arterial: 23.1 mmHg — ABNORMAL LOW (ref 32.0–48.0)
pH, Arterial: 7.578 — ABNORMAL HIGH (ref 7.350–7.450)
pO2, Arterial: 133 mmHg — ABNORMAL HIGH (ref 83.0–108.0)

## 2020-06-11 LAB — CULTURE, BLOOD (ROUTINE X 2)
Culture: NO GROWTH
Culture: NO GROWTH

## 2020-06-11 LAB — CBC WITH DIFFERENTIAL/PLATELET
Abs Immature Granulocytes: 1.76 10*3/uL — ABNORMAL HIGH (ref 0.00–0.07)
Basophils Absolute: 0 10*3/uL (ref 0.0–0.1)
Basophils Relative: 0 %
Eosinophils Absolute: 0 10*3/uL (ref 0.0–0.5)
Eosinophils Relative: 0 %
HCT: 23.8 % — ABNORMAL LOW (ref 36.0–46.0)
Hemoglobin: 7.8 g/dL — ABNORMAL LOW (ref 12.0–15.0)
Immature Granulocytes: 24 %
Lymphocytes Relative: 8 %
Lymphs Abs: 0.6 10*3/uL — ABNORMAL LOW (ref 0.7–4.0)
MCH: 31.7 pg (ref 26.0–34.0)
MCHC: 32.8 g/dL (ref 30.0–36.0)
MCV: 96.7 fL (ref 80.0–100.0)
Monocytes Absolute: 3.3 10*3/uL — ABNORMAL HIGH (ref 0.1–1.0)
Monocytes Relative: 46 %
Neutro Abs: 1.6 10*3/uL — ABNORMAL LOW (ref 1.7–7.7)
Neutrophils Relative %: 22 %
Platelets: 108 10*3/uL — ABNORMAL LOW (ref 150–400)
RBC: 2.46 MIL/uL — ABNORMAL LOW (ref 3.87–5.11)
RDW: 15.6 % — ABNORMAL HIGH (ref 11.5–15.5)
WBC: 7.3 10*3/uL (ref 4.0–10.5)
nRBC: 0.3 % — ABNORMAL HIGH (ref 0.0–0.2)

## 2020-06-11 LAB — BASIC METABOLIC PANEL
Anion gap: 10 (ref 5–15)
BUN: 43 mg/dL — ABNORMAL HIGH (ref 8–23)
CO2: 22 mmol/L (ref 22–32)
Calcium: 7.1 mg/dL — ABNORMAL LOW (ref 8.9–10.3)
Chloride: 101 mmol/L (ref 98–111)
Creatinine, Ser: 1.68 mg/dL — ABNORMAL HIGH (ref 0.44–1.00)
GFR calc Af Amer: 34 mL/min — ABNORMAL LOW (ref >60)
GFR calc non Af Amer: 29 mL/min — ABNORMAL LOW (ref >60)
Glucose, Bld: 132 mg/dL — ABNORMAL HIGH (ref 70–99)
Potassium: 3 mmol/L — ABNORMAL LOW (ref 3.5–5.1)
Sodium: 133 mmol/L — ABNORMAL LOW (ref 135–145)

## 2020-06-11 LAB — HEPARIN LEVEL (UNFRACTIONATED): Heparin Unfractionated: 0.41 IU/mL (ref 0.30–0.70)

## 2020-06-11 LAB — GLUCOSE, CAPILLARY
Glucose-Capillary: 117 mg/dL — ABNORMAL HIGH (ref 70–99)
Glucose-Capillary: 124 mg/dL — ABNORMAL HIGH (ref 70–99)
Glucose-Capillary: 148 mg/dL — ABNORMAL HIGH (ref 70–99)
Glucose-Capillary: 173 mg/dL — ABNORMAL HIGH (ref 70–99)
Glucose-Capillary: 85 mg/dL (ref 70–99)
Glucose-Capillary: 92 mg/dL (ref 70–99)

## 2020-06-11 MED ORDER — BISACODYL 10 MG RE SUPP
10.0000 mg | RECTAL | Status: DC | PRN
  Administered 2020-06-11 – 2020-06-12 (×2): 10 mg via RECTAL
  Filled 2020-06-11 (×2): qty 1

## 2020-06-11 MED ORDER — POTASSIUM CHLORIDE 10 MEQ/50ML IV SOLN
10.0000 meq | INTRAVENOUS | Status: AC
  Administered 2020-06-11 (×4): 10 meq via INTRAVENOUS
  Filled 2020-06-11 (×4): qty 50

## 2020-06-11 MED ORDER — ALBUMIN HUMAN 25 % IV SOLN
25.0000 g | Freq: Four times a day (QID) | INTRAVENOUS | Status: AC
  Administered 2020-06-11 – 2020-06-12 (×4): 25 g via INTRAVENOUS
  Filled 2020-06-11 (×4): qty 100

## 2020-06-11 MED ORDER — FENTANYL 2500MCG IN NS 250ML (10MCG/ML) PREMIX INFUSION
25.0000 ug/h | INTRAVENOUS | Status: DC
  Administered 2020-06-11: 200 ug/h via INTRAVENOUS
  Administered 2020-06-11: 100 ug/h via INTRAVENOUS
  Administered 2020-06-12: 200 ug/h via INTRAVENOUS
  Filled 2020-06-11 (×3): qty 250

## 2020-06-11 MED ORDER — SODIUM CHLORIDE 0.9 % IV SOLN
INTRAVENOUS | Status: DC | PRN
  Administered 2020-06-11 – 2020-06-12 (×2): 250 mL via INTRAVENOUS

## 2020-06-11 MED ORDER — FENTANYL CITRATE (PF) 100 MCG/2ML IJ SOLN
100.0000 ug | Freq: Once | INTRAMUSCULAR | Status: AC
  Administered 2020-06-11: 100 ug via INTRAVENOUS
  Filled 2020-06-11: qty 2

## 2020-06-11 NOTE — Progress Notes (Signed)
NAME:  Katherine Wang, MRN:  [id number], DOB:  January 26, 1946, LOS: 83 ADMISSION DATE:  06/18/2020, CONSULTATION DATE:  06/06/2020 REFERRING MD:  Carolyn Stare, CHIEF COMPLAINT:  Sepsis   Brief History   74 year old female with rheumatoid arthritis on biologic therapy for esophagitis presented with weakness and sore throat associated with cough and fever and odynophagia. Complicated by poor secretion clearance, dyspnea and hypotension concerning for sepsis. Found to have leukopenia on broad spectrum antimicrobial therapy.  PCCM consulted.    Past Medical History   Past Medical History:  Diagnosis Date  . Adenomatous colon polyp 2012  . Allergy   . Anxiety   . Aortic insufficiency   . Arthritis   . Basal cell carcinoma   . Cataract   . Depression   . Diverticulosis   . Fibromyalgia   . Fibromyalgia   . Hyperlipidemia   . Hypertension   . Macular degeneration   . Melanoma ([place])   . Murmur, heart   . Reflux   . Stroke ([place]) 20 years ago   per pt. mild stroke  . Thyroid disease    hypothroidism    Significant Hospital Events   8/3 admitted 8/9 PCCM consulted  Consults:  PCCM Heme/onc IR  Procedures:  None  Significant Diagnostic Tests:  CT soft tissue neck 8/3 > Imaging findings as described and most consistent with pharyngitis and supraglottitis as well as upper cervical esophagitis. There is prominent mucosal/submucosal edema affecting the palatine tonsils, oropharynx, epiglottis and supraglottic larynx. Resultant severe airway effacement at the level of the supraglottic larynx. No abscess or retropharyngeal collection is demonstrated. Mildly prominent right level II lymph node, possibly reactive. 6 mm ground-glass nodule within the left lung apex. Initial follow-up with CT at 6-12 months is recommended to confirm persistence.  Micro Data:  COVID 8/3 neg GAS 8/3 neg Blood cx 8/3 neg Blood cx 8/9 > neg to date  Tracheal aspirate 8/9 > normal resp flora  MRSA 8/9 > negative   Antimicrobials:  Acyclovir 8/3 >  Cefepime 8/9> 8/13 Diflucan 8/6> Vancomycin 8/9>8/11   Interim history/subjective:  Patient remains sedated and mechanically ventilated. No acute events overnight.   Objective   Blood pressure (!) 117/50, pulse 78, temperature (!) 97.4 F (36.3 C), temperature source Rectal, resp. rate (!) 36, height 5\' 2"  (1.575 m), weight 90 kg, SpO2 99 %.    Vent Mode: PRVC FiO2 (%):  [40 %] 40 % Set Rate:  [18 bmp] 18 bmp Vt Set:  [400 mL] 400 mL PEEP:  [5 cmH20] 5 cmH20 Plateau Pressure:  [14 cmH20-21 cmH20] 21 cmH20   Intake/Output Summary (Last 24 hours) at 06/11/2020 0746 Last data filed at 06/11/2020 4098 Gross per 24 hour  Intake 3336.53 ml  Output 3210 ml  Net 126.53 ml   Filed Weights   06/09/20 0343 06/10/20 0500 06/11/20 0219  Weight: 85 kg 88 kg 90 kg    Examination: General: critically ill appearing female, no acute distress, sedated and mechanically ventilated  HENT: [place]/AT, mucositis and tongue lesions improved, ET tube in place Lungs: clear to auscultation bilaterally Cardiovascular: RRR, S1 and S2 present, no m/r/g Abdomen: soft, mildly distended, nontender, normoactive bowel sounds Extremities: distal pulses intact; warm and dry; diffusely edematous  Neuro: heavily sedated, RASS -3,  PERRL  Assessment & Plan:  Sepsis and acute hypoxic respiratory failure suspect secondary to suspected aspiration pneumonia:  Sputum cultures negative for staph or pseudomonas; blood cultures negative to date. Completed 5 days of  empiric abx coverage with cefepime  - Continue full mechanical ventilator support, TV 4-8cc/kg, SpO2 >90% - Attempt to wean off sedation as able for RASS goal 0 to -1 - Wean levophed as tolerated to keep MAP >65  Atrial fibrillation with RVR: Suspect secondary to acute illness - Continue amiodarone gtt at this time   Mucositis/supraglottitis:  Improving; presumed to be viral, EBV IgG + HSV positive  - Continue acyclovir  for total of 14 days   Candida esophagitis: - Continue diflucan for total of 14 days   Hypertriglyceridemia: Patient with worsening triglycerides despite off propofol for >48 hours. Pancreatic enzymes wnl.  - Continue to monitor   Blood dyscrasia:  Monocytosis:  Tear drop cells noted on smear. Remainder of CBC not consistent with myelofibrosis. Patient was recommended for BM biopsy by heme/onc for this. Currently holding off as she is intubated.  - CBC monitoring - Rec bone biopsy once able   Hypervolemia:  Acute renal failure:  Significant diffuse edema. Hypoalbuminemia on labs. Has been getting lasix with appropriate urine output; however net +14L since admission. - Albumin gtt for 24 hours. Will hold off diuresis today.  - Continue to monitor   Hypokalemia: - Replacing  - F/u BMP   Hyperglycemia:  Likely in setting of acute illness; currently on Novolog 4U q4h + SSI - Goal CBG <180  Hypothyroidism:  Continue synthroid   Best practice:  Diet: tube feeds Pain/Anxiety/Delirium protocol (if indicated): per protocol VAP protocol (if indicated): per protocol DVT prophylaxis: Lovenox GI prophylaxis: Famotidine Glucose control: Novolog + SSI Mobility: bed rest Code Status: FULL  Family Communication: daily updates to patient's daughter, Elnoria Howard Disposition: ICU    Critical care time: 40 minutes

## 2020-06-11 NOTE — Progress Notes (Signed)
[facility] Progress Note Patient Name: Katherine Wang DOB: [birth year] MRN: [id number]   Date of Service  06/11/2020  HPI/Events of Note  K+ 3.0  eICU Interventions  KCL 10 meq  iv x 4        [name] U [name] 06/11/2020, 6:56 AM

## 2020-06-11 NOTE — Progress Notes (Signed)
[facility] for heparin Indication: atrial fibrillation  Allergies  Allergen Reactions  . Tramadol Other (See Comments)    dizzy  . Statins     Body ache    Patient Measurements: Height: 5\' 2"  (157.5 cm) Weight: 90 kg (198 lb 6.6 oz) IBW/kg (Calculated) : 50.1 Heparin Dosing Weight: 70.2kg  Vital Signs: Temp: 98 F (36.7 C) (08/14 1529) Temp Source: Core (08/14 1529) BP: 112/61 (08/14 1700) Pulse Rate: 83 (08/14 1700)  Labs: Recent Labs    06/09/20 0339 06/09/20 0343 06/10/20 0215 06/10/20 0215 06/10/20 0414 06/10/20 0414 06/11/20 0256 06/11/20 0335 06/11/20 1606  HGB 8.2*   < > 8.5*   < > 8.5*   < > 8.2* 7.8*  --   HCT 25.5*   < > 26.0*   < > 25.0*  --  24.0* 23.8*  --   PLT 120*  --  135*  --   --   --   --  108*  --   HEPARINUNFRC  --   --   --   --   --   --   --   --  0.41  CREATININE 1.14*  --  1.41*  --   --   --   --  1.68*  --    < > = values in this interval not displayed.    Estimated Creatinine Clearance: 29.7 mL/min (A) (by C-G formula based on SCr of 1.68 mg/dL (H)).   Medical History: Past Medical History:  Diagnosis Date  . Adenomatous colon polyp 2012  . Allergy   . Anxiety   . Aortic insufficiency   . Arthritis   . Basal cell carcinoma   . Cataract   . Depression   . Diverticulosis   . Fibromyalgia   . Fibromyalgia   . Hyperlipidemia   . Hypertension   . Macular degeneration   . Melanoma ([place])   . Murmur, heart   . Reflux   . Stroke ([place]) 20 years ago   per pt. mild stroke  . Thyroid disease    hypothroidism     Medications:  Infusions:  . sodium chloride    . sodium chloride Stopped (06/11/20 1410)  . acyclovir 550 mg (06/11/20 1734)  . albumin human 60 mL/hr at 06/11/20 1700  . amiodarone 30 mg/hr (06/11/20 1700)  . dexmedetomidine (PRECEDEX) IV infusion 2 mcg/kg/hr (06/11/20 1700)  . feeding supplement (VITAL AF 1.2 CAL) 60 mL/hr at 06/11/20 0754  . fentaNYL infusion INTRAVENOUS  200 mcg/hr (06/11/20 1700)  . fluconazole (DIFLUCAN) IV Stopped (06/11/20 1327)  . heparin 1,000 Units/hr (06/11/20 1700)  . lactated ringers 50 mL/hr at 06/11/20 1700  . norepinephrine (LEVOPHED) Adult infusion 8 mcg/min (06/11/20 1700)    Assessment: Katherine Wang is a 74yoF admitted on 8/3 for acute respiratory failure secondary to aspiration pneumonia, being treated with cefepime. She is also on acyclovir for mucositis (viral with EBV and HSV positive) and fluconazole for candida esophagitis.   She developed Afib with RVR overnight 8/12 likely related to her infection. Will need to treat with full dose anticoagulation now instead of VTE prophylaxis.  Heparin level came back at 0.41, on 1000 units/hr. Hgb 7.8, plt 108. No s/sx of bleeding or infusion issues. Scr continues to trend up to 1.68.   Goal of Therapy:  Heparin level 0.3-0.7 units/ml Monitor platelets by anticoagulation protocol: Yes   Plan:  Continue heparin infusion at 1,000 units/hr Monitor heparin level with daily  labs Monitor CBC, HL, and for s/sx of bleeding   Antonietta Jewel, PharmD, [place] Pharmacist  Phone: [phone] 06/11/2020 5:49 PM  Please check AMION for all [facility] phone numbers After 10:00 PM, call [facility] [phone]

## 2020-06-12 ENCOUNTER — Inpatient Hospital Stay (HOSPITAL_COMMUNITY): Payer: Medicare Other

## 2020-06-12 DIAGNOSIS — R4182 Altered mental status, unspecified: Secondary | ICD-10-CM

## 2020-06-12 DIAGNOSIS — F419 Anxiety disorder, unspecified: Secondary | ICD-10-CM | POA: Diagnosis not present

## 2020-06-12 DIAGNOSIS — J029 Acute pharyngitis, unspecified: Secondary | ICD-10-CM | POA: Diagnosis not present

## 2020-06-12 DIAGNOSIS — K209 Esophagitis, unspecified without bleeding: Secondary | ICD-10-CM | POA: Diagnosis not present

## 2020-06-12 DIAGNOSIS — I10 Essential (primary) hypertension: Secondary | ICD-10-CM | POA: Diagnosis not present

## 2020-06-12 LAB — CBC WITH DIFFERENTIAL/PLATELET
Abs Immature Granulocytes: 1.5 10*3/uL — ABNORMAL HIGH (ref 0.00–0.07)
Basophils Absolute: 0 10*3/uL (ref 0.0–0.1)
Basophils Relative: 0 %
Eosinophils Absolute: 0 10*3/uL (ref 0.0–0.5)
Eosinophils Relative: 1 %
HCT: 20.3 % — ABNORMAL LOW (ref 36.0–46.0)
Hemoglobin: 6.7 g/dL — CL (ref 12.0–15.0)
Immature Granulocytes: 27 %
Lymphocytes Relative: 12 %
Lymphs Abs: 0.7 10*3/uL (ref 0.7–4.0)
MCH: 32.1 pg (ref 26.0–34.0)
MCHC: 33 g/dL (ref 30.0–36.0)
MCV: 97.1 fL (ref 80.0–100.0)
Monocytes Absolute: 1.9 10*3/uL — ABNORMAL HIGH (ref 0.1–1.0)
Monocytes Relative: 32 %
Neutro Abs: 1.6 10*3/uL — ABNORMAL LOW (ref 1.7–7.7)
Neutrophils Relative %: 28 %
Platelets: 84 10*3/uL — ABNORMAL LOW (ref 150–400)
RBC: 2.09 MIL/uL — ABNORMAL LOW (ref 3.87–5.11)
RDW: 15.9 % — ABNORMAL HIGH (ref 11.5–15.5)
WBC: 5.7 10*3/uL (ref 4.0–10.5)
nRBC: 0.4 % — ABNORMAL HIGH (ref 0.0–0.2)

## 2020-06-12 LAB — CBC
HCT: 27.7 % — ABNORMAL LOW (ref 36.0–46.0)
Hemoglobin: 8.7 g/dL — ABNORMAL LOW (ref 12.0–15.0)
MCH: 31.6 pg (ref 26.0–34.0)
MCHC: 31.4 g/dL (ref 30.0–36.0)
MCV: 100.7 fL — ABNORMAL HIGH (ref 80.0–100.0)
Platelets: 81 10*3/uL — ABNORMAL LOW (ref 150–400)
RBC: 2.75 MIL/uL — ABNORMAL LOW (ref 3.87–5.11)
RDW: 17.2 % — ABNORMAL HIGH (ref 11.5–15.5)
WBC: 14.3 10*3/uL — ABNORMAL HIGH (ref 4.0–10.5)
nRBC: 1.3 % — ABNORMAL HIGH (ref 0.0–0.2)

## 2020-06-12 LAB — GLUCOSE, CAPILLARY
Glucose-Capillary: 131 mg/dL — ABNORMAL HIGH (ref 70–99)
Glucose-Capillary: 85 mg/dL (ref 70–99)
Glucose-Capillary: 46 mg/dL — ABNORMAL LOW (ref 70–99)
Glucose-Capillary: 107 mg/dL — ABNORMAL HIGH (ref 70–99)
Glucose-Capillary: 55 mg/dL — ABNORMAL LOW (ref 70–99)
Glucose-Capillary: 135 mg/dL — ABNORMAL HIGH (ref 70–99)

## 2020-06-12 LAB — BASIC METABOLIC PANEL
Anion gap: 15 (ref 5–15)
BUN: 43 mg/dL — ABNORMAL HIGH (ref 8–23)
CO2: 17 mmol/L — ABNORMAL LOW (ref 22–32)
Calcium: 7.6 mg/dL — ABNORMAL LOW (ref 8.9–10.3)
Chloride: 101 mmol/L (ref 98–111)
Creatinine, Ser: 1.97 mg/dL — ABNORMAL HIGH (ref 0.44–1.00)
GFR calc Af Amer: 28 mL/min — ABNORMAL LOW (ref >60)
GFR calc non Af Amer: 24 mL/min — ABNORMAL LOW (ref >60)
Glucose, Bld: 141 mg/dL — ABNORMAL HIGH (ref 70–99)
Potassium: 3.8 mmol/L (ref 3.5–5.1)
Sodium: 133 mmol/L — ABNORMAL LOW (ref 135–145)

## 2020-06-12 LAB — POCT I-STAT 7, (LYTES, BLD GAS, ICA,H+H)
Acid-base deficit: 6 mmol/L — ABNORMAL HIGH (ref 0.0–2.0)
Acid-base deficit: 7 mmol/L — ABNORMAL HIGH (ref 0.0–2.0)
Bicarbonate: 17.8 mmol/L — ABNORMAL LOW (ref 20.0–28.0)
Bicarbonate: 16.4 mmol/L — ABNORMAL LOW (ref 20.0–28.0)
Calcium, Ion: 1.03 mmol/L — ABNORMAL LOW (ref 1.15–1.40)
Calcium, Ion: 0.99 mmol/L — ABNORMAL LOW (ref 1.15–1.40)
HCT: 19 % — ABNORMAL LOW (ref 36.0–46.0)
HCT: 19 % — ABNORMAL LOW (ref 36.0–46.0)
Hemoglobin: 6.5 g/dL — CL (ref 12.0–15.0)
Hemoglobin: 6.5 g/dL — CL (ref 12.0–15.0)
O2 Saturation: 94 %
O2 Saturation: 98 %
Patient temperature: 97.7
Patient temperature: 98.8
Potassium: 3.8 mmol/L (ref 3.5–5.1)
Potassium: 4.1 mmol/L (ref 3.5–5.1)
Sodium: 135 mmol/L (ref 135–145)
Sodium: 133 mmol/L — ABNORMAL LOW (ref 135–145)
TCO2: 19 mmol/L — ABNORMAL LOW (ref 22–32)
TCO2: 17 mmol/L — ABNORMAL LOW (ref 22–32)
pCO2 arterial: 26.9 mmHg — ABNORMAL LOW (ref 32.0–48.0)
pCO2 arterial: 25.4 mmHg — ABNORMAL LOW (ref 32.0–48.0)
pH, Arterial: 7.426 (ref 7.350–7.450)
pH, Arterial: 7.42 (ref 7.350–7.450)
pO2, Arterial: 66 mmHg — ABNORMAL LOW (ref 83.0–108.0)
pO2, Arterial: 93 mmHg (ref 83.0–108.0)

## 2020-06-12 LAB — HEMOGLOBIN AND HEMATOCRIT, BLOOD
HCT: 28.2 % — ABNORMAL LOW (ref 36.0–46.0)
Hemoglobin: 8.8 g/dL — ABNORMAL LOW (ref 12.0–15.0)

## 2020-06-12 LAB — PREPARE RBC (CROSSMATCH)

## 2020-06-12 LAB — HEPARIN LEVEL (UNFRACTIONATED): Heparin Unfractionated: 0.4 IU/mL (ref 0.30–0.70)

## 2020-06-12 LAB — CALCIUM, IONIZED: Calcium, Ionized, Serum: 3.2 mg/dL — ABNORMAL LOW (ref 4.5–5.6)

## 2020-06-12 LAB — ABO/RH: ABO/RH(D): A POS

## 2020-06-12 MED ORDER — GLYCOPYRROLATE 0.2 MG/ML IJ SOLN: 0.2000 mg | INTRAMUSCULAR | Status: DC | PRN

## 2020-06-12 MED ORDER — FUROSEMIDE 10 MG/ML IJ SOLN
40.0000 mg | Freq: Once | INTRAMUSCULAR | Status: AC
  Administered 2020-06-12: 40 mg via INTRAVENOUS
  Filled 2020-06-12: qty 4

## 2020-06-12 MED ORDER — MORPHINE BOLUS VIA INFUSION
5.0000 mg | INTRAVENOUS | Status: DC | PRN
  Filled 2020-06-12: qty 5

## 2020-06-12 MED ORDER — DIPHENHYDRAMINE HCL 50 MG/ML IJ SOLN: 25.0000 mg | INTRAMUSCULAR | Status: DC | PRN

## 2020-06-12 MED ORDER — MIDAZOLAM HCL 2 MG/2ML IJ SOLN: 1.0000 mg | INTRAMUSCULAR | Status: DC | PRN

## 2020-06-12 MED ORDER — METOCLOPRAMIDE HCL 5 MG/ML IJ SOLN
10.0000 mg | Freq: Four times a day (QID) | INTRAMUSCULAR | Status: DC
  Filled 2020-06-12: qty 2

## 2020-06-12 MED ORDER — DEXTROSE 50 % IV SOLN
INTRAVENOUS | Status: AC
  Administered 2020-06-12: 12.5 g via INTRAVENOUS
  Filled 2020-06-12: qty 50

## 2020-06-12 MED ORDER — PHENYLEPHRINE CONCENTRATED 100MG/250ML (0.4 MG/ML) INFUSION SIMPLE
0.0000 ug/min | INTRAVENOUS | Status: DC
  Administered 2020-06-12: 250 ug/min via INTRAVENOUS
  Administered 2020-06-12 (×2): 400 ug/min via INTRAVENOUS
  Filled 2020-06-12 (×6): qty 250

## 2020-06-12 MED ORDER — MIDAZOLAM BOLUS VIA INFUSION (WITHDRAWAL LIFE SUSTAINING TX)
2.0000 mg | INTRAVENOUS | Status: DC | PRN
  Filled 2020-06-12: qty 2

## 2020-06-12 MED ORDER — VASOPRESSIN 20 UNITS/100 ML INFUSION FOR SHOCK
0.0000 [IU]/min | INTRAVENOUS | Status: DC
  Administered 2020-06-12: 0.03 [IU]/min via INTRAVENOUS
  Filled 2020-06-12: qty 100

## 2020-06-12 MED ORDER — SODIUM CHLORIDE 0.9% IV SOLUTION: Freq: Once | INTRAVENOUS | Status: AC

## 2020-06-12 MED ORDER — DEXTROSE 50 % IV SOLN
INTRAVENOUS | Status: AC
  Administered 2020-06-12: 50 mL
  Filled 2020-06-12: qty 50

## 2020-06-12 MED ORDER — FUROSEMIDE 10 MG/ML IJ SOLN
8.0000 mg/h | INTRAVENOUS | Status: DC
  Filled 2020-06-12: qty 25

## 2020-06-12 MED ORDER — AMIODARONE LOAD VIA INFUSION
150.0000 mg | Freq: Once | INTRAVENOUS | Status: AC
  Administered 2020-06-12: 150 mg via INTRAVENOUS
  Filled 2020-06-12: qty 83.34

## 2020-06-12 MED ORDER — DEXTROSE 50 % IV SOLN: 12.5000 g | INTRAVENOUS | Status: AC

## 2020-06-12 MED ORDER — DEXTROSE 50 % IV SOLN
25.0000 g | INTRAVENOUS | Status: AC
  Administered 2020-06-12: 25 g via INTRAVENOUS

## 2020-06-12 MED ORDER — ACETAMINOPHEN 650 MG RE SUPP: 650.0000 mg | Freq: Four times a day (QID) | RECTAL | Status: DC | PRN

## 2020-06-12 MED ORDER — ALBUMIN HUMAN 25 % IV SOLN: 25.0000 g | Freq: Once | INTRAVENOUS | Status: DC

## 2020-06-12 MED ORDER — MORPHINE SULFATE (PF) 2 MG/ML IV SOLN: 2.0000 mg | INTRAVENOUS | Status: DC | PRN

## 2020-06-12 MED ORDER — MORPHINE 100MG IN NS 100ML (1MG/ML) PREMIX INFUSION
0.0000 mg/h | INTRAVENOUS | Status: DC
  Administered 2020-06-12: 5 mg/h via INTRAVENOUS
  Filled 2020-06-12: qty 100

## 2020-06-12 MED ORDER — POLYVINYL ALCOHOL 1.4 % OP SOLN
1.0000 [drp] | Freq: Four times a day (QID) | OPHTHALMIC | Status: DC | PRN
  Filled 2020-06-12: qty 15

## 2020-06-12 MED ORDER — FUROSEMIDE 10 MG/ML IJ SOLN
160.0000 mg | Freq: Once | INTRAVENOUS | Status: AC
  Administered 2020-06-12: 160 mg via INTRAVENOUS
  Filled 2020-06-12: qty 10

## 2020-06-12 MED ORDER — DEXTROSE-NACL 5-0.9 % IV SOLN: INTRAVENOUS | Status: DC

## 2020-06-12 MED ORDER — FUROSEMIDE 10 MG/ML IJ SOLN
80.0000 mg | Freq: Once | INTRAMUSCULAR | Status: AC
  Administered 2020-06-12: 80 mg via INTRAVENOUS
  Filled 2020-06-12: qty 8

## 2020-06-12 MED ORDER — GLYCOPYRROLATE 1 MG PO TABS: 1.0000 mg | ORAL_TABLET | ORAL | Status: DC | PRN

## 2020-06-12 MED ORDER — PHENYLEPHRINE HCL-NACL 10-0.9 MG/250ML-% IV SOLN
0.0000 ug/min | INTRAVENOUS | Status: DC
  Administered 2020-06-12: 20 ug/min via INTRAVENOUS
  Administered 2020-06-12: 160 ug/min via INTRAVENOUS
  Filled 2020-06-12 (×2): qty 250

## 2020-06-12 MED ORDER — MIDAZOLAM 50MG/50ML (1MG/ML) PREMIX INFUSION
0.0000 mg/h | INTRAVENOUS | Status: DC
  Administered 2020-06-12: 1 mg/h via INTRAVENOUS
  Filled 2020-06-12: qty 50

## 2020-06-12 MED ORDER — ACETAMINOPHEN 325 MG PO TABS: 650.0000 mg | ORAL_TABLET | Freq: Four times a day (QID) | ORAL | Status: DC | PRN

## 2020-06-12 MED ORDER — DEXTROSE 5 % IV SOLN: INTRAVENOUS | Status: DC

## 2020-06-13 ENCOUNTER — Institutional Professional Consult (permissible substitution): Payer: Medicare Other | Admitting: Internal Medicine

## 2020-06-13 ENCOUNTER — Telehealth: Payer: Self-pay | Admitting: Family Medicine

## 2020-06-13 LAB — TYPE AND SCREEN
ABO/RH(D): A POS
Antibody Screen: NEGATIVE
Unit division: 0

## 2020-06-13 LAB — BPAM RBC
Blood Product Expiration Date: 202108302359
ISSUE DATE / TIME: 202108150525
Unit Type and Rh: 6200

## 2020-06-13 MED FILL — Sodium Chloride IV Soln 0.9%: INTRAVENOUS | Qty: 250 | Status: AC

## 2020-06-13 MED FILL — Phenylephrine HCl IV Soln 10 MG/ML: INTRAVENOUS | Qty: 1 | Status: CN

## 2020-06-13 MED FILL — Phenylephrine HCl IV Soln 10 MG/ML: INTRAVENOUS | Qty: 1 | Status: AC

## 2020-06-13 NOTE — Telephone Encounter (Signed)
Called her daughter Levada Dy who is also my patient, offered my deepest condolences

## 2020-06-29 NOTE — Progress Notes (Signed)
[facility] for heparin Indication: atrial fibrillation  Allergies  Allergen Reactions  . Tramadol Other (See Comments)    dizzy  . Statins     Body ache    Patient Measurements: Height: 5\' 2"  (157.5 cm) Weight: 92.6 kg (204 lb 2.3 oz) IBW/kg (Calculated) : 50.1 Heparin Dosing Weight: 70.2kg  Vital Signs: Temp: 98.2 F (36.8 C) (08/15 1110) Temp Source: Core (08/15 1110) BP: 101/50 (08/15 1110) Pulse Rate: 65 (08/15 1110)  Labs: Recent Labs    06/10/20 0215 06/10/20 0414 06/11/20 0335 06/11/20 0335 06/11/20 1606 06-24-20 0148 06/24/20 0148 06/24/2020 0157 06-24-20 0435 06/24/20 0837  HGB 8.5*   < > 7.8*   < >  --  6.7*   < > 6.5* 6.5*  --   HCT 26.0*   < > 23.8*   < >  --  20.3*  --  19.0* 19.0*  --   PLT 135*  --  108*  --   --  84*  --   --   --   --   HEPARINUNFRC  --   --   --   --  0.41  --   --   --   --  0.40  CREATININE 1.41*  --  1.68*  --   --  1.97*  --   --   --   --    < > = values in this interval not displayed.    Estimated Creatinine Clearance: 25.7 mL/min (A) (by C-G formula based on SCr of 1.97 mg/dL (H)).  Assessment: Katherine Wang is a 17yoF admitted on 8/3 for acute respiratory failure secondary to aspiration pneumonia, being treated with cefepime. She is also on acyclovir for mucositis (viral with EBV and HSV positive) and fluconazole for candida esophagitis.   She developed Afib with RVR overnight 8/12 likely related to her infection. Will need to treat with full dose anticoagulation now instead of VTE prophylaxis.  Hep lvl therapeutic this am but hemoglobin now in 6's  No bleeding, transfused this am  Goal of Therapy:  Heparin level 0.3-0.7 units/ml Monitor platelets by anticoagulation protocol: Yes   Plan:  Continue heparin infusion at 1000 units/hr - confirmed w Dr Ander Slade to continue Monitor heparin level with daily labs Monitor CBC, HL, and for s/sx of bleeding   Barth Kirks, PharmD, BCPS,  BCCCP Clinical Pharmacist [phone]  Please check AMION for all [facility] numbers  June 24, 2020 11:44 AM

## 2020-06-29 NOTE — Progress Notes (Addendum)
Bedside RN called d/t tachypnea over ventilator, labile BP and HR. Camera assessment made by Dr stretch, myself and bedside RN. CXR shows worsening Heart failure. Hemodynamic instability and poorly tolerated AFib. Will treat with lasix 80mg  lasix x1, re-bolus with amiodarone, change levo to neo and check CBC, BMP and ABG. All orers verified with dr Gillermina Phy. Continue to monitor

## 2020-06-29 NOTE — Progress Notes (Signed)
Maynard notified of tachypnea despite PRNs and max sedation, labile BP and HR with rhythm change to AFIB and decreased urine output. Orders placed for chest xray, lasix, d/c LR infusion, bolus of amio, levo changed to neo, and labs to be drawn. Will continue to monitor.

## 2020-06-29 NOTE — Progress Notes (Signed)
45 mL of versed, 80 mL morphine, 110 mL fentanyl, ALL wasted in sink with RN Misty.

## 2020-06-29 NOTE — Progress Notes (Signed)
RN called RT stating that pt is desating to 88-89%. RT instructed RN to increase pt's FiO2 to 100%. RT spoke with Dr. Ander Slade about pt's oxygen issue. He stated to increase PEEP from 8 to 10 and get an ABG in one hour.

## 2020-06-29 NOTE — Progress Notes (Addendum)
NAME:  Katherine Wang, MRN:  [id number], DOB:  1946-08-09, LOS: 12 ADMISSION DATE:  06/10/2020, CONSULTATION DATE:  06/06/2020 REFERRING MD:  Carolyn Stare, CHIEF COMPLAINT:  Sepsis   Brief History   74 year old female with rheumatoid arthritis on biologic therapy for esophagitis presented with weakness and sore throat associated with cough and fever and odynophagia. Complicated by poor secretion clearance, dyspnea and hypotension concerning for sepsis. Found to have leukopenia on broad spectrum antimicrobial therapy.  PCCM consulted.    Past Medical History   Past Medical History:  Diagnosis Date  . Adenomatous colon polyp 2012  . Allergy   . Anxiety   . Aortic insufficiency   . Arthritis   . Basal cell carcinoma   . Cataract   . Depression   . Diverticulosis   . Fibromyalgia   . Fibromyalgia   . Hyperlipidemia   . Hypertension   . Macular degeneration   . Melanoma ([place])   . Murmur, heart   . Reflux   . Stroke ([place]) 20 years ago   per pt. mild stroke  . Thyroid disease    hypothroidism    Significant Hospital Events   8/3 admitted 8/9 PCCM consulted   Consults:  PCCM Heme/onc IR  Procedures:  None  Significant Diagnostic Tests:  CT soft tissue neck 8/3 > Imaging findings as described and most consistent with pharyngitis and supraglottitis as well as upper cervical esophagitis. There is prominent mucosal/submucosal edema affecting the palatine tonsils, oropharynx, epiglottis and supraglottic larynx. Resultant severe airway effacement at the level of the supraglottic larynx. No abscess or retropharyngeal collection is demonstrated. Mildly prominent right level II lymph node, possibly reactive. 6 mm ground-glass nodule within the left lung apex. Initial follow-up with CT at 6-12 months is recommended to confirm persistence.  Micro Data:  COVID 8/3 neg GAS 8/3 neg Blood cx 8/3 neg Blood cx 8/9 > neg to date  Tracheal aspirate 8/9 > normal resp flora  MRSA 8/9 >  negative   Antimicrobials:  Acyclovir 8/3 >  Cefepime 8/9> 8/13 Diflucan 8/6> Vancomycin 8/9>8/11   Interim history/subjective:  Patient remains sedated and mechanically ventilated. No acute events overnight.  No significant improvement in status  Objective   Blood pressure (!) 107/55, pulse 64, temperature 98.2 F (36.8 C), temperature source Core, resp. rate (!) 27, height 5\' 2"  (1.575 m), weight 92.6 kg, SpO2 90 %.    Vent Mode: PRVC FiO2 (%):  [40 %-100 %] 80 % Set Rate:  [18 bmp] 18 bmp Vt Set:  [400 mL] 400 mL PEEP:  [5 cmH20-8 cmH20] 8 cmH20 Plateau Pressure:  [13 cmH20-26 cmH20] 26 cmH20   Intake/Output Summary (Last 24 hours) at July 06, 2020 1358 Last data filed at 07/06/20 1300 Gross per 24 hour  Intake 5183.12 ml  Output 440 ml  Net 4743.12 ml   Filed Weights   06/10/20 0500 06/11/20 0219 2020-07-06 0500  Weight: 88 kg 90 kg 92.6 kg    Examination: General: critically ill appearing female, no acute distress, sedated and mechanically ventilated. Swollen tongue HENT: ET tube in place, tongue swollen Lungs: clear to auscultation bilaterally, no rales Cardiovascular: RRR, S1 and S2 present, no m/r/g Abdomen: Soft, mildly distended Extremities: Generalized edema Neuro: heavily sedated, RASS -3,  PERRL  Assessment & Plan:  Sepsis and acute hypoxic respiratory failure suspect secondary to suspected aspiration pneumonia:  Sputum cultures negative for staph or pseudomonas; blood cultures negative to date. Completed 5 days of empiric abx coverage with  cefepime  - Continue full mechanical ventilator support, TV 4-8cc/kg, SpO2 >90% - Attempt to wean off sedation as able for RASS goal 0 to -1 - Wean levophed as tolerated to keep MAP >65 -On Neo-Synephrine  Atrial fibrillation with RVR: Suspect secondary to acute illness - completed loading IV Currently in sinus rhythm  Mucositis/supraglottitis:  -presumed to be viral, EBV IgG + HSV positive  - Continue acyclovir  for total of 14 days   Candida esophagitis: - Continue diflucan for total of 14 days   Hypertriglyceridemia: Patient with worsening triglycerides despite off propofol for >48 hours. Pancreatic enzymes wnl.  - Continue to monitor  - off propofol   Blood dyscrasia:  Monocytosis:  Tear drop cells noted on smear. Remainder of CBC not consistent with myelofibrosis. Patient was recommended for BM biopsy by heme/onc for this. Currently holding off as she is intubated.  - CBC monitoring - Rec bone biopsy once able   Hypervolemia:  Acute renal failure:  Significant diffuse edema. Hypoalbuminemia on labs. Has been getting lasix with appropriate urine output; however net +14L since admission. - Albumin gtt for 24 hours. Will hold off diuresis today.  - Continue to monitor  Will give lasix 40 today  Hypokalemia: - Replacing  - F/u BMP   Anemia -No evidence of acute blood loss -We will repeat H&H -S/p transfusion x1 unit -Monitor especially with patient being on heparin  Emesis -Tube feeding on hold -We will start Reglan, started at 15 for 48 hours -Reinitiate tube feeds in a.m.-06/13/2020  Hyperglycemia:  Likely in setting of acute illness; currently on Novolog 4U q4h + SSI - Goal CBG <180 -Did have an episode of hypoglycemia, will start on D5 saline at 50  Hypothyroidism:  Continue synthroid   Family requesting neurology opinion Consulted neurology, spoke with Ms Metger-Cihelka   Discussed extensively with daughter at bedside today She appreciates that prognosis is guarded Will not want her mother to suffer, just wants to give her the best chance possible, she was very active and functional prior to getting sick  Best practice:  Diet: tube feeds Pain/Anxiety/Delirium protocol (if indicated): per protocol VAP protocol (if indicated): per protocol DVT prophylaxis: Lovenox GI prophylaxis: Famotidine Glucose control: Novolog + SSI Mobility: bed rest Code Status: FULL    Family Communication: daily updates to patient's daughter, Elnoria Howard Disposition: ICU    The patient is critically ill with multiple organ systems failure and requires high complexity decision making for assessment and support, frequent evaluation and titration of therapies, application of advanced monitoring technologies and extensive interpretation of multiple databases. Critical Care Time devoted to patient care services described in this note independent of APP/resident time (if applicable)  is 45 minutes.   Sherrilyn Rist MD [facility] Personal pager: [phone] If unanswered, please page CCM On-call: [phone]

## 2020-06-29 NOTE — Progress Notes (Signed)
Increasing pressor requirement  Added vasopressin

## 2020-06-29 NOTE — Progress Notes (Signed)
Family at bedside, pt time of death 40 on comfort care. Elink MD aware. No heart sounds auscultated with April Singleton RN. CDS called. Family has patient belongings.

## 2020-06-29 NOTE — Consult Note (Addendum)
Neurology Consultation Reason for Consult: Family requesting neurology prognosis  Referring Physician: Sherrilyn Rist  CC: unable to give c/o at this time  History is obtained from: Patients dtr and chart review  HPI: Katherine Wang is a 74 y.o. female rheumatoid arthritis on Cimzia, course complicated by sepsis, mucositis/supraglottitis (EBV IgG positive and HSV positive), Candida esophagitis, sedation with propofol complicated by severe hypertriglyceridemia, blood dyscrasia (planned for bone marrow biopsy by heme/unk)  She has a history of rheumatoid arthritis previously managed on methotrexate and Arava, switched to Cimzia on 7/6.  Subsequently she developed a sore throat with sores, which had no improvement after trial of the several different antibiotics.  She was admitted on 8/3 and started initially on just acyclovir (8/3-present), with fluconazole added on 8/6 (-present), vancomycin (8/9-10) and cefepime on 8/9-8/13).  On 8/9 she developed tachycardia, hypotension, and respiratory distress for which PCCM was consulted, and she was intubated. She has continued to decline at this time and has become progressively unstable, req multiple pressors. She is Limited Code.   However, during our consult the pt slipped into 3rd degree block and after d/w daughter on no other possible treatments or work up possible at this time due to this instability, she has decided to transition to [place]. CCM, Dr Ander Slade, was called to bedside to further d/w [place] and family at bedside.  ROS: Unable to obtain due to altered mental status.   Past Medical History:  Diagnosis Date  . Adenomatous colon polyp 2012  . Allergy   . Anxiety   . Aortic insufficiency   . Arthritis   . Basal cell carcinoma   . Cataract   . Depression   . Diverticulosis   . Fibromyalgia   . Fibromyalgia   . Hyperlipidemia   . Hypertension   . Macular degeneration   . Melanoma ([place])   . Murmur, heart   . Reflux   . Stroke ([place]) 20  years ago   per pt. mild stroke  . Thyroid disease    hypothroidism      Family History  Problem Relation Age of Onset  . Heart disease Mother   . Hypertension Mother   . Mental illness Mother   . Allergies Mother   . Heart disease Father   . Hypertension Father   . Mental illness Father   . Emphysema Father        smoked  . Allergies Father   . Heart attack Father   . Hyperlipidemia Sister   . Hypertension Sister   . Allergies Sister   . Cancer Sister        oral  . Hypertension Sister   . Diabetes Sister   . Allergies Sister   . Epilepsy Son   . Aneurysm Son   . Hyperthyroidism Daughter   . Hypothyroidism Daughter   . Hypertension Daughter   . Basal cell carcinoma Daughter   . Colon cancer Neg Hx   . Esophageal cancer Neg Hx   . Rectal cancer Neg Hx   . Stomach cancer Neg Hx   . Pancreatic cancer Neg Hx   . Prostate cancer Neg Hx    Social History:  reports that she quit smoking about 36 years ago. Her smoking use included cigarettes. She has a 40.00 pack-year smoking history. She has never used smokeless tobacco. She reports that she does not drink alcohol and does not use drugs.   Exam: Current vital signs: BP (!) 106/53   Pulse 64  Temp 98.6 F (37 C) (Core)   Resp (!) 26   Ht _0  (1.575 m)   Wt 92.6 kg   SpO2 90%   BMI 37.34 kg/m  Vital signs in last 24 hours: Temp:  [96.8 F (36 C)-99.3 F (37.4 C)] 98.6 F (37 C) (08/15 1500) Pulse Rate:  [51-123] 64 (08/15 1529) Resp:  [16-42] 26 (08/15 1529) BP: (69-122)/(30-77) 106/53 (08/15 1529) SpO2:  [87 %-100 %] 90 % (08/15 1529) Arterial Line BP: (70-138)/(41-61) 91/48 (08/15 1500) FiO2 (%):  [40 %-100 %] 80 % (08/15 1529) Weight:  [92.6 kg] 92.6 kg (08/15 0500)   Physical Exam  Given Goals of Care conversation and decision to withdraw care at this thime, no exam was done out of respect for transition to comfort measures.    I have reviewed labs in epic and the results pertinent to this  consultation.  I have reviewed the images obtained pertinent to this consultation.  Impression: Given Ms. Hartel further decline and now in 3rd degree heart block despite max efforts and pressors she would not be stable to withstand an LP for further CNS evaluation, furthermore this would this change our current treatment as she is already on IV Acyclovir. Given her current decline, family does not want further neurologic wk up and would like to transition to [place] with terminal extubation.   Recommendations: 1) DNR/DNI and transition to [place]  Attending Neurologist's note:  I personally saw this patient, gathering history, performing a full neurologic examination, reviewing relevant labs, and formulated the assessment and plan, adding the note above, written with the assistance of my PA, for completeness and clarity to accurately reflect my thoughts  Lesleigh Noe MD-PhD [facility] [phone]  Addended for charge capture

## 2020-06-29 NOTE — Progress Notes (Signed)
RT came in pt's room this AM to find SpO2 88% on FiO2 40%. Per pt's chart, SpO2 >92%. RT increased pt's FiO2 to 70%. Pt's SpO2 now 92%.

## 2020-06-29 NOTE — Progress Notes (Signed)
RT spoke w/Dr. Ander Slade about pt's sat goal of >92%. He stated to increase pt's PEEP to 8 and adjust FiO2 to maintain SpO2 >92%.

## 2020-06-29 NOTE — Death Summary Note (Signed)
DEATH SUMMARY   Patient Details  Name: Katherine Wang MRN: [id number] DOB: 06-Jan-1946  Admission/Discharge Information   Admit Date:  06/05/2020  Date of Death: Date of Death: 06/17/20  Time of Death: Time of Death: 12/27/10  Length of Stay: December 15, 2022  Referring Physician: [name], MD   Reason(s) for Hospitalization  Patient presented to the hospital with complaints of weakness and sore throat, there was associated cough, fever, odynophagia, poor secretion clearance  Diagnoses  Preliminary cause of death: Hypoxemic respiratory failure Secondary Diagnoses (including complications and co-morbidities):  Active Problems:   Hypothyroidism   Essential hypertension   Anxiety   Viral pharyngitis   Hypokalemia   Supraglottitis   Esophagitis   Odynophagia Septic shock Aspiration pneumonia Atrial fibrillation Esophagitis Viral supraglottitis Acute kidney injury  Brief Hospital Course (including significant findings, care, treatment, and services provided and events leading to death)  Katherine Wang is a 74 y.o. year old female who admitted with complaints of weakness and sore throat, odynophagia, poor secretion clearance. Background history of hyperlipidemia, hypertension, hypothyroidism, anxiety, depression, rheumatoid arthritis She had recently started Biologics, transition from methotrexate and [place]. She had multiple treatments for a sore throat.  With deterioration of symptoms came in with a fever 103 To have multiple aphthous ulcers with evidence of pharyngitis on supraglottitis And had HSV antibody which was equivocal, EBV IgG positive with undetectable IgM There was presence of blast cells with recommendation of bone marrow biopsy Management work is continued until 8/9 when her breathing did worsen.  Required mechanical ventilation and pressors. Patient never really rallied following the initial intubation and pressor requirement, developed atrial fibrillation requiring  amiodarone. Pressor requirement did worsen with development of acute kidney injury With further escalation of care.  Goals of care discussions had with the family.  With continued deterioration and prognosis worsening a decision was made in patient's best interest to make her comfort measures only. Patient succumbed to her illness on Jun 17, 2020 at [facility] [address].  Pertinent Labs and Studies  Significant Diagnostic Studies CT Soft Tissue Neck W Contrast  Result Date: 05-Jun-2020 CLINICAL DATA:  Epiglottitis or tonsillitis suspected; worsening difficulty breathing, sensation of "a ball" in throat, not able to tolerate saliva, fluids or breathing well. Viral appearing rash on back of throat. EXAM: CT NECK WITH CONTRAST TECHNIQUE: Multidetector CT imaging of the neck was performed using the standard protocol following the bolus administration of intravenous contrast. CONTRAST:  44m OMNIPAQUE IOHEXOL 300 MG/ML  SOLN COMPARISON:  No pertinent prior exams are available for comparison. FINDINGS: Pharynx and larynx: There is diffuse abnormal mucosal enhancement along the palatine tonsils, within the oropharynx, within the vallecular and within the supraglottic larynx. There is also prominent abnormal mucosal enhancement within the hypopharynx and upper cervical esophagus. There is prominent mucosal/submucosal edema affecting the palatine tonsils, oropharynx, epiglottis and supraglottic larynx. No evidence of soft tissue abscess. No retropharyngeal collection Salivary glands: No inflammation, mass, or stone. Thyroid: Unremarkable. Lymph nodes: Mildly enlarged right level II lymph node measuring 12 mm in short axis (series 11, image 27). No pathologically enlarged cervical chain lymph nodes identified elsewhere within the neck. Vascular: The major vascular structures of the neck are patent. Calcified atherosclerotic plaque within the visualized aortic arch, proximal major branch vessels of the neck and carotid bifurcations.  Aberrant right subclavian artery. Limited intracranial: No acute intracranial abnormality identified. Visualized orbits: Excluded from the field of view. Mastoids and visualized paranasal sinuses: No significant paranasal sinus disease or mastoid effusion at  the imaged levels. Skeleton: Cervical spondylosis with multilevel disc space narrowing, posterior disc osteophytes, uncovertebral and facet hypertrophy. No high-grade bony spinal canal stenosis. Upper chest: 6 mm ground-glass nodule within the left lung apex. These results were called by telephone at the time of interpretation on 06/07/2020 at 1:48 pm to provider [facility] , who verbally acknowledged these results. IMPRESSION: Imaging findings as described and most consistent with pharyngitis and supraglottitis as well as upper cervical esophagitis. There is prominent mucosal/submucosal edema affecting the palatine tonsils, oropharynx, epiglottis and supraglottic larynx. Resultant severe airway effacement at the level of the supraglottic larynx. No abscess or retropharyngeal collection is demonstrated. Mildly prominent right level II lymph node, possibly reactive. 6 mm ground-glass nodule within the left lung apex. Initial follow-up with CT at 6-12 months is recommended to confirm persistence. If persistent, repeat CT is recommended every 2 years until 5 years of stability has been established. This recommendation follows the consensus statement: Guidelines for Management of Incidental Pulmonary Nodules Detected on CT Images: From the Fleischner Society 2017; Radiology 2017; 284:228-243. Incidentally noted aberrant right subclavian artery. Electronically Signed   By: Kellie Simmering DO   On: 06/24/2020 13:59   DG CHEST PORT 1 VIEW  Result Date: Jun 26, 2020 CLINICAL DATA:  Intubated EXAM: PORTABLE CHEST 1 VIEW COMPARISON:  06/09/2020, 06/08/2020, 06/07/2020, 06/06/2020 FINDINGS: Endotracheal tube tip is about 1.3 cm superior to the carina. Right-sided  central venous catheter tip over the cavoatrial region. Esophageal tube tip is below the diaphragm. Slight worsening of hazy airspace disease at the right base. Stable dense consolidation in the left lung base with probable pleural effusion. Stable cardiomediastinal silhouette with aortic atherosclerosis. No pneumothorax. IMPRESSION: 1. Tip of the endotracheal tube is partially obscured by feeding tube. The tip appears to be about 1.3 cm superior to carina. 2. Slight worsening of hazy airspace disease at the right base. 3. Cardiomegaly, left pleural effusion and left basilar dense consolidation are otherwise unchanged. Electronically Signed   By: Donavan Foil M.D.   On: 06/26/20 00:03   DG CHEST PORT 1 VIEW  Result Date: 06/09/2020 CLINICAL DATA:  Respiratory failure EXAM: PORTABLE CHEST 1 VIEW COMPARISON:  06/08/2020, 06/07/2020, 06/06/2020 FINDINGS: Endotracheal tube tip about 15 mm superior to the carina. Right-sided central venous catheter tip at the cavoatrial junction. Esophageal tube tip is below the diaphragm. Stable cardiomegaly and central vascular congestion. Persistent consolidation at the left lung base with possible effusion. Slight increase in hazy right infrahilar opacity. No pneumothorax IMPRESSION: 1. Endotracheal tube tip about 15 mm superior to the carina. 2. Cardiomegaly with vascular congestion. Persistent consolidation at the left lung base with possible small left effusion. Slight increase in hazy airspace opacity at the right lung base. Electronically Signed   By: Donavan Foil M.D.   On: 06/09/2020 03:32   DG Chest Port 1 View  Result Date: 06/08/2020 CLINICAL DATA:  Respiratory failure. EXAM: PORTABLE CHEST 1 VIEW COMPARISON:  One-view chest x-ray 06/07/2020 FINDINGS: Heart is enlarged, exaggerated by low lung volumes. Endotracheal tube feeding tube, and right IJ line are stable. Atherosclerotic changes are noted at the aortic arch. Retrocardiac opacity is stable. IMPRESSION: 1.  Stable cardiomegaly without failure. 2. Stable retrocardiac opacity. This likely reflects atelectasis. Infection is not excluded. Electronically Signed   By: San Morelle M.D.   On: 06/08/2020 05:56   DG Chest Port 1 View  Result Date: 06/07/2020 CLINICAL DATA:  Pneumonia. EXAM: PORTABLE CHEST 1 VIEW COMPARISON:  06/06/2020 FINDINGS: The endotracheal tube,  feeding tube and right IJ catheters are stable. Stable mild cardiac enlargement and tortuous calcified thoracic aorta. Persistent left basilar process likely a combination of infiltrate, atelectasis and effusion. The right lung remains clear. IMPRESSION: 1. Stable support apparatus. 2. Persistent left basilar process. Electronically Signed   By: Marijo Sanes M.D.   On: 06/07/2020 07:32   DG CHEST PORT 1 VIEW  Result Date: 06/06/2020 CLINICAL DATA:  Initial evaluation for central line placement. EXAM: PORTABLE CHEST 1 VIEW COMPARISON:  Prior radiograph from earlier the same day. FINDINGS: Endotracheal tube in place with tip positioned 5.9 cm above the carina. Feeding tube courses into the abdomen. Interval placement of a right IJ approach central venous catheter with tip overlying the cavoatrial junction. Cardiomegaly stable. Mediastinal silhouette unchanged and remains within normal limits. Aortic atherosclerosis. Lungs mildly hypoinflated. Small left pleural effusion. Associated left basilar opacity could reflect atelectasis or infiltrate. Lungs otherwise clear. No pneumothorax. Osseous structures are unchanged. IMPRESSION: 1. Interval placement of right IJ approach central venous catheter with tip overlying the cavoatrial junction. No pneumothorax. 2. Remaining support apparatus in satisfactory position. 3. Small left pleural effusion with associated left basilar opacity, which may reflect atelectasis or infiltrate. Electronically Signed   By: Jeannine Boga M.D.   On: 06/06/2020 21:02   DG Chest Port 1 View  Result Date:  06/06/2020 CLINICAL DATA:  74 year old female status post intubation. EXAM: PORTABLE CHEST 1 VIEW COMPARISON:  The earlier radiograph dated 06/06/2020. FINDINGS: An endotracheal tube is noted with tip at the level of the carina tilting towards the right mainstem bronchus. Recommend retraction of the tube by approximately 4-5 cm. Enteric tube extends below the diaphragm with tip beyond the inferior margin of the image. Bilateral peripheral and subpleural densities, progressed since the prior radiograph. A small left pleural effusion may be present. There is no pneumothorax. Stable borderline cardiomegaly. Atherosclerotic calcification of the aorta. The aorta is tortuous. No acute osseous pathology. IMPRESSION: 1. Endotracheal tube with tip at the level of the carina tilting towards the right mainstem bronchus. Recommend retraction of the tube by approximately 4-5 cm. 2. Bilateral peripheral and subpleural densities, progressed since the prior radiograph. These results were called by telephone at the time of interpretation on 06/06/2020 at 7:20 pm to nurse Sheppard Coil, who verbally acknowledged these results. Electronically Signed   By: Anner Crete M.D.   On: 06/06/2020 19:23   DG Chest Port 1 View  Result Date: 06/06/2020 CLINICAL DATA:  Acute respiratory disease. EXAM: PORTABLE CHEST 1 VIEW COMPARISON:  One-view chest x-ray 06/05/2020 FINDINGS: Heart is enlarged. Atherosclerotic changes are present at the aortic arch. Lung volumes are low. Progressive bibasilar airspace opacities are noted. Facet effusions are suspected. IMPRESSION: 1. Cardiomegaly without failure. 2. Progressive bibasilar airspace disease. Disease progression and new effusions is concerning for pneumonia Electronically Signed   By: San Morelle M.D.   On: 06/06/2020 06:04   DG CHEST PORT 1 VIEW  Result Date: 06/05/2020 CLINICAL DATA:  Respiratory distress EXAM: PORTABLE CHEST 1 VIEW COMPARISON:  06/22/2020 chest radiograph. FINDINGS:  Enteric tube enters stomach with the tip not seen on this image. Stable cardiomediastinal silhouette with top-normal heart size. No pneumothorax. No pleural effusion. Patchy opacities at the left greater than right lung bases. No pulmonary edema. IMPRESSION: Patchy opacities at the left greater than right lung bases, suspicious for aspiration or pneumonia. Chest radiograph follow-up advised. Electronically Signed   By: Ilona Sorrel M.D.   On: 06/05/2020 13:24   DG CHEST  PORT 1 VIEW  Result Date: 06/08/2020 CLINICAL DATA:  Cough. EXAM: PORTABLE CHEST 1 VIEW COMPARISON:  04/11/2020 FINDINGS: The cardiac silhouette, mediastinal and hilar contours are stable. Moderate tortuosity and calcification of the thoracic aorta. The lungs are clear of an acute process. No pleural effusions or pulmonary infiltrates. No worrisome pulmonary lesions. The bony thorax is intact. IMPRESSION: No acute cardiopulmonary findings. Electronically Signed   By: Marijo Sanes M.D.   On: 06/21/2020 18:07    Microbiology Recent Results (from the past 240 hour(s))  Culture, respiratory     Status: None   Collection Time: 06/06/20 11:56 AM   Specimen: Tracheal Aspirate  Result Value Ref Range Status   Specimen Description TRACHEAL ASPIRATE  Final   Special Requests NONE  Final   Gram Stain   Final    ABUNDANT WBC PRESENT,BOTH PMN AND MONONUCLEAR FEW GRAM NEGATIVE RODS MODERATE GRAM VARIABLE ROD RARE GRAM POSITIVE COCCI    Culture   Final    RARE Normal respiratory flora-no Staph aureus or Pseudomonas seen Performed at [facility], [address], [place], [place] [address]    Report Status 06/08/2020 FINAL  Final  Culture, blood (Routine X 2) w Reflex to ID Panel     Status: None   Collection Time: 06/06/20 12:34 PM   Specimen: BLOOD LEFT HAND  Result Value Ref Range Status   Specimen Description BLOOD LEFT HAND  Final   Special Requests   Final    BOTTLES DRAWN AEROBIC ONLY Blood Culture results may not be  optimal due to an inadequate volume of blood received in culture bottles   Culture   Final    NO GROWTH 5 DAYS Performed at [facility], [place] [address] [place], [place] [address]    Report Status 06/11/2020 FINAL  Final  Culture, blood (Routine X 2) w Reflex to ID Panel     Status: None   Collection Time: 06/06/20 12:34 PM   Specimen: BLOOD LEFT HAND  Result Value Ref Range Status   Specimen Description BLOOD LEFT HAND  Final   Special Requests   Final    BOTTLES DRAWN AEROBIC ONLY Blood Culture results may not be optimal due to an inadequate volume of blood received in culture bottles   Culture   Final    NO GROWTH 5 DAYS Performed at [facility], [place] [address] [place], [place] [address]    Report Status 06/11/2020 FINAL  Final  MRSA PCR Screening     Status: None   Collection Time: 06/06/20 10:12 PM   Specimen: Nasopharyngeal  Result Value Ref Range Status   MRSA by PCR NEGATIVE NEGATIVE Final    Comment:        The GeneXpert MRSA Assay (FDA approved for NASAL specimens only), is one component of a comprehensive MRSA colonization surveillance program. It is not intended to diagnose MRSA infection nor to guide or monitor treatment for MRSA infections. Performed at [facility], [place] [address], [place],  [address]     Lab Basic Metabolic Panel: Recent Labs  Lab 06/09/20 [phone] 06/09/20 0343 06/10/20 0215 06/10/20 0414 06/11/20 0256 06/11/20 0335 28-Jun-2020 0148 June 28, 2020 0157 Jun 28, 2020 0435  NA 131*   < > 132*   < > 137 133* 133* 135 133*  K 3.8   < > 3.7   < > 2.9* 3.0* 3.8 3.8 4.1  CL 102  --  100  --   --  101  101  --   --   CO2 21*  --  21*  --   --  22 17*  --   --   GLUCOSE 114*  --  206*  --   --  132* 141*  --   --   BUN 32*  --  38*  --   --  43* 43*  --   --   CREATININE 1.14*  --  1.41*  --   --  1.68* 1.97*  --   --   CALCIUM 6.4*  --  7.1*  --   --  7.1* 7.6*  --   --    < > = values in this interval not displayed.    Liver Function Tests: Recent Labs  Lab 06/09/20 1130  AST 147*  ALT 69*  ALKPHOS 67  BILITOT 0.6  PROT 4.6*  ALBUMIN 1.1*   Recent Labs  Lab 06/10/20 1154  LIPASE 52*  AMYLASE 63   No results for input(s): AMMONIA in the last 168 hours. CBC: Recent Labs  Lab 06/09/20 0339 06/09/20 0343 06/10/20 0215 06/10/20 0414 06/11/20 0335 06/11/20 0335 07/06/2020 0148 07-06-20 0157 2020/07/06 0435 2020-07-06 1033 July 06, 2020 1554  WBC 6.6  --  8.7  --  7.3  --  5.7  --   --  14.3*  --   NEUTROABS 1.3*  --  1.8  --  1.6*  --  1.6*  --   --   --   --   HGB 8.2*   < > 8.5*   < > 7.8*   < > 6.7* 6.5* 6.5* 8.7* 8.8*  HCT 25.5*   < > 26.0*   < > 23.8*   < > 20.3* 19.0* 19.0* 27.7* 28.2*  MCV 100.0  --  98.5  --  96.7  --  97.1  --   --  100.7*  --   PLT 120*  --  135*  --  108*  --  84*  --   --  81*  --    < > = values in this interval not displayed.   Cardiac Enzymes: No results for input(s): CKTOTAL, CKMB, CKMBINDEX, TROPONINI in the last 168 hours. Sepsis Labs: Recent Labs  Lab 06/10/20 0215 06/11/20 0335 Jul 06, 2020 0148 06-Jul-2020 1033  WBC 8.7 7.3 5.7 14.3*    Procedures/Operations     Katherine Wang 06/15/2020, 4:42 PM

## 2020-06-29 NOTE — Progress Notes (Signed)
PEEP increased to 12 per Dr. Ander Slade, VO. RT notified. Will continue to monitor.

## 2020-06-29 NOTE — Progress Notes (Signed)
Recruitment maneuver done on pt to help with oxygenation, unsuccessful.

## 2020-06-29 NOTE — Progress Notes (Signed)
Compassionate withdrawal of care will be initiated

## 2020-06-29 NOTE — Progress Notes (Signed)
Worsening status  Increasing pressor requirement  Increasing oxygen requirements and PEEP increasing  Did not diurese with Lasix  Will give albumin to draw fluid from third spacing and start on Lasix drip  Overall worsening status

## 2020-06-29 NOTE — Progress Notes (Signed)
Updated Dr Gillermina Phy of patient condition. HR 60-70s and BP less labile at this time. Reviewed labs and ABG results. Hgb 6.7 without signs of bleeding. Will continue the heparin, transfuse 1 unit PRBC over 4 hours and give another push of lasix.

## 2020-06-29 DEATH — deceased

## 2020-07-07 ENCOUNTER — Institutional Professional Consult (permissible substitution): Payer: Medicare Other | Admitting: Internal Medicine

## 2022-04-20 IMAGING — DX DG THORACIC SPINE 2V
3 series · 3 of 3 positions shown · non-contrast
Comparison: 05/12/2010, 05/13/2020

CLINICAL DATA: Right mid spine pain, upper back pain for 1 month

EXAM:
THORACIC SPINE 2 VIEWS

[t-spine ap]
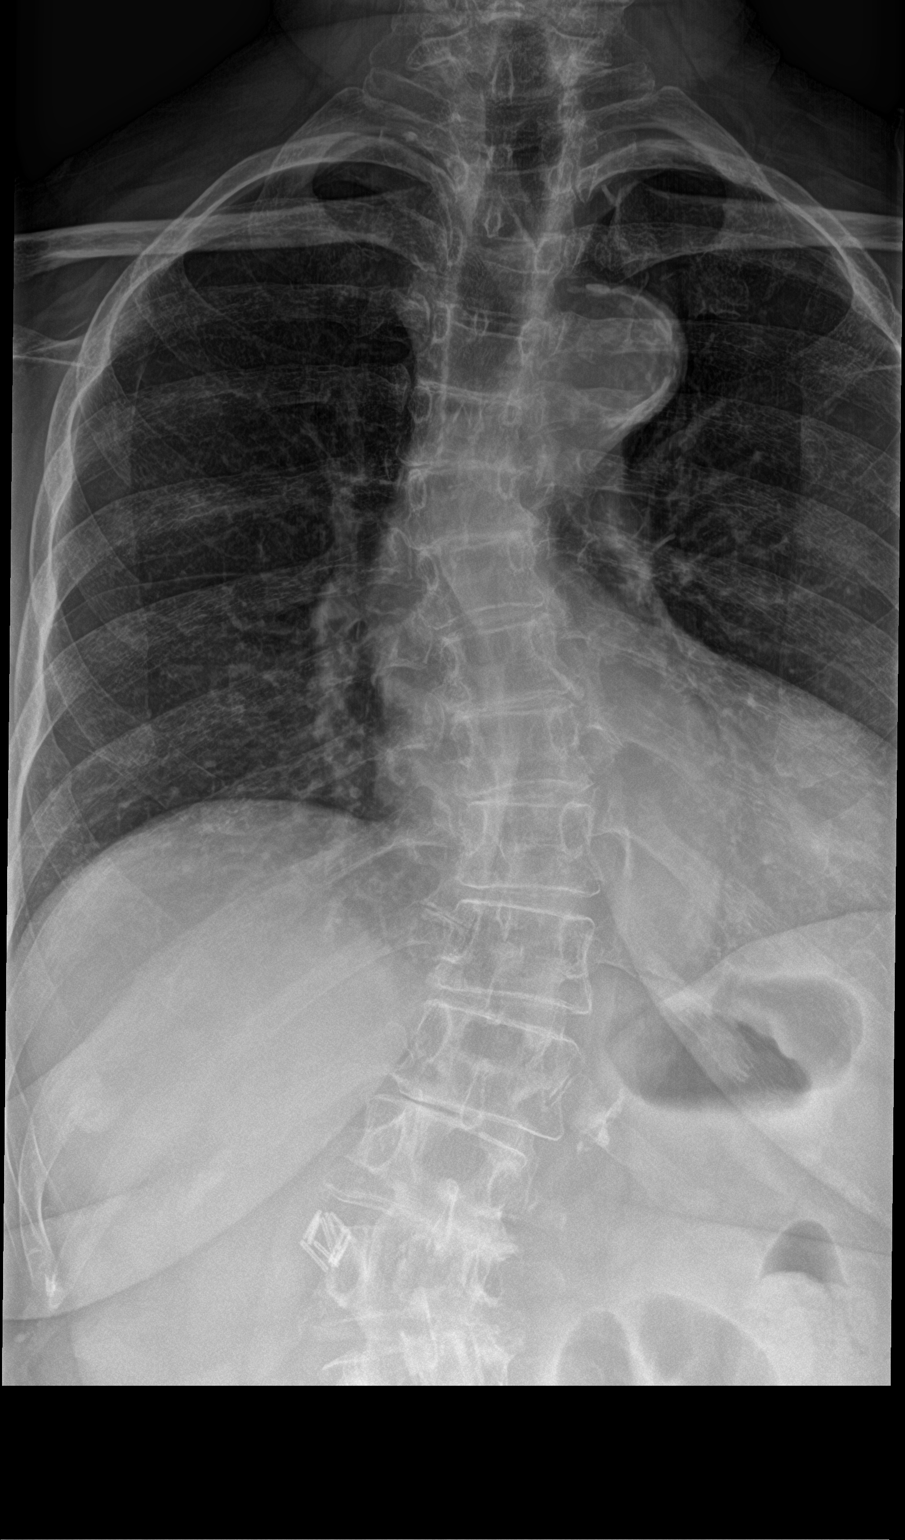

[t-spine lat]
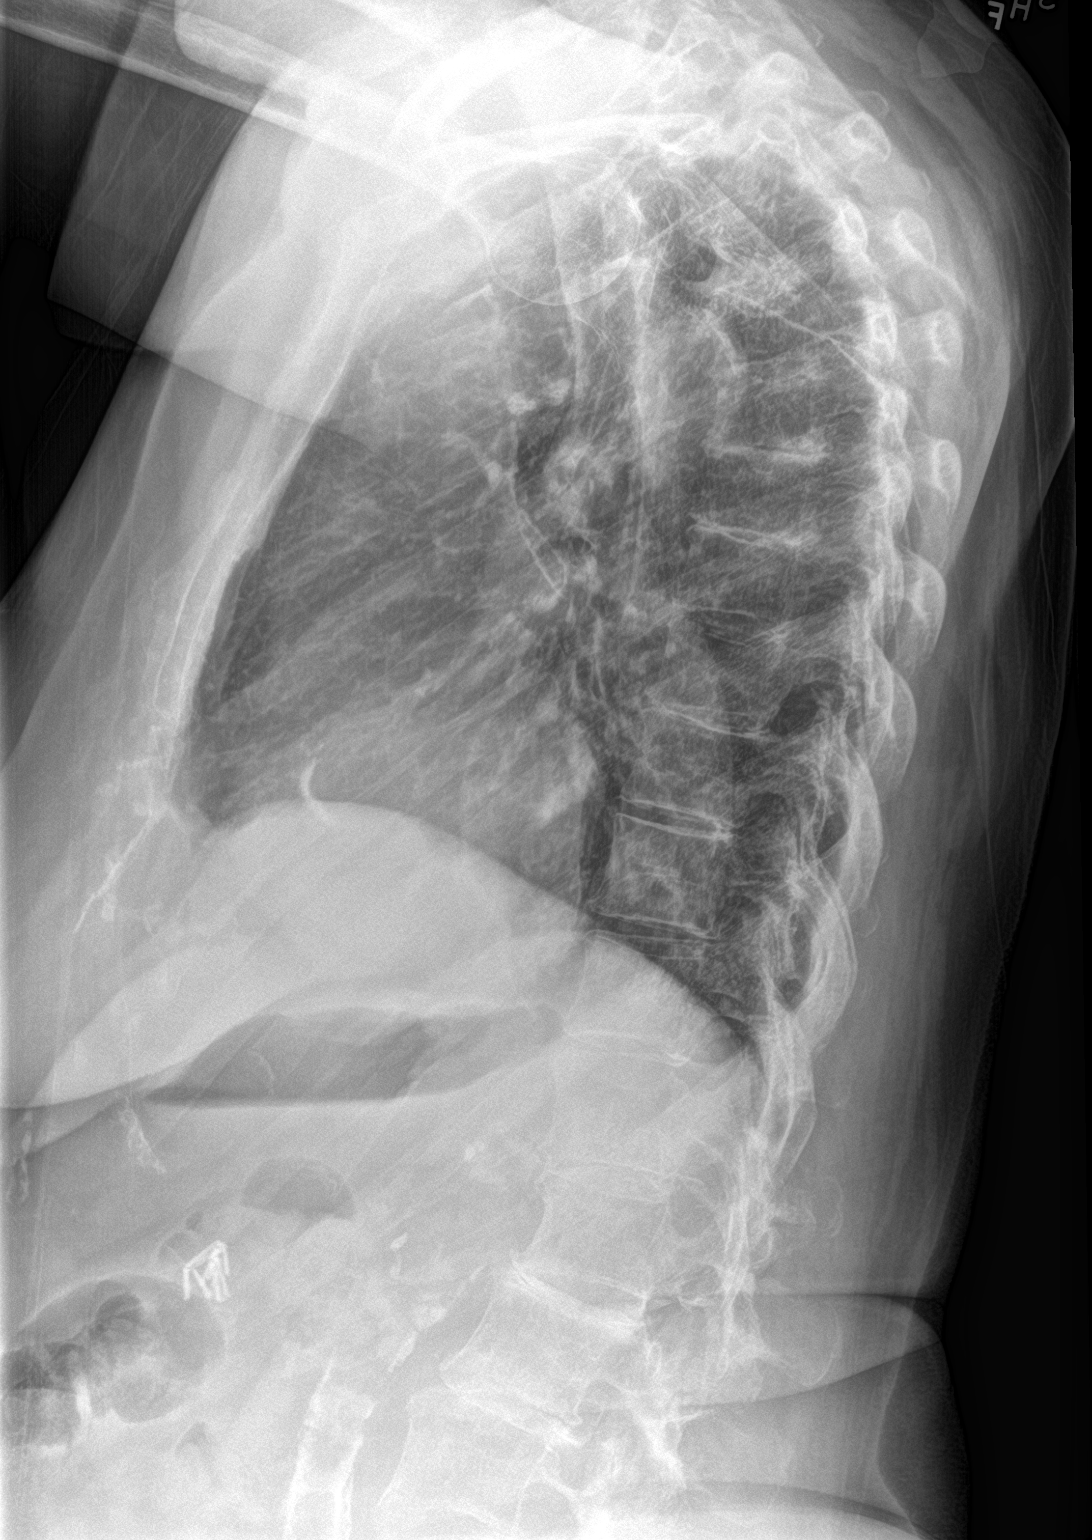

[t-spine swimmers]
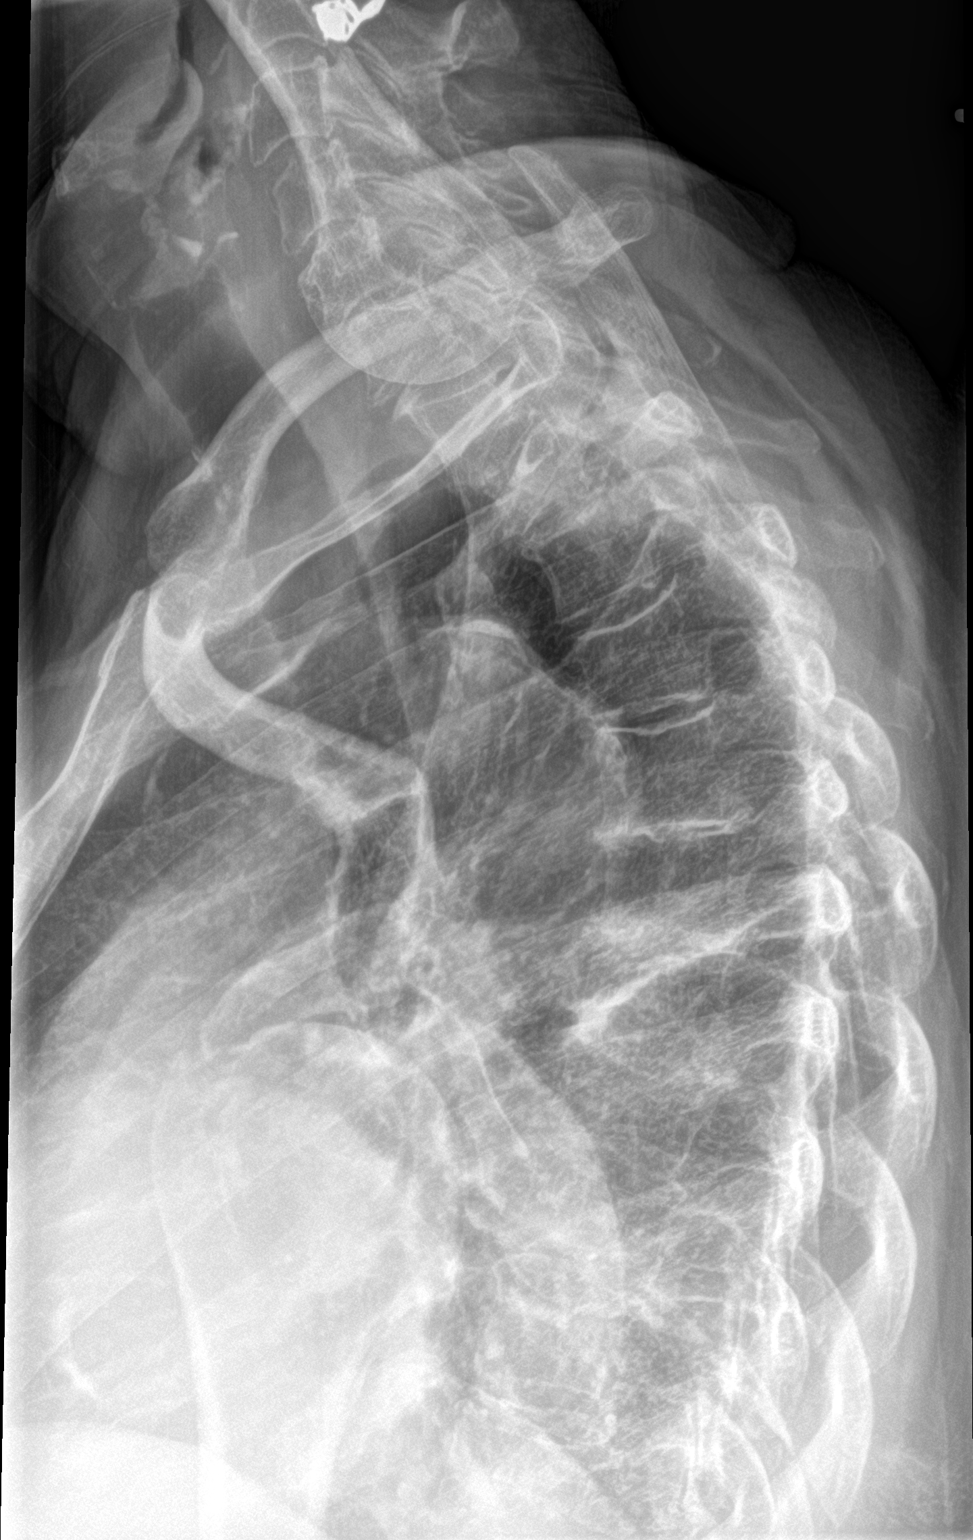

[3 of 3 positions shown; findings below may reference images not displayed]

FINDINGS: Frontal and lateral views of the thoracic spine demonstrate stable S
shaped scoliosis, right convex in the midthoracic spine and left
convex at the thoracolumbar junction. There are no acute displaced
fractures. Multilevel spondylosis again noted, greatest at the T4-5,
T5-6, and T6-7 levels. Paraspinal soft tissues are unremarkable.
Prominent atherosclerosis of the thoracic aorta.
IMPRESSION: 1. S-shaped scoliosis and upper thoracic spondylosis. No acute
fracture.

## 2022-04-20 IMAGING — DX DG LUMBAR SPINE COMPLETE 4+V
5 series · 5 of 5 positions shown · non-contrast
Comparison: 05/12/2010

CLINICAL DATA: Right mid lower back pain for 1 month, no history of
trauma

EXAM:
LUMBAR SPINE - COMPLETE 4+ VIEW

[l-spine ap]
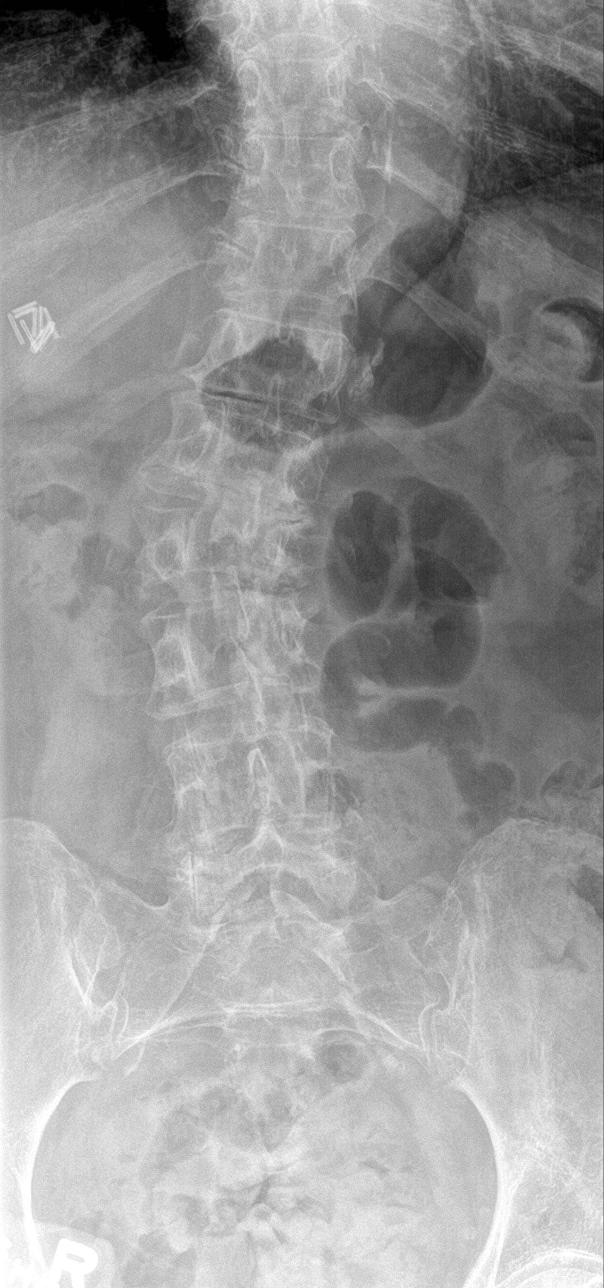

[l-spine obl (1 of 2)]
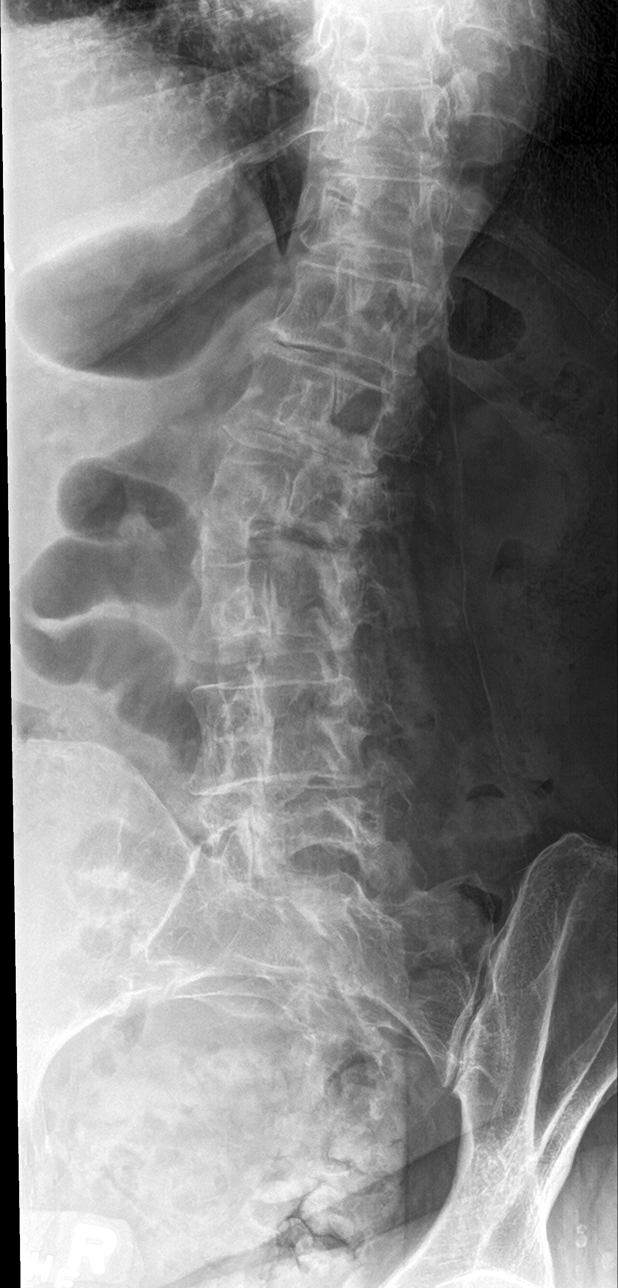

[l-spine obl (2 of 2)]
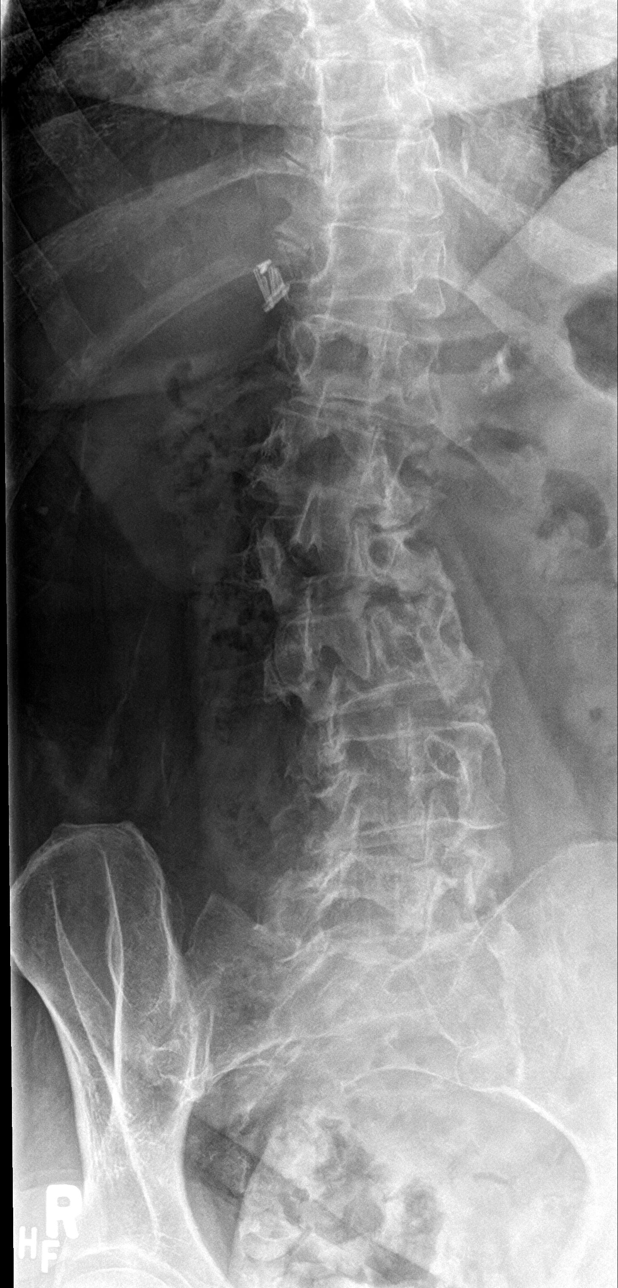

[l-spine lat]
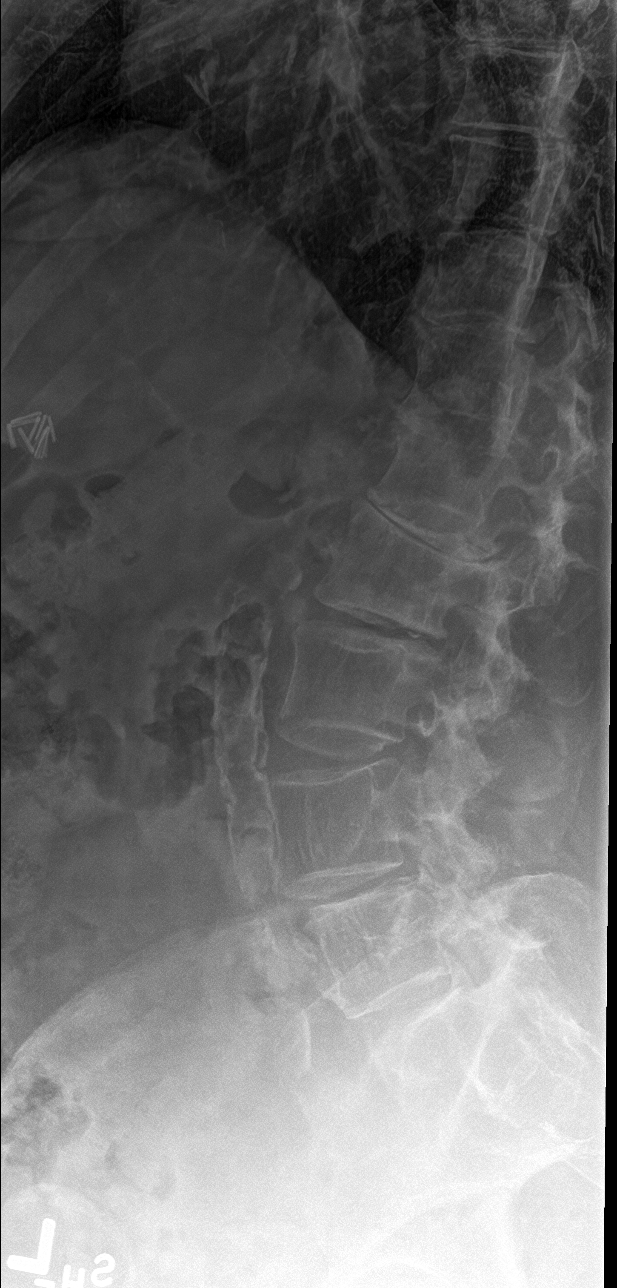

[l-spine spot]
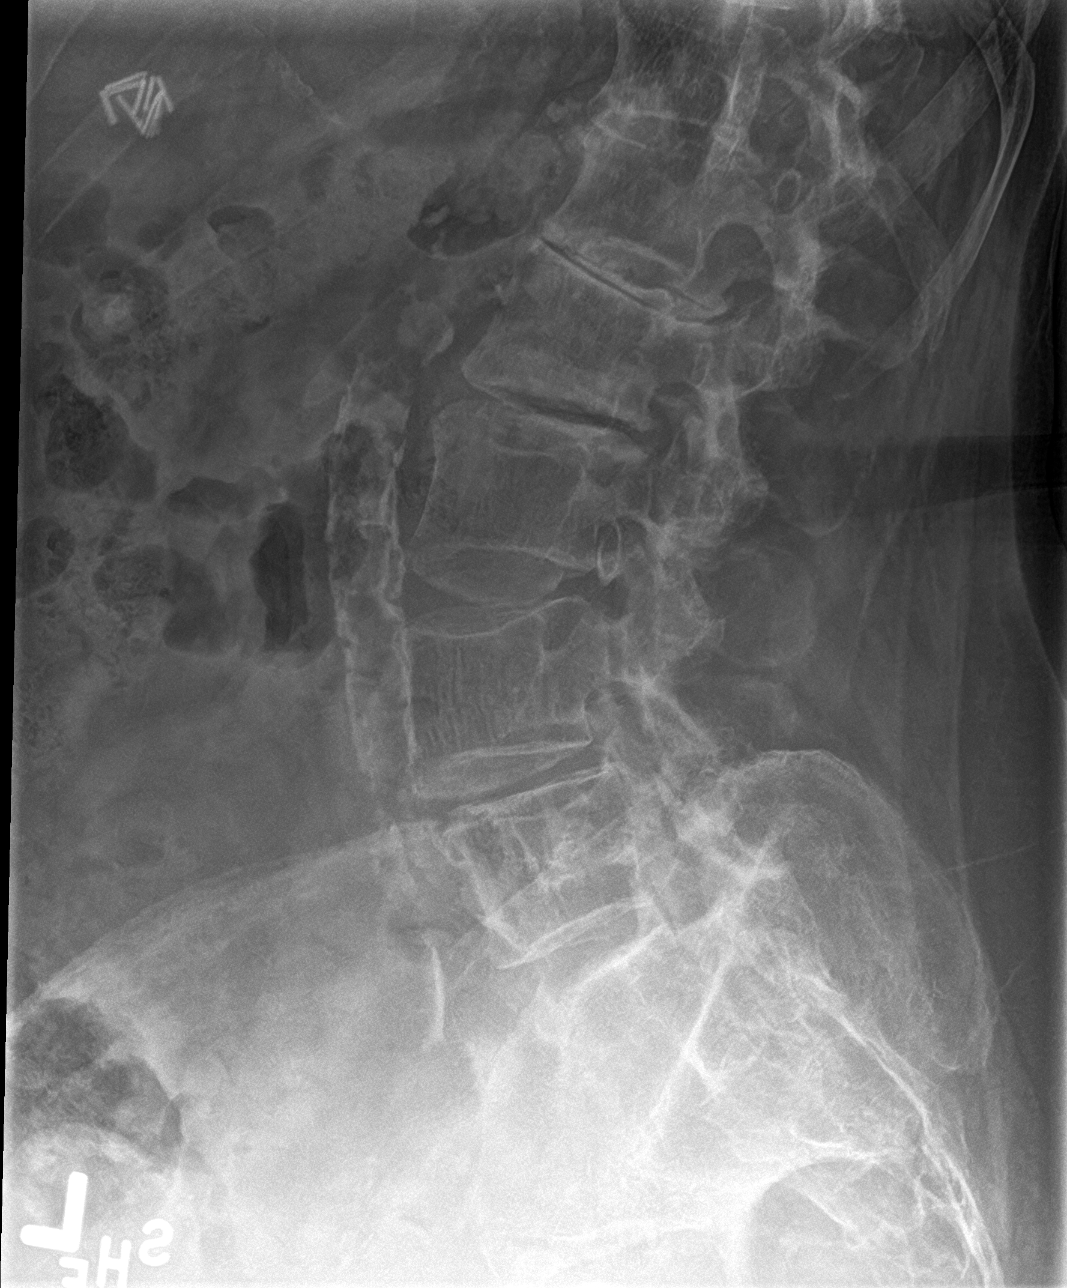

[5 of 5 positions shown; findings below may reference images not displayed]

FINDINGS: Frontal, bilateral oblique, and lateral views of the lumbar spine
are obtained. Five non-rib-bearing lumbar type vertebral bodies are
identified, with right convex scoliosis again seen centered at L2.
No acute displaced fracture. Multilevel spondylosis most pronounced
at L1-2 and L2-3, which has progressed since prior study. There is
diffuse facet hypertrophy, which has progressed since prior study.
Sacroiliac joints are normal. Diffuse vascular calcifications are
seen throughout the aorta.
IMPRESSION: 1. Progressive multilevel spondylosis and facet hypertrophy.
2. Continued right convex scoliosis.
3. No acute fracture.
